# Patient Record
Sex: Female | Born: 1950 | Race: White | Hispanic: No | Marital: Single | Smoking: Current every day smoker
Health system: Southern US, Community
[De-identification: ages and names within clinical notes are randomized; demographics above are authoritative.]

## PROBLEM LIST (undated history)

## (undated) DIAGNOSIS — E785 Hyperlipidemia, unspecified: Secondary | ICD-10-CM

## (undated) DIAGNOSIS — C801 Malignant (primary) neoplasm, unspecified: Secondary | ICD-10-CM

## (undated) DIAGNOSIS — M1990 Unspecified osteoarthritis, unspecified site: Secondary | ICD-10-CM

## (undated) DIAGNOSIS — M8580 Other specified disorders of bone density and structure, unspecified site: Secondary | ICD-10-CM

## (undated) DIAGNOSIS — Z9889 Other specified postprocedural states: Secondary | ICD-10-CM

## (undated) DIAGNOSIS — T8859XA Other complications of anesthesia, initial encounter: Secondary | ICD-10-CM

## (undated) DIAGNOSIS — I10 Essential (primary) hypertension: Secondary | ICD-10-CM

## (undated) DIAGNOSIS — R112 Nausea with vomiting, unspecified: Secondary | ICD-10-CM

## (undated) DIAGNOSIS — T7840XA Allergy, unspecified, initial encounter: Secondary | ICD-10-CM

## (undated) HISTORY — DX: Hyperlipidemia, unspecified: E78.5

## (undated) HISTORY — PX: TONSILLECTOMY: SUR1361

## (undated) HISTORY — PX: APPENDECTOMY: SHX54

## (undated) HISTORY — DX: Unspecified osteoarthritis, unspecified site: M19.90

## (undated) HISTORY — DX: Other specified disorders of bone density and structure, unspecified site: M85.80

## (undated) HISTORY — DX: Allergy, unspecified, initial encounter: T78.40XA

## (undated) HISTORY — PX: HERNIA REPAIR: SHX51

---

## 1999-03-23 ENCOUNTER — Ambulatory Visit (HOSPITAL_BASED_OUTPATIENT_CLINIC_OR_DEPARTMENT_OTHER): Admission: RE | Admit: 1999-03-23 | Discharge: 1999-03-23 | Payer: Self-pay | Admitting: General Surgery

## 2010-03-25 IMAGING — CR DG ABDOMEN ACUTE W/ 1V CHEST
3 series · 3 of 3 positions shown · non-contrast
Comparison: None.

CLINICAL DATA: Abdominal pain.

ACUTE ABDOMEN SERIES (ABDOMEN 2 VIEW & CHEST 1 VIEW)

[view not recorded (1 of 3)]
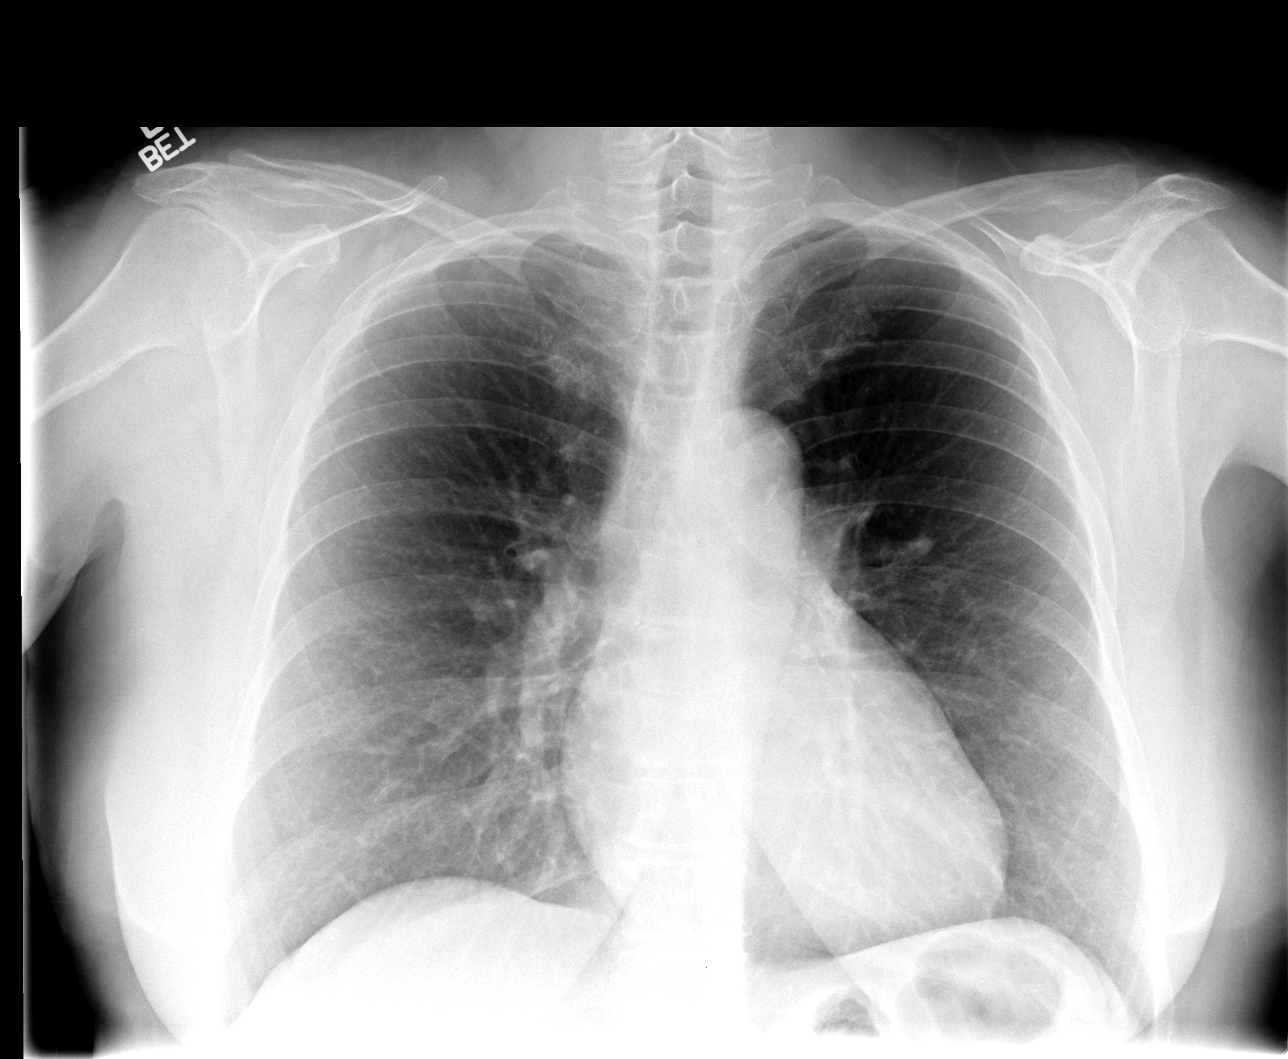

[view not recorded (2 of 3)]
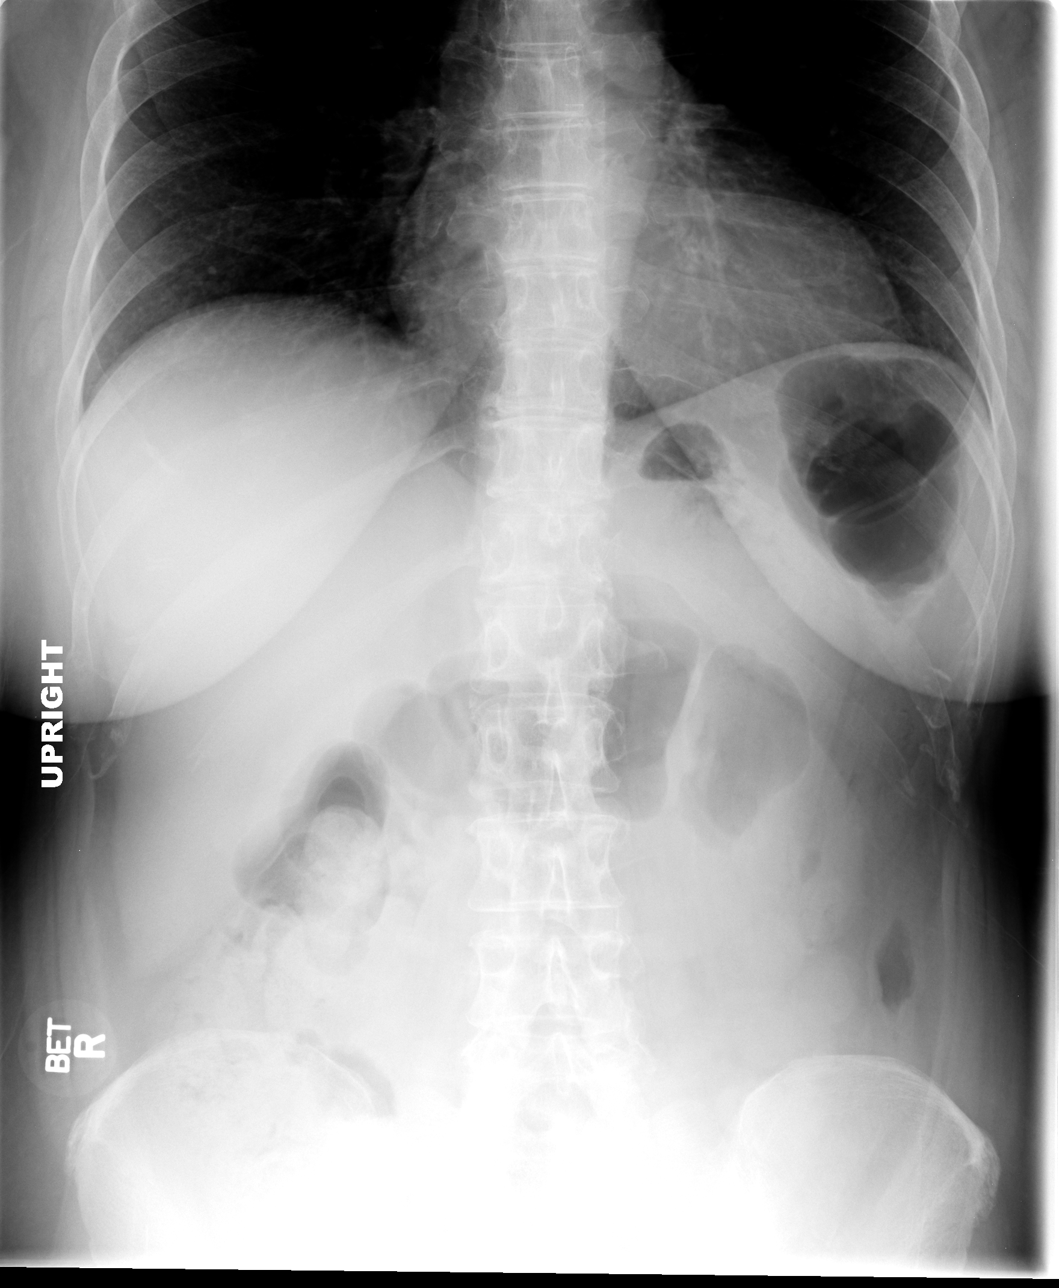

[view not recorded (3 of 3)]
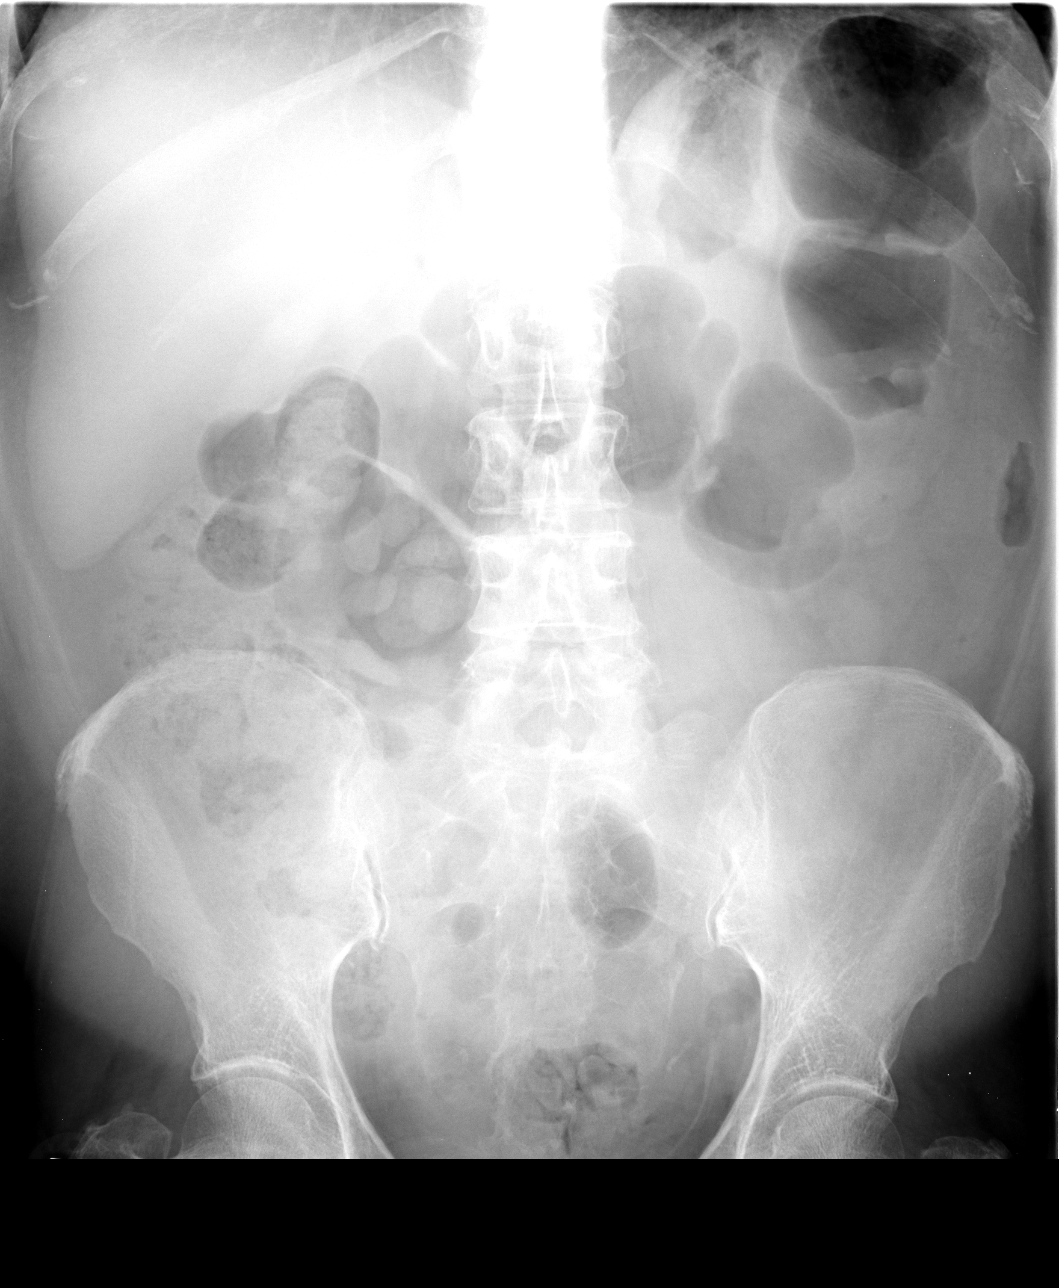

[3 of 3 positions shown; findings below may reference images not displayed]

FINDINGS: The cardiopericardial silhouette is normal in size.  The
lungs are clear.

There is mild gaseous distention of the transverse colon.  Stool
and gas can be seen throughout the colon to the level of the
rectum.  There is no evidence for obstruction or free air.  The
axial skeleton demonstrates mild degenerative changes in the lower
lumbar spine.
IMPRESSION: No acute abnormality.

## 2010-03-26 IMAGING — CR DG CHEST 1V
1 series · 1 of 1 positions shown · non-contrast
Comparison: PA view the chest from the acute abdominal series [3T]
hours on [DATE].

CLINICAL DATA: 58-year-old female preoperative study.

CHEST - 1 VIEW

[view not recorded]
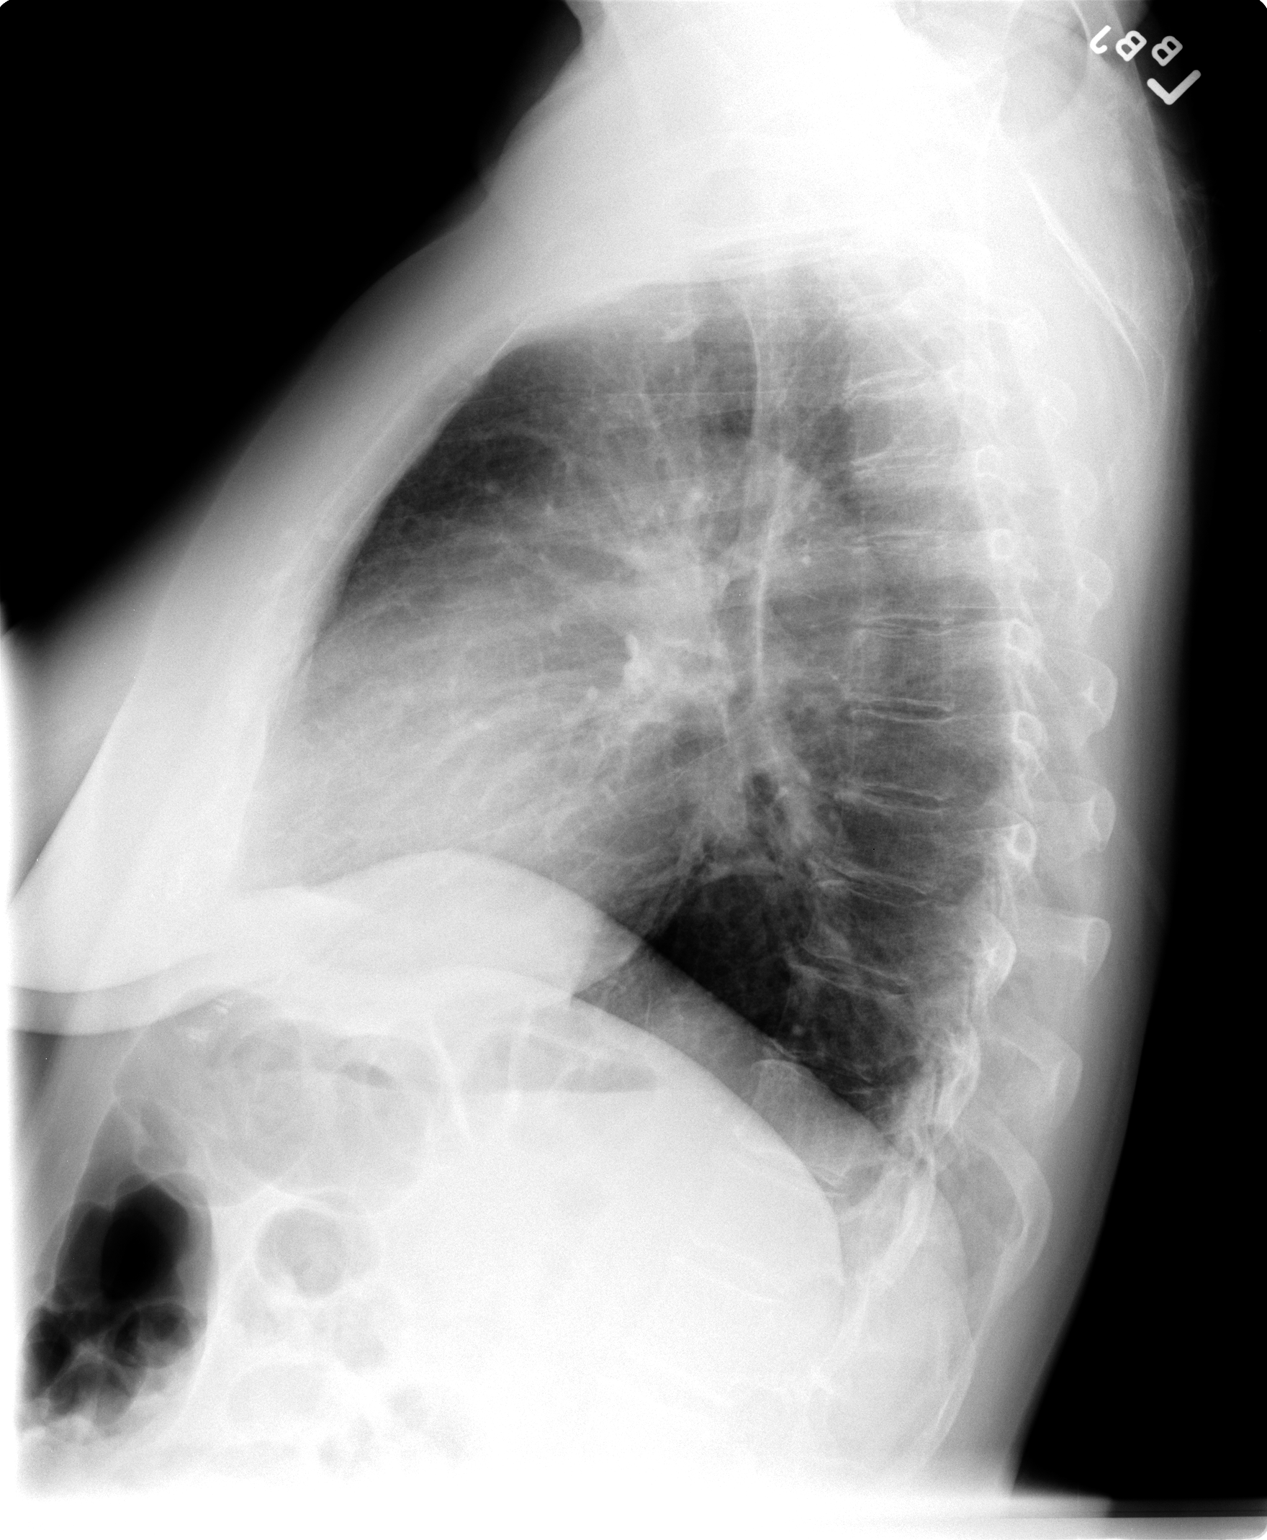

[1 of 1 positions shown; findings below may reference images not displayed]

FINDINGS: Cardiac size and mediastinal contours are within normal
limits.  Normal lung volumes. Visualized tracheal air column is
within normal limits.  The lungs are clear.  No pneumoperitoneum.
No acute osseous abnormality identified.
IMPRESSION: Negative, no acute cardiopulmonary abnormality.

## 2010-03-26 IMAGING — CT CT ABD-PELV W/ CM
2 of 5 series · 16 of 46 positions shown, 18 images · IV contrast (Omnipaque 300)
Comparison: Acute abdominal series [DATE].

CLINICAL DATA: Abdominal pain.

CT ABDOMEN AND PELVIS WITH CONTRAST
TECHNIQUE: Multidetector CT imaging of the abdomen and pelvis was
performed following the standard protocol during bolus
administration of intravenous contrast.
Contrast: 100 ml Omnipaque 300

[Series 2: abd_pel_with 5.0 b40f · axial · 0.74mm/px · z∈[-466,-70]mm · 13 of 91 slices shown, 15 images]
[im 6/91  soft-tissue]
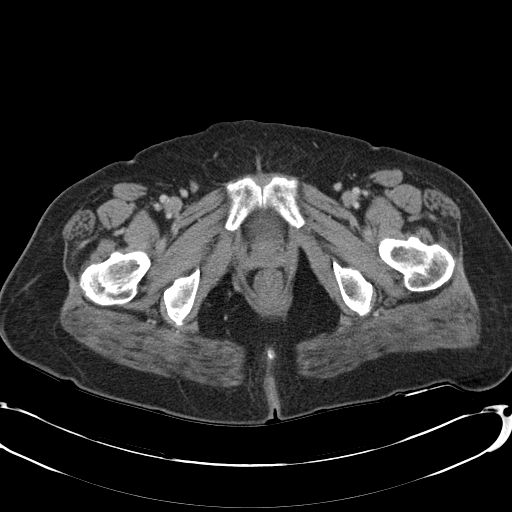
[im 6/91  bone]
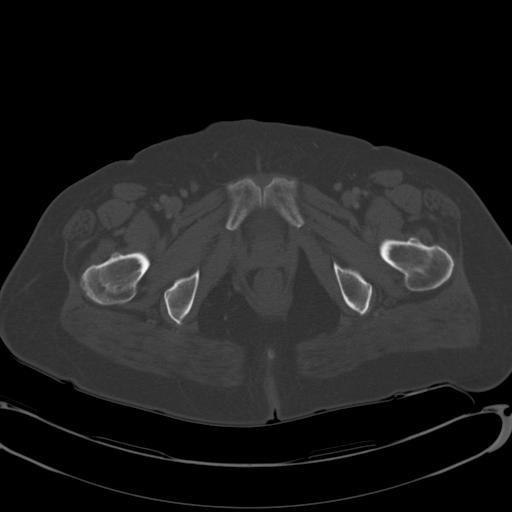
[im 11/91  soft-tissue]
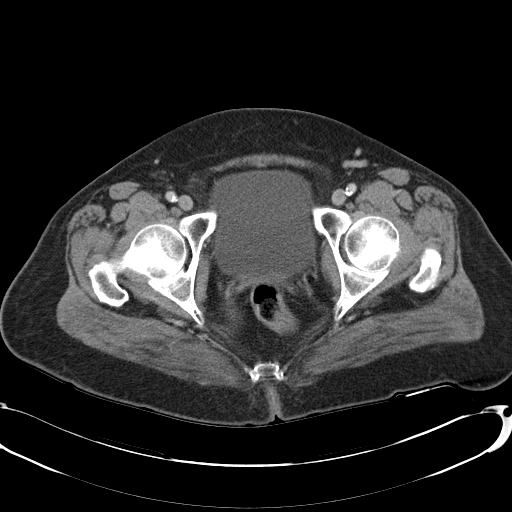
[im 22/91  soft-tissue]
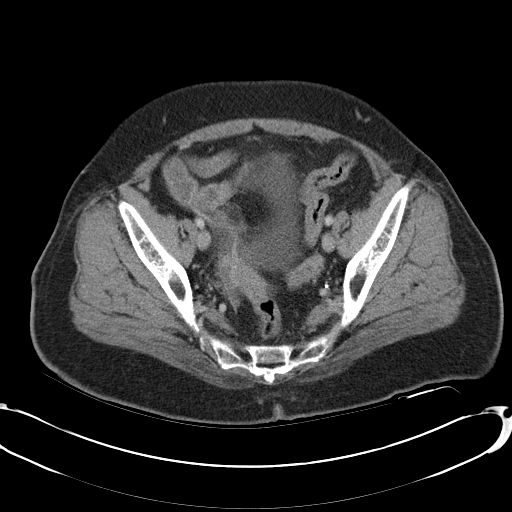
[im 27/91  soft-tissue]
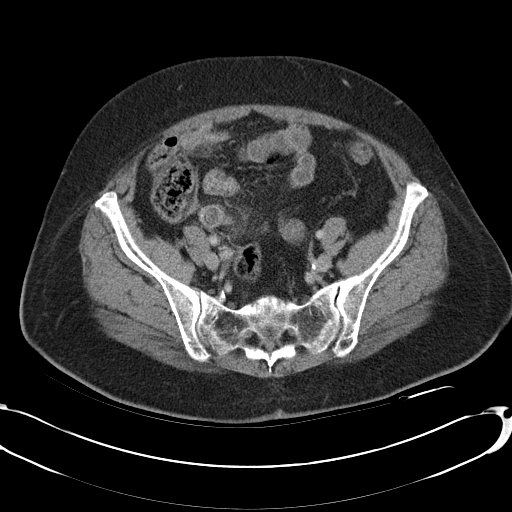
[im 32/91  soft-tissue]
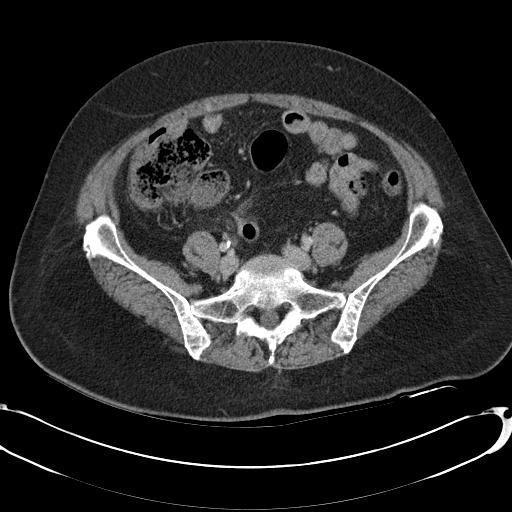
[im 38/91  soft-tissue]
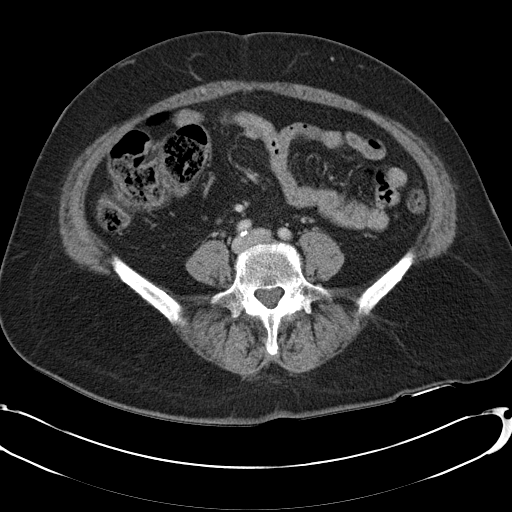
[im 48/91  soft-tissue]
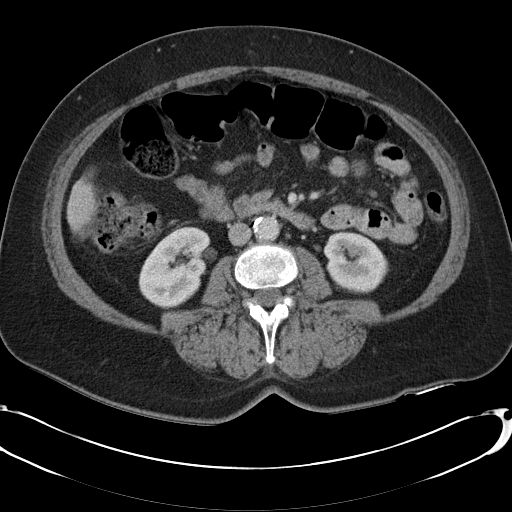
[im 53/91  soft-tissue]
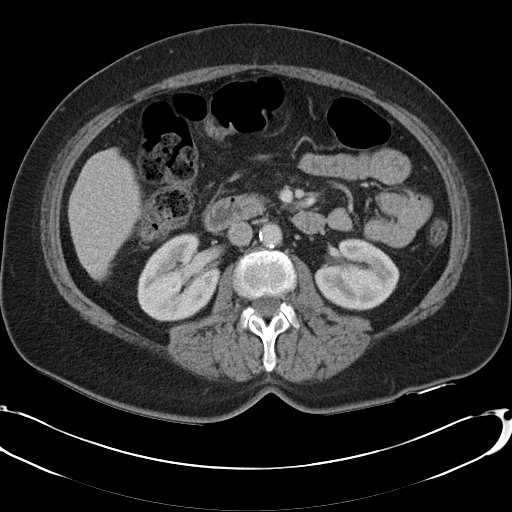
[im 59/91  soft-tissue]
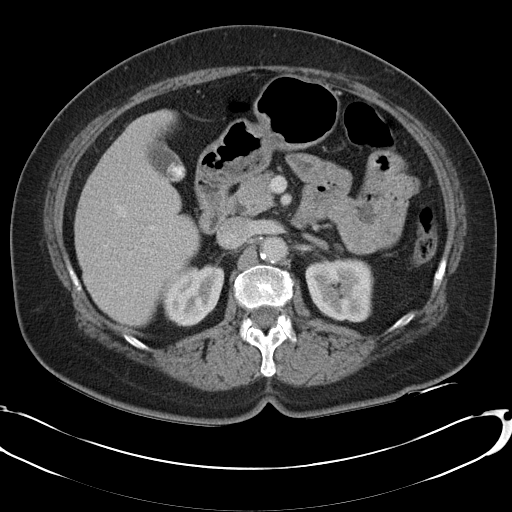
[im 59/91  bone]
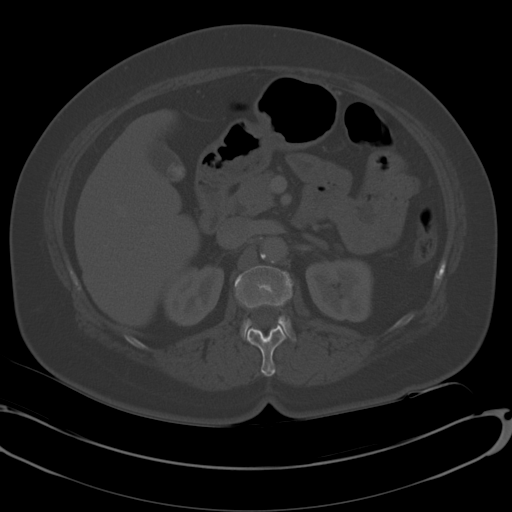
[im 64/91  soft-tissue]
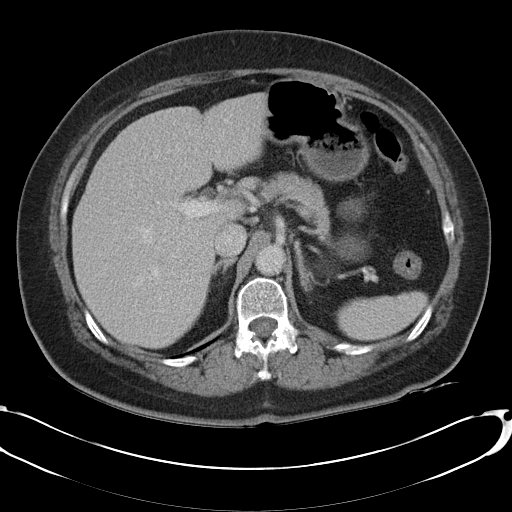
[im 69/91  soft-tissue]
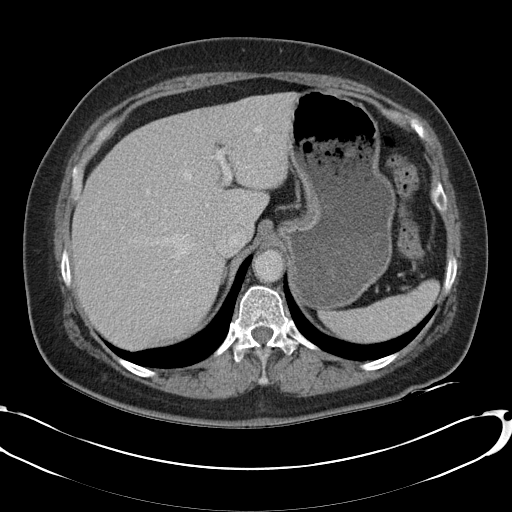
[im 80/91  soft-tissue]
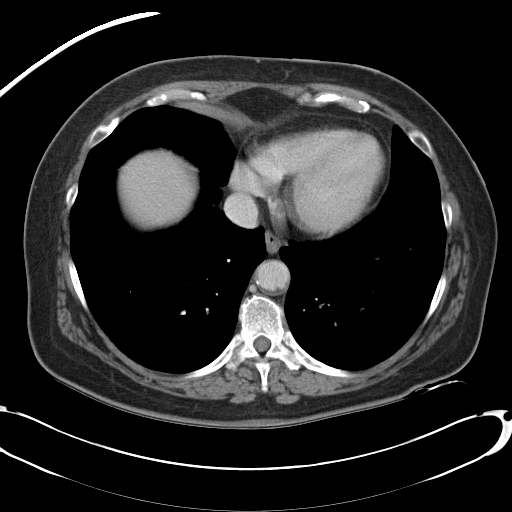
[im 85/91  soft-tissue]
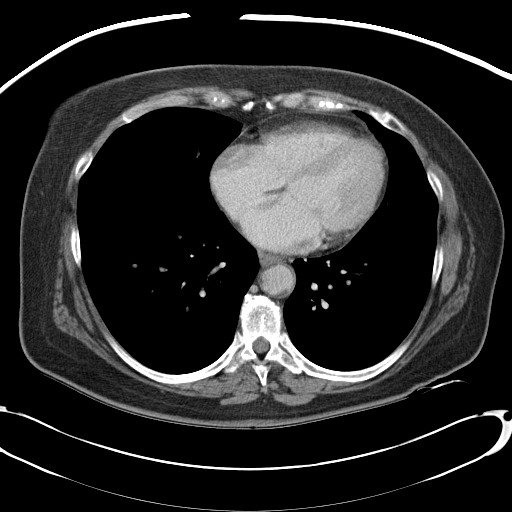

[Series 4: mpr cor post contrast (id) · coronal · 0.77mm/px · 3 of 88 slices shown]
[im 30/88  soft-tissue]
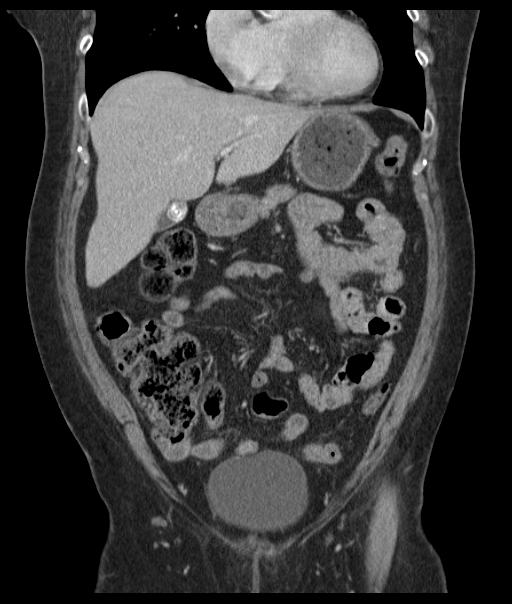
[im 39/88  soft-tissue]
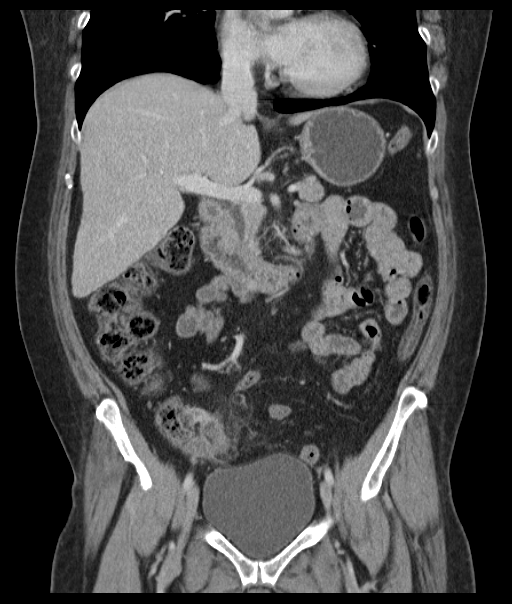
[im 49/88  soft-tissue]
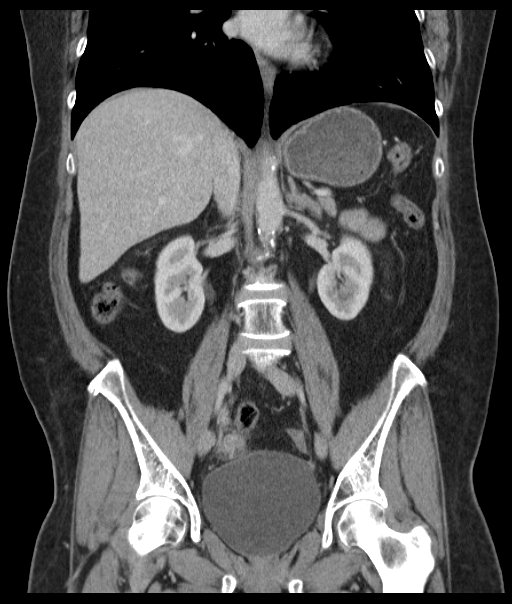

[16 of 46 positions shown; findings below may reference images not displayed]

FINDINGS: Mild dependent atelectasis is present bilaterally.  No
other focal nodule, mass, or airspace disease is present.  The
heart size is normal.  No significant pleural or pericardial
effusion is present.

The infused appearance of the liver and spleen is normal.  The
stomach, duodenum, and pancreas are within normal limits.  The
common bile duct is unremarkable.  A large gallstone at the neck of
the gallbladder measures 14 mm.  No inflammatory changes are
evident.  The adrenal glands and kidneys are normal bilaterally.
Atherosclerotic calcifications are noted within the aorta without
aneurysm.

The rectosigmoid colon is partially collapsed, but within normal
limits.  The transverse and ascending colon are normal.  The
appendix is markedly enlarged, measuring up to 16 mm.  A distal
appendicolith is present.  Periappendiceal inflammatory changes are
noted.  No free air or free fluid is present.  The patient is
status post hysterectomy.  The adnexa are normal for age.  The bone
windows are unremarkable.
IMPRESSION: 1.  Acute appendicitis with marked enlargement of the appendix and
periappendiceal inflammatory change.
2.  Atherosclerosis.
3.  Cholelithiasis without evidence for cholecystitis.

## 2010-10-28 ENCOUNTER — Observation Stay (HOSPITAL_COMMUNITY): Admission: EM | Admit: 2010-10-28 | Discharge: 2010-03-26 | Payer: Self-pay | Admitting: Emergency Medicine

## 2011-02-08 LAB — DIFFERENTIAL
Basophils Absolute: 0.1 10*3/uL (ref 0.0–0.1)
Basophils Relative: 1 % (ref 0–1)
Eosinophils Absolute: 0.1 10*3/uL (ref 0.0–0.7)
Eosinophils Relative: 1 % (ref 0–5)
Lymphocytes Relative: 27 % (ref 12–46)
Lymphs Abs: 3.4 10*3/uL (ref 0.7–4.0)
Monocytes Absolute: 1 10*3/uL (ref 0.1–1.0)
Monocytes Relative: 8 % (ref 3–12)
Neutro Abs: 8 10*3/uL — ABNORMAL HIGH (ref 1.7–7.7)
Neutrophils Relative %: 64 % (ref 43–77)

## 2011-02-08 LAB — COMPREHENSIVE METABOLIC PANEL
ALT: 12 U/L (ref 0–35)
AST: 12 U/L (ref 0–37)
Albumin: 3.8 g/dL (ref 3.5–5.2)
Alkaline Phosphatase: 94 U/L (ref 39–117)
BUN: 9 mg/dL (ref 6–23)
CO2: 28 mEq/L (ref 19–32)
Calcium: 9 mg/dL (ref 8.4–10.5)
Chloride: 105 mEq/L (ref 96–112)
Creatinine, Ser: 0.81 mg/dL (ref 0.4–1.2)
GFR calc Af Amer: >60 mL/min (ref >60)
GFR calc non Af Amer: >60 mL/min (ref >60)
Glucose, Bld: 108 mg/dL — ABNORMAL HIGH (ref 70–99)
Potassium: 3.5 mEq/L (ref 3.5–5.1)
Sodium: 139 mEq/L (ref 135–145)
Total Bilirubin: 0.8 mg/dL (ref 0.3–1.2)
Total Protein: 7.2 g/dL (ref 6.0–8.3)

## 2011-02-08 LAB — CBC
HCT: 39.2 % (ref 36.0–46.0)
Hemoglobin: 13.9 g/dL (ref 12.0–15.0)
MCHC: 35.5 g/dL (ref 30.0–36.0)
MCV: 92.6 fL (ref 78.0–100.0)
Platelets: 343 10*3/uL (ref 150–400)
RBC: 4.23 MIL/uL (ref 3.87–5.11)
RDW: 13.2 % (ref 11.5–15.5)
WBC: 12.6 10*3/uL — ABNORMAL HIGH (ref 4.0–10.5)

## 2011-02-08 LAB — URINALYSIS, ROUTINE W REFLEX MICROSCOPIC
Bilirubin Urine: NEGATIVE
Glucose, UA: NEGATIVE mg/dL
Ketones, ur: NEGATIVE mg/dL
Leukocytes, UA: NEGATIVE
Nitrite: NEGATIVE
Protein, ur: NEGATIVE mg/dL
Specific Gravity, Urine: 1.025 (ref 1.005–1.030)
Urobilinogen, UA: 0.2 mg/dL (ref 0.0–1.0)
pH: 6 (ref 5.0–8.0)

## 2011-02-08 LAB — APTT: aPTT: 28 seconds (ref 24–37)

## 2011-02-08 LAB — POCT CARDIAC MARKERS
CKMB, poc: <1 ng/mL — ABNORMAL LOW (ref 1.0–8.0)
Myoglobin, poc: 49.1 ng/mL (ref 12–200)
Troponin i, poc: <0.05 ng/mL (ref 0.00–0.09)

## 2011-02-08 LAB — ETHANOL: Alcohol, Ethyl (B): <5 mg/dL (ref 0–10)

## 2011-02-08 LAB — URINE CULTURE
Colony Count: NO GROWTH
Culture: NO GROWTH

## 2011-02-08 LAB — GLUCOSE, CAPILLARY: Glucose-Capillary: 128 mg/dL — ABNORMAL HIGH (ref 70–99)

## 2011-02-08 LAB — URINE MICROSCOPIC-ADD ON

## 2011-02-08 LAB — LIPASE, BLOOD: Lipase: 16 U/L (ref 11–59)

## 2011-02-08 LAB — LACTIC ACID, PLASMA: Lactic Acid, Venous: 1.1 mmol/L (ref 0.5–2.2)

## 2013-02-22 ENCOUNTER — Encounter: Payer: Self-pay | Admitting: Nurse Practitioner

## 2013-02-22 ENCOUNTER — Ambulatory Visit (INDEPENDENT_AMBULATORY_CARE_PROVIDER_SITE_OTHER): Payer: BC Managed Care – PPO | Admitting: Nurse Practitioner

## 2013-02-22 VITALS — BP 144/80 | HR 64 | Temp 96.7°F | Ht 63.0 in | Wt 170.0 lb

## 2013-02-22 DIAGNOSIS — E785 Hyperlipidemia, unspecified: Secondary | ICD-10-CM

## 2013-02-22 LAB — COMPLETE METABOLIC PANEL WITH GFR
ALT: 15 U/L (ref 0–35)
AST: 14 U/L (ref 0–37)
Albumin: 4.6 g/dL (ref 3.5–5.2)
Alkaline Phosphatase: 127 U/L — ABNORMAL HIGH (ref 39–117)
BUN: 8 mg/dL (ref 6–23)
CO2: 29 mEq/L (ref 19–32)
Calcium: 10.2 mg/dL (ref 8.4–10.5)
Chloride: 103 mEq/L (ref 96–112)
Creat: 0.74 mg/dL (ref 0.50–1.10)
GFR, Est African American: >89 mL/min
GFR, Est Non African American: 88 mL/min
Glucose, Bld: 94 mg/dL (ref 70–99)
Potassium: 4.7 mEq/L (ref 3.5–5.3)
Sodium: 140 mEq/L (ref 135–145)
Total Bilirubin: 1.1 mg/dL (ref 0.3–1.2)
Total Protein: 7.5 g/dL (ref 6.0–8.3)

## 2013-02-22 MED ORDER — ATORVASTATIN CALCIUM 40 MG PO TABS: 40.0000 mg | ORAL_TABLET | Freq: Every day | ORAL | Status: DC

## 2013-02-22 NOTE — Patient Instructions (Signed)

## 2013-02-22 NOTE — Progress Notes (Signed)
  Subjective:    Patient ID: Elizabeth Wu, female    DOB: 06-14-1951, 62 y.o.   MRN: [id number]  Hyperlipidemia This is a chronic problem. The current episode started more than 1 year ago. The problem is uncontrolled. Recent lipid tests were reviewed and are high. She has no history of diabetes, hypothyroidism or liver disease. Factors aggravating her hyperlipidemia include smoking and fatty foods. Pertinent negatives include no chest pain, leg pain or shortness of breath. She is currently on no antihyperlipidemic treatment (Pt ran out of samples of meds and have not refilled). The current treatment provides mild improvement of lipids. Compliance problems include adherence to diet, medication cost and adherence to exercise.  Risk factors for coronary artery disease include a sedentary lifestyle, post-menopausal, family history and dyslipidemia.  *Pt was on Liptor but was changed to [facility] which was too expensive. Then changed to liptrucet samples but has ran out.      Review of Systems  Constitutional: Negative.   Respiratory: Negative.  Negative for shortness of breath.   Cardiovascular: Negative for chest pain.  All other systems reviewed and are negative.       Objective:   Physical Exam  Constitutional: She is oriented to person, place, and time. She appears well-developed and well-nourished.  HENT:  Nose: Nose normal.  Mouth/Throat: Oropharynx is clear and moist.  Eyes: EOM are normal.  Neck: Trachea normal, normal range of motion and full passive range of motion without pain. Neck supple. No JVD present. Carotid bruit is not present. No thyromegaly present.  Cardiovascular: Normal rate, regular rhythm, normal heart sounds and intact distal pulses.  Exam reveals no gallop and no friction rub.   No murmur heard. Pulmonary/Chest: Effort normal and breath sounds normal.  Abdominal: Soft. Bowel sounds are normal. She exhibits no distension and no mass. There is no tenderness.   Musculoskeletal: Normal range of motion.  Lymphadenopathy:    She has no cervical adenopathy.  Neurological: She is alert and oriented to person, place, and time. She has normal reflexes.  Skin: Skin is warm and dry.  Psychiatric: She has a normal mood and affect. Her behavior is normal. Judgment and thought content normal.    BP 144/80  Pulse 64  Temp(Src) 96.7 F (35.9 C) (Oral)  Ht 5\' 3"  (1.6 m)  Wt 170 lb (77.111 kg)  BMI 30.12 kg/m2       Assessment & Plan:  1. Other and unspecified hyperlipidemia Encourage low fat diet and exercise - COMPLETE METABOLIC PANEL WITH GFR - NMR Lipoprofile with Lipids - atorvastatin (LIPITOR) 40 MG tablet; Take 1 tablet (40 mg total) by mouth daily.  Dispense: 30 tablet; Refill: 5  Pt given hemocult cards and health maintence reviewed  Mary-Margaret Daphine Deutscher, FNP

## 2013-02-25 ENCOUNTER — Other Ambulatory Visit: Payer: Self-pay | Admitting: Nurse Practitioner

## 2013-02-25 LAB — NMR LIPOPROFILE WITH LIPIDS
Cholesterol, Total: 260 mg/dL — ABNORMAL HIGH (ref <200)
HDL Particle Number: 25.7 umol/L — ABNORMAL LOW (ref >=30.5)
HDL Size: 8.8 nm — ABNORMAL LOW (ref >=9.2)
HDL-C: 47 mg/dL (ref >=40)
LDL (calc): 173 mg/dL — ABNORMAL HIGH (ref <100)
LDL Particle Number: 2209 nmol/L — ABNORMAL HIGH (ref <1000)
LDL Size: 20.6 nm (ref >20.5)
LP-IR Score: 64 — ABNORMAL HIGH (ref <=45)
Large HDL-P: 3.1 umol/L — ABNORMAL LOW (ref >=4.8)
Large VLDL-P: 4.4 nmol/L — ABNORMAL HIGH (ref <=2.7)
Small LDL Particle Number: 951 nmol/L — ABNORMAL HIGH (ref <=527)
Triglycerides: 198 mg/dL — ABNORMAL HIGH (ref <150)
VLDL Size: 48.6 nm — ABNORMAL HIGH (ref <=46.6)

## 2013-02-25 MED ORDER — EZETIMIBE 10 MG PO TABS: 10.0000 mg | ORAL_TABLET | Freq: Every day | ORAL | Status: DC

## 2013-02-25 NOTE — Progress Notes (Signed)
Already addressed and told patient to stay on Lipitor. If you have already spoke with patient then just close this!

## 2013-02-25 NOTE — Progress Notes (Signed)
But wants something else that cost to much

## 2013-02-26 ENCOUNTER — Telehealth: Payer: Self-pay | Admitting: Nurse Practitioner

## 2013-03-27 NOTE — Telephone Encounter (Signed)
Message left by nurse

## 2013-04-25 ENCOUNTER — Ambulatory Visit (INDEPENDENT_AMBULATORY_CARE_PROVIDER_SITE_OTHER): Payer: BC Managed Care – PPO | Admitting: Nurse Practitioner

## 2013-04-25 VITALS — BP 127/84 | HR 87 | Temp 98.9°F | Ht 63.0 in | Wt 174.0 lb

## 2013-04-25 DIAGNOSIS — H663X1 Other chronic suppurative otitis media, right ear: Secondary | ICD-10-CM

## 2013-04-25 DIAGNOSIS — H663X9 Other chronic suppurative otitis media, unspecified ear: Secondary | ICD-10-CM

## 2013-04-25 MED ORDER — FLUTICASONE PROPIONATE 50 MCG/ACT NA SUSP: 2.0000 | Freq: Every day | NASAL | Status: DC

## 2013-04-25 NOTE — Patient Instructions (Signed)
Otitis Media with Effusion Otitis media with effusion is the presence of fluid in the middle ear. This is a common problem that often follows ear infections. It may be present for weeks or longer after the infection. Unlike an acute ear infection, otits media with effusion refers only to fluid behind the ear drum and not infection. Children with repeated ear and sinus infections and allergy problems are the most likely to get otitis media with effusion. CAUSES  The most frequent cause of the fluid buildup is dysfunction of the eustacian tubes. These are the tubes that drain fluid in the ears to the throat. SYMPTOMS   The main symptom of this condition is hearing loss. As a result, you or your child may:  Listen to the TV at a loud volume.  Not respond to questions.  Ask "what" often when spoken to.  There may be a sensation of fullness or pressure but usually not pain. DIAGNOSIS   Your caregiver will diagnose this condition by examining you or your child's ears.  Your caregiver may test the pressure in you or your child's ear with a tympanometer.  A hearing test may be conducted if the problem persists.  A caregiver will want to re-evaluate the condition periodically to see if it improves. TREATMENT   Treatment depends on the duration and the effects of the effusion.  Antibiotics, decongestants, nose drops, and cortisone-type drugs may not be helpful.  Children with persistent ear effusions may have delayed language. Children at risk for developmental delays in hearing, learning, and speech may require referral to a specialist earlier than children not at risk.  You or your child's caregiver may suggest a referral to an Ear, Nose, and Throat (ENT) surgeon for treatment. The following may help restore normal hearing:  Drainage of fluid.  Placement of ear tubes (tympanostomy tubes).  Removal of adenoids (adenoidectomy). HOME CARE INSTRUCTIONS   Avoid second hand  smoke.  Infants who are breast fed are less likely to have this condition.  Avoid feeding infants while laying flat.  Avoid known environmental allergens.  Be sure to see a caregiver or an ENT specialist for follow up.  Avoid people who are sick. SEEK MEDICAL CARE IF:   Hearing is not better in 3 months.  Hearing is worse.  Ear pain.  Drainage from the ear.  Dizziness. Document Released: 12/15/2004 Document Revised: 01/30/2012 Document Reviewed: 03/30/2010 [employer] Patient Information 2014 [employer].  

## 2013-04-25 NOTE — Progress Notes (Signed)
  Subjective:    Patient ID: Elizabeth Wu, female    DOB: [birth year], 62 y.o.   MRN: [id number]  HPI  Patient in C/O right ear pain- started about 3 days ago- has gotten worse- No OTC meds have been tried.    Review of Systems  Constitutional: Negative for fever.  HENT: Positive for ear pain. Negative for hearing loss, congestion, sore throat, rhinorrhea, sneezing, postnasal drip, sinus pressure and ear discharge.   Eyes: Negative.   Respiratory: Negative.   Cardiovascular: Negative.   Gastrointestinal: Negative.   Psychiatric/Behavioral: Negative.        Objective:   Physical Exam  Constitutional: She appears well-developed and well-nourished.  HENT:  Right Ear: Hearing, external ear and ear canal normal. Tympanic membrane is not erythematous. A middle ear effusion is present.  Left Ear: Hearing, external ear and ear canal normal. Tympanic membrane is not erythematous.  No middle ear effusion.  Nose: Mucosal edema present. No rhinorrhea.  Mouth/Throat: Posterior oropharyngeal erythema (mild) present. No posterior oropharyngeal edema.  Cardiovascular: Normal rate and normal heart sounds.   Pulmonary/Chest: Effort normal and breath sounds normal.     BP 127/84  Pulse 87  Temp(Src) 98.9 F (37.2 C) (Oral)  Ht 5\' 3"  (1.6 m)  Wt 174 lb (78.926 kg)  BMI 30.83 kg/m2      Assessment & Plan:   1. Chronic suppurative OM, right    Meds ordered this encounter  Medications  . fluticasone (FLONASE) 50 MCG/ACT nasal spray    Sig: Place 2 sprays into the nose daily.    Dispense:  16 g    Refill:  6    Order Specific Question:  Supervising Provider    Answer:  Ernestina Penna [[id number]]   Force fluids Do not stick anything in ear RTOprn  Mary-Margaret Daphine Deutscher, FNP

## 2013-05-31 ENCOUNTER — Ambulatory Visit (INDEPENDENT_AMBULATORY_CARE_PROVIDER_SITE_OTHER): Payer: BC Managed Care – PPO | Admitting: Nurse Practitioner

## 2013-05-31 ENCOUNTER — Encounter: Payer: Self-pay | Admitting: Nurse Practitioner

## 2013-05-31 VITALS — BP 107/58 | HR 67 | Temp 97.7°F | Ht 63.0 in | Wt 177.0 lb

## 2013-05-31 DIAGNOSIS — M8589 Other specified disorders of bone density and structure, multiple sites: Secondary | ICD-10-CM | POA: Insufficient documentation

## 2013-05-31 DIAGNOSIS — E785 Hyperlipidemia, unspecified: Secondary | ICD-10-CM

## 2013-05-31 DIAGNOSIS — M1990 Unspecified osteoarthritis, unspecified site: Secondary | ICD-10-CM

## 2013-05-31 DIAGNOSIS — M899 Disorder of bone, unspecified: Secondary | ICD-10-CM

## 2013-05-31 DIAGNOSIS — M8580 Other specified disorders of bone density and structure, unspecified site: Secondary | ICD-10-CM

## 2013-05-31 DIAGNOSIS — M949 Disorder of cartilage, unspecified: Secondary | ICD-10-CM

## 2013-05-31 LAB — COMPLETE METABOLIC PANEL WITH GFR
ALT: 14 U/L (ref 0–35)
AST: 12 U/L (ref 0–37)
Albumin: 4.5 g/dL (ref 3.5–5.2)
Alkaline Phosphatase: 116 U/L (ref 39–117)
BUN: 9 mg/dL (ref 6–23)
CO2: 28 mEq/L (ref 19–32)
Calcium: 9.7 mg/dL (ref 8.4–10.5)
Chloride: 105 mEq/L (ref 96–112)
Creat: 0.72 mg/dL (ref 0.50–1.10)
GFR, Est African American: >89 mL/min
GFR, Est Non African American: >89 mL/min
Glucose, Bld: 98 mg/dL (ref 70–99)
Potassium: 5 mEq/L (ref 3.5–5.3)
Sodium: 140 mEq/L (ref 135–145)
Total Bilirubin: 0.9 mg/dL (ref 0.3–1.2)
Total Protein: 7.2 g/dL (ref 6.0–8.3)

## 2013-05-31 MED ORDER — ATORVASTATIN CALCIUM 40 MG PO TABS: 40.0000 mg | ORAL_TABLET | Freq: Every day | ORAL | Status: DC

## 2013-05-31 NOTE — Patient Instructions (Addendum)

## 2013-05-31 NOTE — Progress Notes (Signed)
  Subjective:    Patient ID: Elizabeth Wu, female    DOB: 04/04/1951, 62 y.o.   MRN: [id number]  Hyperlipidemia This is a chronic problem. The current episode started more than 1 year ago. The problem is controlled. Recent lipid tests were reviewed and are normal. She has no history of diabetes. Pertinent negatives include no leg pain, myalgias or shortness of breath. Current antihyperlipidemic treatment includes statins. The current treatment provides moderate improvement of lipids. Risk factors for coronary artery disease include dyslipidemia and post-menopausal.      Review of Systems  HENT: Positive for ear pain (right ear pain).   Respiratory: Negative for shortness of breath.   Musculoskeletal: Negative for myalgias.  All other systems reviewed and are negative.       Objective:   Physical Exam  Constitutional: She is oriented to person, place, and time. She appears well-developed and well-nourished.  HENT:  Head: Normocephalic.  Right Ear: External ear normal.  Left Ear: External ear normal.  Eyes: Pupils are equal, round, and reactive to light.  Neck: Normal range of motion. Neck supple. No thyromegaly present.  Cardiovascular: Normal rate, regular rhythm, normal heart sounds and intact distal pulses.   Pulmonary/Chest: Effort normal and breath sounds normal.  Abdominal: Soft. Bowel sounds are normal. There is no tenderness. There is no rebound.  Musculoskeletal: Normal range of motion. She exhibits no edema.  Neurological: She is alert and oriented to person, place, and time.  Skin: Skin is warm and dry.  Psychiatric: She has a normal mood and affect. Her behavior is normal. Judgment and thought content normal.      BP 107/58  Pulse 67  Temp(Src) 97.7 F (36.5 C) (Oral)  Ht 5\' 3"  (1.6 m)  Wt 177 lb (80.287 kg)  BMI 31.36 kg/m2     Assessment & Plan:   1. Hyperlipidemia   2. Osteopenia   3. DJD (degenerative joint disease)    Orders Placed This Encounter   Procedures  . DG Bone Density    Standing Status: Future     Number of Occurrences:      Standing Expiration Date: 07/31/2014    Order Specific Question:  Reason for Exam (SYMPTOM  OR DIAGNOSIS REQUIRED)    Answer:  osteopenia    Order Specific Question:  Preferred imaging location?    Answer:  Internal  . COMPLETE METABOLIC PANEL WITH GFR  . NMR Lipoprofile with Lipids   Meds ordered this encounter  Medications  . atorvastatin (LIPITOR) 40 MG tablet    Sig: Take 1 tablet (40 mg total) by mouth daily.    Dispense:  30 tablet    Refill:  5    Order Specific Question:  Supervising Provider    Answer:  Deborra Medina   Diet and exercise encouraged Labs pending Hemocult cards given to patient Will schedule for dexascan rto in 3-6 months follow-up  Mary-Margaret Daphine Deutscher, FNP

## 2013-06-04 ENCOUNTER — Other Ambulatory Visit: Payer: Self-pay | Admitting: Nurse Practitioner

## 2013-06-04 LAB — NMR LIPOPROFILE WITH LIPIDS
Cholesterol, Total: 266 mg/dL — ABNORMAL HIGH (ref <200)
HDL Particle Number: 27.5 umol/L — ABNORMAL LOW (ref >=30.5)
HDL Size: 8.8 nm — ABNORMAL LOW (ref >=9.2)
HDL-C: 42 mg/dL (ref >=40)
LDL (calc): 189 mg/dL — ABNORMAL HIGH (ref <100)
LDL Particle Number: 2957 nmol/L — ABNORMAL HIGH (ref <1000)
LDL Size: 20.5 nm — ABNORMAL LOW (ref >20.5)
LP-IR Score: 61 — ABNORMAL HIGH (ref <=45)
Large HDL-P: 4.2 umol/L — ABNORMAL LOW (ref >=4.8)
Large VLDL-P: 3.3 nmol/L — ABNORMAL HIGH (ref <=2.7)
Small LDL Particle Number: 1829 nmol/L — ABNORMAL HIGH (ref <=527)
Triglycerides: 177 mg/dL — ABNORMAL HIGH (ref <150)
VLDL Size: 46.3 nm (ref <=46.6)

## 2013-06-04 MED ORDER — EZETIMIBE 10 MG PO TABS: 10.0000 mg | ORAL_TABLET | Freq: Every day | ORAL | Status: DC

## 2013-06-07 ENCOUNTER — Telehealth: Payer: Self-pay | Admitting: Nurse Practitioner

## 2013-06-10 ENCOUNTER — Telehealth: Payer: Self-pay | Admitting: Nurse Practitioner

## 2013-06-11 NOTE — Telephone Encounter (Signed)
Called to tell pt,    Pharmacy called to say refills were ready.

## 2013-07-11 ENCOUNTER — Telehealth: Payer: Self-pay | Admitting: Nurse Practitioner

## 2013-07-11 ENCOUNTER — Ambulatory Visit (INDEPENDENT_AMBULATORY_CARE_PROVIDER_SITE_OTHER): Payer: BC Managed Care – PPO | Admitting: Family Medicine

## 2013-07-11 ENCOUNTER — Encounter: Payer: Self-pay | Admitting: Family Medicine

## 2013-07-11 VITALS — BP 136/81 | HR 101 | Temp 101.6°F | Ht 63.0 in | Wt 174.4 lb

## 2013-07-11 DIAGNOSIS — J329 Chronic sinusitis, unspecified: Secondary | ICD-10-CM

## 2013-07-11 MED ORDER — METHYLPREDNISOLONE (PAK) 4 MG PO TABS: ORAL_TABLET | ORAL | Status: DC

## 2013-07-11 MED ORDER — AMOXICILLIN 875 MG PO TABS: 875.0000 mg | ORAL_TABLET | Freq: Two times a day (BID) | ORAL | Status: DC

## 2013-07-11 NOTE — Progress Notes (Signed)
  Subjective:    Patient ID: Elizabeth Wu, female    DOB: Oct 11, 1951, 62 y.o.   MRN: [id number]  HPI This 62 y.o. female presents for evaluation of URI sx's and fever. She has not felt well in a few days and she is having sinus congestion And fever.   Review of Systems C/o sinus congestion and fever. No chest pain, SOB, HA, dizziness, vision change, N/V, diarrhea, constipation, dysuria, urinary urgency or frequency, myalgias, arthralgias or rash.     Objective:   Physical Exam  Vital signs noted  Well developed well nourished female.  HEENT - Head atraumatic Normocephalic                Eyes - PERRLA, Conjuctiva - clear Sclera- Clear EOMI                Ears - EAC's Wnl TM's Wnl Gross Hearing WNL                Nose - Nares patent                 Throat - oropharanx injected                Face - TTP maxillary sinuses Respiratory - Lungs CTA bilateral Cardiac - RRR S1 and S2 without murmur GI - Abdomen soft Nontender and bowel sounds active x 4 Extremities - No edema. Neuro - Grossly intact.      Assessment & Plan:  Sinusitis - Plan: amoxicillin (AMOXIL) 875 MG tablet, methylPREDNIsolone (MEDROL DOSPACK) 4 MG tablet Push po fluids, rest, tylenol and motrin otc prn as directed.

## 2013-07-11 NOTE — Telephone Encounter (Signed)
Head congestion.   Appt scheduled.  Pt aware.

## 2013-07-11 NOTE — Patient Instructions (Signed)

## 2013-09-16 ENCOUNTER — Encounter: Payer: Self-pay | Admitting: Family Medicine

## 2013-09-16 ENCOUNTER — Ambulatory Visit (INDEPENDENT_AMBULATORY_CARE_PROVIDER_SITE_OTHER): Payer: BC Managed Care – PPO | Admitting: Family Medicine

## 2013-09-16 VITALS — BP 134/69 | HR 71 | Temp 97.9°F | Ht 63.0 in | Wt 175.2 lb

## 2013-09-16 DIAGNOSIS — E785 Hyperlipidemia, unspecified: Secondary | ICD-10-CM

## 2013-09-16 DIAGNOSIS — Z0000 Encounter for general adult medical examination without abnormal findings: Secondary | ICD-10-CM

## 2013-09-16 DIAGNOSIS — J069 Acute upper respiratory infection, unspecified: Secondary | ICD-10-CM

## 2013-09-16 LAB — POCT CBC
Granulocyte percent: 71.8 %G (ref 37–80)
HCT, POC: 40.2 % (ref 37.7–47.9)
Hemoglobin: 13.4 g/dL (ref 12.2–16.2)
Lymph, poc: 3.2 (ref 0.6–3.4)
MCH, POC: 30.2 pg (ref 27–31.2)
MCHC: 33.2 g/dL (ref 31.8–35.4)
MCV: 90.9 fL (ref 80–97)
MPV: 7.6 fL (ref 0–99.8)
POC Granulocyte: 9.9 — AB (ref 2–6.9)
POC LYMPH PERCENT: 23.4 %L (ref 10–50)
Platelet Count, POC: 372 10*3/uL (ref 142–424)
RBC: 4.4 M/uL (ref 4.04–5.48)
RDW, POC: 13.6 %
WBC: 13.8 10*3/uL — AB (ref 4.6–10.2)

## 2013-09-16 LAB — POCT UA - MICROSCOPIC ONLY
Bacteria, U Microscopic: NEGATIVE
Casts, Ur, LPF, POC: NEGATIVE
Crystals, Ur, HPF, POC: NEGATIVE
Mucus, UA: NEGATIVE
Yeast, UA: NEGATIVE

## 2013-09-16 LAB — POCT URINALYSIS DIPSTICK
Bilirubin, UA: NEGATIVE
Glucose, UA: NEGATIVE
Ketones, UA: NEGATIVE
Nitrite, UA: NEGATIVE
Protein, UA: NEGATIVE
Spec Grav, UA: <=1.005
Urobilinogen, UA: NEGATIVE
pH, UA: 6

## 2013-09-16 MED ORDER — ATORVASTATIN CALCIUM 40 MG PO TABS: 40.0000 mg | ORAL_TABLET | Freq: Every day | ORAL | Status: DC

## 2013-09-16 MED ORDER — AZITHROMYCIN 250 MG PO TABS: ORAL_TABLET | ORAL | Status: DC

## 2013-09-16 NOTE — Progress Notes (Signed)
  Subjective:    Patient ID: Elizabeth Wu, female    DOB: [birth year], 62 y.o.   MRN: [id number]  HPI Patient is here today for CPE without pap.  Patient is UTD with colonoscopy. Patient has  Hx of hyperlipidemia and is here for annual labs.  She takes atorvastatin.  She has C/o URI sx's.   Review of Systems No chest pain, SOB, HA, dizziness, vision change, N/V, diarrhea, constipation, dysuria, urinary urgency or frequency, myalgias, arthralgias or rash.     Objective:   Physical Exam  Vital signs noted  Well developed well nourished female.  HEENT - Head atraumatic Normocephalic                Eyes - PERRLA, Conjuctiva - clear Sclera- Clear EOMI                Ears - EAC's Wnl TM's Wnl Gross Hearing WNL                Nose - Nares patent                 Throat - oropharanx wnl Respiratory - Lungs CTA bilateral Cardiac - RRR S1 and S2 without murmur GI - Abdomen soft Nontender and bowel sounds active x 4 Extremities - No edema. Neuro - Grossly intact.      Assessment & Plan:  Hyperlipidemia - Plan: atorvastatin (LIPITOR) 40 MG tablet, Lipid panel, CMP14+EGFR  Routine general medical examination at a health care facility - Plan: atorvastatin (LIPITOR) 40 MG tablet, POCT urinalysis dipstick, POCT UA - Microscopic Only, POCT CBC, Lipid panel, CMP14+EGFR, Thyroid Panel With TSH  URI (upper respiratory infection) - Plan: azithromycin (ZITHROMAX) 250 MG tablet  Deatra Canter FNP

## 2013-09-16 NOTE — Patient Instructions (Signed)
Place hyperlipidemia patient instructions here.  

## 2013-09-17 ENCOUNTER — Other Ambulatory Visit: Payer: Self-pay | Admitting: Family Medicine

## 2013-09-17 LAB — THYROID PANEL WITH TSH
Free Thyroxine Index: 2.3 (ref 1.2–4.9)
T3 Uptake Ratio: 27 % (ref 24–39)
T4, Total: 8.4 ug/dL (ref 4.5–12.0)
TSH: 1.62 u[IU]/mL (ref 0.450–4.500)

## 2013-09-17 LAB — CMP14+EGFR
ALT: 10 IU/L (ref 0–32)
AST: 13 IU/L (ref 0–40)
Albumin/Globulin Ratio: 1.8 (ref 1.1–2.5)
Albumin: 4.4 g/dL (ref 3.6–4.8)
Alkaline Phosphatase: 145 IU/L — ABNORMAL HIGH (ref 39–117)
BUN/Creatinine Ratio: 10 — ABNORMAL LOW (ref 11–26)
BUN: 8 mg/dL (ref 8–27)
CO2: 24 mmol/L (ref 18–29)
Calcium: 9.9 mg/dL (ref 8.6–10.2)
Chloride: 99 mmol/L (ref 97–108)
Creatinine, Ser: 0.83 mg/dL (ref 0.57–1.00)
GFR calc Af Amer: 87 mL/min/{1.73_m2} (ref >59)
GFR calc non Af Amer: 76 mL/min/{1.73_m2} (ref >59)
Globulin, Total: 2.5 g/dL (ref 1.5–4.5)
Glucose: 93 mg/dL (ref 65–99)
Potassium: 4.8 mmol/L (ref 3.5–5.2)
Sodium: 142 mmol/L (ref 134–144)
Total Bilirubin: 1.2 mg/dL (ref 0.0–1.2)
Total Protein: 6.9 g/dL (ref 6.0–8.5)

## 2013-09-17 LAB — LIPID PANEL
Chol/HDL Ratio: 5 ratio units — ABNORMAL HIGH (ref 0.0–4.4)
Cholesterol, Total: 216 mg/dL — ABNORMAL HIGH (ref 100–199)
HDL: 43 mg/dL (ref >39)
LDL Calculated: 132 mg/dL — ABNORMAL HIGH (ref 0–99)
Triglycerides: 204 mg/dL — ABNORMAL HIGH (ref 0–149)
VLDL Cholesterol Cal: 41 mg/dL — ABNORMAL HIGH (ref 5–40)

## 2013-09-17 MED ORDER — ATORVASTATIN CALCIUM 80 MG PO TABS: 80.0000 mg | ORAL_TABLET | Freq: Every day | ORAL | Status: DC

## 2013-09-18 ENCOUNTER — Telehealth: Payer: Self-pay | Admitting: Family Medicine

## 2013-09-18 NOTE — Telephone Encounter (Signed)
Pt notified of lab results Verbalizes understanding 

## 2013-09-18 NOTE — Telephone Encounter (Signed)
Message copied by Bearl Mulberry on Wed Sep 18, 2013  4:42 PM ------      Message from: Deatra Canter      Created: Tue Sep 17, 2013  5:41 PM       Lipids elevated and increase lipitor to 80mg  po qd and rx sent in. ------

## 2014-01-08 ENCOUNTER — Ambulatory Visit (INDEPENDENT_AMBULATORY_CARE_PROVIDER_SITE_OTHER): Payer: BC Managed Care – PPO | Admitting: Family Medicine

## 2014-01-08 ENCOUNTER — Encounter: Payer: Self-pay | Admitting: Family Medicine

## 2014-01-08 VITALS — BP 146/82 | HR 81 | Temp 97.2°F | Wt 174.0 lb

## 2014-01-08 DIAGNOSIS — M545 Low back pain, unspecified: Secondary | ICD-10-CM

## 2014-01-08 DIAGNOSIS — E785 Hyperlipidemia, unspecified: Secondary | ICD-10-CM

## 2014-01-08 DIAGNOSIS — Z0000 Encounter for general adult medical examination without abnormal findings: Secondary | ICD-10-CM

## 2014-01-08 LAB — POCT URINALYSIS DIPSTICK
Bilirubin, UA: NEGATIVE
Blood, UA: NEGATIVE
Glucose, UA: NEGATIVE
Ketones, UA: NEGATIVE
Leukocytes, UA: NEGATIVE
Nitrite, UA: NEGATIVE
Protein, UA: NEGATIVE
Spec Grav, UA: <=1.005
Urobilinogen, UA: NEGATIVE
pH, UA: 5

## 2014-01-08 LAB — POCT UA - MICROSCOPIC ONLY
Casts, Ur, LPF, POC: NEGATIVE
Crystals, Ur, HPF, POC: NEGATIVE
Mucus, UA: NEGATIVE
Yeast, UA: NEGATIVE

## 2014-01-08 MED ORDER — ATORVASTATIN CALCIUM 40 MG PO TABS: 40.0000 mg | ORAL_TABLET | Freq: Every day | ORAL | Status: DC

## 2014-01-08 MED ORDER — CYCLOBENZAPRINE HCL 10 MG PO TABS: 10.0000 mg | ORAL_TABLET | Freq: Three times a day (TID) | ORAL | Status: DC | PRN

## 2014-01-08 MED ORDER — NAPROXEN 500 MG PO TABS: 500.0000 mg | ORAL_TABLET | Freq: Two times a day (BID) | ORAL | Status: DC

## 2014-01-08 NOTE — Progress Notes (Signed)
   Subjective:    Patient ID: Elizabeth Wu, female    DOB: March 06, 1951, 63 y.o.   MRN: [id number]  HPI This 63 y.o. female presents for evaluation of back pain.  She cannot tolerate the lipitor 80mg  po qd.   Review of Systems No chest pain, SOB, HA, dizziness, vision change, N/V, diarrhea, constipation, dysuria, urinary urgency or frequency, myalgias, arthralgias or rash.     Objective:   Physical Exam  Vital signs noted  Well developed well nourished female.  HEENT - Head atraumatic Normocephalic                Eyes - PERRLA, Conjuctiva - clear Sclera- Clear EOMI                Ears - EAC's Wnl TM's Wnl Gross Hearing WNL                Nose - Nares patent                 Throat - oropharanx wnl Respiratory - Lungs CTA bilateral Cardiac - RRR S1 and S2 without murmur GI - Abdomen soft Nontender and bowel sounds active x 4 Extremities - No edema. Neuro - Grossly intact.      Assessment & Plan:  Low back pain - Plan: POCT UA - Microscopic Only, POCT urinalysis dipstick, atorvastatin (LIPITOR) 40 MG tablet, naproxen (NAPROSYN) 500 MG tablet, cyclobenzaprine (FLEXERIL) 10 MG tablet  Hyperlipidemia - Plan: atorvastatin (LIPITOR) 40 MG tablet  Lysbeth Penner FNP

## 2014-04-03 ENCOUNTER — Encounter: Payer: Self-pay | Admitting: Physician Assistant

## 2014-04-03 ENCOUNTER — Ambulatory Visit (INDEPENDENT_AMBULATORY_CARE_PROVIDER_SITE_OTHER): Payer: BC Managed Care – PPO | Admitting: Physician Assistant

## 2014-04-03 VITALS — BP 154/79 | HR 74 | Temp 99.2°F | Wt 173.2 lb

## 2014-04-03 DIAGNOSIS — R197 Diarrhea, unspecified: Secondary | ICD-10-CM

## 2014-04-03 DIAGNOSIS — E785 Hyperlipidemia, unspecified: Secondary | ICD-10-CM

## 2014-04-03 NOTE — Progress Notes (Signed)
Subjective:     Patient ID: Elizabeth Wu, female   DOB: 1951-01-02, 63 y.o.   MRN: [id number]  HPI Pt with loose stools over the last several days Pt also with sl HA and fatigue She is not sure if sx are due to working 18 days straight No OTC meds tried  Review of Systems Denies any abd pain Pt having 3 loose stools per day No bloody or dark tarry stools Denies fever/chills No N/V    Objective:   Physical Exam NAD Oral- membranes moist No cerv nodes Heart- RRR w/o M Lungs- CTA Abd- obese, soft, NT/ND, no masses/HSM    Assessment:     Diarrhea Hyperlipid    Plan:     Push fluids- caff/dairy free Bland diet OTC Immodium prn Since pt is fasting went ahead with full lab for her appt next week Work note written F/U prn

## 2014-04-03 NOTE — Patient Instructions (Signed)
Diarrhea Diarrhea is frequent loose and watery bowel movements. It can cause you to feel weak and dehydrated. Dehydration can cause you to become tired and thirsty, have a dry mouth, and have decreased urination that often is dark yellow. Diarrhea is a sign of another problem, most often an infection that will not last long. In most cases, diarrhea typically lasts 2 3 days. However, it can last longer if it is a sign of something more serious. It is important to treat your diarrhea as directed by your caregive to lessen or prevent future episodes of diarrhea. CAUSES  Some common causes include:  Gastrointestinal infections caused by viruses, bacteria, or parasites.  Food poisoning or food allergies.  Certain medicines, such as antibiotics, chemotherapy, and laxatives.  Artificial sweeteners and fructose.  Digestive disorders. HOME CARE INSTRUCTIONS  Ensure adequate fluid intake (hydration): have 1 cup (8 oz) of fluid for each diarrhea episode. Avoid fluids that contain simple sugars or sports drinks, fruit juices, whole milk products, and sodas. Your urine should be clear or pale yellow if you are drinking enough fluids. Hydrate with an oral rehydration solution that you can purchase at pharmacies, retail stores, and online. You can prepare an oral rehydration solution at home by mixing the following ingredients together:    tsp table salt.   tsp baking soda.   tsp salt substitute containing potassium chloride.  1  tablespoons sugar.  1 L (34 oz) of water.  Certain foods and beverages may increase the speed at which food moves through the gastrointestinal (GI) tract. These foods and beverages should be avoided and include:  Caffeinated and alcoholic beverages.  High-fiber foods, such as raw fruits and vegetables, nuts, seeds, and whole grain breads and cereals.  Foods and beverages sweetened with sugar alcohols, such as xylitol, sorbitol, and mannitol.  Some foods may be well  tolerated and may help thicken stool including:  Starchy foods, such as rice, toast, pasta, low-sugar cereal, oatmeal, grits, baked potatoes, crackers, and bagels.  Bananas.  Applesauce.  Add probiotic-rich foods to help increase healthy bacteria in the GI tract, such as yogurt and fermented milk products.  Wash your hands well after each diarrhea episode.  Only take over-the-counter or prescription medicines as directed by your caregiver.  Take a warm bath to relieve any burning or pain from frequent diarrhea episodes. SEEK IMMEDIATE MEDICAL CARE IF:   You are unable to keep fluids down.  You have persistent vomiting.  You have blood in your stool, or your stools are black and tarry.  You do not urinate in 6 8 hours, or there is only a small amount of very dark urine.  You have abdominal pain that increases or localizes.  You have weakness, dizziness, confusion, or lightheadedness.  You have a severe headache.  Your diarrhea gets worse or does not get better.  You have a fever or persistent symptoms for more than 2 3 days.  You have a fever and your symptoms suddenly get worse. MAKE SURE YOU:   Understand these instructions.  Will watch your condition.  Will get help right away if you are not doing well or get worse. Document Released: 10/28/2002 Document Revised: 10/24/2012 Document Reviewed: 07/15/2012 [employer] Patient Information 2014 [employer].  

## 2014-04-04 LAB — HEPATIC FUNCTION PANEL
ALT: 13 IU/L (ref 0–32)
AST: 16 IU/L (ref 0–40)
Albumin: 4.5 g/dL (ref 3.6–4.8)
Alkaline Phosphatase: 142 IU/L — ABNORMAL HIGH (ref 39–117)
Bilirubin, Direct: 0.19 mg/dL (ref 0.00–0.40)
Total Bilirubin: 0.9 mg/dL (ref 0.0–1.2)
Total Protein: 7.2 g/dL (ref 6.0–8.5)

## 2014-04-04 LAB — BMP8+EGFR
BUN/Creatinine Ratio: 10 — ABNORMAL LOW (ref 11–26)
BUN: 7 mg/dL — ABNORMAL LOW (ref 8–27)
CO2: 24 mmol/L (ref 18–29)
Calcium: 9.4 mg/dL (ref 8.7–10.3)
Chloride: 101 mmol/L (ref 97–108)
Creatinine, Ser: 0.71 mg/dL (ref 0.57–1.00)
GFR calc Af Amer: 106 mL/min/{1.73_m2} (ref >59)
GFR calc non Af Amer: 92 mL/min/{1.73_m2} (ref >59)
Glucose: 93 mg/dL (ref 65–99)
Potassium: 3.9 mmol/L (ref 3.5–5.2)
Sodium: 141 mmol/L (ref 134–144)

## 2014-04-04 LAB — LIPID PANEL
Chol/HDL Ratio: 6 ratio units — ABNORMAL HIGH (ref 0.0–4.4)
Cholesterol, Total: 241 mg/dL — ABNORMAL HIGH (ref 100–199)
HDL: 40 mg/dL (ref >39)
LDL Calculated: 141 mg/dL — ABNORMAL HIGH (ref 0–99)
Triglycerides: 302 mg/dL — ABNORMAL HIGH (ref 0–149)
VLDL Cholesterol Cal: 60 mg/dL — ABNORMAL HIGH (ref 5–40)

## 2014-04-07 ENCOUNTER — Telehealth: Payer: Self-pay | Admitting: Family Medicine

## 2014-04-07 ENCOUNTER — Ambulatory Visit: Payer: BC Managed Care – PPO | Admitting: Family Medicine

## 2014-04-07 NOTE — Telephone Encounter (Signed)
She will call back Tues. 5/26 to schedule appt.  She cancelled appt 04/07/14.  rs

## 2014-04-07 NOTE — Telephone Encounter (Signed)
Message copied by Waverly Ferrari on Mon Apr 07, 2014  3:46 PM ------      Message from: Lodema Pilot      Created: Mon Apr 07, 2014  2:54 PM       Lipid level have gone up since last visit      Rest of labs are fairly stable      Needs to discuss with provider at her visit this week ------

## 2014-04-16 ENCOUNTER — Telehealth: Payer: Self-pay | Admitting: Family Medicine

## 2014-04-16 NOTE — Telephone Encounter (Signed)
appt given for June 2 with bill

## 2014-04-22 ENCOUNTER — Encounter: Payer: Self-pay | Admitting: Family Medicine

## 2014-04-22 ENCOUNTER — Ambulatory Visit (INDEPENDENT_AMBULATORY_CARE_PROVIDER_SITE_OTHER): Payer: BC Managed Care – PPO | Admitting: Family Medicine

## 2014-04-22 VITALS — BP 128/79 | HR 66 | Temp 97.2°F | Ht 63.0 in | Wt 174.6 lb

## 2014-04-22 DIAGNOSIS — M1480 Arthropathies in other specified diseases classified elsewhere, unspecified site: Secondary | ICD-10-CM

## 2014-04-22 DIAGNOSIS — E7029 Other disorders of tyrosine metabolism: Secondary | ICD-10-CM

## 2014-04-22 DIAGNOSIS — E7089 Other disorders of aromatic amino-acid metabolism: Secondary | ICD-10-CM

## 2014-04-22 DIAGNOSIS — E708 Other disorders of aromatic amino-acid metabolism: Secondary | ICD-10-CM

## 2014-04-22 DIAGNOSIS — G5710 Meralgia paresthetica, unspecified lower limb: Secondary | ICD-10-CM

## 2014-04-22 NOTE — Progress Notes (Signed)
   Subjective:    Patient ID: Elizabeth Wu, female    DOB: [birth year], 63 y.o.   MRN: [id number]  HPI This 63 y.o. female presents for evaluation of follow up on labs.  She has elevated LDL and she  Has elevated Trigs.  She was on lipitor 80mg  po qd and couldn't tolerate this due to arthralgias and can tolerate the lipitor 40mg  ok.  She has right lateral thigh pain that burns.   Review of Systems No chest pain, SOB, HA, dizziness, vision change, N/V, diarrhea, constipation, dysuria, urinary urgency or frequency, myalgias, arthralgias or rash.     Objective:   Physical Exam  Vital signs noted  Well developed well nourished female.  HEENT - Head atraumatic Normocephalic                Eyes - PERRLA, Conjuctiva - clear Sclera- Clear EOMI                Ears - EAC's Wnl TM's Wnl Gross Hearing WNL                Nose - Nares patent                 Throat - oropharanx wnl Respiratory - Lungs CTA bilateral Cardiac - RRR S1 and S2 without murmur GI - Abdomen soft Nontender and bowel sounds active x 4 Extremities - No edema. Neuro - Grossly intact.      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

## 2014-07-30 ENCOUNTER — Encounter: Payer: Self-pay | Admitting: Family Medicine

## 2014-07-30 ENCOUNTER — Ambulatory Visit (INDEPENDENT_AMBULATORY_CARE_PROVIDER_SITE_OTHER): Payer: BC Managed Care – PPO | Admitting: Family Medicine

## 2014-07-30 VITALS — BP 139/60 | HR 65 | Temp 97.5°F | Ht 63.0 in | Wt 170.8 lb

## 2014-07-30 DIAGNOSIS — E785 Hyperlipidemia, unspecified: Secondary | ICD-10-CM

## 2014-07-30 DIAGNOSIS — N951 Menopausal and female climacteric states: Secondary | ICD-10-CM

## 2014-07-30 DIAGNOSIS — Z780 Asymptomatic menopausal state: Secondary | ICD-10-CM

## 2014-07-30 LAB — POCT CBC
Granulocyte percent: 53.8 %G (ref 37–80)
HCT, POC: 41.2 % (ref 37.7–47.9)
Hemoglobin: 13.2 g/dL (ref 12.2–16.2)
Lymph, poc: 3.4 (ref 0.6–3.4)
MCH, POC: 29.9 pg (ref 27–31.2)
MCHC: 32.1 g/dL (ref 31.8–35.4)
MCV: 93 fL (ref 80–97)
MPV: 7.5 fL (ref 0–99.8)
POC Granulocyte: 4.1 (ref 2–6.9)
POC LYMPH PERCENT: 44.2 %L (ref 10–50)
Platelet Count, POC: 451 10*3/uL — AB (ref 142–424)
RBC: 4.4 M/uL (ref 4.04–5.48)
RDW, POC: 14.6 %
WBC: 7.7 10*3/uL (ref 4.6–10.2)

## 2014-07-30 NOTE — Progress Notes (Signed)
   Subjective:    Patient ID: Elizabeth Wu, female    DOB: 09-08-1951, 63 y.o.   MRN: [id number]  HPI Patient is here for follow up.  Patient has hx of hyperlipidemia.  Review of Systems No chest pain, SOB, HA, dizziness, vision change, N/V, diarrhea, constipation, dysuria, urinary urgency or frequency, myalgias, arthralgias or rash.     Objective:   Physical Exam Vital signs noted  Well developed well nourished female.  HEENT - Head atraumatic Normocephalic                Eyes - PERRLA, Conjuctiva - clear Sclera- Clear EOMI                Ears - EAC's Wnl TM's Wnl Gross Hearing WNL                Nose - Nares patent                 Throat - oropharanx wnl Respiratory - Lungs CTA bilateral Cardiac - RRR S1 and S2 without murmur GI - Abdomen soft Nontender and bowel sounds active x 4 Extremities - No edema. Neuro - Grossly intact.       Assessment & Plan:   Other and unspecified hyperlipidemia - Plan: POCT CBC, CMP14+EGFR, Lipid panel Continue fish oil tablets Follow up in Dec for [place] FNP

## 2014-07-31 LAB — LIPID PANEL
Chol/HDL Ratio: 6.9 ratio units — ABNORMAL HIGH (ref 0.0–4.4)
Cholesterol, Total: 309 mg/dL — ABNORMAL HIGH (ref 100–199)
HDL: 45 mg/dL (ref >39)
LDL Calculated: 217 mg/dL — ABNORMAL HIGH (ref 0–99)
Triglycerides: 234 mg/dL — ABNORMAL HIGH (ref 0–149)
VLDL Cholesterol Cal: 47 mg/dL — ABNORMAL HIGH (ref 5–40)

## 2014-07-31 LAB — CMP14+EGFR
ALT: 15 IU/L (ref 0–32)
AST: 14 IU/L (ref 0–40)
Albumin/Globulin Ratio: 1.6 (ref 1.1–2.5)
Albumin: 4.5 g/dL (ref 3.6–4.8)
Alkaline Phosphatase: 125 IU/L — ABNORMAL HIGH (ref 39–117)
BUN/Creatinine Ratio: 15 (ref 11–26)
BUN: 13 mg/dL (ref 8–27)
CO2: 23 mmol/L (ref 18–29)
Calcium: 10 mg/dL (ref 8.7–10.3)
Chloride: 100 mmol/L (ref 97–108)
Creatinine, Ser: 0.89 mg/dL (ref 0.57–1.00)
GFR calc Af Amer: 80 mL/min/{1.73_m2} (ref >59)
GFR calc non Af Amer: 70 mL/min/{1.73_m2} (ref >59)
Globulin, Total: 2.8 g/dL (ref 1.5–4.5)
Glucose: 98 mg/dL (ref 65–99)
Potassium: 5.4 mmol/L — ABNORMAL HIGH (ref 3.5–5.2)
Sodium: 141 mmol/L (ref 134–144)
Total Bilirubin: 0.8 mg/dL (ref 0.0–1.2)
Total Protein: 7.3 g/dL (ref 6.0–8.5)

## 2014-08-04 ENCOUNTER — Other Ambulatory Visit: Payer: Self-pay | Admitting: Family Medicine

## 2014-08-04 MED ORDER — ATORVASTATIN CALCIUM 80 MG PO TABS: 80.0000 mg | ORAL_TABLET | Freq: Every day | ORAL | Status: DC

## 2014-10-31 ENCOUNTER — Ambulatory Visit (INDEPENDENT_AMBULATORY_CARE_PROVIDER_SITE_OTHER): Payer: BC Managed Care – PPO | Admitting: Family Medicine

## 2014-10-31 ENCOUNTER — Telehealth: Payer: Self-pay | Admitting: Family Medicine

## 2014-10-31 ENCOUNTER — Other Ambulatory Visit: Payer: Self-pay | Admitting: Family Medicine

## 2014-10-31 ENCOUNTER — Encounter: Payer: Self-pay | Admitting: Family Medicine

## 2014-10-31 VITALS — BP 135/84 | HR 80 | Temp 97.6°F | Ht 63.0 in | Wt 170.4 lb

## 2014-10-31 DIAGNOSIS — R5383 Other fatigue: Secondary | ICD-10-CM

## 2014-10-31 DIAGNOSIS — E785 Hyperlipidemia, unspecified: Secondary | ICD-10-CM

## 2014-10-31 DIAGNOSIS — Z0000 Encounter for general adult medical examination without abnormal findings: Secondary | ICD-10-CM

## 2014-10-31 MED ORDER — SIMVASTATIN 10 MG PO TABS: 10.0000 mg | ORAL_TABLET | Freq: Every day | ORAL | Status: DC

## 2014-10-31 MED ORDER — ROSUVASTATIN CALCIUM 5 MG PO TABS: 5.0000 mg | ORAL_TABLET | Freq: Every day | ORAL | Status: DC

## 2014-10-31 NOTE — Progress Notes (Signed)
   Subjective:    Patient ID: Elizabeth Wu, female    DOB: 09/21/1951, 63 y.o.   MRN: [id number]  HPI Patient is here for CPE w/o pap.   Review of Systems  Constitutional: Negative for fever.  HENT: Negative for ear pain.   Eyes: Negative for discharge.  Respiratory: Negative for cough.   Cardiovascular: Negative for chest pain.  Gastrointestinal: Negative for abdominal distention.  Endocrine: Negative for polyuria.  Genitourinary: Negative for difficulty urinating.  Musculoskeletal: Negative for gait problem and neck pain.  Skin: Negative for color change and rash.  Neurological: Negative for speech difficulty and headaches.  Psychiatric/Behavioral: Negative for agitation.       Objective:    BP 135/84 mmHg  Pulse 80  Temp(Src) 97.6 F (36.4 C) (Oral)  Ht $R'5\' 3"'oT$  (1.6 m)  Wt 170 lb 6.4 oz (77.293 kg)  BMI 30.19 kg/m2 Physical Exam  Constitutional: She is oriented to person, place, and time. She appears well-developed and well-nourished.  HENT:  Head: Normocephalic and atraumatic.  Mouth/Throat: Oropharynx is clear and moist.  Eyes: Pupils are equal, round, and reactive to light.  Neck: Normal range of motion. Neck supple.  Cardiovascular: Normal rate and regular rhythm.   No murmur heard. Pulmonary/Chest: Effort normal and breath sounds normal.  Abdominal: Soft. Bowel sounds are normal. There is no tenderness.  Neurological: She is alert and oriented to person, place, and time.  Skin: Skin is warm and dry.  Psychiatric: She has a normal mood and affect.          Assessment & Plan:     ICD-9-CM ICD-10-CM   1. Hyperlipemia 272.4 E78.5 CMP14+EGFR     Lipid panel  2. Other fatigue 780.79 R53.83 POCT CBC     CMP14+EGFR     Thyroid Panel With TSH     Return in about 1 year (around 11/01/2015).  Lysbeth Penner FNP

## 2014-10-31 NOTE — Addendum Note (Signed)
Addended by: Lysbeth Penner on: 10/31/2014 09:42 AM   Modules accepted: Orders, Medications

## 2014-11-03 NOTE — Telephone Encounter (Signed)
Aware, simvastatin sent in to pharmacy.

## 2014-12-01 ENCOUNTER — Other Ambulatory Visit: Payer: BC Managed Care – PPO

## 2014-12-02 ENCOUNTER — Other Ambulatory Visit (INDEPENDENT_AMBULATORY_CARE_PROVIDER_SITE_OTHER): Payer: 59

## 2014-12-02 DIAGNOSIS — E785 Hyperlipidemia, unspecified: Secondary | ICD-10-CM

## 2014-12-02 DIAGNOSIS — R5383 Other fatigue: Secondary | ICD-10-CM

## 2014-12-02 LAB — POCT CBC
Granulocyte percent: 57.2 %G (ref 37–80)
HCT, POC: 39.5 % (ref 37.7–47.9)
Hemoglobin: 12.9 g/dL (ref 12.2–16.2)
Lymph, poc: 3.5 — AB (ref 0.6–3.4)
MCH, POC: 30.9 pg (ref 27–31.2)
MCHC: 32.7 g/dL (ref 31.8–35.4)
MCV: 94.6 fL (ref 80–97)
MPV: 7.6 fL (ref 0–99.8)
POC Granulocyte: 5.1 (ref 2–6.9)
POC LYMPH PERCENT: 38.6 %L (ref 10–50)
Platelet Count, POC: 353 10*3/uL (ref 142–424)
RBC: 4.2 M/uL (ref 4.04–5.48)
RDW, POC: 13.7 %
WBC: 9 10*3/uL (ref 4.6–10.2)

## 2014-12-02 NOTE — Progress Notes (Signed)
Lab only from Oct 31 2014

## 2014-12-03 ENCOUNTER — Telehealth: Payer: Self-pay | Admitting: Family Medicine

## 2014-12-03 LAB — LIPID PANEL
Chol/HDL Ratio: 4.7 ratio units — ABNORMAL HIGH (ref 0.0–4.4)
Cholesterol, Total: 219 mg/dL — ABNORMAL HIGH (ref 100–199)
HDL: 47 mg/dL (ref >39)
LDL Calculated: 142 mg/dL — ABNORMAL HIGH (ref 0–99)
Triglycerides: 149 mg/dL (ref 0–149)
VLDL Cholesterol Cal: 30 mg/dL (ref 5–40)

## 2014-12-03 LAB — CMP14+EGFR
ALT: 11 IU/L (ref 0–32)
AST: 14 IU/L (ref 0–40)
Albumin/Globulin Ratio: 1.9 (ref 1.1–2.5)
Albumin: 4.3 g/dL (ref 3.6–4.8)
Alkaline Phosphatase: 112 IU/L (ref 39–117)
BUN/Creatinine Ratio: 15 (ref 11–26)
BUN: 12 mg/dL (ref 8–27)
CO2: 26 mmol/L (ref 18–29)
Calcium: 9.2 mg/dL (ref 8.7–10.3)
Chloride: 103 mmol/L (ref 97–108)
Creatinine, Ser: 0.8 mg/dL (ref 0.57–1.00)
GFR calc Af Amer: 91 mL/min/{1.73_m2} (ref >59)
GFR calc non Af Amer: 79 mL/min/{1.73_m2} (ref >59)
Globulin, Total: 2.3 g/dL (ref 1.5–4.5)
Glucose: 94 mg/dL (ref 65–99)
Potassium: 5 mmol/L (ref 3.5–5.2)
Sodium: 142 mmol/L (ref 134–144)
Total Bilirubin: 0.8 mg/dL (ref 0.0–1.2)
Total Protein: 6.6 g/dL (ref 6.0–8.5)

## 2014-12-03 LAB — THYROID PANEL WITH TSH
Free Thyroxine Index: 2.1 (ref 1.2–4.9)
T3 Uptake Ratio: 27 % (ref 24–39)
T4, Total: 7.7 ug/dL (ref 4.5–12.0)
TSH: 1.93 u[IU]/mL (ref 0.450–4.500)

## 2014-12-03 NOTE — Telephone Encounter (Signed)
-----   Message from Lysbeth Penner, FNP sent at 12/03/2014  8:18 AM EST ----- Lipids improved and take fish oil otc

## 2014-12-03 NOTE — Telephone Encounter (Signed)
Patient aware.

## 2014-12-09 ENCOUNTER — Encounter: Payer: Self-pay | Admitting: *Deleted

## 2015-04-08 ENCOUNTER — Ambulatory Visit: Payer: 59 | Admitting: Family Medicine

## 2015-04-10 ENCOUNTER — Encounter: Payer: Self-pay | Admitting: Family Medicine

## 2015-04-10 ENCOUNTER — Ambulatory Visit (INDEPENDENT_AMBULATORY_CARE_PROVIDER_SITE_OTHER): Payer: 59 | Admitting: Family Medicine

## 2015-04-10 VITALS — BP 133/74 | HR 67 | Temp 97.7°F | Ht 63.0 in | Wt 176.0 lb

## 2015-04-10 DIAGNOSIS — E785 Hyperlipidemia, unspecified: Secondary | ICD-10-CM | POA: Diagnosis not present

## 2015-04-10 MED ORDER — BENZONATATE 100 MG PO CAPS: 100.0000 mg | ORAL_CAPSULE | Freq: Two times a day (BID) | ORAL | Status: DC | PRN

## 2015-04-10 NOTE — Progress Notes (Signed)
   Subjective:    Patient ID: Elizabeth Wu, female    DOB: 1951-01-02, 64 y.o.   MRN: [id number]  HPI 64 year old female with her only problem hyperlipidemia. She has been on simvastatin 10 mg. She is compliant and denies any side effects especially myalgias or weakness. Her LDL has been as high as 200. It is surprising that the significant reduction has occurred at this low dose of simvastatin.  Patient Active Problem List   Diagnosis Date Noted  . Meralgia paraesthetica 04/22/2014  . 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 Hyperlipidemia 05/31/2013  . Osteopenia 05/31/2013  . DJD (degenerative joint disease) 05/31/2013   Outpatient Encounter Prescriptions as of 04/10/2015  Medication Sig  . cyclobenzaprine (FLEXERIL) 10 MG tablet Take 1 tablet (10 mg total) by mouth 3 (three) times daily as needed for muscle spasms.  . Omega-3 Fatty Acids (FISH OIL) 1000 MG CAPS Take 1 capsule by mouth daily.  . simvastatin (ZOCOR) 10 MG tablet Take 1 tablet (10 mg total) by mouth at bedtime.   No facility-administered encounter medications on file as of 04/10/2015.      Review of Systems  Constitutional: Negative.   HENT: Positive for postnasal drip.   Respiratory: Positive for cough.   Cardiovascular: Negative.   Gastrointestinal: Negative.   Neurological: Negative.   Psychiatric/Behavioral: Negative.        Objective:   Physical Exam  Constitutional: She is oriented to person, place, and time. She appears well-developed and well-nourished.  Cardiovascular: Normal rate and regular rhythm.   Pulmonary/Chest: Effort normal.  Abdominal: Soft. She exhibits no mass. There is no tenderness.  Neurological: She is alert and oriented to person, place, and time.          Assessment & Plan:  1. Hyperlipidemia This is a scheduled 6 month check of lipids and assessment of effectiveness of statin. We will also check LFTs but she will likely need to continue with statin to control her LDL -  CMP14+EGFR - Lipid panel  Wardell Honour MD

## 2015-04-11 LAB — LIPID PANEL
Chol/HDL Ratio: 4.7 ratio units — ABNORMAL HIGH (ref 0.0–4.4)
Cholesterol, Total: 220 mg/dL — ABNORMAL HIGH (ref 100–199)
HDL: 47 mg/dL (ref >39)
LDL Calculated: 134 mg/dL — ABNORMAL HIGH (ref 0–99)
Triglycerides: 193 mg/dL — ABNORMAL HIGH (ref 0–149)
VLDL Cholesterol Cal: 39 mg/dL (ref 5–40)

## 2015-04-11 LAB — CMP14+EGFR
ALT: 10 IU/L (ref 0–32)
AST: 13 IU/L (ref 0–40)
Albumin/Globulin Ratio: 1.7 (ref 1.1–2.5)
Albumin: 4.3 g/dL (ref 3.6–4.8)
Alkaline Phosphatase: 118 IU/L — ABNORMAL HIGH (ref 39–117)
BUN/Creatinine Ratio: 14 (ref 11–26)
BUN: 11 mg/dL (ref 8–27)
Bilirubin Total: 0.7 mg/dL (ref 0.0–1.2)
CO2: 26 mmol/L (ref 18–29)
Calcium: 9.5 mg/dL (ref 8.7–10.3)
Chloride: 101 mmol/L (ref 97–108)
Creatinine, Ser: 0.78 mg/dL (ref 0.57–1.00)
GFR calc Af Amer: 94 mL/min/{1.73_m2} (ref >59)
GFR calc non Af Amer: 81 mL/min/{1.73_m2} (ref >59)
Globulin, Total: 2.6 g/dL (ref 1.5–4.5)
Glucose: 96 mg/dL (ref 65–99)
Potassium: 4.9 mmol/L (ref 3.5–5.2)
Sodium: 142 mmol/L (ref 134–144)
Total Protein: 6.9 g/dL (ref 6.0–8.5)

## 2015-04-13 NOTE — Progress Notes (Signed)
lmtcb

## 2015-04-27 ENCOUNTER — Ambulatory Visit (INDEPENDENT_AMBULATORY_CARE_PROVIDER_SITE_OTHER): Payer: 59 | Admitting: Family

## 2015-04-27 ENCOUNTER — Encounter: Payer: Self-pay | Admitting: Family

## 2015-04-27 VITALS — BP 152/77 | HR 77 | Temp 97.3°F | Ht 63.0 in | Wt 171.4 lb

## 2015-04-27 DIAGNOSIS — A084 Viral intestinal infection, unspecified: Secondary | ICD-10-CM | POA: Diagnosis not present

## 2015-04-27 MED ORDER — ONDANSETRON HCL 4 MG PO TABS: 4.0000 mg | ORAL_TABLET | Freq: Three times a day (TID) | ORAL | Status: DC | PRN

## 2015-04-27 NOTE — Patient Instructions (Signed)

## 2015-04-27 NOTE — Progress Notes (Signed)
   Subjective:    Patient ID: Elizabeth Wu, female    DOB: [birth year], 64 y.o.   MRN: [id number]  Diarrhea  This is a new problem. The current episode started in the past 7 days. The problem occurs 2 to 4 times per day. The problem has been unchanged. Associated symptoms include chills, headaches and myalgias. Pertinent negatives include no increased  flatus or vomiting. Nothing aggravates the symptoms. Risk factors include recent hospitalization. She has tried increased fluids for the symptoms. The treatment provided no relief.  Headache  Pertinent negatives include no vomiting.      Review of Systems  Constitutional: Positive for chills.  Eyes: Negative.   Respiratory: Negative.  Negative for shortness of breath.   Cardiovascular: Negative.  Negative for palpitations.  Gastrointestinal: Positive for diarrhea. Negative for vomiting and flatus.  Endocrine: Negative.   Genitourinary: Negative.   Musculoskeletal: Positive for myalgias.  Neurological: Positive for headaches.  Hematological: Negative.   Psychiatric/Behavioral: Negative.   All other systems reviewed and are negative.      Objective:   Physical Exam  Constitutional: She is oriented to person, place, and time. She appears well-developed and well-nourished. No distress.  HENT:  Head: Normocephalic and atraumatic.  Nasal passage erythemas with mild swelling    Eyes: Pupils are equal, round, and reactive to light.  Neck: Normal range of motion. Neck supple. No thyromegaly present.  Cardiovascular: Normal rate, regular rhythm, normal heart sounds and intact distal pulses.   No murmur heard. Pulmonary/Chest: Effort normal and breath sounds normal. No respiratory distress. She has no wheezes.  Abdominal: Soft. Bowel sounds are normal. She exhibits no distension. There is no tenderness.  Musculoskeletal: Normal range of motion. She exhibits no edema or tenderness.  Neurological: She is alert and oriented to person,  place, and time. She has normal reflexes. No cranial nerve deficit.  Skin: Skin is warm and dry.  Psychiatric: She has a normal mood and affect. Her behavior is normal. Judgment and thought content normal.  Vitals reviewed.     BP 152/77 mmHg  Pulse 77  Temp(Src) 97.3 F (36.3 C) (Oral)  Ht '5\' 3"'$  (1.6 m)  Wt 171 lb 6.4 oz (77.747 kg)  BMI 30.37 kg/m2     Assessment & Plan:  1. Viral gastroenteritis -Force fluids -Tylenol prn for pain and fever -Imodium as needed -Bland diet -RTO prn - ondansetron (ZOFRAN) 4 MG tablet; Take 1 tablet (4 mg total) by mouth every 8 (eight) hours as needed for nausea or vomiting.  Dispense: 20 tablet; Refill: 0  Elizabeth Dun, FNP

## 2015-11-04 ENCOUNTER — Ambulatory Visit (INDEPENDENT_AMBULATORY_CARE_PROVIDER_SITE_OTHER): Payer: 59 | Admitting: Family Medicine

## 2015-11-04 ENCOUNTER — Encounter: Payer: Self-pay | Admitting: Family Medicine

## 2015-11-04 VITALS — BP 148/85 | HR 76 | Temp 97.0°F | Ht 63.0 in | Wt 179.5 lb

## 2015-11-04 DIAGNOSIS — Z0000 Encounter for general adult medical examination without abnormal findings: Secondary | ICD-10-CM | POA: Diagnosis not present

## 2015-11-04 DIAGNOSIS — E785 Hyperlipidemia, unspecified: Secondary | ICD-10-CM

## 2015-11-04 DIAGNOSIS — M8580 Other specified disorders of bone density and structure, unspecified site: Secondary | ICD-10-CM

## 2015-11-04 MED ORDER — SIMVASTATIN 10 MG PO TABS: 10.0000 mg | ORAL_TABLET | Freq: Every day | ORAL | Status: DC

## 2015-11-04 NOTE — Progress Notes (Signed)
   Subjective:    Patient ID: Elizabeth Wu, female    DOB: 11/30/1950, 64 y.o.   MRN: [id number]  HPI 64 year old female here for a physical. She is basically healthy. She takes statin for lipids. She is only on 10 mg of simvastatin. There is no history of diabetes or hypertension. She is up-to-date on mammograms. Her last colonoscopy was about 10 years ago by history. Pap smears are up-to-date. There is no family history of cancer either in breast: Or cervical.    Review of Systems  Constitutional: Negative.   HENT: Negative.   Eyes: Negative.   Respiratory: Negative.   Cardiovascular: Negative.   Gastrointestinal: Negative.   Endocrine: Negative.   Genitourinary: Negative.   Hematological: Negative.   Psychiatric/Behavioral: Negative.       BP 148/85 mmHg  Pulse 76  Temp(Src) 97 F (36.1 C) (Oral)  Ht _0  (1.6 m)  Wt 179 lb 8 oz (81.421 kg)  BMI 31.81 kg/m2  Objective:   Physical Exam  Constitutional: She is oriented to person, place, and time. She appears well-developed and well-nourished.  HENT:  Head: Normocephalic.  Neck: Normal range of motion.  Cardiovascular: Normal rate and regular rhythm.   Pulmonary/Chest: Effort normal and breath sounds normal.  Abdominal: Soft. Bowel sounds are normal. There is no tenderness.  Musculoskeletal: Normal range of motion.  Neurological: She is alert and oriented to person, place, and time.  Psychiatric: She has a normal mood and affect.          Assessment & Plan:  1. Hyperlipidemia We'll repeat lipids at patient request not optimally managed but dose of simvastatin as really minimal. Depending on level might suggest increasing to at least 20 mg a day - Lipid panel - CMP14+EGFR - simvastatin (ZOCOR) 10 MG tablet; Take 1 tablet (10 mg total) by mouth at bedtime.  Dispense: 90 tablet; Refill: 3  2. Osteopenia We'll follow up on bone density needs to be on calcium and vitamin D  3. Annual physical exam Exam is within  normal limits. She is overweight with BMI 30 basically she is pretty healthy for her age  Wardell Honour MD

## 2015-11-05 LAB — LIPID PANEL
Chol/HDL Ratio: 5.1 ratio units — ABNORMAL HIGH (ref 0.0–4.4)
Cholesterol, Total: 239 mg/dL — ABNORMAL HIGH (ref 100–199)
HDL: 47 mg/dL (ref >39)
LDL Calculated: 159 mg/dL — ABNORMAL HIGH (ref 0–99)
Triglycerides: 164 mg/dL — ABNORMAL HIGH (ref 0–149)
VLDL Cholesterol Cal: 33 mg/dL (ref 5–40)

## 2015-11-05 LAB — CMP14+EGFR
ALT: 12 IU/L (ref 0–32)
AST: 13 IU/L (ref 0–40)
Albumin/Globulin Ratio: 1.7 (ref 1.1–2.5)
Albumin: 4.3 g/dL (ref 3.6–4.8)
Alkaline Phosphatase: 104 IU/L (ref 39–117)
BUN/Creatinine Ratio: 12 (ref 11–26)
BUN: 9 mg/dL (ref 8–27)
Bilirubin Total: 1 mg/dL (ref 0.0–1.2)
CO2: 25 mmol/L (ref 18–29)
Calcium: 9.7 mg/dL (ref 8.7–10.3)
Chloride: 99 mmol/L (ref 96–106)
Creatinine, Ser: 0.76 mg/dL (ref 0.57–1.00)
GFR calc Af Amer: 96 mL/min/{1.73_m2} (ref >59)
GFR calc non Af Amer: 83 mL/min/{1.73_m2} (ref >59)
Globulin, Total: 2.6 g/dL (ref 1.5–4.5)
Glucose: 96 mg/dL (ref 65–99)
Potassium: 5 mmol/L (ref 3.5–5.2)
Sodium: 140 mmol/L (ref 134–144)
Total Protein: 6.9 g/dL (ref 6.0–8.5)

## 2015-11-11 ENCOUNTER — Telehealth: Payer: Self-pay | Admitting: Family Medicine

## 2015-11-11 MED ORDER — SIMVASTATIN 20 MG PO TABS: 20.0000 mg | ORAL_TABLET | Freq: Every day | ORAL | Status: DC

## 2015-11-11 NOTE — Telephone Encounter (Signed)
Patient aware of results and medication has been changed,

## 2015-12-03 ENCOUNTER — Ambulatory Visit (INDEPENDENT_AMBULATORY_CARE_PROVIDER_SITE_OTHER): Payer: 59 | Admitting: Pediatrics

## 2015-12-03 ENCOUNTER — Encounter: Payer: Self-pay | Admitting: Pediatrics

## 2015-12-03 VITALS — BP 135/78 | HR 81 | Temp 97.1°F | Ht 63.0 in | Wt 177.2 lb

## 2015-12-03 DIAGNOSIS — K5900 Constipation, unspecified: Secondary | ICD-10-CM | POA: Diagnosis not present

## 2015-12-03 NOTE — Progress Notes (Signed)
    Subjective:    Patient ID: Camri Molloy, female    DOB: 1951/01/07, 65 y.o.   MRN: [id number]  CC: Constipation and Abdominal Pain   HPI: Adalina Dopson is a 65 y.o. female presenting for Constipation and Abdominal Pain  Couple times a week has a nagging dull pain on the left side of abdomen. 5 days ago was constipated, kept feeling like she could go but then couldn't.  Took MoM and miralax. Had solid stool after that twice. No stools since then Feels like she has to go but would have to strain.  Hasnt taken anything for constipation past few days. Doesn't take miralax every day,  Ongoing problem with constipation for years Eating and drinking normally  Depression screen [facility] 2/9 12/03/2015 11/04/2015 10/31/2014 07/30/2014 05/31/2013  Decreased Interest 0 0 0 0 0  Down, Depressed, Hopeless 0 0 0 0 0  PHQ - 2 Score 0 0 0 0 0     Relevant past medical, surgical, family and social history reviewed and updated as indicated. Interim medical history since our last visit reviewed. Allergies and medications reviewed and updated.    ROS: Per HPI unless specifically indicated above  History  Smoking status  . Current Every Day Smoker -- 0.50 packs/day for 40 years  . Types: Cigarettes  Smokeless tobacco  . Never Used    Past Medical History Patient Active Problem List   Diagnosis Date Noted  . Annual physical exam 11/04/2015  . Meralgia paraesthetica 04/22/2014  . 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 Hyperlipidemia 05/31/2013  . Osteopenia 05/31/2013  . DJD (degenerative joint disease) 05/31/2013    Current Outpatient Prescriptions  Medication Sig Dispense Refill  . Omega-3 Fatty Acids (FISH OIL) 1000 MG CAPS Take 1 capsule by mouth daily.    . simvastatin (ZOCOR) 20 MG tablet Take 1 tablet (20 mg total) by mouth at bedtime. 90 tablet 3   No current facility-administered medications for this visit.       Objective:    BP 135/78 mmHg  Pulse 81   Temp(Src) 97.1 F (36.2 C) (Oral)  Ht '5\' 3"'$  (1.6 m)  Wt 177 lb 3.2 oz (80.377 kg)  BMI 31.40 kg/m2  Wt Readings from Last 3 Encounters:  12/03/15 177 lb 3.2 oz (80.377 kg)  11/04/15 179 lb 8 oz (81.421 kg)  04/27/15 171 lb 6.4 oz (77.747 kg)    Gen: NAD, alert, cooperative with exam, NCAT EYES: EOMI, no scleral injection or icterus ENT: OP without erythema LYMPH: no cervical LAD CV: NRRR, normal S1/S2, no murmur, distal pulses 2+ b/l Resp: CTABL, no wheezes, normal WOB Abd: +BS, soft, NTND. no guarding or organomegaly Ext: No edema, warm Neuro: Alert and oriented     Assessment & Plan:    Tawni was seen today for constipation and abdominal pain. Gave sample of linzess. Take tonight. Also discussed constipation cleanout with miralax. Maintenance with miralax and docusate, go back to linzess if not going every day.  Diagnoses and all orders for this visit:  Constipation, unspecified constipation type   Follow up plan: Return in about 4 weeks (around 12/31/2015). sooner if needed  Assunta Found, MD [facility] 12/03/2015, 3:25 PM

## 2015-12-03 NOTE — Patient Instructions (Signed)
Constipation Linzess: take one daily, start tonight If no stool tonight tomorrow morning start miralax every two hours   Daily plan: Docusate/colace stool softener: take one tab in morning and one tab at night  Miralax every morning and afternoon  If not going regularly on this, add in linzess.

## 2015-12-24 ENCOUNTER — Ambulatory Visit: Payer: 59 | Admitting: Pediatrics

## 2016-01-04 ENCOUNTER — Ambulatory Visit (INDEPENDENT_AMBULATORY_CARE_PROVIDER_SITE_OTHER): Payer: 59 | Admitting: Family Medicine

## 2016-01-04 ENCOUNTER — Encounter: Payer: Self-pay | Admitting: Family Medicine

## 2016-01-04 VITALS — BP 127/83 | HR 82 | Temp 97.9°F | Ht 63.0 in | Wt 173.0 lb

## 2016-01-04 DIAGNOSIS — M25562 Pain in left knee: Secondary | ICD-10-CM | POA: Diagnosis not present

## 2016-01-04 DIAGNOSIS — Z720 Tobacco use: Secondary | ICD-10-CM | POA: Diagnosis not present

## 2016-01-04 DIAGNOSIS — J019 Acute sinusitis, unspecified: Secondary | ICD-10-CM | POA: Diagnosis not present

## 2016-01-04 DIAGNOSIS — Z716 Tobacco abuse counseling: Secondary | ICD-10-CM

## 2016-01-04 MED ORDER — ONDANSETRON 4 MG PO TBDP: 4.0000 mg | ORAL_TABLET | Freq: Three times a day (TID) | ORAL | Status: DC | PRN

## 2016-01-04 MED ORDER — FLUTICASONE PROPIONATE 50 MCG/ACT NA SUSP: 1.0000 | Freq: Two times a day (BID) | NASAL | Status: DC | PRN

## 2016-01-04 NOTE — Progress Notes (Signed)
BP 127/83 mmHg  Pulse 82  Temp(Src) 97.9 F (36.6 C) (Oral)  Ht '5\' 3"'$  (1.6 m)  Wt 173 lb (78.472 kg)  BMI 30.65 kg/m2   Subjective:    Patient ID: Elizabeth Wu, female    DOB: 31-Dec-1950, 65 y.o.   MRN: [id number]  HPI: Elizabeth Wu is a 65 y.o. female presenting on 01/04/2016 for Sinusitis; Cough; and Left knee pain   HPI Sinus congestion and cough Patient has been having sinus congestion and cough for the past 5 days. She denies any fevers or chills. Denies any shortness of breath and wheezing. She's been having the shortness of breath and cough that is been starting to go down to her chest over the past day. She has tried some old [employer] and over-the-counter Coricidin spray without much success. She denies any sick contacts that she knows of. She does still smoke about a half a pack per day and is not ready to try anything to help her quit at this point.  Knee pain Patient has been having left knee pain intermittently in his been worsening over the past week to 2 weeks. She's had this previously. Usually comes and goes. It is described as sharp burning sensation more on the lateral side of her left knee. It is often worse with prolonged standing and going up and down stairs. She denies any overlying erythema or warmth. She does sit there is mild swelling or effusion in the knee. She has not taken any anti-inflammatories for just yet.  Relevant past medical, surgical, family and social history reviewed and updated as indicated. Interim medical history since our last visit reviewed. Allergies and medications reviewed and updated.  Review of Systems  Constitutional: Negative for fever and chills.  HENT: Positive for congestion, postnasal drip, rhinorrhea, sinus pressure, sneezing and sore throat. Negative for ear discharge and ear pain.   Eyes: Negative for pain, redness and visual disturbance.  Respiratory: Positive for cough. Negative for chest tightness and shortness of  breath.   Cardiovascular: Negative for chest pain and leg swelling.  Genitourinary: Negative for dysuria and difficulty urinating.  Musculoskeletal: Positive for joint swelling and arthralgias. Negative for back pain and gait problem.  Skin: Negative for rash.  Neurological: Negative for light-headedness and headaches.  Psychiatric/Behavioral: Negative for behavioral problems and agitation.  All other systems reviewed and are negative.   Per HPI unless specifically indicated above     Medication List       This list is accurate as of: 01/04/16  9:22 AM.  Always use your most recent med list.               Fish Oil 1000 MG Caps  Take 1 capsule by mouth daily.     fluticasone 50 MCG/ACT nasal spray  Commonly known as:  FLONASE  Place 1 spray into both nostrils 2 (two) times daily as needed for allergies or rhinitis.     ondansetron 4 MG disintegrating tablet  Commonly known as:  ZOFRAN ODT  Take 1 tablet (4 mg total) by mouth every 8 (eight) hours as needed for nausea or vomiting.     simvastatin 20 MG tablet  Commonly known as:  ZOCOR  Take 1 tablet (20 mg total) by mouth at bedtime.           Objective:    BP 127/83 mmHg  Pulse 82  Temp(Src) 97.9 F (36.6 C) (Oral)  Ht '5\' 3"'$  (1.6 m)  Wt 173 lb (  78.472 kg)  BMI 30.65 kg/m2  Wt Readings from Last 3 Encounters:  01/04/16 173 lb (78.472 kg)  12/03/15 177 lb 3.2 oz (80.377 kg)  11/04/15 179 lb 8 oz (81.421 kg)    Physical Exam  Constitutional: She is oriented to person, place, and time. She appears well-developed and well-nourished. No distress.  HENT:  Right Ear: Tympanic membrane, external ear and ear canal normal.  Left Ear: Tympanic membrane, external ear and ear canal normal.  Nose: Mucosal edema and rhinorrhea present. No epistaxis. Right sinus exhibits no maxillary sinus tenderness and no frontal sinus tenderness. Left sinus exhibits no maxillary sinus tenderness and no frontal sinus tenderness.    Mouth/Throat: Uvula is midline and mucous membranes are normal. Posterior oropharyngeal edema and posterior oropharyngeal erythema present. No oropharyngeal exudate or tonsillar abscesses.  Eyes: Conjunctivae and EOM are normal.  Cardiovascular: Normal rate, regular rhythm, normal heart sounds and intact distal pulses.   No murmur heard. Pulmonary/Chest: Effort normal and breath sounds normal. No respiratory distress. She has no wheezes.  Musculoskeletal: Normal range of motion. She exhibits no edema.       Left knee: She exhibits effusion (Mild). She exhibits normal range of motion, no swelling, no erythema, normal alignment, no LCL laxity, normal patellar mobility, no bony tenderness, normal meniscus and no MCL laxity. Tenderness found. Lateral joint line tenderness noted.  Neurological: She is alert and oriented to person, place, and time. Coordination normal.  Skin: Skin is warm and dry. No rash noted. She is not diaphoretic.  Psychiatric: She has a normal mood and affect. Her behavior is normal.  Nursing note and vitals reviewed.     Assessment & Plan:   Problem List Items Addressed This Visit    None    Visit Diagnoses    Acute rhinosinusitis    -  Primary    Relevant Medications    fluticasone (FLONASE) 50 MCG/ACT nasal spray    ondansetron (ZOFRAN ODT) 4 MG disintegrating tablet    Encounter for smoking cessation counseling        Patient refuses to quit at this point    Left knee pain        Based on exam likely arthritis, will try Aleve for a solid week or 2, if not improved back for x-rays and injection        Follow up plan: Return if symptoms worsen or fail to improve.  Counseling provided for all of the vaccine components No orders of the defined types were placed in this encounter.    Caryl Pina, MD [facility] 01/04/2016, 9:22 AM

## 2016-01-07 ENCOUNTER — Telehealth: Payer: Self-pay | Admitting: Family Medicine

## 2016-01-07 MED ORDER — AMOXICILLIN 500 MG PO CAPS: 500.0000 mg | ORAL_CAPSULE | Freq: Three times a day (TID) | ORAL | Status: DC

## 2016-01-07 NOTE — Telephone Encounter (Signed)
Patient is calling wanting an anitbiotic

## 2016-01-07 NOTE — Telephone Encounter (Signed)
Patient aware.

## 2016-01-07 NOTE — Telephone Encounter (Signed)
Did you mean to send this to me I don't know why I got this looks like it has already been handled

## 2016-01-07 NOTE — Telephone Encounter (Signed)
Rx for amoxicillin 500 mg 3 times a day called to pharmacy for presumed sinus infection

## 2016-01-13 ENCOUNTER — Telehealth: Payer: Self-pay | Admitting: Family Medicine

## 2016-01-19 ENCOUNTER — Encounter: Payer: 59 | Admitting: *Deleted

## 2016-01-19 LAB — HM MAMMOGRAPHY

## 2016-01-22 ENCOUNTER — Telehealth: Payer: Self-pay | Admitting: *Deleted

## 2016-01-22 ENCOUNTER — Encounter: Payer: Self-pay | Admitting: *Deleted

## 2016-01-22 NOTE — Telephone Encounter (Signed)
-----   Message from Wardell Honour, MD sent at 01/22/2016  4:40 PM EST ----- Screening mammogram is normal

## 2016-01-22 NOTE — Telephone Encounter (Signed)
Pt notified of results

## 2016-03-08 ENCOUNTER — Encounter: Payer: Self-pay | Admitting: Family Medicine

## 2016-03-08 ENCOUNTER — Telehealth: Payer: Self-pay | Admitting: Family

## 2016-03-08 ENCOUNTER — Ambulatory Visit (INDEPENDENT_AMBULATORY_CARE_PROVIDER_SITE_OTHER): Payer: 59 | Admitting: Family Medicine

## 2016-03-08 VITALS — BP 131/76 | HR 77 | Temp 96.6°F | Ht 63.0 in | Wt 172.4 lb

## 2016-03-08 DIAGNOSIS — A084 Viral intestinal infection, unspecified: Secondary | ICD-10-CM

## 2016-03-08 NOTE — Progress Notes (Signed)
   HPI  Patient presents today here with nausea and vomiting.  Patient woke up this morning about 12 AM and had crampy epigastric abdominal pain. She then began to have episodes of emesis that contained only food or fluids, no blood or bile. She had approximately 4-5 episodes and then resolved around 5 AM. Since that time she continues to feel poorly, however she has not had any additional episodes of vomiting.  She's tolerating fluids but has been hesitant to eat She denies any shortness of breath, cough, fever, chills, or body aches. She was unable to work today and does not feel that she'll go back tomorrow  PMH: Smoking status noted ROS: Per HPI  Objective: BP 131/76 mmHg  Pulse 77  Temp(Src) 96.6 F (35.9 C) (Oral)  Ht '5\' 3"'$  (1.6 m)  Wt 172 lb 6.4 oz (78.2 kg)  BMI 30.55 kg/m2 Gen: NAD, alert, cooperative with exam HEENT: NCAT, oropharynx clear with moist mucosa CV: RRR, good S1/S2, no murmur Resp: CTABL, no wheezes, non-labored Abd: SNTND, BS present, no guarding or organomegaly Ext: No edema, warm Neuro: Alert and oriented, No gross deficits  Assessment and plan:  # Viral gastroenteritis Appears well-hydrated, has Zofran at home to try, encouraged her to use it 2 days of work Discussed supportive care including small frequent amounts of fluid. Discussed reasons to seek emergency medical care as well as return to the clinic.    Laroy Apple, MD [facility] 03/08/2016, 5:19 PM

## 2016-03-08 NOTE — Patient Instructions (Signed)
Great to meet you!  Try to get plenty of fluids  Try the zofran if the nausea returns  Viral Gastroenteritis Viral gastroenteritis is also known as stomach flu. This condition affects the stomach and intestinal tract. It can cause sudden diarrhea and vomiting. The illness typically lasts 3 to 8 days. Most people develop an immune response that eventually gets rid of the virus. While this natural response develops, the virus can make you quite ill. CAUSES  Many different viruses can cause gastroenteritis, such as rotavirus or noroviruses. You can catch one of these viruses by consuming contaminated food or water. You may also catch a virus by sharing utensils or other personal items with an infected person or by touching a contaminated surface. SYMPTOMS  The most common symptoms are diarrhea and vomiting. These problems can cause a severe loss of body fluids (dehydration) and a body salt (electrolyte) imbalance. Other symptoms may include:  Fever.  Headache.  Fatigue.  Abdominal pain. DIAGNOSIS  Your caregiver can usually diagnose viral gastroenteritis based on your symptoms and a physical exam. A stool sample may also be taken to test for the presence of viruses or other infections. TREATMENT  This illness typically goes away on its own. Treatments are aimed at rehydration. The most serious cases of viral gastroenteritis involve vomiting so severely that you are not able to keep fluids down. In these cases, fluids must be given through an intravenous line (IV). HOME CARE INSTRUCTIONS   Drink enough fluids to keep your urine clear or pale yellow. Drink small amounts of fluids frequently and increase the amounts as tolerated.  Ask your caregiver for specific rehydration instructions.  Avoid:  Foods high in sugar.  Alcohol.  Carbonated drinks.  Tobacco.  Juice.  Caffeine drinks.  Extremely hot or cold fluids.  Fatty, greasy foods.  Too much intake of anything at one  time.  Dairy products until 24 to 48 hours after diarrhea stops.  You may consume probiotics. Probiotics are active cultures of beneficial bacteria. They may lessen the amount and number of diarrheal stools in adults. Probiotics can be found in yogurt with active cultures and in supplements.  Wash your hands well to avoid spreading the virus.  Only take over-the-counter or prescription medicines for pain, discomfort, or fever as directed by your caregiver. Do not give aspirin to children. Antidiarrheal medicines are not recommended.  Ask your caregiver if you should continue to take your regular prescribed and over-the-counter medicines.  Keep all follow-up appointments as directed by your caregiver. SEEK IMMEDIATE MEDICAL CARE IF:   You are unable to keep fluids down.  You do not urinate at least once every 6 to 8 hours.  You develop shortness of breath.  You notice blood in your stool or vomit. This may look like coffee grounds.  You have abdominal pain that increases or is concentrated in one small area (localized).  You have persistent vomiting or diarrhea.  You have a fever.  The patient is a child younger than 3 months, and he or she has a fever.  The patient is a child older than 3 months, and he or she has a fever and persistent symptoms.  The patient is a child older than 3 months, and he or she has a fever and symptoms suddenly get worse.  The patient is a baby, and he or she has no tears when crying. MAKE SURE YOU:   Understand these instructions.  Will watch your condition.  Will get help  right away if you are not doing well or get worse.   This information is not intended to replace advice given to you by your health care provider. Make sure you discuss any questions you have with your health care provider.   Document Released: 11/07/2005 Document Revised: 01/30/2012 Document Reviewed: 08/24/2011 Elsevier Interactive Patient Education [employer].

## 2016-03-08 NOTE — Telephone Encounter (Signed)
Pt given appt today at 5 in the after hours clinic. Pt aware she will be seeing the afterhours provider.

## 2016-04-05 ENCOUNTER — Ambulatory Visit (INDEPENDENT_AMBULATORY_CARE_PROVIDER_SITE_OTHER): Payer: 59 | Admitting: Family Medicine

## 2016-04-05 ENCOUNTER — Encounter: Payer: Self-pay | Admitting: Family Medicine

## 2016-04-05 VITALS — BP 140/82 | HR 72 | Temp 97.6°F | Ht 63.0 in | Wt 170.6 lb

## 2016-04-05 DIAGNOSIS — M7070 Other bursitis of hip, unspecified hip: Secondary | ICD-10-CM | POA: Insufficient documentation

## 2016-04-05 DIAGNOSIS — M7071 Other bursitis of hip, right hip: Secondary | ICD-10-CM | POA: Diagnosis not present

## 2016-04-05 MED ORDER — BETAMETHASONE SOD PHOS & ACET 6 (3-3) MG/ML IJ SUSP
6.0000 mg | Freq: Once | INTRAMUSCULAR | Status: AC
  Administered 2016-04-05: 6 mg via INTRAMUSCULAR

## 2016-04-05 NOTE — Progress Notes (Signed)
Subjective:  Patient ID: Elizabeth Wu, female    DOB: May 11, 1951  Age: 65 y.o. MRN: [id number]  CC: Leg Pain   HPI Elizabeth Wu presents for 2 weeks of increasing RLE pain. Points to lateral thigh at level of greater trochanter, slightly more distal and lateral. Pt. Reports pins andneedles, dull ache constantly. Moderate pain limits mobility. Laying on right side is painful, interrupts sleep. Okay to lay on left.   History Elizabeth Wu has a past medical history of Osteopenia; Allergy; Hyperlipidemia; and DJD (degenerative joint disease).   Elizabeth Wu has past surgical history that includes Appendectomy; Tonsillectomy; Hernia repair; and Tonsillectomy.   Her family history includes Alzheimer's disease in her mother; Cancer in her father; Diabetes in her sister; Heart disease in her brother.Elizabeth Wu reports that Elizabeth Wu has been smoking Cigarettes.  Elizabeth Wu has a 20 pack-year smoking history. Elizabeth Wu has never used smokeless tobacco. Elizabeth Wu reports that Elizabeth Wu does not drink alcohol or use illicit drugs.    ROS Review of Systems  Constitutional: Negative for fever, activity change and appetite change.  HENT: Negative for congestion, rhinorrhea and sore throat.   Eyes: Negative for visual disturbance.  Respiratory: Negative for cough and shortness of breath.   Cardiovascular: Negative for chest pain and palpitations.  Gastrointestinal: Negative for nausea, abdominal pain and diarrhea.  Genitourinary: Negative for dysuria.  Musculoskeletal: Positive for myalgias (RLE) and arthralgias. Negative for back pain and joint swelling.    Objective:  BP 140/82 mmHg  Pulse 72  Temp(Src) 97.6 F (36.4 C) (Oral)  Ht '5\' 3"'$  (1.6 m)  Wt 170 lb 9.6 oz (77.384 kg)  BMI 30.23 kg/m2  SpO2 100%  BP Readings from Last 3 Encounters:  04/05/16 140/82  03/08/16 131/76  01/04/16 127/83    Wt Readings from Last 3 Encounters:  04/05/16 170 lb 9.6 oz (77.384 kg)  03/08/16 172 lb 6.4 oz (78.2 kg)  01/04/16 173 lb (78.472 kg)      Physical Exam  Constitutional: Elizabeth Wu is oriented to person, place, and time. Elizabeth Wu appears well-developed and well-nourished.  HENT:  Head: Normocephalic.  Cardiovascular: Normal rate and regular rhythm.   No murmur heard. Pulmonary/Chest: Effort normal and breath sounds normal.  Musculoskeletal: Normal range of motion. Elizabeth Wu exhibits tenderness (right trochanteric bursa).  Neurological: Elizabeth Wu is alert and oriented to person, place, and time. Elizabeth Wu has normal reflexes. Coordination normal.     Lab Results  Component Value Date   WBC 9.0 12/02/2014   HGB 12.9 12/02/2014   HCT 39.5 12/02/2014   PLT 343 03/25/2010   GLUCOSE 96 11/04/2015   CHOL 239* 11/04/2015   TRIG 164* 11/04/2015   HDL 47 11/04/2015   LDLCALC 159* 11/04/2015   ALT 12 11/04/2015   AST 13 11/04/2015   NA 140 11/04/2015   K 5.0 11/04/2015   CL 99 11/04/2015   CREATININE 0.76 11/04/2015   BUN 9 11/04/2015   CO2 25 11/04/2015   TSH 1.930 12/02/2014    No results found.  Assessment & Plan:   Elizabeth Wu was seen today for leg pain.  Diagnoses and all orders for this visit:  Bursitis, hip, right -     betamethasone acetate-betamethasone sodium phosphate (CELESTONE) injection 6 mg; Inject 1 mL (6 mg total) into the muscle once.      I am having Elizabeth Wu maintain her Fish Oil, simvastatin, fluticasone, and ondansetron. We will continue to administer betamethasone acetate-betamethasone sodium phosphate.  Meds ordered this encounter  Medications  . betamethasone acetate-betamethasone sodium phosphate (  CELESTONE) injection 6 mg    Sig:      Follow-up: Return if symptoms worsen or fail to improve.  Claretta Fraise, M.D.

## 2016-04-15 ENCOUNTER — Encounter: Payer: Self-pay | Admitting: Family Medicine

## 2016-04-15 ENCOUNTER — Ambulatory Visit (INDEPENDENT_AMBULATORY_CARE_PROVIDER_SITE_OTHER): Payer: 59 | Admitting: Family Medicine

## 2016-04-15 VITALS — BP 141/78 | HR 72 | Temp 97.9°F | Ht 63.0 in | Wt 166.0 lb

## 2016-04-15 DIAGNOSIS — M25551 Pain in right hip: Secondary | ICD-10-CM

## 2016-04-15 MED ORDER — AMOXICILLIN 875 MG PO TABS: 875.0000 mg | ORAL_TABLET | Freq: Two times a day (BID) | ORAL | Status: DC

## 2016-04-15 NOTE — Progress Notes (Signed)
   Subjective:    Patient ID: Elizabeth Wu, female    DOB: May 29, 1951, 65 y.o.   MRN: [id number]  HPI Patient here today for continued right hip pain. She was seen about a week ago and she is not feeling better. The injection last week was intramuscular rather than into the bursa or area of maximal tenderness. The effect has been minimal. I still believe that the diagnosis is correct based on my exam today. There was some question that whether or not she needed to be x-rayed but based on her history and exam I feel she has trochanteric bursitis.      Patient Active Problem List   Diagnosis Date Noted  . Bursitis, hip 04/05/2016  . Annual physical exam 11/04/2015  . Meralgia paraesthetica 04/22/2014  . 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 Hyperlipidemia 05/31/2013  . Osteopenia 05/31/2013  . DJD (degenerative joint disease) 05/31/2013   Outpatient Encounter Prescriptions as of 04/15/2016  Medication Sig  . fluticasone (FLONASE) 50 MCG/ACT nasal spray Place 1 spray into both nostrils 2 (two) times daily as needed for allergies or rhinitis.  . Omega-3 Fatty Acids (FISH OIL) 1000 MG CAPS Take 1 capsule by mouth daily.  . ondansetron (ZOFRAN ODT) 4 MG disintegrating tablet Take 1 tablet (4 mg total) by mouth every 8 (eight) hours as needed for nausea or vomiting.  . simvastatin (ZOCOR) 20 MG tablet Take 1 tablet (20 mg total) by mouth at bedtime.   No facility-administered encounter medications on file as of 04/15/2016.      Review of Systems  Constitutional: Negative.   HENT: Negative.   Eyes: Negative.   Respiratory: Negative.   Cardiovascular: Negative.   Gastrointestinal: Negative.   Endocrine: Negative.   Genitourinary: Negative.   Musculoskeletal: Positive for arthralgias (right hip pain).  Skin: Negative.   Allergic/Immunologic: Negative.   Neurological: Negative.   Hematological: Negative.   Psychiatric/Behavioral: Negative.        Objective:   Physical Exam  HENT:  Head: Normocephalic.  Mouth/Throat: Oropharynx is clear and moist.  There is maxillary sinus tenderness with green drainage and sputum.  Musculoskeletal:  Right greater trochanter was palpated in the area of maximal tenderness was localized and injected with Depo-Medrol and Marcaine 1-1. She tolerated this well.   BP 141/78 mmHg  Pulse 72  Temp(Src) 97.9 F (36.6 C) (Oral)  Ht '5\' 3"'$  (1.6 m)  Wt 166 lb (75.297 kg)  BMI 29.41 kg/m2        Assessment & Plan:

## 2016-05-04 ENCOUNTER — Encounter: Payer: Self-pay | Admitting: Family Medicine

## 2016-05-04 ENCOUNTER — Ambulatory Visit (INDEPENDENT_AMBULATORY_CARE_PROVIDER_SITE_OTHER): Payer: 59 | Admitting: Family Medicine

## 2016-05-04 VITALS — BP 133/73 | HR 69 | Temp 97.7°F | Ht 63.0 in | Wt 168.4 lb

## 2016-05-04 DIAGNOSIS — M7071 Other bursitis of hip, right hip: Secondary | ICD-10-CM | POA: Diagnosis not present

## 2016-05-04 DIAGNOSIS — E785 Hyperlipidemia, unspecified: Secondary | ICD-10-CM | POA: Diagnosis not present

## 2016-05-04 NOTE — Progress Notes (Signed)
   Subjective:    Patient ID: Elizabeth Wu, female    DOB: [birth year], 65 y.o.   MRN: [id number]  HPI 65 year old female who returns for follow-up hip pain. Since she received a localized injection and the right greater trochanter she is reporting less pain and improved gait and walking. She still has some cramping in her legs and I wonder about the contribution of her statin to this symptom. We discussed this at some length and my recommendation is to hold the statin for 2 weeks and call us with a progress report.  Patient Active Problem List   Diagnosis Date Noted  . Bursitis, hip 04/05/2016  . Annual physical exam 11/04/2015  . Meralgia paraesthetica 04/22/2014  . 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 Hyperlipidemia 05/31/2013  . Osteopenia 05/31/2013  . DJD (degenerative joint disease) 05/31/2013   Outpatient Encounter Prescriptions as of 05/04/2016  Medication Sig  . Omega-3 Fatty Acids (FISH OIL) 1000 MG CAPS Take 1 capsule by mouth daily.  . simvastatin (ZOCOR) 20 MG tablet Take 1 tablet (20 mg total) by mouth at bedtime.  . fluticasone (FLONASE) 50 MCG/ACT nasal spray Place 1 spray into both nostrils 2 (two) times daily as needed for allergies or rhinitis. (Patient not taking: Reported on 05/04/2016)  . ondansetron (ZOFRAN ODT) 4 MG disintegrating tablet Take 1 tablet (4 mg total) by mouth every 8 (eight) hours as needed for nausea or vomiting. (Patient not taking: Reported on 05/04/2016)  . [DISCONTINUED] amoxicillin (AMOXIL) 875 MG tablet Take 1 tablet (875 mg total) by mouth 2 (two) times daily.   No facility-administered encounter medications on file as of 05/04/2016.      Review of Systems  Constitutional: Negative.   Respiratory: Negative.   Cardiovascular: Negative.   Gastrointestinal: Negative.   Genitourinary: Negative.   Musculoskeletal: Positive for myalgias, arthralgias and gait problem.  Neurological: Negative.        Objective:   Physical Exam    Constitutional: She is oriented to person, place, and time. She appears well-developed and well-nourished.  Pulmonary/Chest: Effort normal.  Musculoskeletal:  Patient does not report any tenderness with palpation around the right hip joint. With compression of muscles in both lower and upper extremities are is some tenderness.  Neurological: She is alert and oriented to person, place, and time.   BP 133/73 mmHg  Pulse 69  Temp(Src) 97.7 F (36.5 C) (Oral)  Ht '5\' 3"'$  (1.6 m)  Wt 168 lb 6.4 oz (76.386 kg)  BMI 29.84 kg/m2        Assessment & Plan:  1. Bursitis, hip, right Problem has improved since the local injection. Since she still has some symptoms will ask her to hold statin to see if that will help more  2. Hyperlipidemia We are holding statin at this point and will reassess lipids at a later date if needed  Wardell Honour MD

## 2016-05-18 ENCOUNTER — Telehealth: Payer: Self-pay | Admitting: Family Medicine

## 2016-05-18 NOTE — Telephone Encounter (Signed)
Rectally I will have to request another office visit. The holding of the statin was to see if this might help her hip pain and I do not believe we talked about her hands. She may have some arthritis. Will need to assess

## 2016-05-23 ENCOUNTER — Ambulatory Visit (INDEPENDENT_AMBULATORY_CARE_PROVIDER_SITE_OTHER): Payer: 59 | Admitting: Family Medicine

## 2016-05-23 ENCOUNTER — Encounter: Payer: Self-pay | Admitting: Family Medicine

## 2016-05-23 VITALS — BP 115/80 | HR 70 | Temp 97.3°F | Ht 63.0 in | Wt 167.8 lb

## 2016-05-23 DIAGNOSIS — M19041 Primary osteoarthritis, right hand: Secondary | ICD-10-CM | POA: Diagnosis not present

## 2016-05-23 DIAGNOSIS — M19042 Primary osteoarthritis, left hand: Secondary | ICD-10-CM | POA: Diagnosis not present

## 2016-05-23 DIAGNOSIS — E785 Hyperlipidemia, unspecified: Secondary | ICD-10-CM | POA: Diagnosis not present

## 2016-05-23 MED ORDER — MELOXICAM 7.5 MG PO TABS: 7.5000 mg | ORAL_TABLET | Freq: Every day | ORAL | Status: DC

## 2016-05-23 NOTE — Progress Notes (Signed)
   Subjective:    Patient ID: Elizabeth Wu, female    DOB: 1951/03/03, 65 y.o.   MRN: [id number]  HPI Patient here today with complaints of cramps in legs and hands. He had been in the process of trying her off the statin to see if the drugs were causing some of her muscular aches and pains but she has a difficult time telling if it has made a difference. The symptoms in her hands really sound more arthritic with morning stiffness and cramping in her fingers. She also has some intermittent constipation and reflux with nausea and vomiting symptoms. Her weight is stable. She watches what she eats and tries to avoid tomato-based products and spicy foods.           Patient Active Problem List   Diagnosis Date Noted  . Bursitis, hip 04/05/2016  . Annual physical exam 11/04/2015  . Meralgia paraesthetica 04/22/2014  . 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 Hyperlipidemia 05/31/2013  . Osteopenia 05/31/2013  . DJD (degenerative joint disease) 05/31/2013   Outpatient Encounter Prescriptions as of 05/23/2016  Medication Sig  . Omega-3 Fatty Acids (FISH OIL) 1000 MG CAPS Take 1 capsule by mouth daily.  . fluticasone (FLONASE) 50 MCG/ACT nasal spray Place 1 spray into both nostrils 2 (two) times daily as needed for allergies or rhinitis. (Patient not taking: Reported on 05/23/2016)  . ondansetron (ZOFRAN ODT) 4 MG disintegrating tablet Take 1 tablet (4 mg total) by mouth every 8 (eight) hours as needed for nausea or vomiting. (Patient not taking: Reported on 05/04/2016)  . simvastatin (ZOCOR) 20 MG tablet Take 1 tablet (20 mg total) by mouth at bedtime. (Patient not taking: Reported on 05/23/2016)   No facility-administered encounter medications on file as of 05/23/2016.     Review of Systems  Constitutional: Negative.   HENT: Negative.   Eyes: Negative.   Respiratory: Negative.   Cardiovascular: Negative.   Gastrointestinal: Negative.   Endocrine: Negative.   Genitourinary:  Negative.   Musculoskeletal:       Leg cramps and hand cramps  Skin: Negative.   Allergic/Immunologic: Negative.   Neurological: Negative.   Hematological: Negative.   Psychiatric/Behavioral: Negative.        Objective:   Physical Exam  Constitutional: She appears well-developed and well-nourished.  Cardiovascular: Normal rate.   Pulmonary/Chest: Effort normal.  Abdominal: Soft. There is no tenderness.  Musculoskeletal:  Good grip strength No swelling of small joints in hands or fingers that I detect   BP 115/80 mmHg  Pulse 70  Temp(Src) 97.3 F (36.3 C) (Oral)  Ht '5\' 3"'$  (1.6 m)  Wt 167 lb 12.8 oz (76.114 kg)  BMI 29.73 kg/m2        Assessment & Plan:  1. Primary osteoarthritis of both hands Will try meloxicam 7.5 mg for one month and reassess  2. Hyperlipidemia Patient is to restart simvastatin and again observe if symptoms worsen. Enlisting to her history I think she probably needs to be on some medicine to help control her lipids.  Wardell Honour MD

## 2016-09-13 ENCOUNTER — Encounter: Payer: Self-pay | Admitting: Family Medicine

## 2016-09-13 ENCOUNTER — Ambulatory Visit (INDEPENDENT_AMBULATORY_CARE_PROVIDER_SITE_OTHER): Payer: 59

## 2016-09-13 ENCOUNTER — Ambulatory Visit (INDEPENDENT_AMBULATORY_CARE_PROVIDER_SITE_OTHER): Payer: 59 | Admitting: Family Medicine

## 2016-09-13 VITALS — BP 159/89 | HR 74 | Temp 97.4°F | Ht 63.0 in | Wt 167.2 lb

## 2016-09-13 DIAGNOSIS — M546 Pain in thoracic spine: Secondary | ICD-10-CM

## 2016-09-13 DIAGNOSIS — K8020 Calculus of gallbladder without cholecystitis without obstruction: Secondary | ICD-10-CM | POA: Insufficient documentation

## 2016-09-13 DIAGNOSIS — Z23 Encounter for immunization: Secondary | ICD-10-CM | POA: Diagnosis not present

## 2016-09-13 IMAGING — DX DG THORACIC SPINE 2V
2 series · 2 of 2 positions shown · non-contrast
Comparison: None.

ADDENDUM:
CT from [DATE] of the abdomen which includes lower thoracic
spine have been made available after merging accounts. No
significant change in the appearance of the T9 vertebral body.
CLINICAL DATA: Thoracic pain without history of trauma

EXAM:
THORACIC SPINE 2 VIEWS

[t-spine ap]
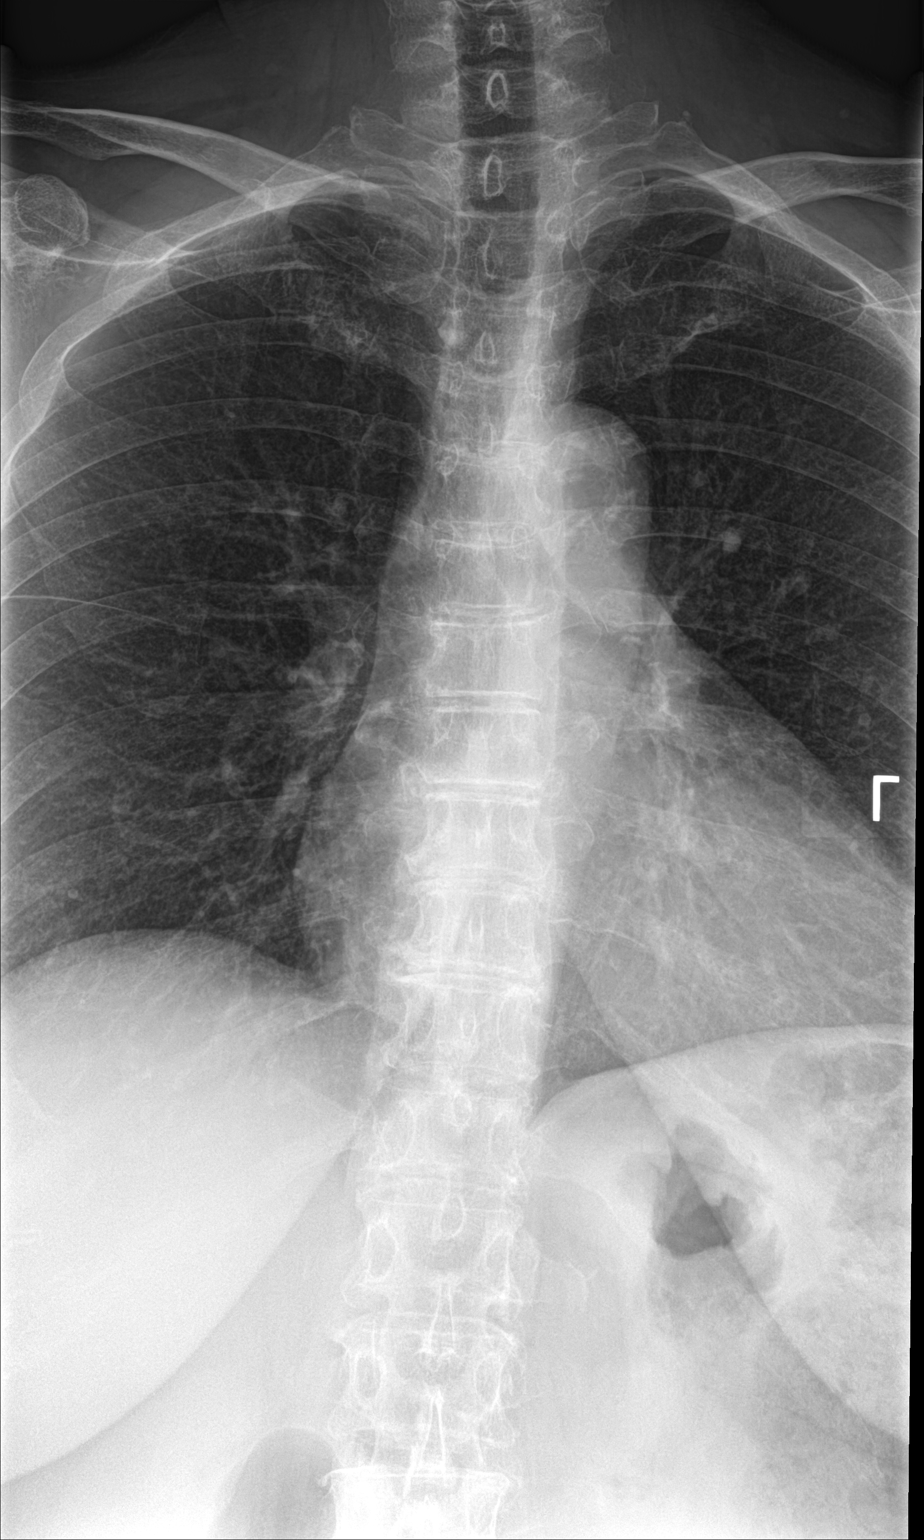

[t-spine lat]
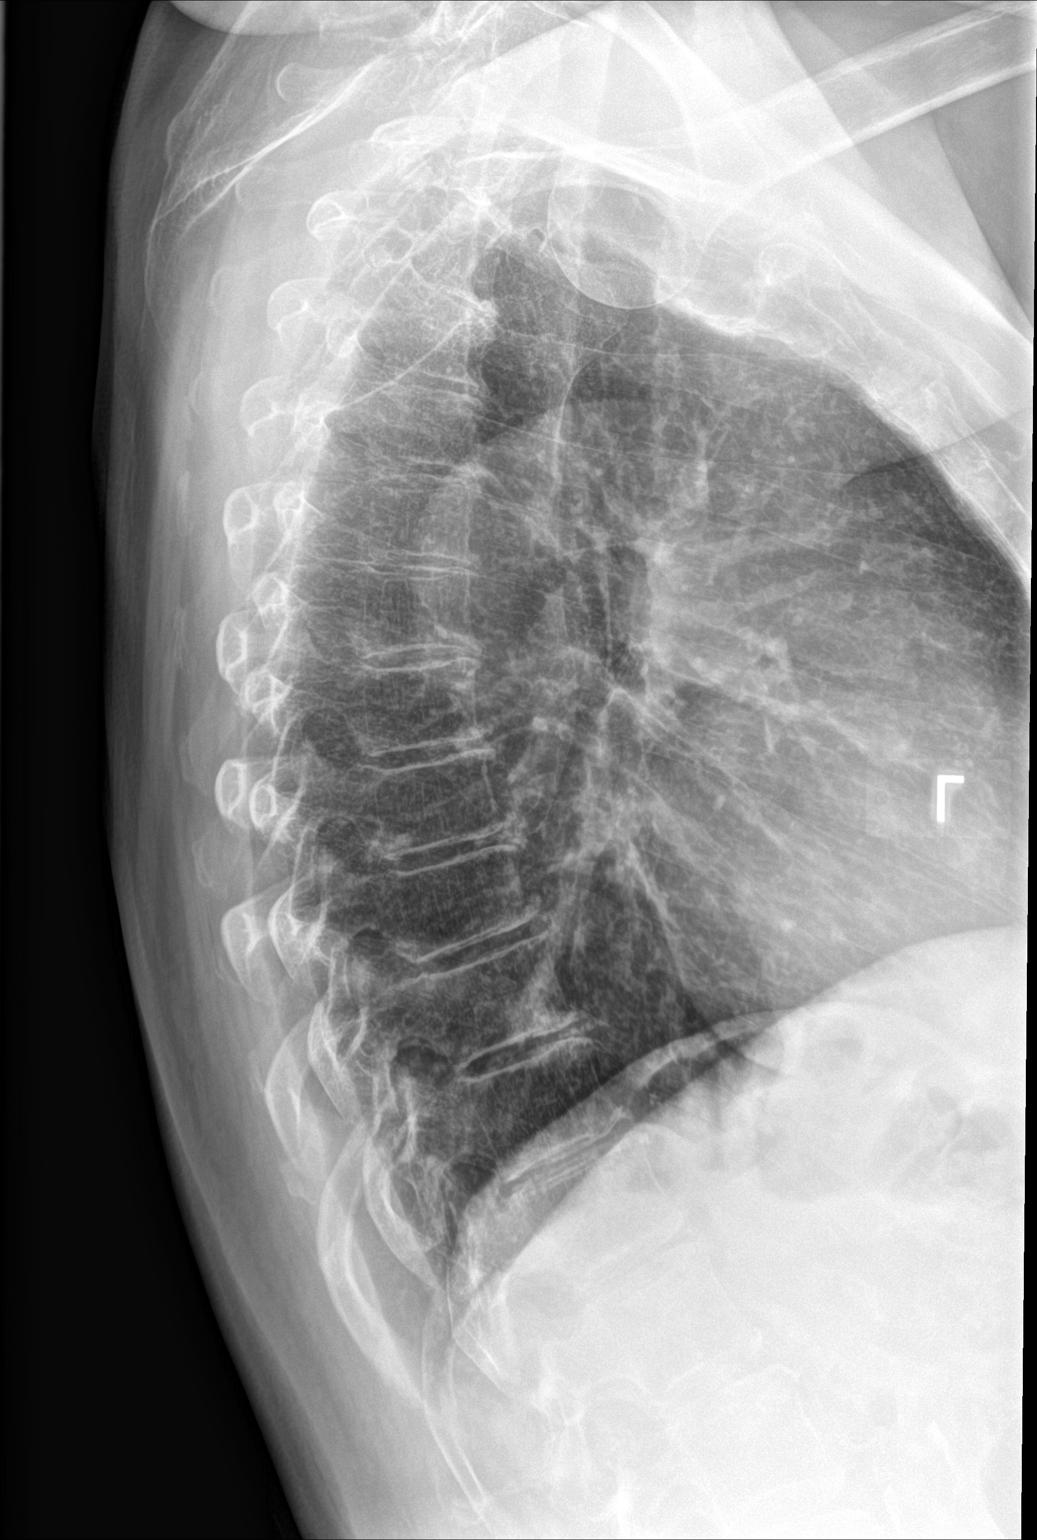

[2 of 2 positions shown; findings below may reference images not displayed]

FINDINGS: There is no evidence of acute thoracic spine fracture. Minimal
likely physiologic anterior wedging of T9 relative to adjacent T8
and T10 vertebral bodies. Alignment is normal. Small anterior
marginal osteophytes are noted at multiple levels along the thoracic
spine. Bones are slightly osteopenic with accentuated vertical
trabecular markings.
IMPRESSION: No acute osseous abnormality of the thoracic spine.

## 2016-09-13 IMAGING — DX DG LUMBAR SPINE 2-3V
2 series · 2 of 2 positions shown · non-contrast
Comparison: CT from [DATE]

CLINICAL DATA: Low back pain

EXAM:
LUMBAR SPINE - 2-3 VIEW

[l-spine ap]
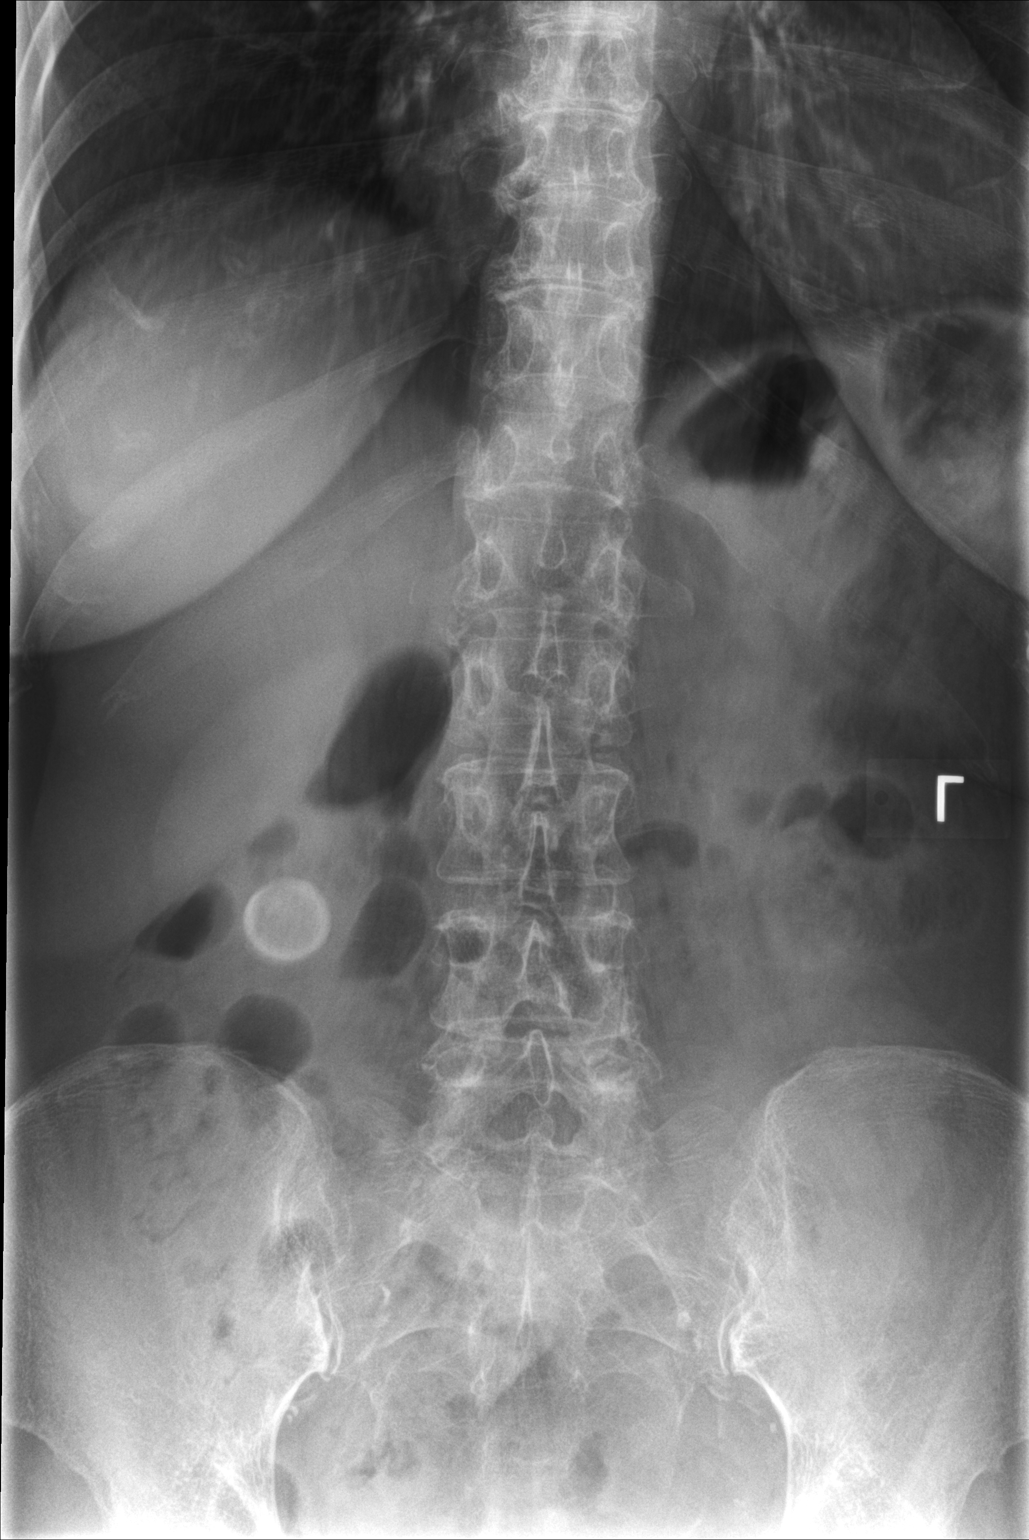

[l-spine lat]
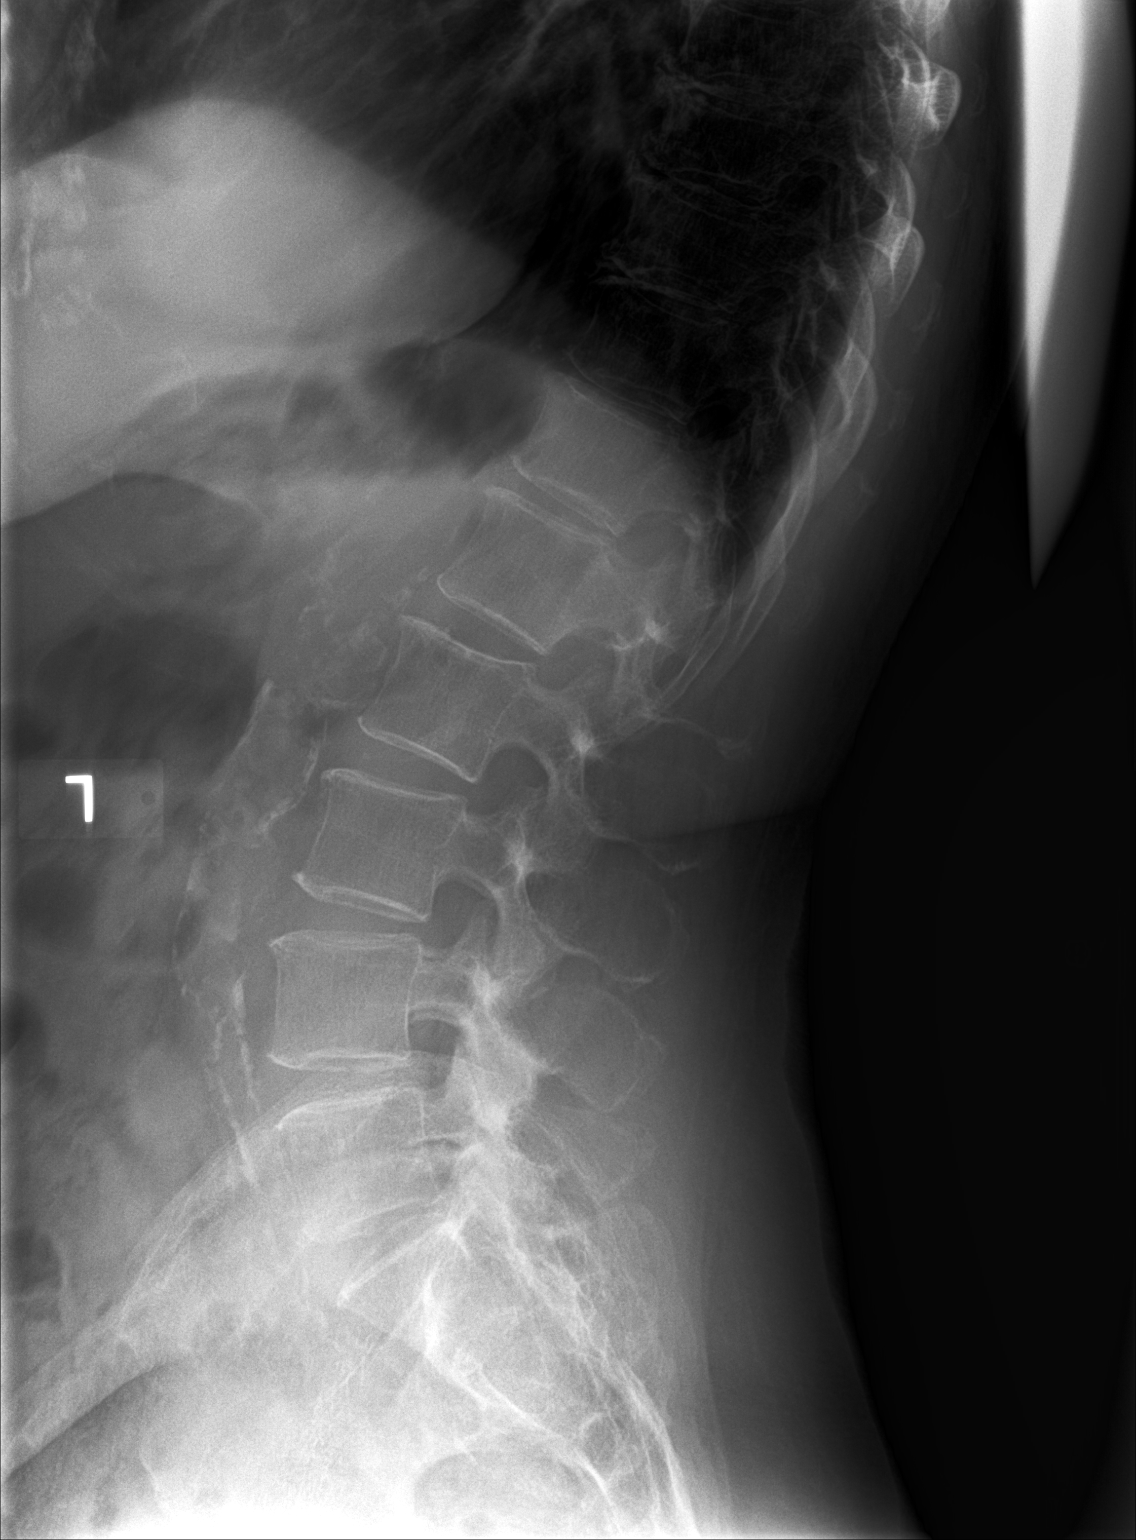

[2 of 2 positions shown; findings below may reference images not displayed]

FINDINGS: Rounded calcified gallstone is seen in the right hemi abdomen
measuring up to 2.5 cm radiographically, previously 1.4 cm on prior
CT. There is sclerosis of the L4-5 and L5-S1 facets. No significant
disc space narrowing. No acute fracture, spondylolysis nor
spondylolisthesis. Atherosclerosis of the abdominal aorta is noted
without definite aneurysm.
IMPRESSION: Lower lumbar degenerative facet arthropathy from L4 through S1.

Cholelithiasis.

## 2016-09-13 MED ORDER — DICLOFENAC SODIUM 75 MG PO TBEC: 75.0000 mg | DELAYED_RELEASE_TABLET | Freq: Two times a day (BID) | ORAL | 0 refills | Status: DC

## 2016-09-13 NOTE — Progress Notes (Signed)
   HPI  Patient presents today here with back pain.  She describes 2 days of lower thoracic/upper lumbar midline pain, described as sharp in nature and persistent, worse with sitting for long period of time, bending, or lifting.  She has missed one day of work due to pain, her work involves lots of walking, bending, and lifting.  She denies any leg weakness, bowel or bladder dysfunction, arm weakness, or radiation of pain.  She does not have any fevers, chills, sweats, or history of malignancy  It started Sunday when she was standing up from a chair suddenly.  PMH: Smoking status noted ROS: Per HPI  Objective: BP (!) 159/89   Pulse 74   Temp 97.4 F (36.3 C) (Oral)   Ht '5\' 3"'$  (1.6 m)   Wt 167 lb 3.2 oz (75.8 kg)   BMI 29.62 kg/m  Gen: NAD, alert, cooperative with exam HEENT: NCAT CV: RRR, good S1/S2, no murmur Resp: CTABL, no wheezes, non-labored Ext: No edema, warm Neuro: Alert and oriented, strength 5/5 and sensation intact in bilateral lower extremities, 2+ patellar reflexes bilaterally msk: No tenderness to palpation of the midline spine or paraspinal muscles in the thoracic or lumbar spine.  Assessment and plan:  # Midline thoracic/lumbar back pain Acute pain for 2 days, exam is very reassuring Failed Aleve over-the-counter dosing, 1 pill 3 or 4 times. Heat has helped slightly Recommended diclofenac scheduled 3-5 days, ice Plain films, has history of osteopenia, rule out compression fracture Return to clinic if not improving or worsening.      Orders Placed This Encounter  Procedures  . DG Thoracic Spine 2 View    Standing Status:   Future    Standing Expiration Date:   11/13/2017    Order Specific Question:   Reason for Exam (SYMPTOM  OR DIAGNOSIS REQUIRED)    Answer:   lower thoracic/upper lumbar pain    Order Specific Question:   Preferred imaging location?    Answer:   Internal  . DG Lumbar Spine 2-3 Views    Standing Status:   Future    Standing  Expiration Date:   11/13/2017    Order Specific Question:   Reason for Exam (SYMPTOM  OR DIAGNOSIS REQUIRED)    Answer:   lower thoracic/upper lumbar pain    Order Specific Question:   Preferred imaging location?    Answer:   Internal  . Flu Vaccine QUAD 36+ mos IM    Meds ordered this encounter  Medications  . diclofenac (VOLTAREN) 75 MG EC tablet    Sig: Take 1 tablet (75 mg total) by mouth 2 (two) times daily.    Dispense:  20 tablet    Refill:  0    Laroy Apple, MD [facility] 09/13/2016, 11:48 AM

## 2016-09-13 NOTE — Patient Instructions (Signed)
Great to meet you!  Try diclofenac 1 pill twice daily for about 5 days. Rest, apply ice to the affected area, stretch routinely   Please call or come back if your pain suddenly worsens or does not get better

## 2016-11-10 ENCOUNTER — Ambulatory Visit (INDEPENDENT_AMBULATORY_CARE_PROVIDER_SITE_OTHER): Payer: 59 | Admitting: Family Medicine

## 2016-11-10 ENCOUNTER — Encounter: Payer: Self-pay | Admitting: Family Medicine

## 2016-11-10 VITALS — BP 121/63 | HR 80 | Temp 97.6°F | Ht 63.0 in | Wt 168.0 lb

## 2016-11-10 DIAGNOSIS — Z0000 Encounter for general adult medical examination without abnormal findings: Secondary | ICD-10-CM | POA: Diagnosis not present

## 2016-11-10 DIAGNOSIS — E7800 Pure hypercholesterolemia, unspecified: Secondary | ICD-10-CM

## 2016-11-10 DIAGNOSIS — Z1211 Encounter for screening for malignant neoplasm of colon: Secondary | ICD-10-CM

## 2016-11-10 DIAGNOSIS — R300 Dysuria: Secondary | ICD-10-CM

## 2016-11-10 LAB — URINALYSIS
Bilirubin, UA: NEGATIVE
Glucose, UA: NEGATIVE
Ketones, UA: NEGATIVE
Nitrite, UA: NEGATIVE
Protein, UA: NEGATIVE
Specific Gravity, UA: 1.015 (ref 1.005–1.030)
Urobilinogen, Ur: 0.2 mg/dL (ref 0.2–1.0)
pH, UA: 7 (ref 5.0–7.5)

## 2016-11-10 MED ORDER — SIMVASTATIN 20 MG PO TABS: 20.0000 mg | ORAL_TABLET | Freq: Every day | ORAL | 3 refills | Status: DC

## 2016-11-10 NOTE — Patient Instructions (Signed)
Continue current medications. Continue good therapeutic lifestyle changes which include good diet and exercise. Fall precautions discussed with patient. If an FOBT was given today- please return it to our front desk. If you are over 65 years old - you may need Prevnar 13 or the adult Pneumonia vaccine.  **Flu shots are available--- please call and schedule a FLU-CLINIC appointment**  After your visit with us today you will receive a survey in the mail or online from [employer] regarding your care with us. Please take a moment to fill this out. Your feedback is very important to us as you can help us better understand your patient needs as well as improve your experience and satisfaction. WE CARE ABOUT YOU!!!    

## 2016-11-10 NOTE — Addendum Note (Signed)
Addended by: Thana Ates on: 11/10/2016 12:15 PM   Modules accepted: Orders

## 2016-11-10 NOTE — Progress Notes (Signed)
   Subjective:    Patient ID: Elizabeth Wu, female    DOB: [birth year], 65 y.o.   MRN: [id number]  HPI Patient is here today for annual wellness exam and follow up of chronic medical problems which includes hyperlipidemia.  Only complaints are some sinus issues. Does not involve pain colored drainage or cough. She also complains of some lower back pain. X-rays done several months ago showed some degenerative arthritis.   Patient Active Problem List   Diagnosis Date Noted  . Cholelithiases 09/13/2016  . Bursitis, hip 04/05/2016  . Annual physical exam 11/04/2015  . Meralgia paraesthetica 04/22/2014  . 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 Hyperlipidemia 05/31/2013  . Osteopenia 05/31/2013  . DJD (degenerative joint disease) 05/31/2013   Outpatient Encounter Prescriptions as of 11/10/2016  Medication Sig  . diclofenac (VOLTAREN) 75 MG EC tablet Take 1 tablet (75 mg total) by mouth 2 (two) times daily.  . fluticasone (FLONASE) 50 MCG/ACT nasal spray Place 1 spray into both nostrils 2 (two) times daily as needed for allergies or rhinitis.  . Omega-3 Fatty Acids (FISH OIL) 1000 MG CAPS Take 1 capsule by mouth daily.  . simvastatin (ZOCOR) 20 MG tablet Take 1 tablet (20 mg total) by mouth at bedtime.   No facility-administered encounter medications on file as of 11/10/2016.       Review of Systems  Constitutional: Negative.   HENT: Positive for postnasal drip and sinus pressure.   Eyes: Negative.   Respiratory: Negative.   Cardiovascular: Negative.   Gastrointestinal: Negative.   Endocrine: Negative.   Genitourinary: Negative.   Musculoskeletal: Negative.   Skin: Negative.   Allergic/Immunologic: Negative.   Neurological: Negative.   Hematological: Negative.   Psychiatric/Behavioral: Negative.        Objective:   Physical Exam  Constitutional: She is oriented to person, place, and time. She appears well-developed and well-nourished.  HENT:  Head:  Normocephalic.  Right Ear: External ear normal.  Left Ear: External ear normal.  Mouth/Throat: Oropharynx is clear and moist.  Eyes: Pupils are equal, round, and reactive to light.  Cardiovascular: Normal rate, regular rhythm and normal heart sounds.   Pulmonary/Chest: Effort normal and breath sounds normal.  Abdominal: Soft.  Musculoskeletal: Normal range of motion.  Neurological: She is alert and oriented to person, place, and time. She has normal reflexes.  Psychiatric: She has a normal mood and affect. Her behavior is normal.   BP 121/63 (BP Location: Left Arm)   Pulse 80   Temp 97.6 F (36.4 C) (Oral)   Ht _0  (1.6 m)   Wt 168 lb (76.2 kg)   BMI 29.76 kg/m         Assessment & Plan:  1. Annual physical exam No findings on exam. Patient is overweight with BMI 29. She was told she had gallstones but she is not symptomatic at this time and she has no tenderness over the right upper quadrant - Lipid panel - CMP14+EGFR  2. Pure hypercholesterolemia Lipids were not at goal when last checked 1 year ago. Statin dose was increased at that time. - Lipid  Wardell Honour MD

## 2016-11-11 LAB — CMP14+EGFR
ALT: 9 IU/L (ref 0–32)
AST: 11 IU/L (ref 0–40)
Albumin/Globulin Ratio: 1.7 (ref 1.2–2.2)
Albumin: 4.3 g/dL (ref 3.6–4.8)
Alkaline Phosphatase: 112 IU/L (ref 39–117)
BUN/Creatinine Ratio: 13 (ref 12–28)
BUN: 10 mg/dL (ref 8–27)
Bilirubin Total: 0.7 mg/dL (ref 0.0–1.2)
CO2: 25 mmol/L (ref 18–29)
Calcium: 9.4 mg/dL (ref 8.7–10.3)
Chloride: 101 mmol/L (ref 96–106)
Creatinine, Ser: 0.78 mg/dL (ref 0.57–1.00)
GFR calc Af Amer: 92 mL/min/{1.73_m2} (ref >59)
GFR calc non Af Amer: 80 mL/min/{1.73_m2} (ref >59)
Globulin, Total: 2.6 g/dL (ref 1.5–4.5)
Glucose: 93 mg/dL (ref 65–99)
Potassium: 4.5 mmol/L (ref 3.5–5.2)
Sodium: 142 mmol/L (ref 134–144)
Total Protein: 6.9 g/dL (ref 6.0–8.5)

## 2016-11-11 LAB — LIPID PANEL
Chol/HDL Ratio: 4.6 ratio units — ABNORMAL HIGH (ref 0.0–4.4)
Cholesterol, Total: 198 mg/dL (ref 100–199)
HDL: 43 mg/dL (ref >39)
LDL Calculated: 126 mg/dL — ABNORMAL HIGH (ref 0–99)
Triglycerides: 147 mg/dL (ref 0–149)
VLDL Cholesterol Cal: 29 mg/dL (ref 5–40)

## 2016-11-11 LAB — URINE CULTURE

## 2017-03-20 ENCOUNTER — Encounter: Payer: 59 | Admitting: *Deleted

## 2017-07-31 ENCOUNTER — Encounter: Payer: Self-pay | Admitting: Family Medicine

## 2017-07-31 ENCOUNTER — Ambulatory Visit (INDEPENDENT_AMBULATORY_CARE_PROVIDER_SITE_OTHER): Payer: 59 | Admitting: Family Medicine

## 2017-07-31 VITALS — BP 144/77 | HR 82 | Temp 98.4°F | Ht 63.0 in | Wt 165.0 lb

## 2017-07-31 DIAGNOSIS — J0100 Acute maxillary sinusitis, unspecified: Secondary | ICD-10-CM | POA: Diagnosis not present

## 2017-07-31 MED ORDER — AMOXICILLIN-POT CLAVULANATE 875-125 MG PO TABS: 1.0000 | ORAL_TABLET | Freq: Two times a day (BID) | ORAL | 0 refills | Status: DC

## 2017-07-31 MED ORDER — BENZONATATE 100 MG PO CAPS: 100.0000 mg | ORAL_CAPSULE | Freq: Three times a day (TID) | ORAL | 0 refills | Status: DC | PRN

## 2017-07-31 NOTE — Progress Notes (Signed)
Chief Complaint  Patient presents with  . Sinus Problem    pt here today c/o sinus pressure, sneezing, coughing and congestion    HPI  Patient presents today for Symptoms include  facial pain, frontal HA, nasal congestion, non productive cough, post nasal drip and sinus pressure. There is no fever, chills, or sweats. Onset of symptoms was 5 days ago, gradually worsening since that time.    PMH: Smoking status noted ROS: Per HPI  Objective: BP (!) 144/77   Pulse 82   Temp 98.4 F (36.9 C) (Oral)   Ht 5\' 3"  (1.6 m)   Wt 165 lb (74.8 kg)   BMI 29.23 kg/m  Gen: NAD, alert, cooperative with exam HEENT: NCAT, EOMI, PERRLNasal passages swollen, red. Pharynx has drainage.  CV: RRR, good S1/S2, no murmur Resp: CTABL, no wheezes, non-labored Ext: No edema, warm Neuro: Alert and oriented, No gross deficits  Assessment and plan:  1. Acute maxillary sinusitis, recurrence not specified     Meds ordered this encounter  Medications  . amoxicillin-clavulanate (AUGMENTIN) 875-125 MG tablet    Sig: Take 1 tablet by mouth 2 (two) times daily.    Dispense:  20 tablet    Refill:  0  . benzonatate (TESSALON) 100 MG capsule    Sig: Take 1 capsule (100 mg total) by mouth 3 (three) times daily as needed for cough.    Dispense:  30 capsule    Refill:  0    No orders of the defined types were placed in this encounter.   fronm Follow up as needed.  Claretta Fraise, MD

## 2017-07-31 NOTE — Patient Instructions (Signed)
Take over the counter Mucinex DM as needed for cough, congestion and headache.

## 2017-08-02 ENCOUNTER — Telehealth: Payer: Self-pay | Admitting: Family Medicine

## 2017-08-02 ENCOUNTER — Encounter: Payer: Self-pay | Admitting: *Deleted

## 2017-08-02 NOTE — Telephone Encounter (Signed)
Please review and advise.

## 2017-08-02 NOTE — Telephone Encounter (Signed)
Please  write and I will sign. Thanks, WS 

## 2017-08-02 NOTE — Telephone Encounter (Signed)
Aware. Note ready.

## 2017-08-11 ENCOUNTER — Encounter: Payer: Self-pay | Admitting: *Deleted

## 2017-09-15 ENCOUNTER — Telehealth: Payer: Self-pay | Admitting: Family Medicine

## 2017-09-15 ENCOUNTER — Other Ambulatory Visit: Payer: Self-pay | Admitting: Family Medicine

## 2017-09-15 DIAGNOSIS — Z0000 Encounter for general adult medical examination without abnormal findings: Secondary | ICD-10-CM

## 2017-09-15 NOTE — Telephone Encounter (Signed)
Labs placed  Laroy Apple, MD [facility] 09/15/2017, 4:40 PM

## 2017-09-15 NOTE — Telephone Encounter (Signed)
aware

## 2017-11-10 ENCOUNTER — Ambulatory Visit (INDEPENDENT_AMBULATORY_CARE_PROVIDER_SITE_OTHER): Payer: 59 | Admitting: Family Medicine

## 2017-11-10 ENCOUNTER — Encounter: Payer: Self-pay | Admitting: Family Medicine

## 2017-11-10 VITALS — BP 164/84 | HR 87 | Ht 63.0 in | Wt 168.0 lb

## 2017-11-10 DIAGNOSIS — E7800 Pure hypercholesterolemia, unspecified: Secondary | ICD-10-CM | POA: Diagnosis not present

## 2017-11-10 DIAGNOSIS — Z0000 Encounter for general adult medical examination without abnormal findings: Secondary | ICD-10-CM

## 2017-11-10 DIAGNOSIS — Z23 Encounter for immunization: Secondary | ICD-10-CM | POA: Diagnosis not present

## 2017-11-10 DIAGNOSIS — J321 Chronic frontal sinusitis: Secondary | ICD-10-CM | POA: Diagnosis not present

## 2017-11-10 MED ORDER — SIMVASTATIN 20 MG PO TABS: 20.0000 mg | ORAL_TABLET | Freq: Every day | ORAL | 3 refills | Status: DC

## 2017-11-10 MED ORDER — BENZONATATE 100 MG PO CAPS: 100.0000 mg | ORAL_CAPSULE | Freq: Three times a day (TID) | ORAL | 0 refills | Status: DC | PRN

## 2017-11-10 MED ORDER — CEFDINIR 300 MG PO CAPS: 300.0000 mg | ORAL_CAPSULE | Freq: Two times a day (BID) | ORAL | 0 refills | Status: DC

## 2017-11-10 NOTE — Patient Instructions (Addendum)
Come back for a blood pressure check with nursing in 2 weeks, if it is still elevated lets follow up in 4-6 weeks to consider medication.    Sinusitis, Adult Sinusitis is soreness and inflammation of your sinuses. Sinuses are hollow spaces in the bones around your face. They are located:  Around your eyes.  In the middle of your forehead.  Behind your nose.  In your cheekbones.  Your sinuses and nasal passages are lined with a stringy fluid (mucus). Mucus normally drains out of your sinuses. When your nasal tissues get inflamed or swollen, the mucus can get trapped or blocked so air cannot flow through your sinuses. This lets bacteria, viruses, and funguses grow, and that leads to infection. Follow these instructions at home: Medicines  Take, use, or apply over-the-counter and prescription medicines only as told by your doctor. These may include nasal sprays.  If you were prescribed an antibiotic medicine, take it as told by your doctor. Do not stop taking the antibiotic even if you start to feel better. Hydrate and Humidify  Drink enough water to keep your pee (urine) clear or pale yellow.  Use a cool mist humidifier to keep the humidity level in your home above 50%.  Breathe in steam for 10-15 minutes, 3-4 times a day or as told by your doctor. You can do this in the bathroom while a hot shower is running.  Try not to spend time in cool or dry air. Rest  Rest as much as possible.  Sleep with your head raised (elevated).  Make sure to get enough sleep each night. General instructions  Put a warm, moist washcloth on your face 3-4 times a day or as told by your doctor. This will help with discomfort.  Wash your hands often with soap and water. If there is no soap and water, use hand sanitizer.  Do not smoke. Avoid being around people who are smoking (secondhand smoke).  Keep all follow-up visits as told by your doctor. This is important. Contact a doctor if:  You have a  fever.  Your symptoms get worse.  Your symptoms do not get better within 10 days. Get help right away if:  You have a very bad headache.  You cannot stop throwing up (vomiting).  You have pain or swelling around your face or eyes.  You have trouble seeing.  You feel confused.  Your neck is stiff.  You have trouble breathing. This information is not intended to replace advice given to you by your health care provider. Make sure you discuss any questions you have with your health care provider. Document Released: 04/25/2008 Document Revised: 07/03/2016 Document Reviewed: 09/02/2015 Elsevier Interactive Patient Education  [employer].

## 2017-11-10 NOTE — Progress Notes (Signed)
   HPI  Patient presents today for an annual physical exam and sinus troubles.  Patient complains of sinus pain and pressure in the frontal sinuses, sneezing, congestion, drainage.  She states that Augmentin was not very helpful, she states that she is to improve with plain amoxicillin and Tussionex with codeine.  She reports good medication compliance with simvastatin. She denies any other concerns today. She denies fever, chills, sweats, shortness of breath, chest pain, or difficulty tolerating foods or fluids.  She is marginally interested in a colonoscopy  PMH: Smoking status noted ROS: Per HPI  Objective: BP (!) 164/84 (BP Location: Left Arm, Patient Position: Sitting, Cuff Size: Normal)   Pulse 87   Ht '5\' 3"'$  (1.6 m)   Wt 168 lb (76.2 kg)   BMI 29.76 kg/m  Gen: NAD, alert, cooperative with exam HEENT: NCAT, pharynx moist and clear, nares with swollen turbinates, TMs normal bilaterally but partially obscured by cerumen, tenderness to palpation of left-sided frontal sinus and less tenderness throughout the other sinuses. Abdomen: Soft, nontender, nondistended, no guarding CV: RRR, good S1/S2, no murmur Resp: CTABL, no wheezes, non-labored Ext: No edema, warm Neuro: Alert and oriented, No gross deficits, strength 4/5 and symmetric bilateral lower extremities, 2+ patellar tendon reflexes bilaterally  Assessment and plan:  #Annual physical exam Normal exam Labs today, fasting  #Hyperlipidemia Continue simvastatin, repeat labs today  #Chronic sinusitis, acute on chronic sinusitis Omnicef plus Tessalon  Asymptomatic cholelithiasis-discussed    Orders Placed This Encounter  Procedures  . Pneumococcal conjugate vaccine 13-valent  . Lipid panel  . CMP14+EGFR  . CBC with Differential/Platelet  . TSH  . Ambulatory referral to Gastroenterology    Referral Priority:   Routine    Referral Type:   Consultation    Referral Reason:   Specialty Services Required    Number  of Visits Requested:   1    Meds ordered this encounter  Medications  . benzonatate (TESSALON PERLES) 100 MG capsule    Sig: Take 1 capsule (100 mg total) by mouth 3 (three) times daily as needed for cough.    Dispense:  20 capsule    Refill:  0  . cefdinir (OMNICEF) 300 MG capsule    Sig: Take 1 capsule (300 mg total) by mouth 2 (two) times daily. 1 po BID    Dispense:  20 capsule    Refill:  0  . simvastatin (ZOCOR) 20 MG tablet    Sig: Take 1 tablet (20 mg total) by mouth at bedtime.    Dispense:  90 tablet    Refill:  [place], MD [facility] 11/10/2017, 2:40 PM

## 2017-11-11 LAB — LIPID PANEL
Chol/HDL Ratio: 3.9 ratio (ref 0.0–4.4)
Cholesterol, Total: 189 mg/dL (ref 100–199)
HDL: 49 mg/dL (ref >39)
LDL Calculated: 110 mg/dL — ABNORMAL HIGH (ref 0–99)
Triglycerides: 149 mg/dL (ref 0–149)
VLDL Cholesterol Cal: 30 mg/dL (ref 5–40)

## 2017-11-11 LAB — CBC WITH DIFFERENTIAL/PLATELET
Basophils Absolute: 0.1 10*3/uL (ref 0.0–0.2)
Basos: 1 %
EOS (ABSOLUTE): 0.1 10*3/uL (ref 0.0–0.4)
Eos: 1 %
Hematocrit: 40.6 % (ref 34.0–46.6)
Hemoglobin: 13.4 g/dL (ref 11.1–15.9)
Immature Grans (Abs): 0 10*3/uL (ref 0.0–0.1)
Immature Granulocytes: 0 %
Lymphocytes Absolute: 3.4 10*3/uL — ABNORMAL HIGH (ref 0.7–3.1)
Lymphs: 48 %
MCH: 32.1 pg (ref 26.6–33.0)
MCHC: 33 g/dL (ref 31.5–35.7)
MCV: 97 fL (ref 79–97)
Monocytes Absolute: 0.4 10*3/uL (ref 0.1–0.9)
Monocytes: 6 %
Neutrophils Absolute: 3.2 10*3/uL (ref 1.4–7.0)
Neutrophils: 44 %
Platelets: 364 10*3/uL (ref 150–379)
RBC: 4.17 x10E6/uL (ref 3.77–5.28)
RDW: 14.8 % (ref 12.3–15.4)
WBC: 7.2 10*3/uL (ref 3.4–10.8)

## 2017-11-11 LAB — CMP14+EGFR
ALT: 11 IU/L (ref 0–32)
AST: 12 IU/L (ref 0–40)
Albumin/Globulin Ratio: 1.6 (ref 1.2–2.2)
Albumin: 4.4 g/dL (ref 3.6–4.8)
Alkaline Phosphatase: 109 IU/L (ref 39–117)
BUN/Creatinine Ratio: 12 (ref 12–28)
BUN: 11 mg/dL (ref 8–27)
Bilirubin Total: 0.8 mg/dL (ref 0.0–1.2)
CO2: 23 mmol/L (ref 20–29)
Calcium: 9.3 mg/dL (ref 8.7–10.3)
Chloride: 104 mmol/L (ref 96–106)
Creatinine, Ser: 0.89 mg/dL (ref 0.57–1.00)
GFR calc Af Amer: 78 mL/min/{1.73_m2} (ref >59)
GFR calc non Af Amer: 68 mL/min/{1.73_m2} (ref >59)
Globulin, Total: 2.8 g/dL (ref 1.5–4.5)
Glucose: 89 mg/dL (ref 65–99)
Potassium: 4.6 mmol/L (ref 3.5–5.2)
Sodium: 142 mmol/L (ref 134–144)
Total Protein: 7.2 g/dL (ref 6.0–8.5)

## 2017-11-11 LAB — TSH: TSH: 1.72 u[IU]/mL (ref 0.450–4.500)

## 2017-11-15 ENCOUNTER — Encounter: Payer: 59 | Admitting: Family Medicine

## 2017-11-15 ENCOUNTER — Ambulatory Visit (INDEPENDENT_AMBULATORY_CARE_PROVIDER_SITE_OTHER): Payer: 59 | Admitting: *Deleted

## 2017-11-15 VITALS — BP 158/78 | HR 82

## 2017-11-15 DIAGNOSIS — Z0130 Encounter for examination of blood pressure without abnormal findings: Secondary | ICD-10-CM

## 2017-11-15 NOTE — Progress Notes (Signed)
Pt here for BP check

## 2017-11-22 ENCOUNTER — Ambulatory Visit: Payer: BLUE CROSS/BLUE SHIELD | Admitting: Pediatrics

## 2017-11-22 ENCOUNTER — Encounter: Payer: Self-pay | Admitting: Internal Medicine

## 2017-11-22 ENCOUNTER — Encounter: Payer: Self-pay | Admitting: Pediatrics

## 2017-11-22 VITALS — BP 135/87 | HR 84 | Temp 97.5°F | Ht 63.0 in | Wt 166.0 lb

## 2017-11-22 DIAGNOSIS — I10 Essential (primary) hypertension: Secondary | ICD-10-CM | POA: Diagnosis not present

## 2017-11-22 DIAGNOSIS — G44219 Episodic tension-type headache, not intractable: Secondary | ICD-10-CM

## 2017-11-22 MED ORDER — LOSARTAN POTASSIUM 50 MG PO TABS: 50.0000 mg | ORAL_TABLET | Freq: Every day | ORAL | 0 refills | Status: DC

## 2017-11-22 NOTE — Patient Instructions (Addendum)
Come back to be seen in 2 weeks, you will need blood work then for the new start of medicine  Write down your blood pressures from work, bring to next visit

## 2017-11-22 NOTE — Progress Notes (Signed)
  Subjective:   Patient ID: Elizabeth Wu, female    DOB: 07/31/1951, 67 y.o.   MRN: [id number] CC: Headache  HPI: Elizabeth Wu is a 67 y.o. female presenting for Headache  Has a headache the back of her head now Headache every day now, sometimes L forehead, sometimes sinuses, sometimes behind an eye Taking ibuprofen about once a day HA lasts 30-60 min Feels like an ache No associated nausea, light sensitivity  Thinks her eyes are more blurry, has to take off her glasses No h/o sleep apnea, doesn't know if she snores  No fevers Normal appetite  BP: Per chart review blood pressure has regularly been quite elevated over the last year Not currently taking any blood pressure medicines  Relevant past medical, surgical, family and social history reviewed. Allergies and medications reviewed and updated. Social History   Tobacco Use  Smoking Status Current Every Day Smoker  . Packs/day: 0.50  . Years: 40.00  . Pack years: 20.00  . Types: Cigarettes  Smokeless Tobacco Never Used   ROS: Per HPI   Objective:    BP 135/87   Pulse 84   Temp (!) 97.5 F (36.4 C) (Oral)   Ht 5\' 3"  (1.6 m)   Wt 166 lb (75.3 kg)   BMI 29.41 kg/m   Wt Readings from Last 3 Encounters:  11/22/17 166 lb (75.3 kg)  11/10/17 168 lb (76.2 kg)  07/31/17 165 lb (74.8 kg)    Gen: NAD, alert, cooperative with exam, NCAT EYES: EOMI, no conjunctival injection, or no icterus ENT:  TMs pearly gray b/l, OP without erythema LYMPH: no cervical LAD CV: NRRR, normal S1/S2, no murmur, distal pulses 2+ b/l Resp: CTABL, no wheezes, normal WOB Abd: +BS, soft, NTND. no guarding or organomegaly Ext: No edema, warm Neuro: Alert and oriented, strength equal b/l UE and LE, cranial nerves III-XII intact, sensation normal, coordination grossly normal MSK: normal muscle bulk  Assessment & Plan:  Kierstin was seen today for headache.  Diagnoses and all orders for this visit:  Essential hypertension Persistently  elevated, will start ACE inhibitor Patient to return to clinic in 2 weeks for recheck symptoms and blood work -     losartan (COZAAR) 50 MG tablet; Take 1 tablet (50 mg total) by mouth daily.  Headache Suspect due to elevated blood pressures at home, with at least blood pressure may be contributing Strongly recommended patient to get checked out by eye doctor to make sure no papilledema or other problems with her eyes that may be contributing Once blood pressure is controlled would consider starting medication such as amitriptyline as preventative for tension type headaches -     amitriptyline (ELAVIL) 25 MG tablet; Take 1 tablet (25 mg total) by mouth at bedtime.   Follow up plan: Return in about 2 weeks (around 12/06/2017). Assunta Found, MD [place]

## 2017-11-24 ENCOUNTER — Telehealth: Payer: Self-pay | Admitting: Family Medicine

## 2017-11-24 MED ORDER — AMITRIPTYLINE HCL 25 MG PO TABS: 25.0000 mg | ORAL_TABLET | Freq: Every day | ORAL | 0 refills | Status: DC

## 2017-11-24 NOTE — Telephone Encounter (Signed)
Can you find out what eye doctor and get records from them?

## 2017-11-24 NOTE — Telephone Encounter (Signed)
Patient  was seen by [facility] at [facility].  Records of visit have been requested.

## 2017-11-28 ENCOUNTER — Encounter (INDEPENDENT_AMBULATORY_CARE_PROVIDER_SITE_OTHER): Payer: Self-pay | Admitting: Ophthalmology

## 2017-11-28 ENCOUNTER — Ambulatory Visit (INDEPENDENT_AMBULATORY_CARE_PROVIDER_SITE_OTHER): Payer: BLUE CROSS/BLUE SHIELD | Admitting: Ophthalmology

## 2017-11-28 DIAGNOSIS — H35033 Hypertensive retinopathy, bilateral: Secondary | ICD-10-CM

## 2017-11-28 DIAGNOSIS — H3581 Retinal edema: Secondary | ICD-10-CM

## 2017-11-28 DIAGNOSIS — H25813 Combined forms of age-related cataract, bilateral: Secondary | ICD-10-CM | POA: Diagnosis not present

## 2017-11-28 DIAGNOSIS — H43393 Other vitreous opacities, bilateral: Secondary | ICD-10-CM

## 2017-11-28 NOTE — Telephone Encounter (Signed)
Called pt back discussed on 1/4.

## 2017-11-28 NOTE — Progress Notes (Signed)
[facility] Note  11/28/2017     CHIEF COMPLAINT Patient presents for Retina Evaluation   HISTORY OF PRESENT ILLNESS: Elizabeth Wu is a 67 y.o. female who presents to the clinic today for:   HPI    Retina Evaluation    In both eyes.  This started 3 months ago.  Associated Symptoms Pain, Floaters and Flashes.  Negative for Blind Spot, Glare, Shoulder/Hip pain, Distortion, Photophobia, Jaw Claudication, Fatigue, Trauma, Fever, Scalp Tenderness, Redness and Weight Loss.  Context:  distance vision, mid-range vision, near vision and reading.  Treatments tried include no treatments.  I, the attending physician,  performed the HPI with the patient and updated documentation appropriately.          Comments    Referral of DR. Y LE for evaluation of  Mac edema vs mac hole. Patient states for the last two or three months  she has"off and on dull headaches".  Her headaches are in different locations and they sometimes  become severe. Pt reports she has occasional floaters and flashes Ou .Marland Kitchen Denies eye gtts / vits        Last edited by Bernarda Caffey, MD on 11/28/2017  8:47 AM. (History)    Pt states that she is experiencing a "blurry spot" in OD VA; Pt states that she is able to "look around it"; Pt reports spot began 3 weeks ago, states since she the spot appeared she began to have headaches; Pt reports headaches became more frequent and have been gradually more severe; Pt reports when she began to notice the blurred spot she did experience flashes of light; Pt endorses that she began HBP medication last Thursday (01.03.19), reports BP was 164/80 "something";   Referring physician: Harlen Labs, MD [phone] [facility] [address] [place], [place] [address]  HISTORICAL INFORMATION:   Selected notes from the MEDICAL RECORD NUMBER Referred by Dr. Anthony Sar for concern of mac edema vs mac hole OD>OS;  LEE- 01.04.19 (Y. Le) [[facility] OD: 20/20 OS: 20/20] Ocular Hx-  PMH-      CURRENT  MEDICATIONS: No current outpatient medications on file. (Ophthalmic Drugs)   No current facility-administered medications for this visit.  (Ophthalmic Drugs)   Current Outpatient Medications (Other)  Medication Sig  . amitriptyline (ELAVIL) 25 MG tablet Take 1 tablet (25 mg total) by mouth at bedtime.  Marland Kitchen ibuprofen (ADVIL,MOTRIN) 200 MG tablet Take 200 mg by mouth every 6 (six) hours as needed.  Marland Kitchen losartan (COZAAR) 50 MG tablet Take 1 tablet (50 mg total) by mouth daily.  . Omega-3 Fatty Acids (FISH OIL) 1000 MG CAPS Take 1,000 mg by mouth.  . simvastatin (ZOCOR) 20 MG tablet Take 1 tablet (20 mg total) by mouth at bedtime.   No current facility-administered medications for this visit.  (Other)      REVIEW OF SYSTEMS: ROS    Positive for: Eyes   Negative for: Constitutional, Gastrointestinal, Neurological, Skin, Genitourinary, Musculoskeletal, HENT, Endocrine, Cardiovascular, Respiratory, Psychiatric, Allergic/Imm, Heme/Lymph   Last edited by Zenovia Jordan, LPN on 06/24/6658  9:35 AM. (History)       ALLERGIES No Known Allergies  PAST MEDICAL HISTORY Past Medical History:  Diagnosis Date  . Allergy   . DJD (degenerative joint disease)   . Hyperlipidemia   . Osteopenia    Past Surgical History:  Procedure Laterality Date  . APPENDECTOMY    . HERNIA REPAIR    . TONSILLECTOMY    . TONSILLECTOMY  FAMILY HISTORY Family History  Problem Relation Age of Onset  . Alzheimer's disease Mother   . Cancer Father        stomach cancer  . Diabetes Sister   . Heart disease Brother        MI    SOCIAL HISTORY Social History   Tobacco Use  . Smoking status: Current Every Day Smoker    Packs/day: 0.50    Years: 40.00    Pack years: 20.00    Types: Cigarettes  . Smokeless tobacco: Never Used  Substance Use Topics  . Alcohol use: No  . Drug use: No         OPHTHALMIC EXAM:  Base Eye Exam    Visual Acuity (Snellen - Linear)      Right Left   Dist cc 20/40  20/25 -1   Dist ph cc 20/20 20/25 +1   Correction:  Glasses       Tonometry (Tonopen, 9:00 AM)      Right Left   Pressure 14 14       Pupils      Dark Light Shape React APD   Right 3 2 Round Slow None   Left 3 2 Round Slow None       Visual Fields (Counting fingers)      Left Right    Full Full       Extraocular Movement      Right Left    Full, Ortho Full, Ortho       Neuro/Psych    Oriented x3:  Yes   Mood/Affect:  Normal       Dilation    Both eyes:  1.0% Mydriacyl, 2.5% Phenylephrine @ 9:00 AM        Slit Lamp and Fundus Exam    Slit Lamp Exam      Right Left   Lids/Lashes Dermatochalasis - upper lid Dermatochalasis - upper lid   Conjunctiva/Sclera White and quiet White and quiet   Cornea Arcus Arcus   Anterior Chamber Deep and quiet Deep and quiet   Iris Round and moderatly dilated Round and moderatly dilated   Lens 2+ Cortical cataract, 2+ Nuclear sclerosis 2+ Cortical cataract, 2+ Nuclear sclerosis   Vitreous Vitreous syneresis Vitreous syneresis       Fundus Exam      Right Left   Disc Normal Normal   C/D Ratio 0.2 0.25   Macula Flat, mild Retinal pigment epithelial mottling, No heme or edema Good foveal reflex, Flat, No heme or edema   Vessels Copper wiring, AV crossing changes Copper wiring, AV crossing changes   Periphery Attached Attached        Refraction    Wearing Rx      Sphere Cylinder Axis Add   Right +1.25 +1.25 177 +2.50   Left +0.75 +1.00 178 +2.50   Age:  53yr   Type:  PAL       Manifest Refraction (Over)      Sphere Cylinder Axis Dist VA   Right +1.25 +1.25 175 20/20   Left +0.75 +1.00 180 20/20          IMAGING AND PROCEDURES  Imaging and Procedures for 11/28/17  OCT, Retina - OU - Both Eyes     Right Eye Quality was good. Central Foveal Thickness: 234. Progression has no prior data. Findings include normal foveal contour, no IRF, no SRF.   Left Eye Quality was good. Central Foveal Thickness: 230. Progression  has no  prior data. Findings include normal foveal contour, no IRF, no SRF, vitreomacular adhesion .   Notes *Images captured and stored on drive  Diagnosis / Impression:  OU: NFP, No IRF/SRF -- No macular edema  Clinical management:  See below  Abbreviations: NFP - Normal foveal profile. CME - cystoid macular edema. PED - pigment epithelial detachment. IRF - intraretinal fluid. SRF - subretinal fluid. EZ - ellipsoid zone. ERM - epiretinal membrane. ORA - outer retinal atrophy. ORT - outer retinal tubulation. SRHM - subretinal hyper-reflective material                 ASSESSMENT/PLAN:    ICD-10-CM   1. 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 Hypertensive retinopathy of both eyes H35.033 OCT, Retina - OU - Both Eyes  3. Retinal edema H35.81 OCT, Retina - OU - Both Eyes  4. 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 Mild Vitreous syneresis OU  Discussed findings and prognosis  No RT or RD on 360 scleral depressed exam  Reviewed s/s of RT/RD  Strict return precautions for any such RT/RD signs/symptoms  2. Mild Hypertensive retinopathy OU - discussed importance of tight BP control - monitor  3. No retinal edema on exam or OCT  4. Combined form of age-related cataract OU-  - The symptoms of cataract, surgical options, and treatments and risks were discussed with patient. - discussed diagnosis and progression - not yet visually significant - monitor for now   Ophthalmic Meds Ordered this visit:  No orders of the defined types were placed in this encounter.      Return if symptoms worsen or fail to improve.  There are no Patient Instructions on file for this visit.   Explained the diagnoses, plan, and follow up with the patient and they expressed understanding.  Patient expressed understanding of the importance of proper follow up care.  This document serves as a record of services personally performed by Gardiner Sleeper, MD, PhD. It  was created on their behalf by Catha Brow, [place], a certified ophthalmic assistant. The creation of this record is the provider's dictation and/or activities during the visit.  Electronically signed by: Catha Brow, [place]  11/28/17 12:00 PM     Gardiner Sleeper, M.D., Ph.D. Diseases & Surgery of the Retina and [place] 11/28/17   I have reviewed the above documentation for accuracy and completeness, and I agree with the above. Gardiner Sleeper, M.D., Ph.D. 11/28/17 12:00 PM     Abbreviations: M myopia (nearsighted); A astigmatism; H hyperopia (farsighted); P presbyopia; Mrx spectacle prescription;  CTL contact lenses; OD right eye; OS left eye; OU both eyes  XT exotropia; ET esotropia; PEK punctate epithelial keratitis; PEE punctate epithelial erosions; DES dry eye syndrome; MGD meibomian gland dysfunction; ATs artificial tears; PFAT's preservative free artificial tears; [place] nuclear sclerotic cataract; PSC posterior subcapsular cataract; ERM epi-retinal membrane; PVD posterior vitreous detachment; RD retinal detachment; DM diabetes mellitus; DR diabetic retinopathy; NPDR non-proliferative diabetic retinopathy; PDR proliferative diabetic retinopathy; CSME clinically significant macular edema; DME diabetic macular edema; dbh dot blot hemorrhages; CWS cotton wool spot; POAG primary open angle glaucoma; C/D cup-to-disc ratio; HVF humphrey visual field; GVF goldmann visual field; OCT optical coherence tomography; IOP intraocular pressure; BRVO Branch retinal vein occlusion; CRVO central retinal vein occlusion; CRAO central retinal artery occlusion; BRAO branch retinal artery occlusion; RT retinal tear; SB scleral buckle; PPV pars plana vitrectomy; VH Vitreous  hemorrhage; PRP panretinal laser photocoagulation; IVK intravitreal kenalog; VMT vitreomacular traction; MH Macular hole;  NVD neovascularization of the disc; NVE neovascularization elsewhere; AREDS age related  eye disease study; ARMD age related macular degeneration; POAG primary open angle glaucoma; EBMD epithelial/anterior basement membrane dystrophy; ACIOL anterior chamber intraocular lens; IOL intraocular lens; PCIOL posterior chamber intraocular lens; Phaco/IOL phacoemulsification with intraocular lens placement; [place] photorefractive keratectomy; LASIK laser assisted in situ keratomileusis; HTN hypertension; DM diabetes mellitus; COPD chronic obstructive pulmonary disease

## 2017-12-05 ENCOUNTER — Encounter: Payer: Self-pay | Admitting: Pediatrics

## 2017-12-05 ENCOUNTER — Ambulatory Visit: Payer: BLUE CROSS/BLUE SHIELD | Admitting: Pediatrics

## 2017-12-05 VITALS — BP 129/74 | HR 77 | Temp 97.4°F | Ht 63.0 in | Wt 161.2 lb

## 2017-12-05 DIAGNOSIS — R519 Headache, unspecified: Secondary | ICD-10-CM

## 2017-12-05 DIAGNOSIS — I10 Essential (primary) hypertension: Secondary | ICD-10-CM

## 2017-12-05 DIAGNOSIS — Z1159 Encounter for screening for other viral diseases: Secondary | ICD-10-CM

## 2017-12-05 DIAGNOSIS — R51 Headache: Secondary | ICD-10-CM

## 2017-12-05 DIAGNOSIS — H538 Other visual disturbances: Secondary | ICD-10-CM | POA: Diagnosis not present

## 2017-12-05 MED ORDER — LOSARTAN POTASSIUM 50 MG PO TABS: 50.0000 mg | ORAL_TABLET | Freq: Every day | ORAL | 1 refills | Status: DC

## 2017-12-05 NOTE — Progress Notes (Signed)
  Subjective:   Patient ID: Elizabeth Wu, female    DOB: [birth year], 67 y.o.   MRN: [id number] CC: Follow-up (BP, Headaches)  HPI: Elizabeth Wu is a 67 y.o. female presenting for Follow-up (BP, Headaches)  Macular edema vs hole in macula? recently on eye exam at optometry. Referred to ophthalmology in [place], thought likely prominent vessels from elevated BP per pt  HA: better since last visit, has a small HA still most days, goes away with ibuprofen Stopped amitriptyline about 5 days ago started for tension HA ppx, hasnt noticed a difference without it  BP: 120s-130s/70s-80s at home Taking losartan daily  Vision: off and on blurry, mostly R eye Didn't get refractory exam at optometrist due to concern  Relevant past medical, surgical, family and social history reviewed. Allergies and medications reviewed and updated. Social History   Tobacco Use  Smoking Status Current Every Day Smoker  . Packs/day: 0.50  . Years: 40.00  . Pack years: 20.00  . Types: Cigarettes  Smokeless Tobacco Never Used   ROS: Per HPI   Objective:    BP 129/74   Pulse 77   Temp (!) 97.4 F (36.3 C) (Oral)   Ht '5\' 3"'$  (1.6 m)   Wt 161 lb 3.2 oz (73.1 kg)   BMI 28.56 kg/m   Wt Readings from Last 3 Encounters:  12/05/17 161 lb 3.2 oz (73.1 kg)  11/22/17 166 lb (75.3 kg)  11/10/17 168 lb (76.2 kg)    Gen: NAD, alert, cooperative with exam, NCAT EYES: EOMI, no conjunctival injection, or no icterus ENT:   OP without erythema CV: NRRR, normal S1/S2, no murmur, distal pulses 2+ b/l Resp: CTABL, no wheezes, normal WOB Ext: No edema, warm Neuro: Alert and oriented, strength equal b/l UE and LE, coordination grossly normal MSK: normal muscle bulk  Assessment & Plan:  Elizabeth Wu was seen today for follow-up.  Diagnoses and all orders for this visit:  Essential hypertension Improved Recheck K, Cr Cont current med, cont to check at home -     BMP8+EGFR -     losartan (COZAAR) 50 MG tablet; Take 1 tablet  (50 mg total) by mouth daily.  Need for hepatitis C screening test -     Hepatitis C antibody  Nonintractable episodic headache, unspecified headache type Improved with BP contorl OK to stop amitriptyline, do not think tension HA, rather from elevated BP   Blurry vision Get back for refractory exam in 2-3 weeks  Follow up plan: Return in about 3 months (around 03/05/2018). Assunta Found, MD [place]

## 2017-12-06 LAB — BMP8+EGFR
BUN/Creatinine Ratio: 14 (ref 12–28)
BUN: 11 mg/dL (ref 8–27)
CO2: 24 mmol/L (ref 20–29)
Calcium: 9.9 mg/dL (ref 8.7–10.3)
Chloride: 99 mmol/L (ref 96–106)
Creatinine, Ser: 0.81 mg/dL (ref 0.57–1.00)
GFR calc Af Amer: 88 mL/min/{1.73_m2} (ref >59)
GFR calc non Af Amer: 76 mL/min/{1.73_m2} (ref >59)
Glucose: 84 mg/dL (ref 65–99)
Potassium: 4.4 mmol/L (ref 3.5–5.2)
Sodium: 141 mmol/L (ref 134–144)

## 2017-12-06 LAB — HEPATITIS C ANTIBODY: Hep C Virus Ab: <0.1 s/co ratio (ref 0.0–0.9)

## 2017-12-07 ENCOUNTER — Ambulatory Visit: Payer: BLUE CROSS/BLUE SHIELD | Admitting: Family Medicine

## 2017-12-21 ENCOUNTER — Ambulatory Visit: Payer: Self-pay

## 2018-03-07 ENCOUNTER — Encounter: Payer: Self-pay | Admitting: Pediatrics

## 2018-03-07 ENCOUNTER — Ambulatory Visit: Payer: BLUE CROSS/BLUE SHIELD | Admitting: Pediatrics

## 2018-03-07 ENCOUNTER — Ambulatory Visit (INDEPENDENT_AMBULATORY_CARE_PROVIDER_SITE_OTHER): Payer: BLUE CROSS/BLUE SHIELD

## 2018-03-07 VITALS — BP 134/80 | HR 110 | Temp 98.2°F | Ht 62.0 in | Wt 164.6 lb

## 2018-03-07 DIAGNOSIS — E785 Hyperlipidemia, unspecified: Secondary | ICD-10-CM | POA: Diagnosis not present

## 2018-03-07 DIAGNOSIS — I10 Essential (primary) hypertension: Secondary | ICD-10-CM

## 2018-03-07 DIAGNOSIS — Z780 Asymptomatic menopausal state: Secondary | ICD-10-CM

## 2018-03-07 DIAGNOSIS — K5900 Constipation, unspecified: Secondary | ICD-10-CM

## 2018-03-07 DIAGNOSIS — J3089 Other allergic rhinitis: Secondary | ICD-10-CM | POA: Diagnosis not present

## 2018-03-07 MED ORDER — CETIRIZINE HCL 10 MG PO TABS: 10.0000 mg | ORAL_TABLET | Freq: Every day | ORAL | 11 refills | Status: DC

## 2018-03-07 MED ORDER — LINACLOTIDE 145 MCG PO CAPS: 145.0000 ug | ORAL_CAPSULE | Freq: Every day | ORAL | 1 refills | Status: DC

## 2018-03-07 MED ORDER — LOSARTAN POTASSIUM 50 MG PO TABS: 50.0000 mg | ORAL_TABLET | Freq: Every day | ORAL | 1 refills | Status: DC

## 2018-03-07 MED ORDER — SIMVASTATIN 20 MG PO TABS: 20.0000 mg | ORAL_TABLET | Freq: Every day | ORAL | 3 refills | Status: DC

## 2018-03-07 NOTE — Progress Notes (Signed)
  Subjective:   Patient ID: Elizabeth Wu, female    DOB: 01/30/1951, 67 y.o.   MRN: [id number] CC: Follow-up (3 month) Multiple medical problems. HPI: Elizabeth Wu is a 67 y.o. female presenting for Follow-up (3 month)  Headaches are less frequent. Thinks the ones she is still having are related to her sinuses and allergies.   Taking her blood pressure medicine regularly.  No chest pain, shortness of breath.  Constipation: Better when she takes the Linzess regularly.  Would like a refill.  Hyperlipidemia: Taking med regularly.  Relevant past medical, surgical, family and social history reviewed. Allergies and medications reviewed and updated. Social History   Tobacco Use  Smoking Status Current Every Day Smoker  . Packs/day: 0.50  . Years: 40.00  . Pack years: 20.00  . Types: Cigarettes  Smokeless Tobacco Never Used   ROS: Per HPI   Objective:    BP 134/80   Pulse (!) 110   Temp 98.2 F (36.8 C) (Oral)   Ht 5\' 2"  (1.575 m)   Wt 164 lb 9.6 oz (74.7 kg)   BMI 30.11 kg/m   Wt Readings from Last 3 Encounters:  03/07/18 164 lb 9.6 oz (74.7 kg)  12/05/17 161 lb 3.2 oz (73.1 kg)  11/22/17 166 lb (75.3 kg)    Gen: NAD, alert, cooperative with exam, NCAT EYES: EOMI, no conjunctival injection, or no icterus ENT:  TMs dull gray b/l, OP without erythema, no tenderness to palpation over frontal or maxillary sinuses. LYMPH: no cervical LAD CV: NRRR, normal S1/S2, no murmur, distal pulses 2+ b/l Resp: CTABL, no wheezes, normal WOB Abd: +BS, soft, NTND. no guarding or organomegaly Ext: No edema, warm Neuro: Alert and oriented, strength equal b/l UE and LE, coordination grossly normal MSK: normal muscle bulk  Assessment & Plan:  Tanaia was seen today for follow-up med problems.  Diagnoses and all orders for this visit:  Hyperlipidemia, unspecified hyperlipidemia type -     simvastatin (ZOCOR) 20 MG tablet; Take 1 tablet (20 mg total) by mouth at bedtime.  Essential  hypertension -     losartan (COZAAR) 50 MG tablet; Take 1 tablet (50 mg total) by mouth daily.  Constipation, unspecified constipation type -     linaclotide (LINZESS) 145 MCG CAPS capsule; Take 1 capsule (145 mcg total) by mouth daily before breakfast.  Allergic rhinitis due to other allergic trigger, unspecified seasonality Discussed sinus rinses, symptom care -     cetirizine (ZYRTEC) 10 MG tablet; Take 1 tablet (10 mg total) by mouth daily.  Post-menopausal -     DG [facility] DEXA   Follow up plan: Return in about 6 months (around 09/06/2018). Elizabeth Found, MD [place]

## 2018-03-07 NOTE — Patient Instructions (Signed)
Fever reducer and headache: tylenol and ibuprofen, can take together or alternating   Sinus pressure:  Nasal steroid such as flonase/fluticaone or nasocort daily Can also take daily antihistamine such as loratadine/claritin or cetirizine/zyrtec Decongestant such as phenylephrine, but take in the morning or may keep you awake at night  Sinus rinses/irritation: Netipot or similar with distilled water 2-3 times a day to clear out sinuses or Normal saline nasal spray  Sore throat:  Throat lozenges chloroseptic spray  Stick with bland foods Drink lots of fluids    BUFFERED ISOTONIC SALINE NASAL IRRIGATION  The Benefits:  1. When you irrigate, the isotonic saline (salt water) acts as a solvent and washes the mucus crusts and other debris from your nose.  2. This decongests and improves the airflow into your nose. The sinus passages begin to open.  3. Studies have also shown that a salt water and an alkaline (baking soda) irrigation solution improves nasal membrane cell function (mucociliary flow of mucus debris).  The Recipe:  1. Choose a 1-quart glass jar that is thoroughly cleansed.  2. Fill with sterile or distilled water, or you can boil water from the tap.  3. Add 1 to 2 heaping teaspoons of "pickling/canning/sea" salt (NOT table salt as it contains a large number of additives). This salt is available at the grocery store in the food canning section.  4. Add 1 teaspoon of [employer] Hammer Baking Soda (pure bicarbonate).  5. Mix ingredients together and store at room temperature. Discard after one week. If you find this solution too strong, you may decrease the amount of salt added to 1 to 1  teaspoons. With children it is often best to start with a milder solution and advance slowly. Irrigate with 240 ml (8 oz) twice daily.  The Instructions:  You should plan to irrigate your nose with buffered isotonic saline 2 times per day. Many people prefer to warm the solution slightly in  the microwave - but be sure that the solution is NOT HOT. Stand over the sink (some do this in the shower) and squirt the solution into each side of your nose, keeping your mouth open. This allows you to spit the saltwater out of your mouth. It will not harm you if you swallow a little.  If you have been told to use a nasal steroid such as Flonase, Nasonex, or Nasacort, you should always use isortonic saline solution first, then use your nasal steroid product. The nasal steroid is much more effective when sprayed onto clean nasal membranes and the steroid medicine will reach deeper into the nose.  Most people experience a little burning sensation the first few times they use a isotonic saline solution, but this usually goes away within a few days.     NASAL STEROID or FLONASE USE INSTRUCTIONS  Step 1. Prepare the nose. Blow the nose before administering the drug.  Step 2. Prime and activate the delivery device as recommended by the manufacturer.  Step 3. Position the head by tilting the head forward.  Step 4. Insert the tip of the applicator gently, avoiding contact with the septum.  Step 5. Aim the applicator tip about 45 from the floor of the nose and direct it at the outer corner of the eye on the same side to avoid traumatizing or spraying the septum.  Step 6. Close the other nostril gently with a finger.   Step 7. Sniff or inhale gently while delivering the drug.

## 2018-04-13 ENCOUNTER — Telehealth: Payer: Self-pay | Admitting: Pediatrics

## 2018-04-13 MED ORDER — AMOXICILLIN-POT CLAVULANATE 875-125 MG PO TABS: 1.0000 | ORAL_TABLET | Freq: Two times a day (BID) | ORAL | 0 refills | Status: DC

## 2018-04-13 NOTE — Telephone Encounter (Signed)
Pt aware and has no allergies.

## 2018-05-14 LAB — HM MAMMOGRAPHY

## 2018-06-25 NOTE — Progress Notes (Signed)
In Care Everywhere Cat 1 

## 2018-08-20 ENCOUNTER — Telehealth: Payer: Self-pay | Admitting: Pediatrics

## 2018-08-21 NOTE — Telephone Encounter (Signed)
Spoke with pt regarding eye Better today Pt will call for appt if sxs persist or worsen

## 2018-09-06 ENCOUNTER — Encounter: Payer: Self-pay | Admitting: Pediatrics

## 2018-09-06 ENCOUNTER — Ambulatory Visit (INDEPENDENT_AMBULATORY_CARE_PROVIDER_SITE_OTHER): Payer: BLUE CROSS/BLUE SHIELD | Admitting: Pediatrics

## 2018-09-06 VITALS — BP 123/79 | HR 69 | Temp 97.9°F | Ht 62.0 in | Wt 166.0 lb

## 2018-09-06 DIAGNOSIS — Z720 Tobacco use: Secondary | ICD-10-CM

## 2018-09-06 DIAGNOSIS — Z1211 Encounter for screening for malignant neoplasm of colon: Secondary | ICD-10-CM

## 2018-09-06 DIAGNOSIS — E785 Hyperlipidemia, unspecified: Secondary | ICD-10-CM

## 2018-09-06 DIAGNOSIS — J3089 Other allergic rhinitis: Secondary | ICD-10-CM | POA: Diagnosis not present

## 2018-09-06 DIAGNOSIS — I10 Essential (primary) hypertension: Secondary | ICD-10-CM

## 2018-09-06 LAB — BASIC METABOLIC PANEL
BUN/Creatinine Ratio: 13 (ref 12–28)
BUN: 11 mg/dL (ref 8–27)
CO2: 27 mmol/L (ref 20–29)
Calcium: 9.8 mg/dL (ref 8.7–10.3)
Chloride: 101 mmol/L (ref 96–106)
Creatinine, Ser: 0.84 mg/dL (ref 0.57–1.00)
GFR calc Af Amer: 83 mL/min/{1.73_m2} (ref >59)
GFR calc non Af Amer: 72 mL/min/{1.73_m2} (ref >59)
Glucose: 95 mg/dL (ref 65–99)
Potassium: 4.5 mmol/L (ref 3.5–5.2)
Sodium: 144 mmol/L (ref 134–144)

## 2018-09-06 MED ORDER — LOSARTAN POTASSIUM 50 MG PO TABS: 50.0000 mg | ORAL_TABLET | Freq: Every day | ORAL | 1 refills | Status: DC

## 2018-09-06 MED ORDER — BENZONATATE 200 MG PO CAPS: 200.0000 mg | ORAL_CAPSULE | Freq: Two times a day (BID) | ORAL | 1 refills | Status: DC | PRN

## 2018-09-06 NOTE — Patient Instructions (Signed)
Take zyrtec/cetirizine every day to help with allergy symptoms  Use flonase/fluticasone nose spray daily

## 2018-09-06 NOTE — Progress Notes (Signed)
  Subjective:   Patient ID: Elizabeth Wu, female    DOB: 01/04/1951, 67 y.o.   MRN: [id number] CC: Medical Management of Chronic Issues  HPI: Elizabeth Wu is a 67 y.o. female   HTN: taking med regularly, no SOB, CP or lightheadedness  HLD: tolerating statin, no s/e  Tobacco use: open to the idea of cutting back, has been hard to quit  Has had some URI symptoms, improving past few days. No fevers. Appetite normal.   Relevant past medical, surgical, family and social history reviewed. Allergies and medications reviewed and updated. Social History   Tobacco Use  Smoking Status Current Every Day Smoker  . Packs/day: 0.50  . Years: 40.00  . Pack years: 20.00  . Types: Cigarettes  Smokeless Tobacco Never Used   ROS: Per HPI   Objective:    BP 123/79   Pulse 69   Temp 97.9 F (36.6 C) (Oral)   Ht 5\' 2"  (1.575 m)   Wt 166 lb (75.3 kg)   BMI 30.36 kg/m   Wt Readings from Last 3 Encounters:  09/06/18 166 lb (75.3 kg)  03/07/18 164 lb 9.6 oz (74.7 kg)  12/05/17 161 lb 3.2 oz (73.1 kg)    Gen: NAD, alert, cooperative with exam, NCAT EYES: EOMI, no conjunctival injection, or no icterus ENT:  TMs pearly gray b/l, OP without erythema LYMPH: no cervical LAD CV: NRRR, normal S1/S2, no murmur, distal pulses 2+ b/l Resp: CTABL, no wheezes, normal WOB Abd: +BS, soft, NTND. no guarding or organomegaly Ext: No edema, warm Neuro: Alert and oriented, strength equal b/l UE and LE, coordination grossly normal MSK: normal muscle bulk  Assessment & Plan:  Elizabeth Wu was seen today for medical management of chronic issues.  Diagnoses and all orders for this visit:  Essential hypertension Well controlle,d cont below -     losartan (COZAAR) 50 MG tablet; Take 1 tablet (50 mg total) by mouth daily. -     Basic Metabolic Panel  Colon cancer screening -     Fecal occult blood, imunochemical; Future  Allergic rhinitis due to other allergic trigger, unspecified seasonality Antihistamine,  nasal steroid as tolerated. Sent in [place] which has helped cough in the past.  Hyperlipidemia, unspecified hyperlipidemia type Stable, cont statin  Tobacco use Discussed cessation strategies, pt pre-cintemplative  Follow up plan: Return in about 6 months (around 03/08/2019). Assunta Found, MD [place]

## 2018-09-08 ENCOUNTER — Encounter: Payer: Self-pay | Admitting: Pediatrics

## 2018-09-18 ENCOUNTER — Ambulatory Visit (INDEPENDENT_AMBULATORY_CARE_PROVIDER_SITE_OTHER): Payer: BLUE CROSS/BLUE SHIELD

## 2018-09-18 DIAGNOSIS — Z23 Encounter for immunization: Secondary | ICD-10-CM | POA: Diagnosis not present

## 2018-09-19 ENCOUNTER — Ambulatory Visit: Payer: BLUE CROSS/BLUE SHIELD

## 2018-10-15 ENCOUNTER — Other Ambulatory Visit: Payer: Self-pay | Admitting: Pediatrics

## 2018-10-15 DIAGNOSIS — Z0000 Encounter for general adult medical examination without abnormal findings: Secondary | ICD-10-CM

## 2018-11-28 ENCOUNTER — Encounter: Payer: Self-pay | Admitting: Pediatrics

## 2018-11-28 ENCOUNTER — Ambulatory Visit (INDEPENDENT_AMBULATORY_CARE_PROVIDER_SITE_OTHER): Payer: BLUE CROSS/BLUE SHIELD | Admitting: Pediatrics

## 2018-11-28 VITALS — BP 143/68 | HR 70 | Temp 97.0°F | Ht 62.0 in | Wt 170.0 lb

## 2018-11-28 DIAGNOSIS — Z0000 Encounter for general adult medical examination without abnormal findings: Secondary | ICD-10-CM

## 2018-11-28 DIAGNOSIS — Z23 Encounter for immunization: Secondary | ICD-10-CM | POA: Diagnosis not present

## 2018-11-28 LAB — URINALYSIS, COMPLETE
Bilirubin, UA: NEGATIVE
Glucose, UA: NEGATIVE
Ketones, UA: NEGATIVE
Leukocytes, UA: NEGATIVE
Nitrite, UA: NEGATIVE
Protein, UA: NEGATIVE
RBC, UA: NEGATIVE
Specific Gravity, UA: 1.01 (ref 1.005–1.030)
Urobilinogen, Ur: 0.2 mg/dL (ref 0.2–1.0)
pH, UA: 7 (ref 5.0–7.5)

## 2018-11-28 LAB — MICROSCOPIC EXAMINATION
RBC, UA: NONE SEEN /hpf (ref 0–2)
Renal Epithel, UA: NONE SEEN /hpf

## 2018-11-28 NOTE — Patient Instructions (Addendum)
Use over the counter hydrocortisone once daily on elbow followed by thick white moisturizer such as cerave, cetaphil or eucerin. Aquaphor or vasoline can also be used.  Debrox drops for ear wax Right ear if needed

## 2018-11-28 NOTE — Progress Notes (Signed)
  Subjective:   Patient ID: Elizabeth Wu, female    DOB: October 25, 1951, 68 y.o.   MRN: [id number] CC: Annual Exam  HPI: Elizabeth Wu is a 68 y.o. female   Overall has been feeling well.  Trying to stay active.  Trying to cut back on smoking.  Taking blood pressure medicine regularly.  Blood pressure usually not this high  Taking cholesterol medicine regularly.  Relevant past medical, surgical, family and social history reviewed. Allergies and medications reviewed and updated. Social History   Tobacco Use  Smoking Status Current Every Day Smoker  . Packs/day: 0.50  . Years: 40.00  . Pack years: 20.00  . Types: Cigarettes  Smokeless Tobacco Never Used   ROS: All systems negative other than what is in the HPI  Objective:    BP (!) 143/68   Pulse 70   Temp (!) 97 F (36.1 C) (Oral)   Ht _0  (1.575 m)   Wt 170 lb (77.1 kg)   BMI 31.09 kg/m   Wt Readings from Last 3 Encounters:  11/28/18 170 lb (77.1 kg)  09/06/18 166 lb (75.3 kg)  03/07/18 164 lb 9.6 oz (74.7 kg)    Gen: NAD, alert, cooperative with exam, NCAT EYES: EOMI, no conjunctival injection, or no icterus ENT: Right TM obscured by cerumen, normal left TM, OP without erythema LYMPH: no cervical LAD CV: NRRR, normal S1/S2, no murmur, distal pulses 2+ b/l Resp: CTABL, no wheezes, normal WOB Abd: +BS, soft, NTND. no guarding or organomegaly Ext: No edema, warm Neuro: Alert and oriented, strength equal b/l UE and LE, coordination grossly normal MSK: normal muscle bulk Skin: Approximately 3 cm patch of slightly flaky white skin, no erythema right elbow.  Assessment & Plan:  Elizabeth Wu was seen today for annual exam.  Diagnoses and all orders for this visit:  Encounter for preventive care -     Cologuard -     Urinalysis, Complete -     BMP8+EGFR -     Lipid panel  Other orders -     Microscopic Examination -     Pneumococcal polysaccharide vaccine 23-valent greater than or equal to 2yo  subcutaneous/IM   Follow up plan: 6 months, sooner if needed Assunta Found, MD [place]

## 2018-11-29 LAB — BMP8+EGFR
BUN/Creatinine Ratio: 11 — ABNORMAL LOW (ref 12–28)
BUN: 9 mg/dL (ref 8–27)
CO2: 25 mmol/L (ref 20–29)
Calcium: 9 mg/dL (ref 8.7–10.3)
Chloride: 102 mmol/L (ref 96–106)
Creatinine, Ser: 0.84 mg/dL (ref 0.57–1.00)
GFR calc Af Amer: 83 mL/min/{1.73_m2} (ref >59)
GFR calc non Af Amer: 72 mL/min/{1.73_m2} (ref >59)
Glucose: 87 mg/dL (ref 65–99)
Potassium: 4.3 mmol/L (ref 3.5–5.2)
Sodium: 141 mmol/L (ref 134–144)

## 2018-11-29 LAB — LIPID PANEL
Chol/HDL Ratio: 4.6 ratio — ABNORMAL HIGH (ref 0.0–4.4)
Cholesterol, Total: 196 mg/dL (ref 100–199)
HDL: 43 mg/dL (ref >39)
LDL Calculated: 126 mg/dL — ABNORMAL HIGH (ref 0–99)
Triglycerides: 136 mg/dL (ref 0–149)
VLDL Cholesterol Cal: 27 mg/dL (ref 5–40)

## 2018-12-19 ENCOUNTER — Encounter: Payer: Self-pay | Admitting: Family Medicine

## 2018-12-19 ENCOUNTER — Ambulatory Visit: Payer: BLUE CROSS/BLUE SHIELD | Admitting: Family Medicine

## 2018-12-19 VITALS — BP 138/71 | HR 81 | Temp 96.9°F | Ht 66.0 in | Wt 173.0 lb

## 2018-12-19 DIAGNOSIS — H1013 Acute atopic conjunctivitis, bilateral: Secondary | ICD-10-CM

## 2018-12-19 MED ORDER — OLOPATADINE HCL 0.2 % OP SOLN: OPHTHALMIC | 0 refills | Status: DC

## 2018-12-19 NOTE — Patient Instructions (Signed)
Your eyes do not look infected.  Sometimes viruses can look like this but I have a higher suspicion that this is allergic which is probably why the antibiotic eyedrop is not working.  I am changing your eyedrop to generic Pataday.  You use 1 drop in each eye 1 time per day.  If you notice no improvement by Friday, I want you to go see Dr. Marin Comment at [facility] in [place] for an eye exam.   Allergic Conjunctivitis A clear membrane (conjunctiva) covers the white part of your eye and the inner surface of your eyelid. Allergic conjunctivitis happens when this membrane has inflammation. This is caused by allergies. Common causes of allergic reactions (allergens)include:  Outdoor allergens, such as: ? Pollen. ? Grass and weeds. ? Mold spores.  Indoor allergens, such as: ? Dust. ? Smoke. ? Mold. ? Pet dander. ? Animal hair. This condition can make your eye red or pink. It can also make your eye feel itchy. This condition cannot be spread from one person to another person (is not contagious). Follow these instructions at home:  Try not to be around things that you are allergic to.  Take or apply over-the-counter and prescription medicines only as told by your doctor. These include any eye drops.  Place a cool, clean washcloth on your eye for 10-20 minutes. Do this 3-4 times a day.  Do not touch or rub your eyes.  Do not wear contact lenses until the inflammation is gone. Wear glasses instead.  Do not wear eye makeup until the inflammation is gone.  Keep all follow-up visits as told by your doctor. This is important. Contact a doctor if:  Your symptoms get worse.  Your symptoms do not get better with treatment.  You have mild eye pain.  You are sensitive to light,  You have spots or blisters on your eyes.  You have pus coming from your eye.  You have a fever. Get help right away if:  You have redness, swelling, or other symptoms in only one eye.  Your vision is blurry.  You have  vision changes.  You have very bad eye pain. Summary  Allergic conjunctivitis is caused by allergies. It can make your eye red or pink, and it can make your eye feel itchy.  This condition cannot be spread from one person to another person (is not contagious).  Try not to be around things that you are allergic to.  Take or apply over-the-counter and prescription medicines only as told by your doctor. These include any eye drops.  Contact your doctor if your symptoms get worse or they do not get better with treatment. This information is not intended to replace advice given to you by your health care provider. Make sure you discuss any questions you have with your health care provider. Document Released: 04/27/2010 Document Revised: 07/01/2016 Document Reviewed: 07/01/2016 Elsevier Interactive Patient Education  [employer].

## 2018-12-19 NOTE — Progress Notes (Signed)
Subjective: CC: itchy, watery eyes PCP: Eustaquio Maize, MD Elizabeth Wu is a 68 y.o. female presenting to clinic today for:  1. Itchy/watery eyes Patient reports onset of itchy, watery eyes last Thursday.  She reports that it started on the right side but now has progressed to the left side.  On Sunday, she was evaluated for the right sided itchy watery eye and thought to have bacterial conjunctivitis.  She was started on Polytrim eyedrops but she notes that this has not improved her symptoms at all and in fact it has spread to the left side now.  She denies any purulent discharge from the eyes.  No matting.  She reports the discharge typically seems clear.  She sometimes gets yellow crusty's at the corners of her eyes.  Denies any visual disturbance, pain with eye movement.  She does report some associated itching of the eyelids and skin surrounding the eyes.  She is not on any oral antihistamines.  She denies any chemical exposures to the eyes but does state that she works in a [employer], prison and probation services.  She is remained out of work all week because she was afraid that this was contagious.  No fevers, postnasal drip, rhinorrhea or cough.   ROS: Per HPI  No Known Allergies Past Medical History:  Diagnosis Date  . Allergy   . DJD (degenerative joint disease)   . Hyperlipidemia   . Osteopenia     Current Outpatient Medications:  .  ibuprofen (ADVIL,MOTRIN) 200 MG tablet, Take 200 mg by mouth every 6 (six) hours as needed., Disp: , Rfl:  .  losartan (COZAAR) 50 MG tablet, Take 1 tablet (50 mg total) by mouth daily., Disp: 90 tablet, Rfl: 1 .  Omega-3 Fatty Acids (FISH OIL) 1000 MG CAPS, Take 1,000 mg by mouth., Disp: , Rfl:  .  simvastatin (ZOCOR) 20 MG tablet, Take 1 tablet (20 mg total) by mouth at bedtime., Disp: 90 tablet, Rfl: 3 Social History   Socioeconomic History  . Marital status: Single    Spouse name: Not on file  . Number of children: Not on file  . Years of education: Not  on file  . Highest education level: Not on file  Occupational History  . Occupation: Sales executive  Social Needs  . Financial resource strain: Not on file  . Food insecurity:    Worry: Not on file    Inability: Not on file  . Transportation needs:    Medical: Not on file    Non-medical: Not on file  Tobacco Use  . Smoking status: Current Every Day Smoker    Packs/day: 0.50    Years: 40.00    Pack years: 20.00    Types: Cigarettes  . Smokeless tobacco: Never Used  Substance and Sexual Activity  . Alcohol use: No  . Drug use: No  . Sexual activity: Not on file  Lifestyle  . Physical activity:    Days per week: Not on file    Minutes per session: Not on file  . Stress: Not on file  Relationships  . Social connections:    Talks on phone: Not on file    Gets together: Not on file    Attends religious service: Not on file    Active member of club or organization: Not on file    Attends meetings of clubs or organizations: Not on file    Relationship status: Not on file  . Intimate partner violence:    Fear of  current or ex partner: Not on file    Emotionally abused: Not on file    Physically abused: Not on file    Forced sexual activity: Not on file  Other Topics Concern  . Not on file  Social History Narrative  . Not on file   Family History  Problem Relation Age of Onset  . Alzheimer's disease Mother   . Cancer Father        stomach cancer  . Diabetes Sister   . Heart disease Brother        MI    Objective: Office vital signs reviewed. BP 138/71   Pulse 81   Temp (!) 96.9 F (36.1 C) (Oral)   Ht 5\' 6"  (1.676 m)   Wt 173 lb (78.5 kg)   BMI 27.92 kg/m   Physical Examination:  General: Awake, alert, well nourished, No acute distress HEENT: Blanching erythema and inflammation noted along the cheeks and eyelids.  She has bilateral conjunctivitis noted.  No purulent discharge.  Clear tear formation noted.  No pain with extraocular movement testing.  No  tenderness to palpation to the supra or infra orbital spaces.  PERRLA.  EOMI. Assessment/ Plan: 68 y.o. female   1. Acute atopic conjunctivitis of both eyes Her exam today did not suggest bacterial infection.  I reviewed the urgent care note.  Today's Differential diagnosis includes allergic conjunctivitis versus viral conjunctivitis.  I have recommended that she totally discontinue use of Polytrim eyedrops.  Start Pataday.  1 drop in each eye once per day.  If no significant improvement by Friday, I have recommended that she see optometry.  If she has any worsening symptoms or worrisome symptoms, I have directed her to the emergency department.  Home care instructions were reviewed.  Follow-up PRN.   No orders of the defined types were placed in this encounter.  Meds ordered this encounter  Medications  . Olopatadine HCl 0.2 % SOLN    Sig: Place 1 drop in each eye ONE time per day    Dispense:  2.5 mL    Refill:  0     Elimelech Houseman Windell Moulding, DO [place] [phone]

## 2019-01-31 ENCOUNTER — Ambulatory Visit: Payer: BLUE CROSS/BLUE SHIELD | Admitting: Nurse Practitioner

## 2019-01-31 ENCOUNTER — Other Ambulatory Visit: Payer: Self-pay

## 2019-01-31 ENCOUNTER — Encounter: Payer: Self-pay | Admitting: Nurse Practitioner

## 2019-01-31 VITALS — BP 150/77 | HR 83 | Temp 96.8°F | Ht 66.0 in | Wt 170.0 lb

## 2019-01-31 DIAGNOSIS — K529 Noninfective gastroenteritis and colitis, unspecified: Secondary | ICD-10-CM | POA: Diagnosis not present

## 2019-01-31 MED ORDER — ONDANSETRON HCL 4 MG PO TABS: 4.0000 mg | ORAL_TABLET | Freq: Three times a day (TID) | ORAL | 0 refills | Status: DC | PRN

## 2019-01-31 NOTE — Progress Notes (Signed)
   Subjective:    Patient ID: Elizabeth Wu, female    DOB: 09/30/1951, 68 y.o.   MRN: [id number]   Chief Complaint: Abdominal Pain (diarrhea   Reorder cologard)   HPI Patient come sin today C/O diarrhea that started last night.has some abdominal pain that is worse when she has to go to restroom. She was nauseated this morning. Was  Nor able to go to work today. Last diarrhea episode was at 3pm yesterday.   Review of Systems  Constitutional: Positive for appetite change (decreased). Negative for chills and fever.  Respiratory: Negative.   Cardiovascular: Negative.   Gastrointestinal: Positive for abdominal pain, diarrhea and nausea.  Genitourinary: Negative.   Neurological: Negative.   Psychiatric/Behavioral: Negative.   All other systems reviewed and are negative.      Objective:   Physical Exam Vitals signs and nursing note reviewed.  Constitutional:      General: She is not in acute distress.    Appearance: She is well-developed. She is obese.  Cardiovascular:     Rate and Rhythm: Normal rate and regular rhythm.     Heart sounds: Normal heart sounds.  Pulmonary:     Breath sounds: Normal breath sounds.  Abdominal:     General: Abdomen is flat. Bowel sounds are normal. There is no distension or abdominal bruit.     Palpations: Abdomen is soft. There is no mass.     Tenderness: There is no rebound. Negative signs include Murphy's sign and McBurney's sign.  Skin:    General: Skin is warm and dry.  Neurological:     General: No focal deficit present.     Mental Status: She is alert.  Psychiatric:        Mood and Affect: Mood normal.        Behavior: Behavior normal.   BP (!) 150/77   Pulse 83   Temp (!) 96.8 F (36 C) (Oral)   Ht 5\' 6"  (1.676 m)   Wt 170 lb (77.1 kg)   BMI 27.44 kg/m          Assessment & Plan:  Elizabeth Wu in today with chief complaint of Abdominal Pain (diarrhea   Reorder cologard)   1. Gastroenteritis First 24 Hours-Clear liquids  popsicles  Jello  gatorade  Sprite Second 24 hours-Add Full liquids ( Liquids you cant see through) Third 24 hours- Bland diet ( foods that are baked or broiled)  *avoiding fried foods and highly spiced foods* During these 3 days  Avoid milk, cheese, ice cream or any other dairy products  Avoid caffeine- REMEMBER Mt. Dew and Mello Yellow contain lots of caffeine You should eat and drink in  Frequent small volumes If no improvement in symptoms or worsen in 2-3 days should RETRUN TO OFFICE or go to ER!     - ondansetron (ZOFRAN) 4 MG tablet; Take 1 tablet (4 mg total) by mouth every 8 (eight) hours as needed for nausea or vomiting.  Dispense: 20 tablet; Refill: 0  Mary-Margaret Hassell Done, FNP

## 2019-01-31 NOTE — Patient Instructions (Signed)

## 2019-02-18 ENCOUNTER — Encounter: Payer: Self-pay | Admitting: Family Medicine

## 2019-03-07 ENCOUNTER — Ambulatory Visit: Payer: BLUE CROSS/BLUE SHIELD | Admitting: Family Medicine

## 2019-05-29 ENCOUNTER — Ambulatory Visit: Payer: BLUE CROSS/BLUE SHIELD | Admitting: Family Medicine

## 2019-05-29 ENCOUNTER — Ambulatory Visit (INDEPENDENT_AMBULATORY_CARE_PROVIDER_SITE_OTHER): Payer: Medicare Other | Admitting: Family Medicine

## 2019-05-29 ENCOUNTER — Encounter: Payer: Self-pay | Admitting: Family Medicine

## 2019-05-29 VITALS — BP 122/81

## 2019-05-29 DIAGNOSIS — I10 Essential (primary) hypertension: Secondary | ICD-10-CM

## 2019-05-29 DIAGNOSIS — K219 Gastro-esophageal reflux disease without esophagitis: Secondary | ICD-10-CM | POA: Diagnosis not present

## 2019-05-29 DIAGNOSIS — Z720 Tobacco use: Secondary | ICD-10-CM | POA: Diagnosis not present

## 2019-05-29 DIAGNOSIS — E782 Mixed hyperlipidemia: Secondary | ICD-10-CM

## 2019-05-29 MED ORDER — SIMVASTATIN 20 MG PO TABS: 20.0000 mg | ORAL_TABLET | Freq: Every day | ORAL | 3 refills | Status: DC

## 2019-05-29 MED ORDER — PANTOPRAZOLE SODIUM 20 MG PO TBEC: 20.0000 mg | DELAYED_RELEASE_TABLET | Freq: Every day | ORAL | 1 refills | Status: DC

## 2019-05-29 MED ORDER — LOSARTAN POTASSIUM 50 MG PO TABS: 50.0000 mg | ORAL_TABLET | Freq: Every day | ORAL | 3 refills | Status: DC

## 2019-05-29 NOTE — Progress Notes (Signed)
Telephone visit  Subjective: CC: HTN, HLD PCP: [name], DO Elizabeth Wu is a 68 y.o. female calls for telephone consult today. Patient provides verbal consent for consult held via phone.  Location of patient: home Location of provider: Working remotely from home Others present for call: none  1. Hypertension/ hyperlipidemia Patient reports compliance with Zocor 20 mg daily and losartan 50 mg daily.  No chest pain, shortness of breath, lower extremity edema, dizziness or loss of consciousness.  She continues to smoke half a pack per day and has done so for greater than 40 years.  2.  Nausea Patient reports intermittent nausea.  She does report reflux symptoms that are relieved by Tums.  She uses Tums several days per week.  Denies any hematochezia, melena.  She is tolerating p.o. intake without difficulty.  She has history of gallstones noted on imaging study a couple of years ago.  Denies any right upper quadrant pain.  Additionally, she still has not gotten the Cologuard in the mail for colon cancer screening.  No known family history of colon cancers.    ROS: Per HPI  No Known Allergies Past Medical History:  Diagnosis Date  . Allergy   . DJD (degenerative joint disease)   . Hyperlipidemia   . Osteopenia     Current Outpatient Medications:  .  ibuprofen (ADVIL,MOTRIN) 200 MG tablet, Take 200 mg by mouth every 6 (six) hours as needed., Disp: , Rfl:  .  losartan (COZAAR) 50 MG tablet, Take 1 tablet (50 mg total) by mouth daily., Disp: 90 tablet, Rfl: 1 .  Omega-3 Fatty Acids (FISH OIL) 1000 MG CAPS, Take 1,000 mg by mouth., Disp: , Rfl:  .  ondansetron (ZOFRAN) 4 MG tablet, Take 1 tablet (4 mg total) by mouth every 8 (eight) hours as needed for nausea or vomiting., Disp: 20 tablet, Rfl: 0 .  simvastatin (ZOCOR) 20 MG tablet, Take 1 tablet (20 mg total) by mouth at bedtime., Disp: 90 tablet, Rfl: 3  Blood pressure 122/81. GEN: does not sound in distress Pulm: no  dyspnea with speech  Assessment/ Plan: 68 y.o. female   1. Essential hypertension Appears to be under excellent control with losartan 50 mg daily.  Refills have been sent.  May consider interval BMP at next visit which has been scheduled for September 9.  Patient aware of date and time - losartan (COZAAR) 50 MG tablet; Take 1 tablet (50 mg total) by mouth daily.  Dispense: 90 tablet; Refill: 3  2. Mixed hyperlipidemia Fasting lipid panel not due till January.  Continue 20 mg of Zocor.  Of note has had persistent elevation in cholesterol with Lipitor and Crestor which is why she was switched to Zocor. - simvastatin (ZOCOR) 20 MG tablet; Take 1 tablet (20 mg total) by mouth at bedtime.  Dispense: 90 tablet; Refill: 3  3. Gastroesophageal reflux disease without esophagitis Trial of PPI.  She is had intermittent nausea.  This is likely reflective of uncontrolled GERD.  Differential diagnosis considered includes gallbladder etiology given history of gallstones on previous imaging studies.  No right upper quadrant pain, which makes this less likely - pantoprazole (PROTONIX) 20 MG tablet; Take 1 tablet (20 mg total) by mouth daily.  Dispense: 90 tablet; Refill: 1  4. Tobacco use Contemplative.  We will continue to counsel with each visit.   Start time: 8:25am End time: 8:43am  Total time spent on patient care (including telephone call/ virtual visit): 25 minutes  [place],  DO [place] [phone]

## 2019-06-01 ENCOUNTER — Other Ambulatory Visit: Payer: Self-pay | Admitting: Family Medicine

## 2019-06-01 DIAGNOSIS — Z1211 Encounter for screening for malignant neoplasm of colon: Secondary | ICD-10-CM

## 2019-07-18 LAB — COLOGUARD: Cologuard: NEGATIVE

## 2019-07-31 ENCOUNTER — Ambulatory Visit: Payer: Self-pay | Admitting: Family Medicine

## 2019-08-07 ENCOUNTER — Ambulatory Visit: Payer: Medicare Other | Admitting: Family Medicine

## 2019-08-19 ENCOUNTER — Other Ambulatory Visit: Payer: Self-pay

## 2019-08-21 ENCOUNTER — Ambulatory Visit (INDEPENDENT_AMBULATORY_CARE_PROVIDER_SITE_OTHER): Payer: Medicare Other

## 2019-08-21 ENCOUNTER — Other Ambulatory Visit: Payer: Self-pay

## 2019-08-21 DIAGNOSIS — Z23 Encounter for immunization: Secondary | ICD-10-CM

## 2019-10-04 ENCOUNTER — Other Ambulatory Visit: Payer: Self-pay

## 2019-10-07 ENCOUNTER — Other Ambulatory Visit: Payer: Self-pay

## 2019-10-07 ENCOUNTER — Ambulatory Visit (INDEPENDENT_AMBULATORY_CARE_PROVIDER_SITE_OTHER): Payer: Medicare Other | Admitting: Family Medicine

## 2019-10-07 ENCOUNTER — Encounter: Payer: Self-pay | Admitting: Family Medicine

## 2019-10-07 VITALS — BP 138/75 | HR 66 | Temp 97.3°F | Ht 66.0 in | Wt 171.0 lb

## 2019-10-07 DIAGNOSIS — E7800 Pure hypercholesterolemia, unspecified: Secondary | ICD-10-CM

## 2019-10-07 DIAGNOSIS — I10 Essential (primary) hypertension: Secondary | ICD-10-CM

## 2019-10-07 DIAGNOSIS — Z720 Tobacco use: Secondary | ICD-10-CM

## 2019-10-07 DIAGNOSIS — Z0000 Encounter for general adult medical examination without abnormal findings: Secondary | ICD-10-CM | POA: Diagnosis not present

## 2019-10-07 DIAGNOSIS — Z130 Encounter for screening for diseases of the blood and blood-forming organs and certain disorders involving the immune mechanism: Secondary | ICD-10-CM

## 2019-10-07 NOTE — Patient Instructions (Signed)
Cologuard was NEGATIVE.  Thank you for coming in today for your Annual Medicare Wellness Visit.  Things that we discussed today are included in this packet.   Create and/or bring a copy of your Living Will/ Advanced Directive into the office so that we may respect your wishes should an emergency occur.   Get the recommended life-saving vaccines we discussed today.   Get your mammogram/ colonoscopy/ DEXA scans as directed by your provider.   Make sure that your medications are organized and safely stored.  Remember to always ask for help if you forget when/ how to take your medications.   Make healthy food choices (Rich in fruits/ veggies/ lean meats and low in salt, sugar and fat)   Do something that you enjoy for at least 30 minutes every day to stay active (walking, gardening, swimming, etc). This will help you lower your risk of falls/ broken bones.   Be social, do puzzles/ crosswords.  These things help the mind stay young and lower your risk of developing dementia.   Make sure that your home is safe by checking your smoke detectors regularly and doing the things outlined below to lower your risk of falls.  Fall Prevention in the Home Falls can cause injuries and can affect people from all age groups. There are many simple things that you can do to make your home safe and to help prevent falls. What can I do on the outside of my home?  Regularly repair the edges of walkways and driveways and fix any cracks.  Remove high doorway thresholds.  Trim any shrubbery on the main path into your home.  Use bright outdoor lighting.  Clear walkways of debris and clutter, including tools and rocks.  Regularly check that handrails are securely fastened and in good repair. Both sides of any steps should have handrails.  Install guardrails along the edges of any raised decks or porches.  Have leaves, snow, and ice cleared regularly.  Use sand or salt on walkways during winter  months.  In the garage, clean up any spills right away, including grease or oil spills. What can I do in the bathroom?  Use night lights.  Install grab bars by the toilet and in the tub and shower. Do not use towel bars as grab bars.  Use non-skid mats or decals on the floor of the tub or shower.  If you need to sit down while you are in the shower, use a plastic, non-slip stool.  Keep the floor dry. Immediately clean up any water that spills on the floor.  Remove soap buildup in the tub or shower on a regular basis.  Attach bath mats securely with double-sided non-slip rug tape.  Remove throw rugs and other tripping hazards from the floor. What can I do in the bedroom?  Use night lights.  Make sure that a bedside light is easy to reach.  Do not use oversized bedding that drapes onto the floor.  Have a firm chair that has side arms to use for getting dressed.  Remove throw rugs and other tripping hazards from the floor. What can I do in the kitchen?  Clean up any spills right away.  Avoid walking on wet floors.  Place frequently used items in easy-to-reach places.  If you need to reach for something above you, use a sturdy step stool that has a grab bar.  Keep electrical cables out of the way.  Do not use floor polish or wax that makes  floors slippery. If you have to use wax, make sure that it is non-skid floor wax.  Remove throw rugs and other tripping hazards from the floor. What can I do in the stairways?  Do not leave any items on the stairs.  Make sure that there are handrails on both sides of the stairs. Fix handrails that are broken or loose. Make sure that handrails are as long as the stairways.  Check any carpeting to make sure that it is firmly attached to the stairs. Fix any carpet that is loose or worn.  Avoid having throw rugs at the top or bottom of stairways, or secure the rugs with carpet tape to prevent them from moving.  Make sure that you  have a light switch at the top of the stairs and the bottom of the stairs. If you do not have them, have them installed. What are some other fall prevention tips?  Wear closed-toe shoes that fit well and support your feet. Wear shoes that have rubber soles or low heels.  When you use a stepladder, make sure that it is completely opened and that the sides are firmly locked. Have someone hold the ladder while you are using it. Do not climb a closed stepladder.  Add color or contrast paint or tape to grab bars and handrails in your home. Place contrasting color strips on the first and last steps.  Use mobility aids as needed, such as canes, walkers, scooters, and crutches.  Turn on lights if it is dark. Replace any light bulbs that burn out.  Set up furniture so that there are clear paths. Keep the furniture in the same spot.  Fix any uneven floor surfaces.  Choose a carpet design that does not hide the edge of steps of a stairway.  Be aware of any and all pets.  Review your medicines with your healthcare provider. Some medicines can cause dizziness or changes in blood pressure, which increase your risk of falling. Talk with your health care provider about other ways that you can decrease your risk of falls. This may include working with a physical therapist or trainer to improve your strength, balance, and endurance. This information is not intended to replace advice given to you by your health care provider. Make sure you discuss any questions you have with your health care provider. Document Released: 10/28/2002 Document Revised: 04/05/2016 Document Reviewed: 12/12/2014 Elsevier Interactive Patient Education  2017 [employer].

## 2019-10-07 NOTE — Progress Notes (Signed)
Subjective:    Mirjana Tarleton is a 68 y.o. female who presents for a Welcome to Medicare exam.   Review of Systems No chest pain, shortness of breath, hemoptysis.  No falls.  She reports intermittent left sided abdominal pain that was relieved by bowel movement.  No blood in stool. Cardiac Risk Factors include: advanced age (>72men, >11 women)      Objective:    Today's Vitals   10/07/19 0915  BP: 138/75  Pulse: 66  Temp: (!) 97.3 F (36.3 C)  TempSrc: Temporal  Weight: 171 lb (77.6 kg)  Height: 5\' 6"  (1.676 m)  Body mass index is 27.6 kg/m.  Medications Outpatient Encounter Medications as of 10/07/2019  Medication Sig  . ibuprofen (ADVIL,MOTRIN) 200 MG tablet Take 200 mg by mouth every 6 (six) hours as needed.  Marland Kitchen losartan (COZAAR) 50 MG tablet Take 1 tablet (50 mg total) by mouth daily.  . Omega-3 Fatty Acids (FISH OIL) 1000 MG CAPS Take 1,000 mg by mouth.  . simvastatin (ZOCOR) 20 MG tablet Take 1 tablet (20 mg total) by mouth at bedtime.  . [DISCONTINUED] ondansetron (ZOFRAN) 4 MG tablet Take 1 tablet (4 mg total) by mouth every 8 (eight) hours as needed for nausea or vomiting.  . [DISCONTINUED] pantoprazole (PROTONIX) 20 MG tablet Take 1 tablet (20 mg total) by mouth daily.   No facility-administered encounter medications on file as of 10/07/2019.      History: Past Medical History:  Diagnosis Date  . Allergy   . DJD (degenerative joint disease)   . Hyperlipidemia   . Osteopenia    Past Surgical History:  Procedure Laterality Date  . APPENDECTOMY    . HERNIA REPAIR    . TONSILLECTOMY    . TONSILLECTOMY      Family History  Problem Relation Age of Onset  . Alzheimer's disease Mother   . Cancer Father        stomach cancer  . Diabetes Sister   . Heart disease Brother        MI   Social History   Occupational History  . Occupation: Quality Control  Tobacco Use  . Smoking status: Current Every Day Smoker    Packs/day: 0.50    Years: 40.00    Pack  years: 20.00    Types: Cigarettes  . Smokeless tobacco: Never Used  Substance and Sexual Activity  . Alcohol use: No  . Drug use: No  . Sexual activity: Not on file    Tobacco Counseling Ready to quit: No Counseling given: Yes  Immunizations and Health Maintenance Immunization History  Administered Date(s) Administered  . Fluad Quad(high Dose 65+) 08/21/2019  . Influenza Split 09/05/2013  . Influenza, High Dose Seasonal PF 09/18/2018  . Influenza,inj,Quad PF,6+ Mos 09/13/2016  . Influenza-Unspecified 08/20/2014, 08/25/2015, 09/18/2017  . Pneumococcal Conjugate-13 11/10/2017  . Pneumococcal Polysaccharide-23 11/28/2018  . Tdap 06/11/2010  . Zoster 06/30/2011   There are no preventive care reminders to display for this patient.  Activities of Daily Living In your present state of health, do you have any difficulty performing the following activities: 10/07/2019  Hearing? N  Vision? N  Difficulty concentrating or making decisions? N  Walking or climbing stairs? N  Dressing or bathing? N  Doing errands, shopping? N  Preparing Food and eating ? N  Using the Toilet? N  In the past six months, have you accidently leaked urine? N  Do you have problems with loss of bowel control? N  Managing  your Medications? N  Managing your Finances? N  Housekeeping or managing your Housekeeping? N  Some recent data might be hidden    Physical Exam   General: Well groomed.  Well appearing. HEENT: Sclera white.  Moist mucous membranes Cardio: Regular rate and rhythm.  S1-S2 heard.  No murmurs Pulmonary: Prolonged expiratory phase.  No wheezes, rhonchi or rales.  Normal work of breathing on room air. Psych: Mood stable.  Eye contact fair.  Advanced Directives: Does Patient Have a Medical Advance Directive?: No Would patient like information on creating a medical advance directive?: No - Patient declined    Assessment:    This is a routine wellness examination for this patient .  We  discussed advanced directive today.  A packet was given to her.  She will review and return to me.  Unfortunately she has no relatives but will complete this form in case of an emergency so that we know her wishes.  Vision/Hearing screen  Hearing Screening   125Hz  250Hz  500Hz  1000Hz  2000Hz  3000Hz  4000Hz  6000Hz  8000Hz   Right ear:           Left ear:           Vision Screening Comments: Eye exam this year 2020 done at [facility]  Annette Stable issues and exercise activities discussed:  Current Exercise Habits: The patient does not participate in regular exercise at present  Goals   None    Depression Screen PHQ 2/9 Scores 01/31/2019 12/19/2018 11/28/2018 09/06/2018  PHQ - 2 Score 0 0 0 0  PHQ- 9 Score - 0 - -     Fall Risk Fall Risk  10/07/2019  Falls in the past year? 0  Number falls in past yr: -  Injury with Fall? -    Cognitive Function:     6CIT Screen 10/07/2019  What Year? 0 points  What month? 0 points  What time? 0 points  Count back from 20 0 points  Months in reverse 2 points  Repeat phrase 0 points  Total Score 2    Patient Care Team: [name], DO as PCP - General (Family Medicine) Gala Romney, Cristopher Estimable, MD as Consulting Physician (Gastroenterology)     Plan:   She will return advanced directive to the office.  I counseled her on smoking cessation.  Will obtain fasting labs today.  Declined Shingles vaccine due to cost today. She will follow-up for scheduled office visits  I have personally reviewed and noted the following in the patient's chart:   . Medical and social history . Use of alcohol, tobacco or illicit drugs  . Current medications and supplements . Functional ability and status . Nutritional status . Physical activity . Advanced directives . List of other physicians . Hospitalizations, surgeries, and ER visits in previous 12 months . Vitals . Screenings to include cognitive, depression, and falls . Referrals and appointments  In  addition, I have reviewed and discussed with patient certain preventive protocols, quality metrics, and best practice recommendations. A written personalized care plan for preventive services as well as general preventive health recommendations were provided to patient.     Ronnie Doss, DO 10/07/2019

## 2019-11-25 ENCOUNTER — Telehealth: Payer: Self-pay | Admitting: Family Medicine

## 2019-11-25 DIAGNOSIS — E782 Mixed hyperlipidemia: Secondary | ICD-10-CM

## 2019-11-25 DIAGNOSIS — I10 Essential (primary) hypertension: Secondary | ICD-10-CM

## 2019-11-25 MED ORDER — LOSARTAN POTASSIUM 50 MG PO TABS: 50.0000 mg | ORAL_TABLET | Freq: Every day | ORAL | 1 refills | Status: DC

## 2019-11-25 MED ORDER — SIMVASTATIN 20 MG PO TABS: 20.0000 mg | ORAL_TABLET | Freq: Every day | ORAL | 1 refills | Status: DC

## 2019-11-25 NOTE — Telephone Encounter (Signed)
Pt aware refill sent to [facility]

## 2019-11-25 NOTE — Telephone Encounter (Signed)
What is the name of the medication? loSartan (COZAAR) 50 MG tablet simvastatin (ZOCOR) 20 MG tablet    Have you contacted your pharmacy to request a refill? YES  Which pharmacy would you like this sent to? [facility] order [facility]    Patient notified that their request is being sent to the clinical staff for review and that they should receive a call once it is complete. If they do not receive a call within 24 hours they can check with their pharmacy or our office.

## 2019-12-13 ENCOUNTER — Ambulatory Visit (INDEPENDENT_AMBULATORY_CARE_PROVIDER_SITE_OTHER): Payer: Medicare HMO | Admitting: Family Medicine

## 2019-12-13 DIAGNOSIS — J0110 Acute frontal sinusitis, unspecified: Secondary | ICD-10-CM

## 2019-12-13 MED ORDER — FLUTICASONE PROPIONATE 50 MCG/ACT NA SUSP: 2.0000 | Freq: Every day | NASAL | 6 refills | Status: DC

## 2019-12-13 MED ORDER — AMOXICILLIN-POT CLAVULANATE 875-125 MG PO TABS: 1.0000 | ORAL_TABLET | Freq: Two times a day (BID) | ORAL | 0 refills | Status: DC

## 2019-12-13 MED ORDER — BENZONATATE 200 MG PO CAPS: 200.0000 mg | ORAL_CAPSULE | Freq: Two times a day (BID) | ORAL | 0 refills | Status: DC | PRN

## 2019-12-13 NOTE — Progress Notes (Signed)
Telephone visit  Subjective: CC: Sinusitis/ allergy PCP: [name], DO VEL:FYBOFB Mclear is a 69 y.o. female calls for telephone consult today. Patient provides verbal consent for consult held via phone.  Due to COVID-19 pandemic this visit was conducted virtually. This visit type was conducted due to national recommendations for restrictions regarding the COVID-19 Pandemic (e.g. social distancing, sheltering in place) in an effort to limit this patient's exposure and mitigate transmission in our community. All issues noted in this document were discussed and addressed.  A physical exam was not performed with this format.   Location of patient: home Location of provider: [facility] Others present for call: none  1. Sinusitis/ allergies Patient reports onset of cough and sinus pressure that started 1.5 weeks ago.  She reports postnasal drip that propagates nonproductive cough.  She reports headache.  Denies fevers, nausea, vomiting, diarrhea, shortness of breath, wheezing.  No known sick contacts.  She gets this about twice per year.  She has been using motrin and a vicks inhaler that helps some.  She is not using any antihistamine.    ROS: Per HPI  No Known Allergies Past Medical History:  Diagnosis Date  . Allergy   . DJD (degenerative joint disease)   . Hyperlipidemia   . Osteopenia     Current Outpatient Medications:  .  ibuprofen (ADVIL,MOTRIN) 200 MG tablet, Take 200 mg by mouth every 6 (six) hours as needed., Disp: , Rfl:  .  losartan (COZAAR) 50 MG tablet, Take 1 tablet (50 mg total) by mouth daily., Disp: 90 tablet, Rfl: 1 .  Omega-3 Fatty Acids (FISH OIL) 1000 MG CAPS, Take 1,000 mg by mouth., Disp: , Rfl:  .  simvastatin (ZOCOR) 20 MG tablet, Take 1 tablet (20 mg total) by mouth at bedtime., Disp: 90 tablet, Rfl: 1  Assessment/ Plan: 69 y.o. female   1. Acute non-recurrent frontal sinusitis Will empirically treat with anitbiotics given duration of symptoms.  We  discussed isolation precautions per CDC guidelines.  She notes that she actually had her first Covid vaccine.  She is due for repeat in February.  Push oral fluids.  Reasons for return and emergent valuation first part discussed.  She will follow as needed - fluticasone (FLONASE) 50 MCG/ACT nasal spray; Place 2 sprays into both nostrils daily.  Dispense: 16 g; Refill: 6 - benzonatate (TESSALON) 200 MG capsule; Take 1 capsule (200 mg total) by mouth 2 (two) times daily as needed for cough.  Dispense: 20 capsule; Refill: 0 - amoxicillin-clavulanate (AUGMENTIN) 875-125 MG tablet; Take 1 tablet by mouth 2 (two) times daily.  Dispense: 20 tablet; Refill: 0  Start time: 8:08am End time: 8:15am  Total time spent on patient care (including telephone call/ virtual visit): 15 minutes  [place], [place] [phone]

## 2020-03-09 ENCOUNTER — Encounter: Payer: Self-pay | Admitting: Family Medicine

## 2020-03-09 ENCOUNTER — Ambulatory Visit (INDEPENDENT_AMBULATORY_CARE_PROVIDER_SITE_OTHER): Payer: Medicare HMO | Admitting: Family Medicine

## 2020-03-09 DIAGNOSIS — J0100 Acute maxillary sinusitis, unspecified: Secondary | ICD-10-CM | POA: Diagnosis not present

## 2020-03-09 DIAGNOSIS — J301 Allergic rhinitis due to pollen: Secondary | ICD-10-CM | POA: Diagnosis not present

## 2020-03-09 MED ORDER — FEXOFENADINE-PSEUDOEPHED ER 180-240 MG PO TB24: 1.0000 | ORAL_TABLET | Freq: Every evening | ORAL | 11 refills | Status: DC

## 2020-03-09 MED ORDER — AMOXICILLIN-POT CLAVULANATE 875-125 MG PO TABS: 1.0000 | ORAL_TABLET | Freq: Two times a day (BID) | ORAL | 0 refills | Status: DC

## 2020-03-09 NOTE — Progress Notes (Signed)
Subjective:    Patient ID: Elizabeth Wu, female    DOB: 11-15-1951, 69 y.o.   MRN: [id number]   HPI: Mariela Rex is a 69 y.o. female presenting for Symptoms include congestion, facial pain, nasal congestion, occasional productive cough, post nasal drip and sinus pressure. There is no fever, chills, or sweats. Onset of symptoms was a few days ago, gradually worsening since that time. Triggere by allergy.     Depression screen [facility] 2/9 01/31/2019 12/19/2018 11/28/2018 09/06/2018 03/07/2018  Decreased Interest 0 0 0 0 0  Down, Depressed, Hopeless 0 0 0 0 0  PHQ - 2 Score 0 0 0 0 0  Altered sleeping - 0 - - -  Tired, decreased energy - 0 - - -  Change in appetite - 0 - - -  Feeling bad or failure about yourself  - 0 - - -  Trouble concentrating - 0 - - -  Moving slowly or fidgety/restless - 0 - - -  Suicidal thoughts - 0 - - -  PHQ-9 Score - 0 - - -  Difficult doing work/chores - Not difficult at all - - -     Relevant past medical, surgical, family and social history reviewed and updated as indicated.  Interim medical history since our last visit reviewed. Allergies and medications reviewed and updated.  ROS:  Review of Systems   Social History   Tobacco Use  Smoking Status Current Every Day Smoker  . Packs/day: 0.50  . Years: 40.00  . Pack years: 20.00  . Types: Cigarettes  Smokeless Tobacco Never Used       Objective:     Wt Readings from Last 3 Encounters:  10/07/19 171 lb (77.6 kg)  01/31/19 170 lb (77.1 kg)  12/19/18 173 lb (78.5 kg)     Exam deferred. Pt. Harboring due to COVID 19. Phone visit performed.   Assessment & Plan:   1. Acute maxillary sinusitis, recurrence not specified   2. Seasonal allergic rhinitis due to pollen     Meds ordered this encounter  Medications  . fexofenadine-pseudoephedrine (ALLEGRA-D 24) 180-240 MG 24 hr tablet    Sig: Take 1 tablet by mouth every evening. For allergy and congestion    Dispense:  30 tablet    Refill:   11  . amoxicillin-clavulanate (AUGMENTIN) 875-125 MG tablet    Sig: Take 1 tablet by mouth 2 (two) times daily. Take all of this medication    Dispense:  20 tablet    Refill:  0    No orders of the defined types were placed in this encounter.     Diagnoses and all orders for this visit:  Acute maxillary sinusitis, recurrence not specified  Seasonal allergic rhinitis due to pollen  Other orders -     fexofenadine-pseudoephedrine (ALLEGRA-D 24) 180-240 MG 24 hr tablet; Take 1 tablet by mouth every evening. For allergy and congestion -     amoxicillin-clavulanate (AUGMENTIN) 875-125 MG tablet; Take 1 tablet by mouth 2 (two) times daily. Take all of this medication    Virtual Visit via telephone Note  I discussed the limitations, risks, security and privacy concerns of performing an evaluation and management service by telephone and the availability of in person appointments. The patient was identified with two identifiers. Pt.expressed understanding and agreed to proceed. Pt. Is at home. Dr. Livia Snellen is in his office.  Follow Up Instructions:   I discussed the assessment and treatment plan with the patient. The patient was provided  an opportunity to ask questions and all were answered. The patient agreed with the plan and demonstrated an understanding of the instructions.   The patient was advised to call back or seek an in-person evaluation if the symptoms worsen or if the condition fails to improve as anticipated.   Total minutes including chart review and phone contact time: 14   Follow up plan: Return in about 1 month (around 04/08/2020), or if symptoms worsen or fail to improve.  Claretta Fraise, MD [place]

## 2020-03-16 ENCOUNTER — Telehealth: Payer: Self-pay | Admitting: Family Medicine

## 2020-03-16 NOTE — Telephone Encounter (Signed)
Scheduled pt with Dr. Warrick Parisian tomorrow afternoon during after hours clinic.

## 2020-03-17 ENCOUNTER — Ambulatory Visit (INDEPENDENT_AMBULATORY_CARE_PROVIDER_SITE_OTHER): Payer: Medicare HMO | Admitting: Family Medicine

## 2020-03-17 ENCOUNTER — Encounter: Payer: Self-pay | Admitting: Family Medicine

## 2020-03-17 DIAGNOSIS — J301 Allergic rhinitis due to pollen: Secondary | ICD-10-CM

## 2020-03-17 DIAGNOSIS — R110 Nausea: Secondary | ICD-10-CM

## 2020-03-17 MED ORDER — ONDANSETRON HCL 4 MG PO TABS: 4.0000 mg | ORAL_TABLET | Freq: Three times a day (TID) | ORAL | 1 refills | Status: DC | PRN

## 2020-03-17 MED ORDER — PREDNISONE 20 MG PO TABS: ORAL_TABLET | ORAL | 0 refills | Status: DC

## 2020-03-17 NOTE — Progress Notes (Signed)
Virtual Visit via telephone Note  I connected with Elizabeth Wu on 03/17/20 at 1800 by telephone and verified that I am speaking with the correct person using two identifiers. Elizabeth Wu is currently located at home and no other people are currently with her during visit. The provider, Fransisca Kaufmann Najae Rathert, MD is located in their office at time of visit.  Call ended at 1807  I discussed the limitations, risks, security and privacy concerns of performing an evaluation and management service by telephone and the availability of in person appointments. I also discussed with the patient that there may be a patient responsible charge related to this service. The patient expressed understanding and agreed to proceed.   History and Present Illness: Patient is calling in for cough and mucous secretion and has been taking allegra and took augmentin and tessalon perles.  She is feeling nauseated over the past 2 weeks. She had some nausea pills but they were expired.  She had been nauseated because of the drainage.  She did not see too much improvement from augmentin.   No diagnosis found.  Outpatient Encounter Medications as of 03/17/2020  Medication Sig  . amoxicillin-clavulanate (AUGMENTIN) 875-125 MG tablet Take 1 tablet by mouth 2 (two) times daily. Take all of this medication  . benzonatate (TESSALON) 200 MG capsule Take 1 capsule (200 mg total) by mouth 2 (two) times daily as needed for cough.  . fexofenadine-pseudoephedrine (ALLEGRA-D 24) 180-240 MG 24 hr tablet Take 1 tablet by mouth every evening. For allergy and congestion  . fluticasone (FLONASE) 50 MCG/ACT nasal spray Place 2 sprays into both nostrils daily.  Marland Kitchen ibuprofen (ADVIL,MOTRIN) 200 MG tablet Take 200 mg by mouth every 6 (six) hours as needed.  Marland Kitchen losartan (COZAAR) 50 MG tablet Take 1 tablet (50 mg total) by mouth daily.  . Omega-3 Fatty Acids (FISH OIL) 1000 MG CAPS Take 1,000 mg by mouth.  . simvastatin (ZOCOR) 20 MG tablet Take 1  tablet (20 mg total) by mouth at bedtime.   No facility-administered encounter medications on file as of 03/17/2020.    Review of Systems  Constitutional: Negative for chills and fever.  HENT: Positive for congestion, postnasal drip, rhinorrhea, sinus pressure, sneezing and sore throat. Negative for ear discharge and ear pain.   Eyes: Negative for pain, redness and visual disturbance.  Respiratory: Positive for cough. Negative for chest tightness and shortness of breath.   Cardiovascular: Negative for chest pain and leg swelling.  Gastrointestinal: Positive for nausea. Negative for constipation and vomiting.  Genitourinary: Negative for difficulty urinating and dysuria.  Musculoskeletal: Negative for back pain and gait problem.  Skin: Negative for rash.  Neurological: Negative for light-headedness and headaches.  Psychiatric/Behavioral: Negative for agitation and behavioral problems.  All other systems reviewed and are negative.   Observations/Objective: Patient sounds comfortable and in no acute distress.  Assessment and Plan: Problem List Items Addressed This Visit    None    Visit Diagnoses    Nausea    -  Primary   Relevant Medications   ondansetron (ZOFRAN) 4 MG tablet   Seasonal allergic rhinitis due to pollen       Relevant Medications   predniSONE (DELTASONE) 20 MG tablet      Finish antibiotic and gave prednisone and zofran for nausea. Follow up plan: Return if symptoms worsen or fail to improve.     I discussed the assessment and treatment plan with the patient. The patient was provided an opportunity to ask  questions and all were answered. The patient agreed with the plan and demonstrated an understanding of the instructions.   The patient was advised to call back or seek an in-person evaluation if the symptoms worsen or if the condition fails to improve as anticipated.  The above assessment and management plan was discussed with the patient. The patient  verbalized understanding of and has agreed to the management plan. Patient is aware to call the clinic if symptoms persist or worsen. Patient is aware when to return to the clinic for a follow-up visit. Patient educated on when it is appropriate to go to the emergency department.    I provided 7 minutes of non-face-to-face time during this encounter.    Worthy Rancher, MD

## 2020-04-23 ENCOUNTER — Other Ambulatory Visit: Payer: Self-pay | Admitting: Family Medicine

## 2020-04-23 DIAGNOSIS — E782 Mixed hyperlipidemia: Secondary | ICD-10-CM

## 2020-04-23 DIAGNOSIS — I10 Essential (primary) hypertension: Secondary | ICD-10-CM

## 2020-05-20 ENCOUNTER — Other Ambulatory Visit: Payer: Self-pay

## 2020-05-20 ENCOUNTER — Encounter: Payer: Self-pay | Admitting: Family Medicine

## 2020-05-20 ENCOUNTER — Ambulatory Visit (INDEPENDENT_AMBULATORY_CARE_PROVIDER_SITE_OTHER): Payer: Medicare HMO | Admitting: Family Medicine

## 2020-05-20 VITALS — BP 137/77 | HR 70 | Temp 97.0°F | Ht 66.0 in | Wt 165.0 lb

## 2020-05-20 DIAGNOSIS — Z720 Tobacco use: Secondary | ICD-10-CM

## 2020-05-20 DIAGNOSIS — I10 Essential (primary) hypertension: Secondary | ICD-10-CM

## 2020-05-20 DIAGNOSIS — E782 Mixed hyperlipidemia: Secondary | ICD-10-CM

## 2020-05-20 DIAGNOSIS — K5909 Other constipation: Secondary | ICD-10-CM | POA: Diagnosis not present

## 2020-05-20 MED ORDER — LOSARTAN POTASSIUM 50 MG PO TABS: 50.0000 mg | ORAL_TABLET | Freq: Every day | ORAL | 3 refills | Status: DC

## 2020-05-20 MED ORDER — LINACLOTIDE 290 MCG PO CAPS: 290.0000 ug | ORAL_CAPSULE | Freq: Every day | ORAL | 3 refills | Status: DC

## 2020-05-20 MED ORDER — SIMVASTATIN 20 MG PO TABS: 20.0000 mg | ORAL_TABLET | Freq: Every day | ORAL | 3 refills | Status: DC

## 2020-05-20 MED ORDER — LINACLOTIDE 290 MCG PO CAPS: 290.0000 ug | ORAL_CAPSULE | Freq: Every day | ORAL | 0 refills | Status: DC

## 2020-05-20 NOTE — Progress Notes (Signed)
Subjective: CC: Follow-up hypertension, hyperlipidemia PCP: [name], DO [id number] Elizabeth Wu is a 69 y.o. female presenting to clinic today for:  1.  Hypertension with hyperlipidemia Patient reports compliance with Zocor, Cozaar.  No chest pain, shortness of breath, lower extremity edema.  2.  Tobacco use disorder/ constipation Patient with recent upper respiratory infection.  She notes she has ongoing thick mucus in the back of her throat.  Denies any shortness of breath, hemoptysis, night sweats.  She has had a little bit of weight loss since last year but she attributes this to decreased appetite in the setting of nausea and constipation.  She admits to not drinking much water.  She did have some Linzess on hand which did work well to help with constipation previously but she is out.  She has a bowel movement maybe every 2 to 3 days.  Sometimes she will have migratory abdominal pain that is sharp in nature.  No hematochezia or melena.  ROS: Per HPI  No Known Allergies Past Medical History:  Diagnosis Date  . Allergy   . DJD (degenerative joint disease)   . Hyperlipidemia   . Osteopenia     Current Outpatient Medications:  .  ibuprofen (ADVIL,MOTRIN) 200 MG tablet, Take 200 mg by mouth every 6 (six) hours as needed., Disp: , Rfl:  .  losartan (COZAAR) 50 MG tablet, TAKE 1 TABLET (50 MG TOTAL) BY MOUTH DAILY., Disp: 90 tablet, Rfl: 0 .  ondansetron (ZOFRAN) 4 MG tablet, Take 1 tablet (4 mg total) by mouth every 8 (eight) hours as needed for nausea or vomiting., Disp: 30 tablet, Rfl: 1 .  simvastatin (ZOCOR) 20 MG tablet, TAKE 1 TABLET (20 MG TOTAL) BY MOUTH AT BEDTIME., Disp: 90 tablet, Rfl: 0 .  linaclotide (LINZESS) 290 MCG CAPS capsule, Take 1 capsule (290 mcg total) by mouth daily before breakfast., Disp: 8 capsule, Rfl: 0 .  linaclotide (LINZESS) 290 MCG CAPS capsule, Take 1 capsule (290 mcg total) by mouth daily before breakfast. For constipation, Disp: 90 capsule, Rfl:  3 Social History   Socioeconomic History  . Marital status: Single    Spouse name: Not on file  . Number of children: Not on file  . Years of education: Not on file  . Highest education level: 12th grade  Occupational History  . Occupation: Quality Control  Tobacco Use  . Smoking status: Current Every Day Smoker    Packs/day: 0.50    Years: 40.00    Pack years: 20.00    Types: Cigarettes  . Smokeless tobacco: Never Used  Vaping Use  . Vaping Use: Never used  Substance and Sexual Activity  . Alcohol use: No  . Drug use: No  . Sexual activity: Not on file  Other Topics Concern  . Not on file  Social History Narrative  . Not on file   Social Determinants of Health   Financial Resource Strain: Low Risk   . Difficulty of Paying Living Expenses: Not hard at all  Food Insecurity: No Food Insecurity  . Worried About Charity fundraiser in the Last Year: Never true  . Ran Out of Food in the Last Year: Never true  Transportation Needs: No Transportation Needs  . Lack of Transportation (Medical): No  . Lack of Transportation (Non-Medical): No  Physical Activity: Inactive  . Days of Exercise per Week: 0 days  . Minutes of Exercise per Session: 0 min  Stress: No Stress Concern Present  . Feeling of Stress :  Not at all  Social Connections: Socially Isolated  . Frequency of Communication with Friends and Family: Once a week  . Frequency of Social Gatherings with Friends and Family: Once a week  . Attends Religious Services: Never  . Active Member of Clubs or Organizations: No  . Attends Archivist Meetings: Never  . Marital Status: Never married  Intimate Partner Violence: Not At Risk  . Fear of Current or Ex-Partner: No  . Emotionally Abused: No  . Physically Abused: No  . Sexually Abused: No   Family History  Problem Relation Age of Onset  . Alzheimer's disease Mother   . Cancer Father        stomach cancer  . Diabetes Sister   . Heart disease Brother         MI    Objective: Office vital signs reviewed. BP 137/77   Pulse 70   Temp (!) 97 F (36.1 C) (Temporal)   Ht _0  (1.676 m)   Wt 165 lb (74.8 kg)   SpO2 97%   BMI 26.63 kg/m   Physical Examination:  General: Awake, alert, well nourished, No acute distress HEENT: Normal; dentition poor.  Missing several teeth Cardio: regular rate and rhythm, S1S2 heard, no murmurs appreciated Pulm: Globally decreased breath sounds.  No wheezes, rhonchi or rales.  Normal work of breathing on room air GI: soft, non-tender, non-distended Extremities: warm, well perfused, No edema, cyanosis or clubbing; +2 pulses bilaterally  Assessment/ Plan: 69 y.o. female   1. Essential hypertension Blood pressure control.  Continue current.  Check labs - CMP14+EGFR - Lipid Panel - losartan (COZAAR) 50 MG tablet; Take 1 tablet (50 mg total) by mouth daily.  Dispense: 90 tablet; Refill: 3  2. Mixed hyperlipidemia Check fasting labs.  Continue statin - CMP14+EGFR - Lipid Panel - TSH - simvastatin (ZOCOR) 20 MG tablet; Take 1 tablet (20 mg total) by mouth at bedtime.  Dispense: 90 tablet; Refill: 3  3. Tobacco use Reinforced need for smoking cessation.  Likely contributing to the phlegm production. - CBC  4. Chronic constipation Increase water intake.  Handout provided.  I have renewed Linzess and given her 8 pills of samples.  If this is not affordable, we will consider alternatives like Trulance or Amitiza - linaclotide (LINZESS) 290 MCG CAPS capsule; Take 1 capsule (290 mcg total) by mouth daily before breakfast.  Dispense: 8 capsule; Refill: 0 - linaclotide (LINZESS) 290 MCG CAPS capsule; Take 1 capsule (290 mcg total) by mouth daily before breakfast. For constipation  Dispense: 90 capsule; Refill: 3   Orders Placed This Encounter  Procedures  . CMP14+EGFR  . Lipid Panel  . TSH  . CBC   Meds ordered this encounter  Medications  . linaclotide (LINZESS) 290 MCG CAPS capsule    Sig: Take 1  capsule (290 mcg total) by mouth daily before breakfast.    Dispense:  8 capsule    Refill:  0  . linaclotide (LINZESS) 290 MCG CAPS capsule    Sig: Take 1 capsule (290 mcg total) by mouth daily before breakfast. For constipation    Dispense:  90 capsule    Refill:  [place], [place] [phone]

## 2020-05-20 NOTE — Patient Instructions (Addendum)
You had labs performed today.  You will be contacted with the results of the labs once they are available, usually in the next 3 business days for routine lab work.  If you have an active my chart account, they will be released to your MyChart.  If you prefer to have these labs released to you via telephone, please let us know.  If you had a pap smear or biopsy performed, expect to be contacted in about 7-10 days.    Constipation, Adult Constipation is when a person:  Poops (has a bowel movement) fewer times in a week than normal.  Has a hard time pooping.  Has poop that is dry, hard, or bigger than normal. Follow these instructions at home: Eating and drinking   Eat foods that have a lot of fiber, such as: ? Fresh fruits and vegetables. ? Whole grains. ? Beans.  Eat less of foods that are high in fat, low in fiber, or overly processed, such as: ? [place] fries. ? Hamburgers. ? Cookies. ? Candy. ? Soda.  Drink enough fluid to keep your pee (urine) clear or pale yellow. General instructions  Exercise regularly or as told by your doctor.  Go to the restroom when you feel like you need to poop. Do not hold it in.  Take over-the-counter and prescription medicines only as told by your doctor. These include any fiber supplements.  Do pelvic floor retraining exercises, such as: ? Doing deep breathing while relaxing your lower belly (abdomen). ? Relaxing your pelvic floor while pooping.  Watch your condition for any changes.  Keep all follow-up visits as told by your doctor. This is important. Contact a doctor if:  You have pain that gets worse.  You have a fever.  You have not pooped for 4 days.  You throw up (vomit).  You are not hungry.  You lose weight.  You are bleeding from the anus.  You have thin, pencil-like poop (stool). Get help right away if:  You have a fever, and your symptoms suddenly get worse.  You leak poop or have blood in your  poop.  Your belly feels hard or bigger than normal (is bloated).  You have very bad belly pain.  You feel dizzy or you faint. This information is not intended to replace advice given to you by your health care provider. Make sure you discuss any questions you have with your health care provider. Document Revised: 10/20/2017 Document Reviewed: 04/27/2016 Elsevier Patient Education  2020 [employer].

## 2020-05-21 LAB — CBC
Hematocrit: 37.3 % (ref 34.0–46.6)
Hemoglobin: 12.7 g/dL (ref 11.1–15.9)
MCH: 32.8 pg (ref 26.6–33.0)
MCHC: 34 g/dL (ref 31.5–35.7)
MCV: 96 fL (ref 79–97)
Platelets: 376 10*3/uL (ref 150–450)
RBC: 3.87 x10E6/uL (ref 3.77–5.28)
RDW: 13.4 % (ref 11.7–15.4)
WBC: 7.4 10*3/uL (ref 3.4–10.8)

## 2020-05-21 LAB — CMP14+EGFR
ALT: 13 IU/L (ref 0–32)
AST: 12 IU/L (ref 0–40)
Albumin/Globulin Ratio: 1.7 (ref 1.2–2.2)
Albumin: 4.3 g/dL (ref 3.8–4.8)
Alkaline Phosphatase: 121 IU/L (ref 48–121)
BUN/Creatinine Ratio: 18 (ref 12–28)
BUN: 15 mg/dL (ref 8–27)
Bilirubin Total: 0.9 mg/dL (ref 0.0–1.2)
CO2: 24 mmol/L (ref 20–29)
Calcium: 9.5 mg/dL (ref 8.7–10.3)
Chloride: 102 mmol/L (ref 96–106)
Creatinine, Ser: 0.82 mg/dL (ref 0.57–1.00)
GFR calc Af Amer: 85 mL/min/{1.73_m2} (ref >59)
GFR calc non Af Amer: 74 mL/min/{1.73_m2} (ref >59)
Globulin, Total: 2.6 g/dL (ref 1.5–4.5)
Glucose: 90 mg/dL (ref 65–99)
Potassium: 3.9 mmol/L (ref 3.5–5.2)
Sodium: 143 mmol/L (ref 134–144)
Total Protein: 6.9 g/dL (ref 6.0–8.5)

## 2020-05-21 LAB — LIPID PANEL
Chol/HDL Ratio: 4.3 ratio (ref 0.0–4.4)
Cholesterol, Total: 183 mg/dL (ref 100–199)
HDL: 43 mg/dL (ref >39)
LDL Chol Calc (NIH): 117 mg/dL — ABNORMAL HIGH (ref 0–99)
Triglycerides: 130 mg/dL (ref 0–149)
VLDL Cholesterol Cal: 23 mg/dL (ref 5–40)

## 2020-05-21 LAB — TSH: TSH: 2.53 u[IU]/mL (ref 0.450–4.500)

## 2020-06-15 ENCOUNTER — Other Ambulatory Visit: Payer: Self-pay | Admitting: Family Medicine

## 2020-06-15 DIAGNOSIS — Z1231 Encounter for screening mammogram for malignant neoplasm of breast: Secondary | ICD-10-CM

## 2020-06-29 ENCOUNTER — Telehealth: Payer: Self-pay | Admitting: Family Medicine

## 2020-06-29 NOTE — Telephone Encounter (Signed)
Pt states that the linaclotide (LINZESS) 290 MCG CAPS capsule Is to expensive for her and requesting something else to be ordered once her 90 day rx that she has is out.

## 2020-06-30 MED ORDER — TRULANCE 3 MG PO TABS: 1.0000 | ORAL_TABLET | Freq: Every day | ORAL | 5 refills | Status: DC

## 2020-06-30 NOTE — Telephone Encounter (Signed)
Trulance to replace Linzess.  This has been sent to [facility]

## 2020-07-01 ENCOUNTER — Telehealth: Payer: Self-pay | Admitting: *Deleted

## 2020-07-01 NOTE — Telephone Encounter (Signed)
Prior Auth for Trulance 3mg -In Process  Key: [id number]   PA Case ID: [id number]  Your information has been submitted to [facility]. [employer] will review the request and will issue a decision, typically within 3-7 days from your submission. You can check the updated outcome later by reopening this request.  If [employer] has not responded in 3-7 days or if you have any questions about your ePA request, please contact [employer] at [phone]. If you think there may be a problem with your PA request, use our live chat feature at the bottom right.

## 2020-07-02 NOTE — Telephone Encounter (Signed)
PA for Trulance 3 mg denied. Non formulary

## 2020-07-03 NOTE — Telephone Encounter (Signed)
Pt informed of all. She had already picked up the three month supply of Linzess. She will try that first.

## 2020-07-03 NOTE — Telephone Encounter (Signed)
Please inform that insurance does not cover alternatives to Linzess.  She can use OTC stool softeners and Miralax if needed.  Encourage fiber, hydration.

## 2020-07-14 ENCOUNTER — Telehealth: Payer: Self-pay | Admitting: *Deleted

## 2020-07-14 NOTE — Telephone Encounter (Signed)
PA for Trulance 3 mg came in today

## 2020-08-14 ENCOUNTER — Ambulatory Visit (INDEPENDENT_AMBULATORY_CARE_PROVIDER_SITE_OTHER): Payer: Medicare HMO

## 2020-08-14 ENCOUNTER — Other Ambulatory Visit: Payer: Self-pay

## 2020-08-14 DIAGNOSIS — Z23 Encounter for immunization: Secondary | ICD-10-CM

## 2020-09-16 ENCOUNTER — Other Ambulatory Visit: Payer: Self-pay

## 2020-09-16 ENCOUNTER — Ambulatory Visit (INDEPENDENT_AMBULATORY_CARE_PROVIDER_SITE_OTHER): Payer: Medicare HMO

## 2020-09-16 DIAGNOSIS — Z23 Encounter for immunization: Secondary | ICD-10-CM | POA: Diagnosis not present

## 2020-09-16 NOTE — Progress Notes (Signed)
   Covid-19 Vaccination Clinic  Name:  Elizabeth Wu    MRN: [id number] DOB: [birth year]  09/16/2020  [name] was observed post Covid-19 immunization for 15 minutes without incident. She was provided with Vaccine Information Sheet and instruction to access the V-Safe system.   [name] was instructed to call 911 with any severe reactions post vaccine: Marland Kitchen Difficulty breathing  . Swelling of face and throat  . A fast heartbeat  . A bad rash all over body  . Dizziness and weakness

## 2020-10-28 ENCOUNTER — Ambulatory Visit
Admission: RE | Admit: 2020-10-28 | Discharge: 2020-10-28 | Disposition: A | Discharge status: Home / Self Care | Payer: Medicare HMO | Source: Ambulatory Visit | Attending: Family Medicine | Admitting: Family Medicine

## 2020-10-28 ENCOUNTER — Other Ambulatory Visit: Payer: Self-pay

## 2020-10-28 DIAGNOSIS — Z1231 Encounter for screening mammogram for malignant neoplasm of breast: Secondary | ICD-10-CM | POA: Diagnosis not present

## 2020-10-28 IMAGING — MG DIGITAL SCREENING BILAT W/ TOMO W/ CAD
8 series · 8 of 24 positions shown · non-contrast
Comparison: Previous exam(s).

CLINICAL DATA: Screening.

EXAM:
DIGITAL SCREENING BILATERAL MAMMOGRAM WITH TOMO AND CAD

[R CC synth-2D]
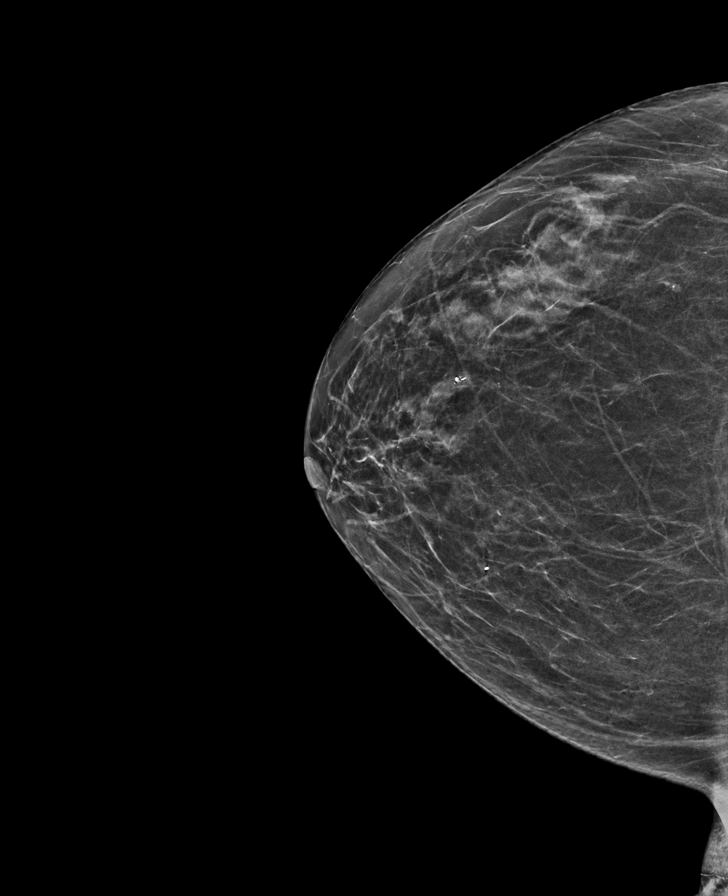

[R MLO synth-2D]
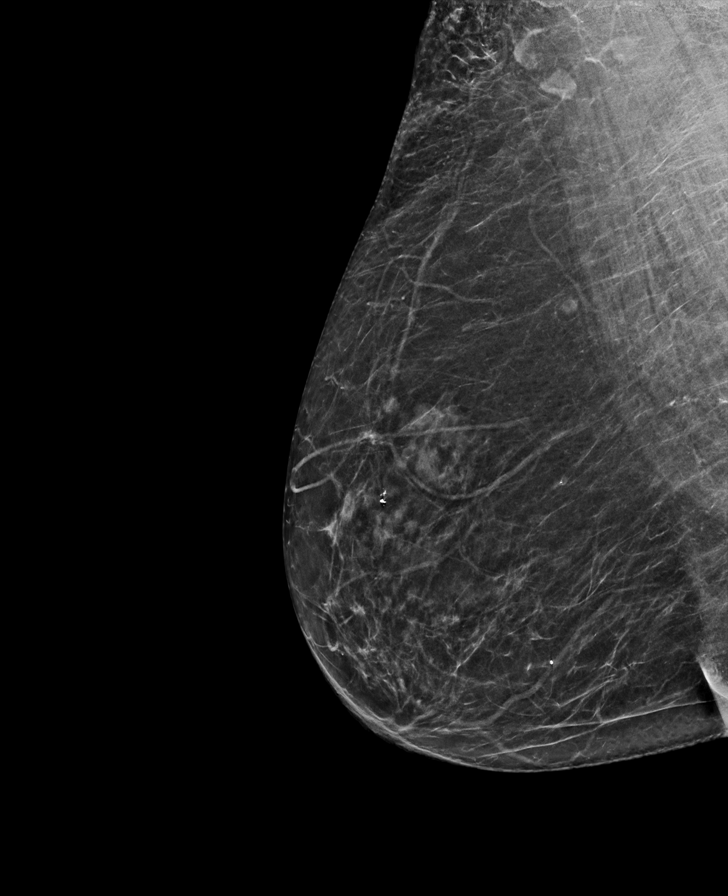

[L MLO synth-2D]
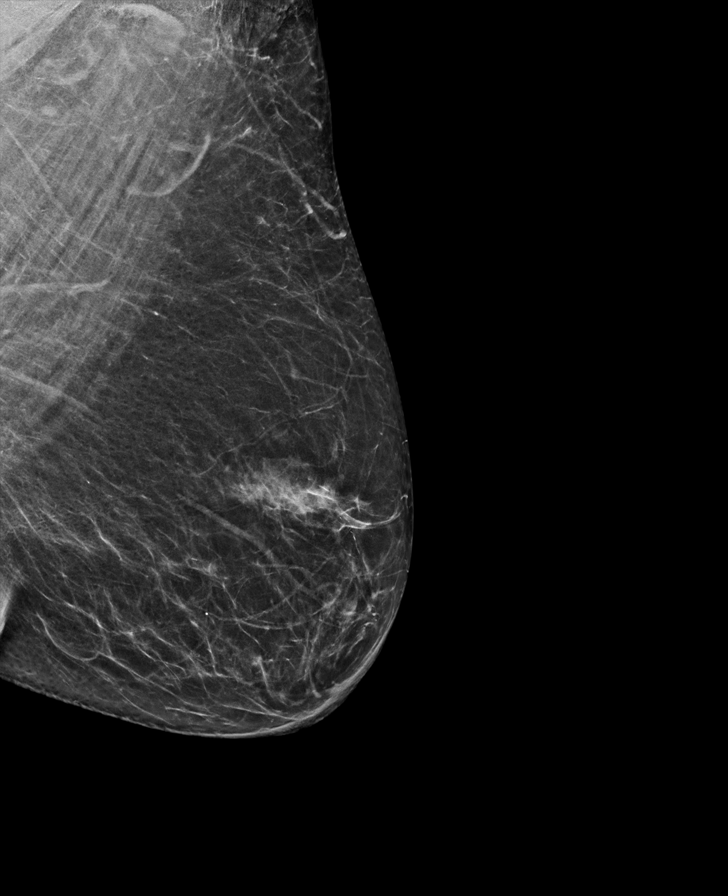

[L CC synth-2D]
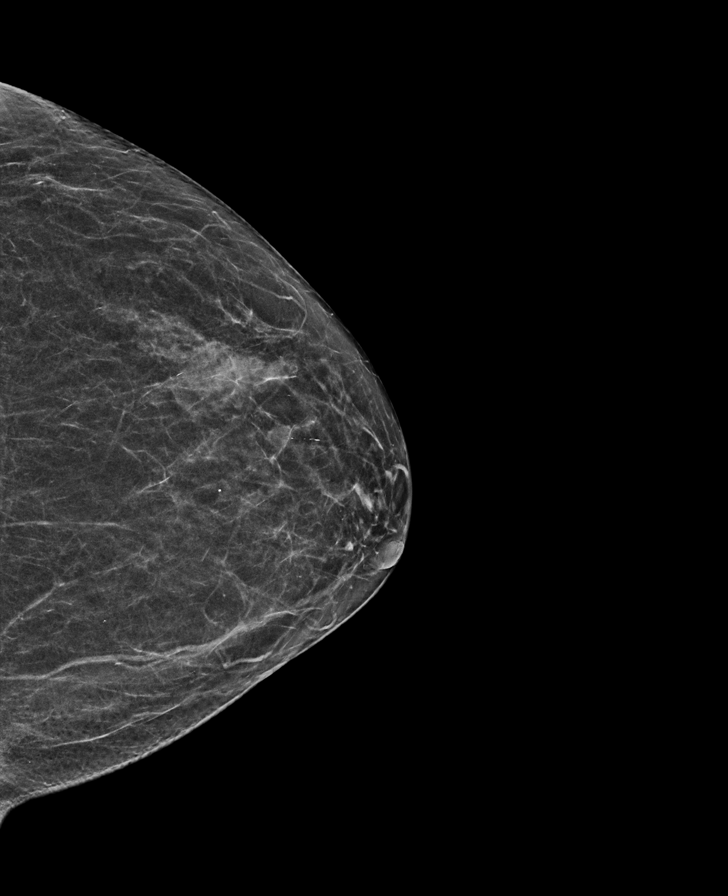

[L CC tomo · tomo slice 27/54.0]
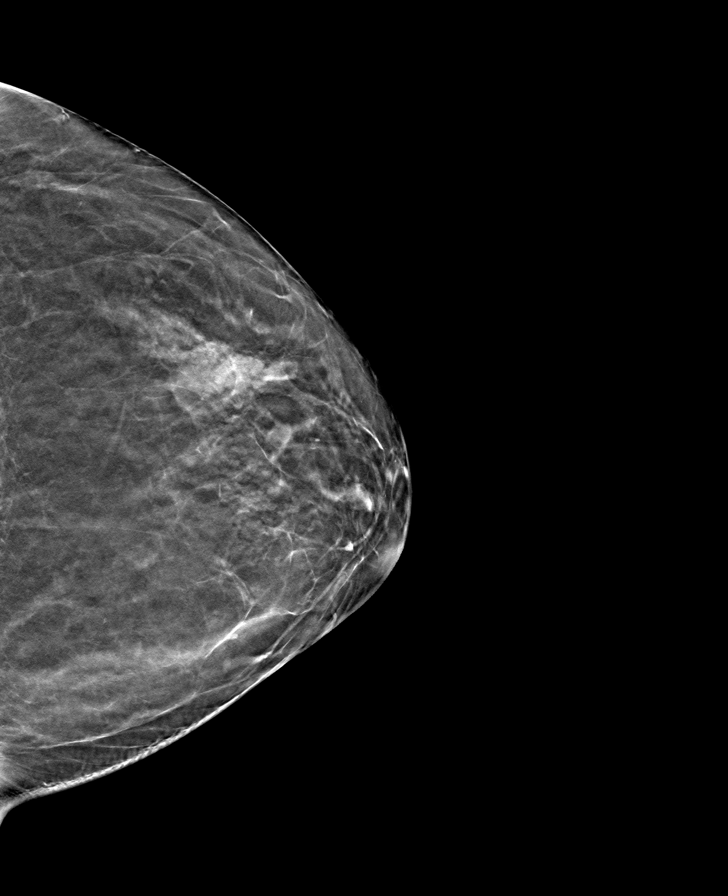

[L MLO tomo · tomo slice 33/64.0]
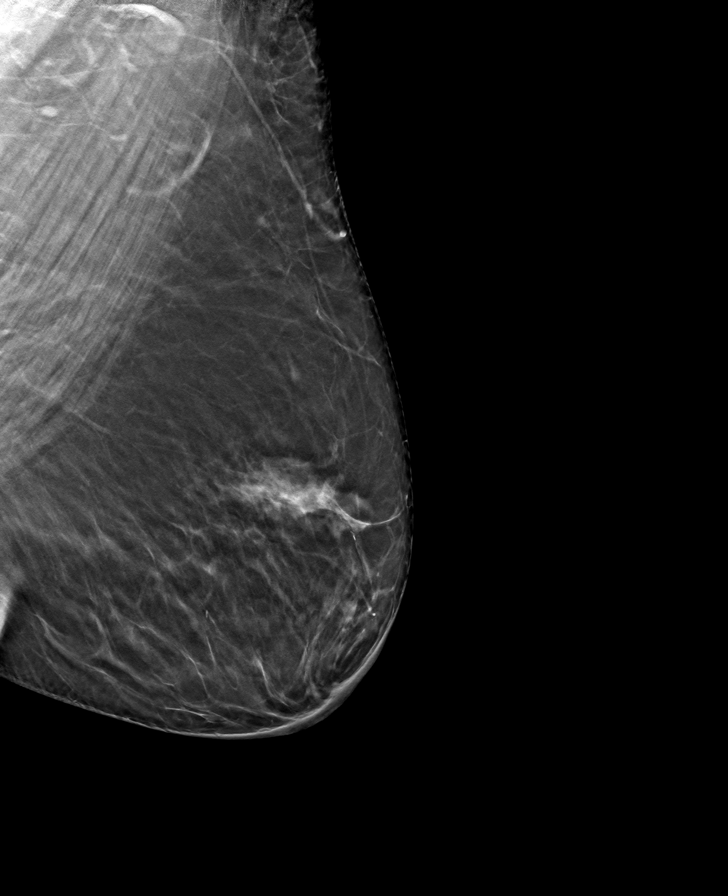

[R MLO tomo · tomo slice 33/65.0]
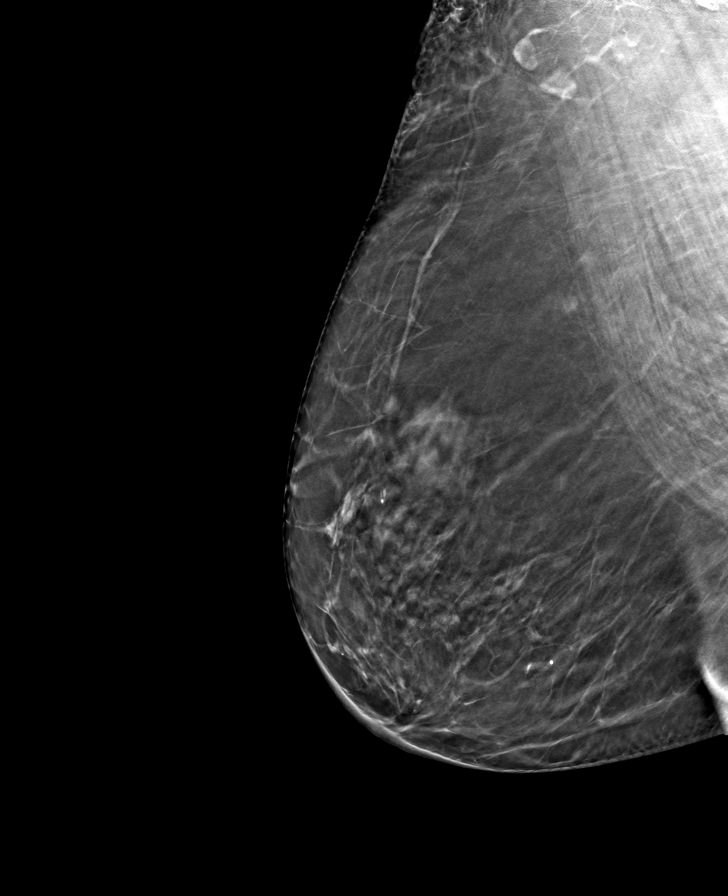

[R CC tomo · tomo slice 29/57.0]
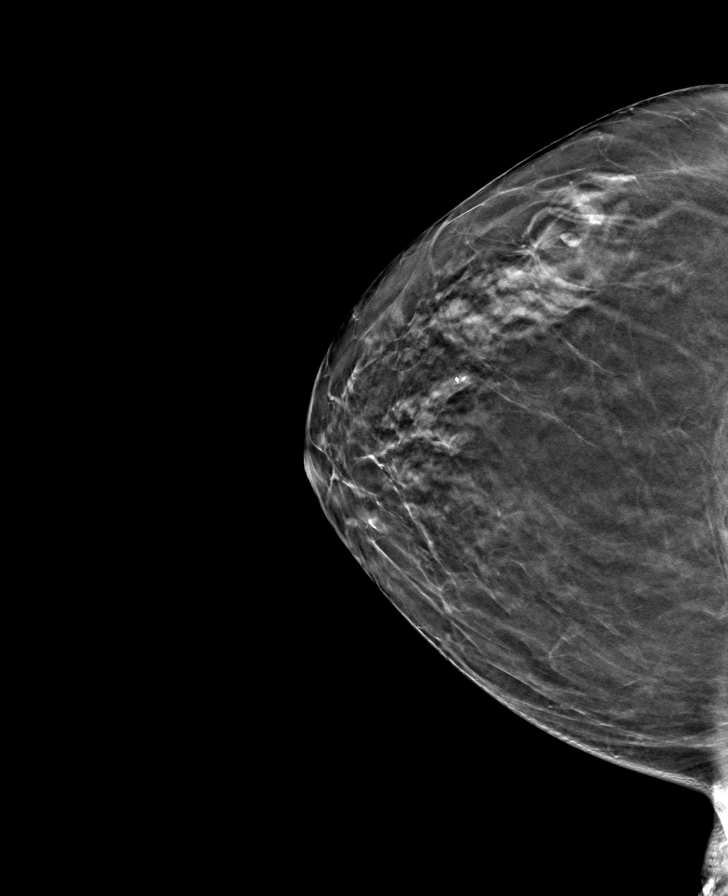

[8 of 24 positions shown; findings below may reference images not displayed]

ACR Breast Density Category b: There are scattered areas of
fibroglandular density.
FINDINGS: In the right breast, possible distortion warrants further
evaluation. In the left breast, no findings suspicious for
malignancy. Images were processed with CAD.
IMPRESSION: Further evaluation is suggested for possible distortion in the right
breast.

RECOMMENDATION:
Diagnostic mammogram and possibly ultrasound of the right breast.
(Code:[N9])

The patient will be contacted regarding the findings, and additional
imaging will be scheduled.

BI-RADS CATEGORY  0: Incomplete. Need additional imaging evaluation
and/or prior mammograms for comparison.

## 2020-11-09 ENCOUNTER — Other Ambulatory Visit: Payer: Self-pay | Admitting: Family Medicine

## 2020-11-09 DIAGNOSIS — R928 Other abnormal and inconclusive findings on diagnostic imaging of breast: Secondary | ICD-10-CM

## 2020-11-16 ENCOUNTER — Other Ambulatory Visit: Payer: Self-pay

## 2020-11-16 ENCOUNTER — Ambulatory Visit
Admission: RE | Admit: 2020-11-16 | Discharge: 2020-11-16 | Disposition: A | Discharge status: Home / Self Care | Payer: Medicare HMO | Source: Ambulatory Visit | Attending: Family Medicine | Admitting: Family Medicine

## 2020-11-16 ENCOUNTER — Ambulatory Visit: Payer: Medicare HMO

## 2020-11-16 DIAGNOSIS — R921 Mammographic calcification found on diagnostic imaging of breast: Secondary | ICD-10-CM | POA: Diagnosis not present

## 2020-11-16 DIAGNOSIS — R928 Other abnormal and inconclusive findings on diagnostic imaging of breast: Secondary | ICD-10-CM

## 2020-11-16 IMAGING — MG MM DIGITAL DIAGNOSTIC UNILAT*R* W/ TOMO W/ CAD
8 series · 8 of 24 positions shown · non-contrast
Comparison: Previous exam(s).

CLINICAL DATA: Recall from screening mammography with
tomosynthesis, possible subtle architectural distortion involving
the OUTER RIGHT breast at MIDDLE to POSTERIOR depth visible only on
the CC images.

EXAM:
DIGITAL DIAGNOSTIC UNILATERAL RIGHT MAMMOGRAM WITH TOMO AND CAD

[R CC synth-2D (1 of 3)]
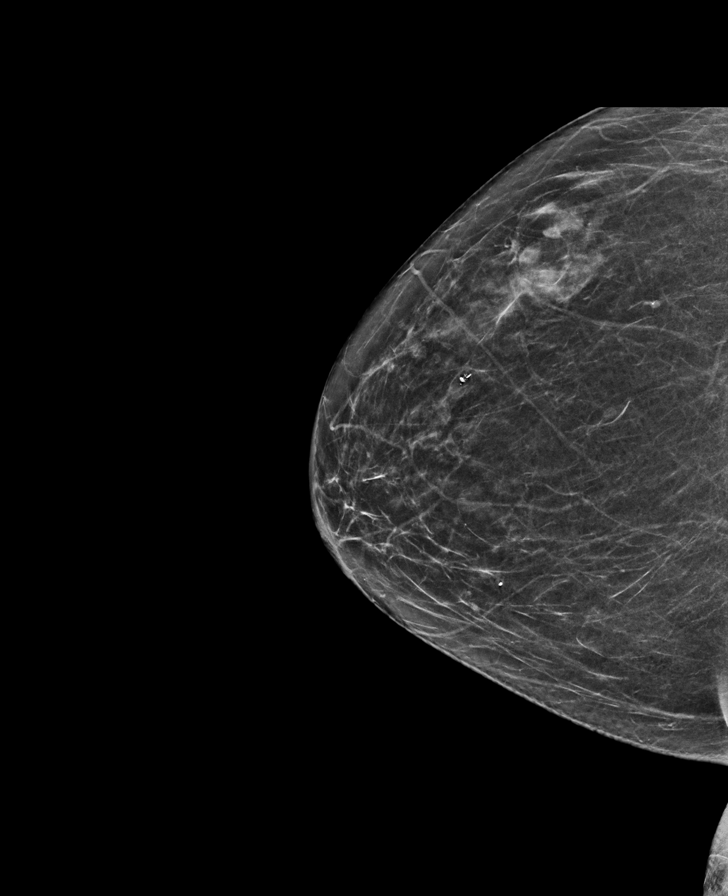

[R CC synth-2D (2 of 3)]
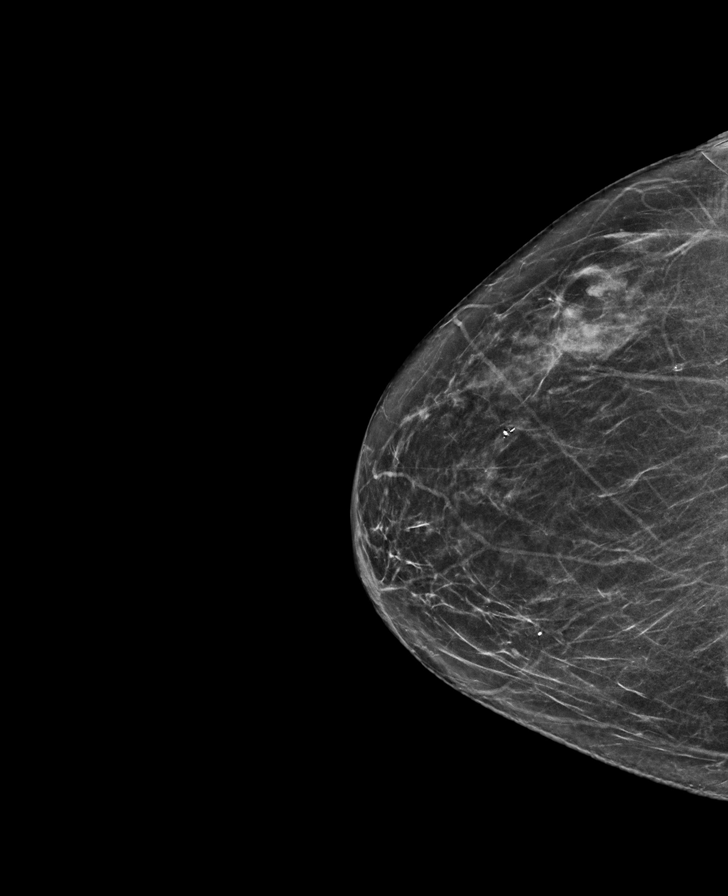

[R CC synth-2D (3 of 3)]
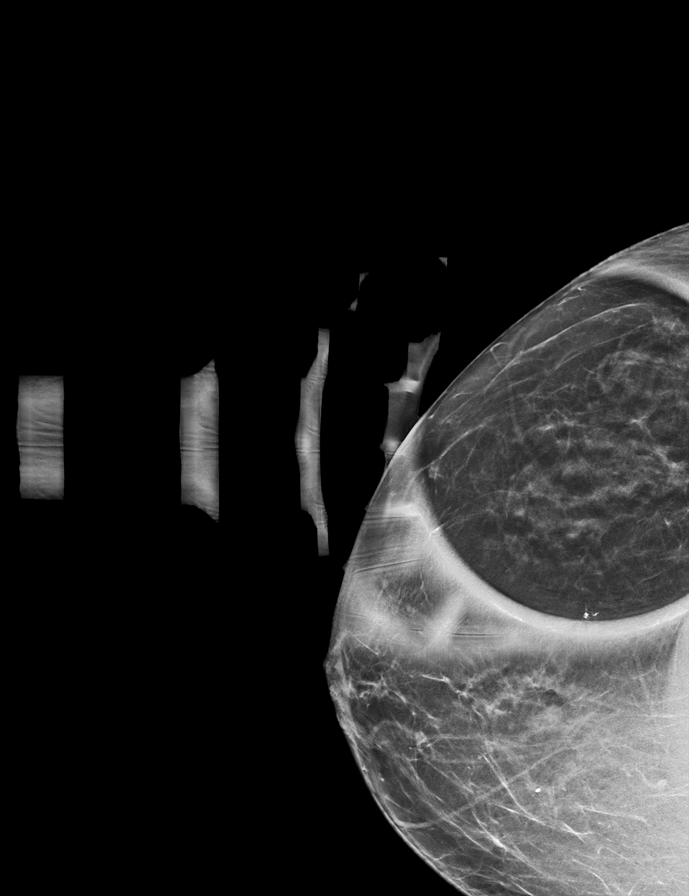

[R ML synth-2D]
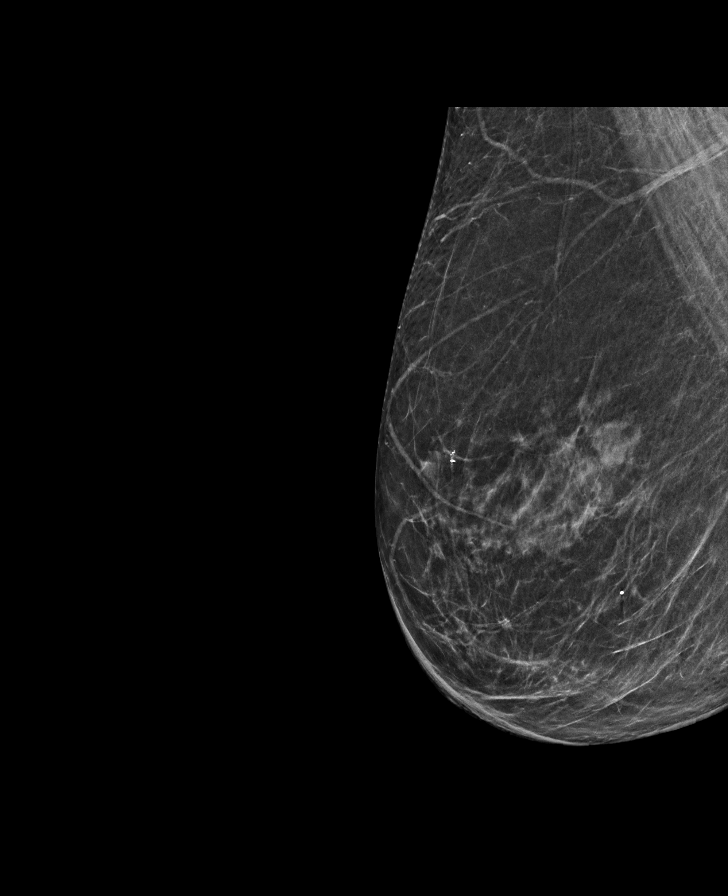

[R CC tomo (1 of 3) · tomo slice 25/48.0]
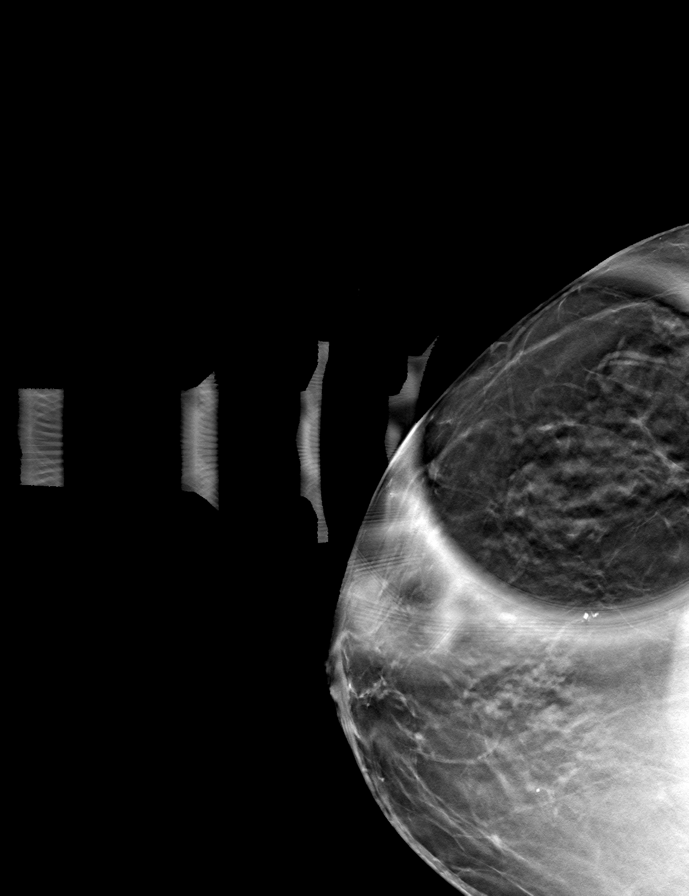

[R CC tomo (2 of 3) · tomo slice 33/64.0]
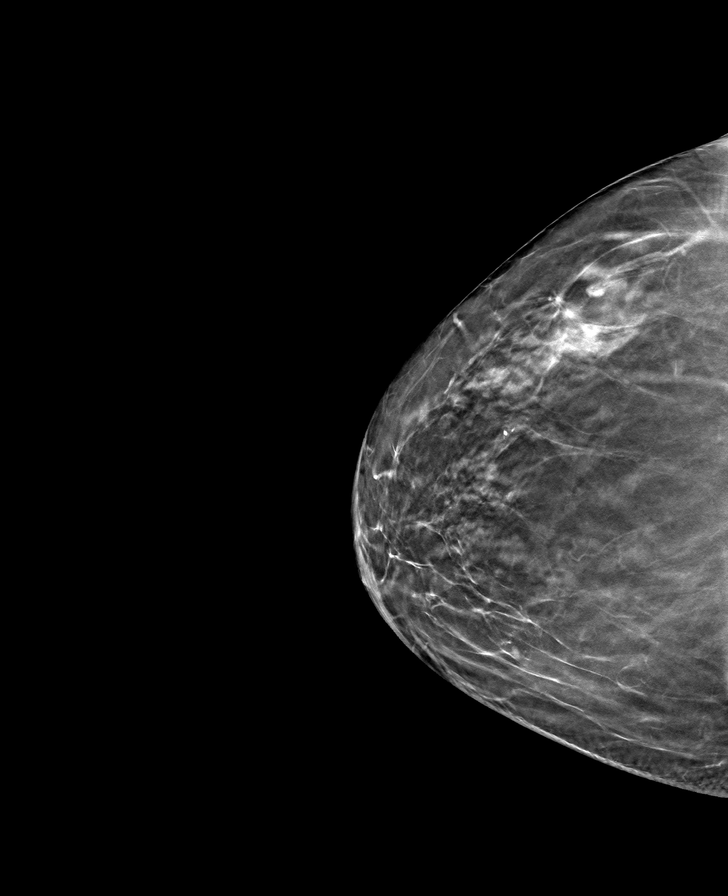

[R CC tomo (3 of 3) · tomo slice 31/61.0]
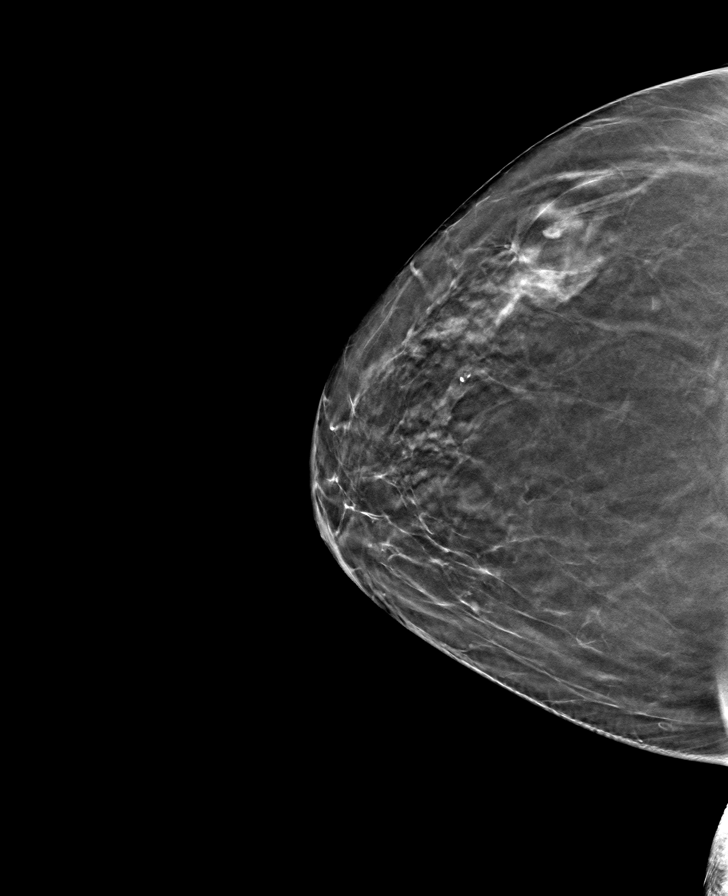

[R ML tomo · tomo slice 31/62.0]
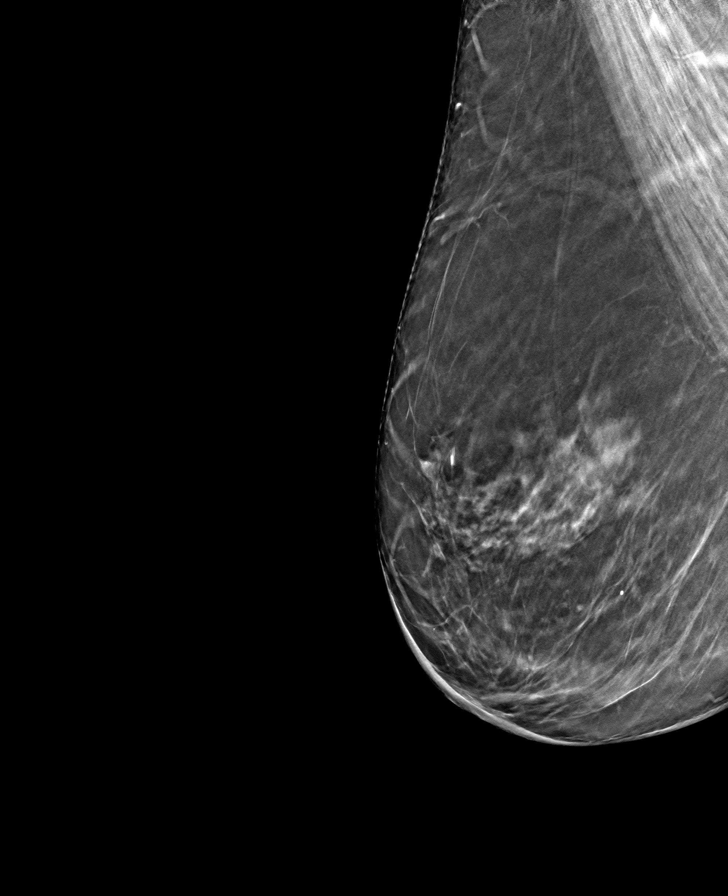

[8 of 24 positions shown; findings below may reference images not displayed]

ACR Breast Density Category b: There are scattered areas of
fibroglandular density.
FINDINGS: Tomosynthesis and synthesized spot compression CC view of the area
of concern in the RIGHT breast and tomosynthesis and synthesized
full field mediolateral and laterally rolled CC views the RIGHT
breast were obtained. Mammographic images were processed with CAD.

No persistent architectural distortion in the OUTER breast at MIDDLE
to POSTERIOR depth on the spot compression images. The screening
mammographic finding is due to overlapping Cooper's ligaments and
overlapping vessels in this location. There is no underlying mass or
suspicious calcifications.

No findings suspicious for malignancy in the RIGHT breast.
IMPRESSION: No mammographic evidence of malignancy involving the RIGHT breast.

RECOMMENDATION:
Screening mammogram in one year.(Code:[SF])

I have discussed the findings and recommendations with the patient.
If applicable, a reminder letter will be sent to the patient
regarding the next appointment.

BI-RADS CATEGORY  1: Negative.

## 2020-11-30 ENCOUNTER — Other Ambulatory Visit: Payer: Self-pay

## 2020-11-30 ENCOUNTER — Ambulatory Visit (INDEPENDENT_AMBULATORY_CARE_PROVIDER_SITE_OTHER): Payer: Medicare HMO

## 2020-11-30 ENCOUNTER — Ambulatory Visit (INDEPENDENT_AMBULATORY_CARE_PROVIDER_SITE_OTHER): Payer: Medicare HMO | Admitting: Family Medicine

## 2020-11-30 ENCOUNTER — Other Ambulatory Visit: Payer: Self-pay | Admitting: Family Medicine

## 2020-11-30 ENCOUNTER — Encounter: Payer: Self-pay | Admitting: Family Medicine

## 2020-11-30 VITALS — BP 123/61 | HR 69 | Temp 97.3°F | Resp 20 | Ht 66.0 in | Wt 165.0 lb

## 2020-11-30 DIAGNOSIS — Z720 Tobacco use: Secondary | ICD-10-CM | POA: Diagnosis not present

## 2020-11-30 DIAGNOSIS — M25551 Pain in right hip: Secondary | ICD-10-CM | POA: Diagnosis not present

## 2020-11-30 DIAGNOSIS — E782 Mixed hyperlipidemia: Secondary | ICD-10-CM | POA: Diagnosis not present

## 2020-11-30 DIAGNOSIS — M8588 Other specified disorders of bone density and structure, other site: Secondary | ICD-10-CM

## 2020-11-30 DIAGNOSIS — M85852 Other specified disorders of bone density and structure, left thigh: Secondary | ICD-10-CM | POA: Diagnosis not present

## 2020-11-30 DIAGNOSIS — M8589 Other specified disorders of bone density and structure, multiple sites: Secondary | ICD-10-CM

## 2020-11-30 DIAGNOSIS — M25559 Pain in unspecified hip: Secondary | ICD-10-CM

## 2020-11-30 DIAGNOSIS — K5909 Other constipation: Secondary | ICD-10-CM | POA: Diagnosis not present

## 2020-11-30 DIAGNOSIS — Z780 Asymptomatic menopausal state: Secondary | ICD-10-CM | POA: Diagnosis not present

## 2020-11-30 DIAGNOSIS — I10 Essential (primary) hypertension: Secondary | ICD-10-CM | POA: Diagnosis not present

## 2020-11-30 DIAGNOSIS — Z0000 Encounter for general adult medical examination without abnormal findings: Secondary | ICD-10-CM

## 2020-11-30 DIAGNOSIS — Z0001 Encounter for general adult medical examination with abnormal findings: Secondary | ICD-10-CM

## 2020-11-30 DIAGNOSIS — M545 Low back pain, unspecified: Secondary | ICD-10-CM | POA: Diagnosis not present

## 2020-11-30 IMAGING — DX DG LUMBAR SPINE 2-3V
2 series · 2 of 2 positions shown · non-contrast
Comparison: Plain films lumbar spine [DATE].

CLINICAL DATA: Low back pain radiating to the right groin.

EXAM:
LUMBAR SPINE - 2-3 VIEW

[l-spine ap]
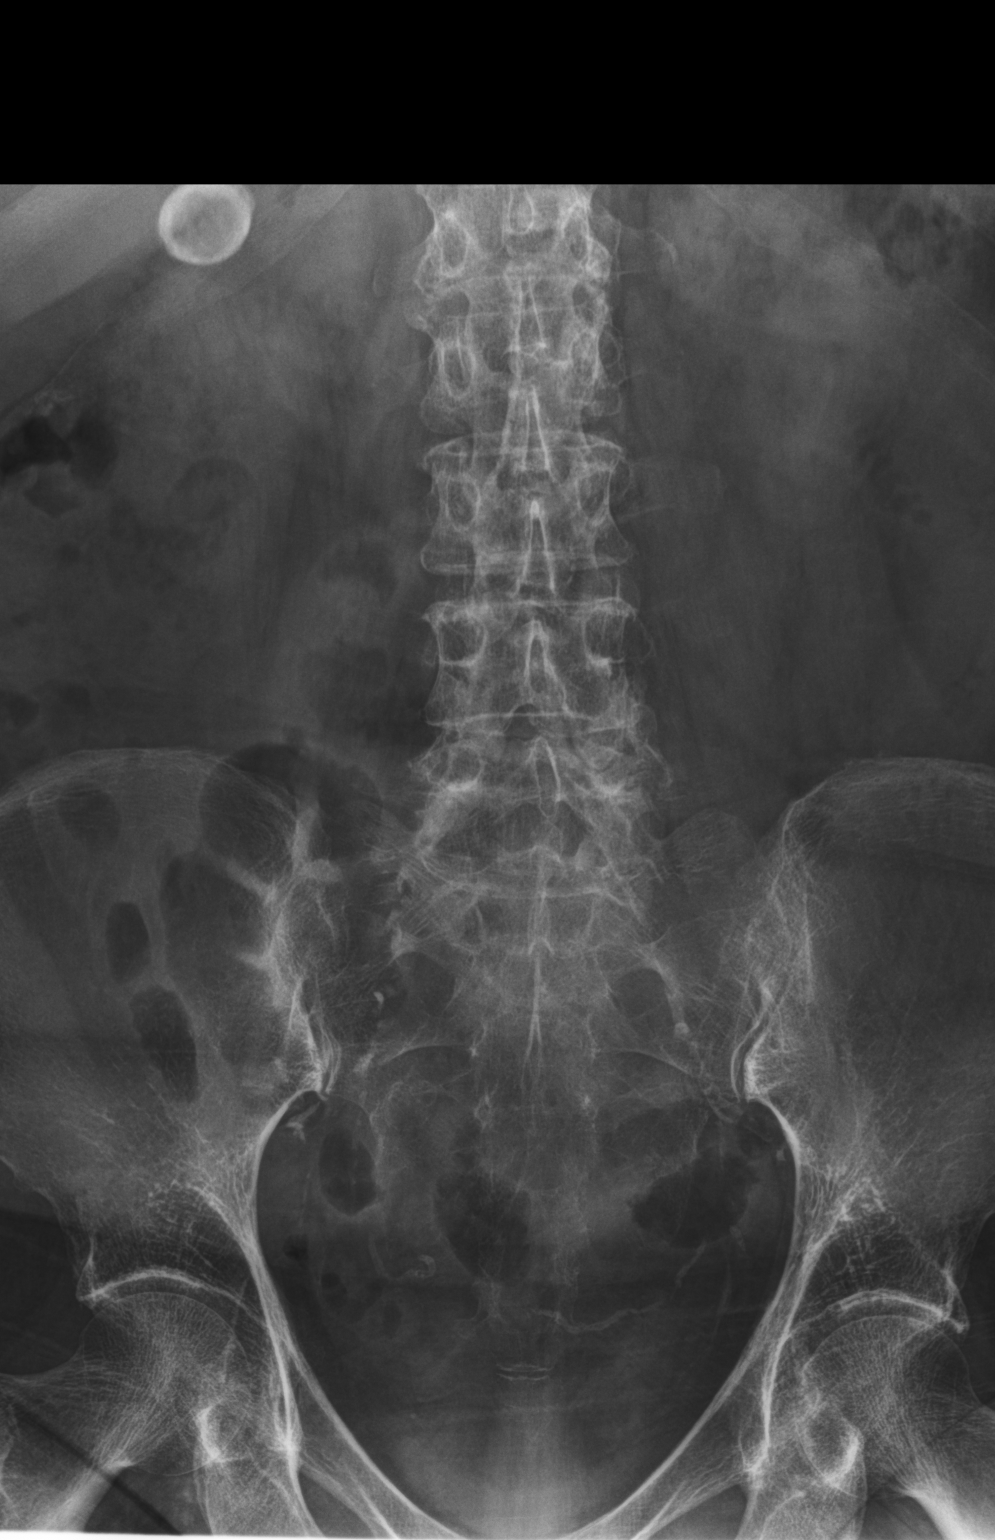

[l-spine lat]
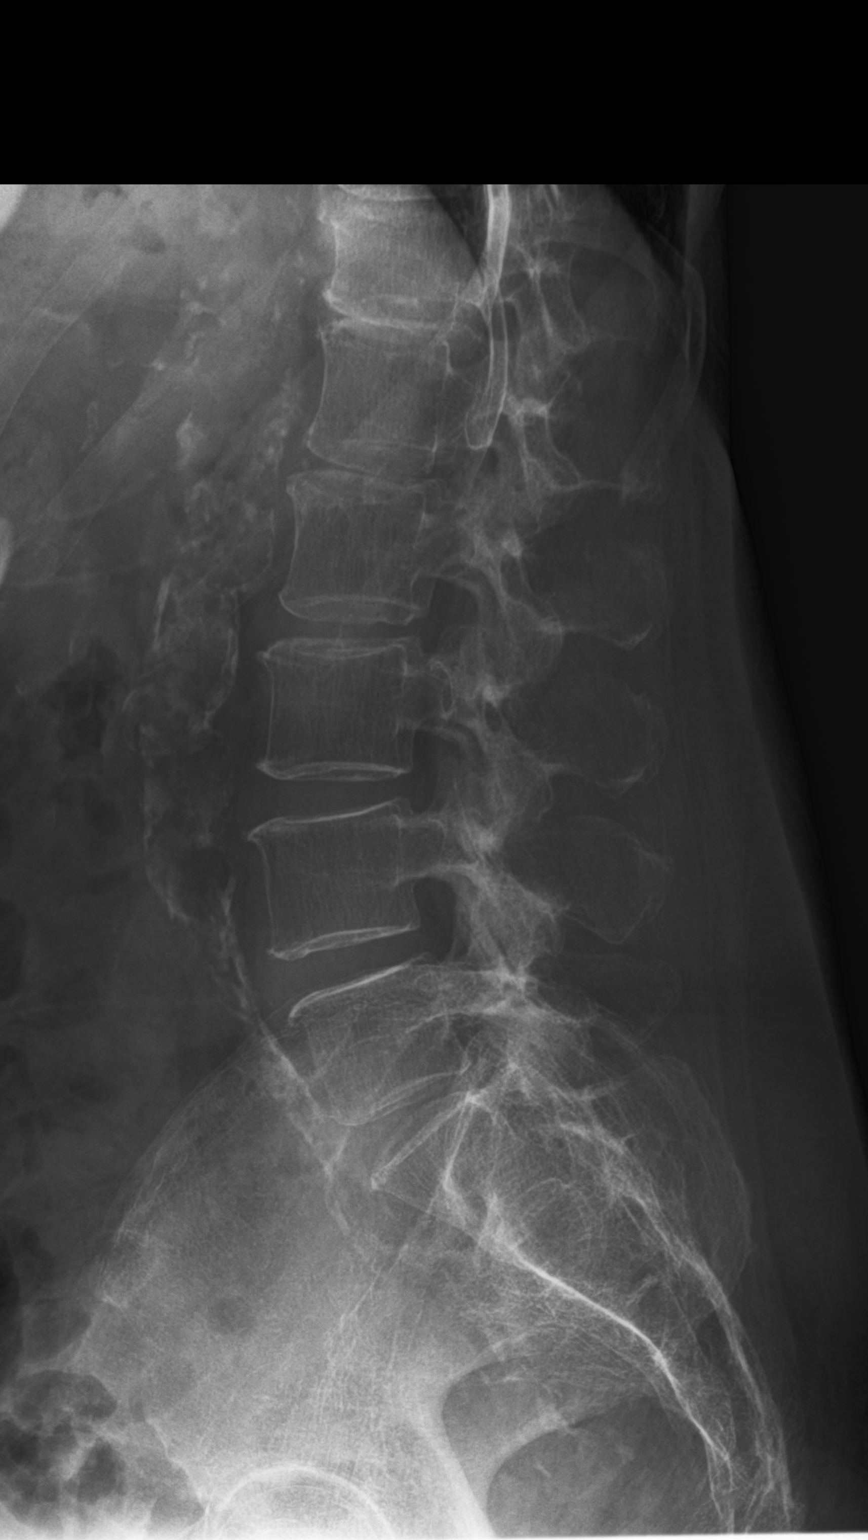

[2 of 2 positions shown; findings below may reference images not displayed]

FINDINGS: Vertebral body height and alignment are maintained. Intervertebral
disc space height is maintained in the lumbar spine. No focal bony
lesion. Paraspinous structures demonstrate a 2.5 cm gallstone in
atherosclerosis.
IMPRESSION: Normal appearing lumbar spine.

Gallstone, unchanged.

Aortic Atherosclerosis ([TU]-[TU]).

## 2020-11-30 IMAGING — DX DG HIP (WITH OR WITHOUT PELVIS) 2-3V*R*
3 series · 3 of 3 positions shown · non-contrast
Comparison: None.

CLINICAL DATA: Right groin pain.

EXAM:
DG HIP (WITH OR WITHOUT PELVIS) 2-3V RIGHT

[hip ap]
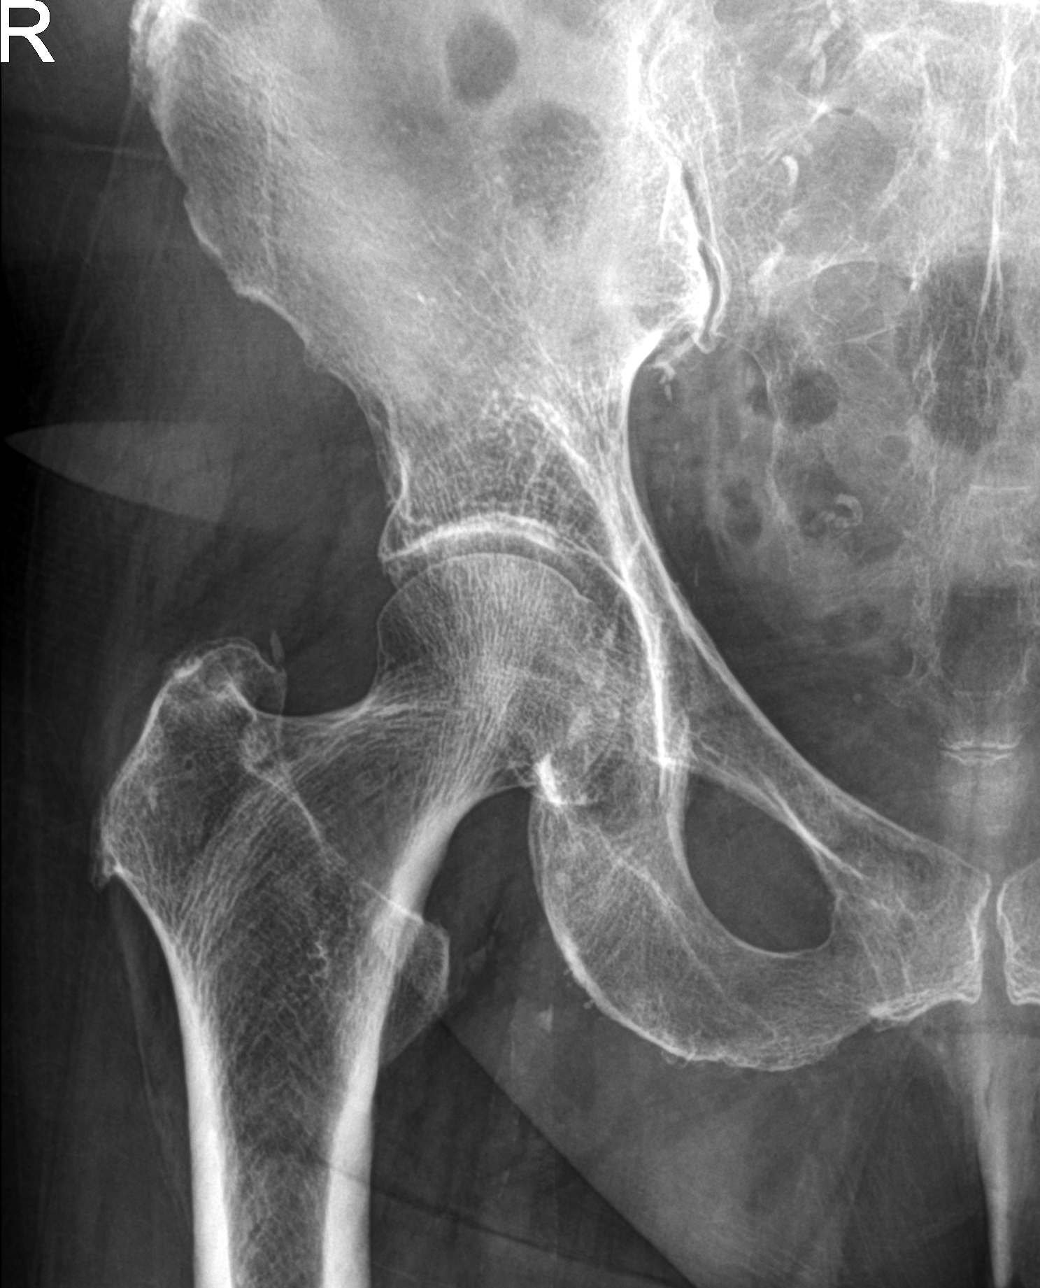

[hip lat]
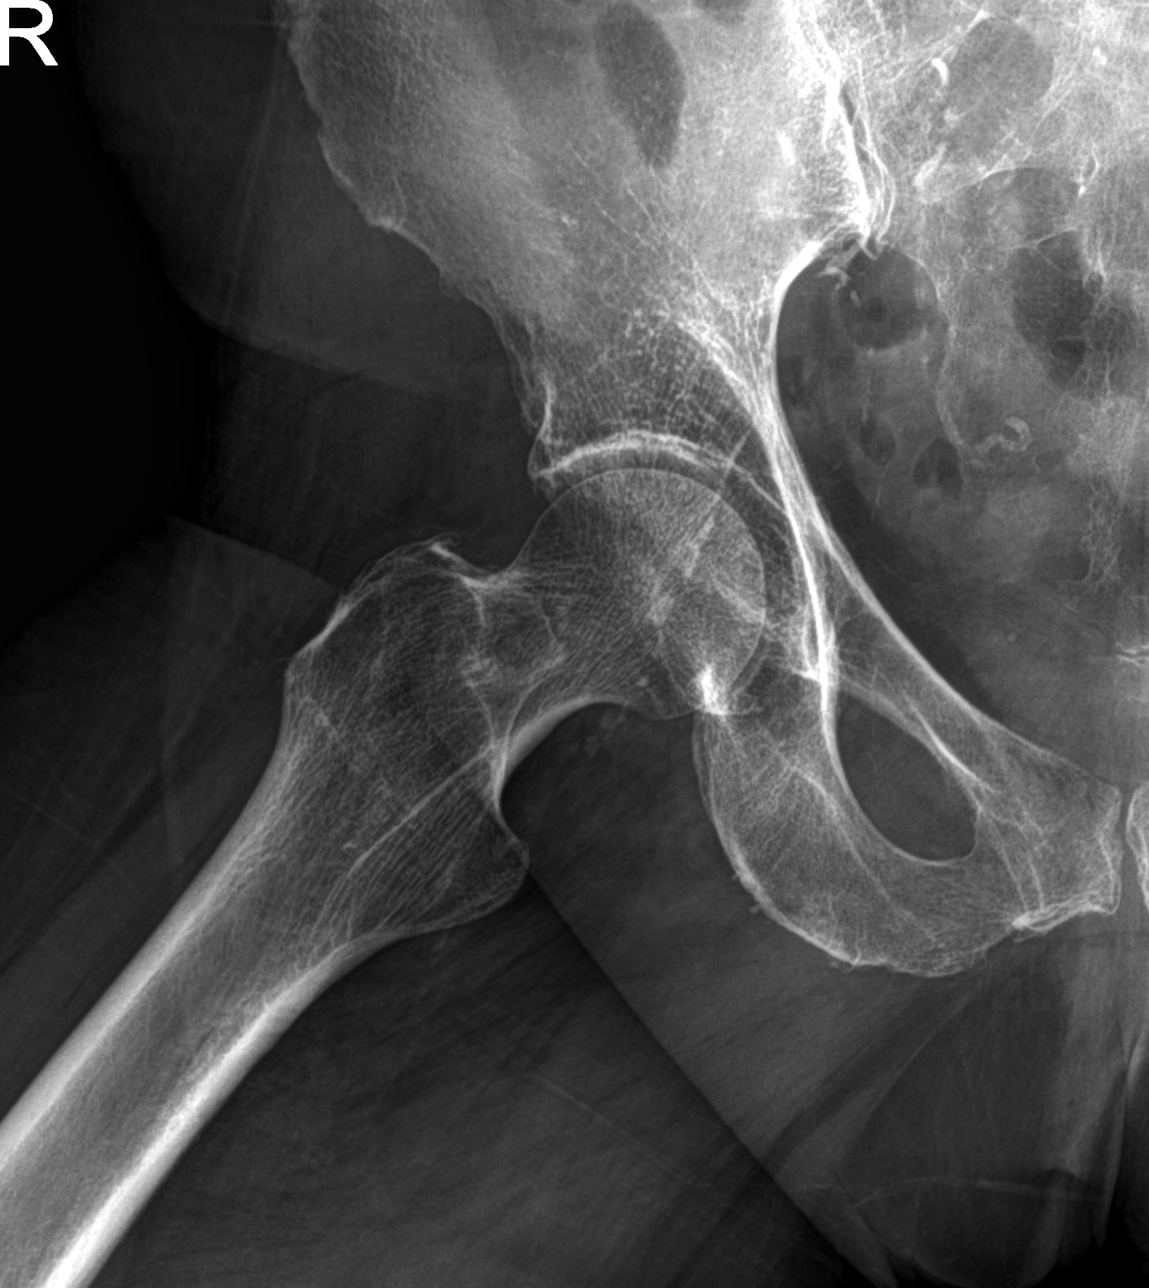

[pelvis ap]
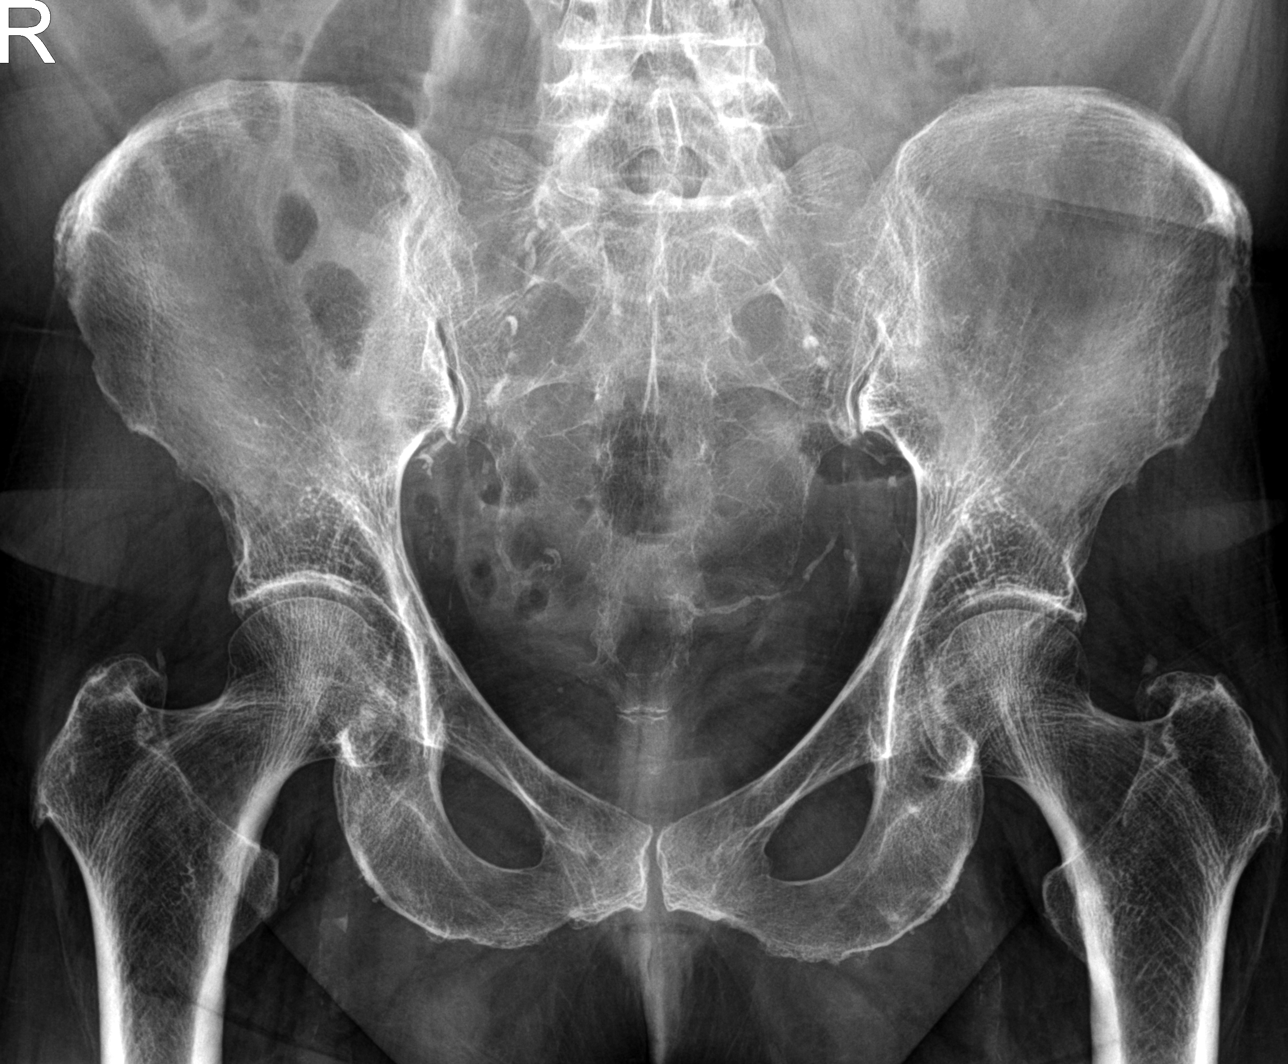

[3 of 3 positions shown; findings below may reference images not displayed]

FINDINGS: There is no acute or focal bony or joint abnormality. Right hip
joint space is preserved. Soft tissues demonstrate atherosclerosis.
IMPRESSION: Normal-appearing right hip.

Atherosclerosis.

## 2020-11-30 MED ORDER — SIMVASTATIN 20 MG PO TABS: 20.0000 mg | ORAL_TABLET | Freq: Every day | ORAL | 3 refills | Status: DC

## 2020-11-30 MED ORDER — LOSARTAN POTASSIUM 50 MG PO TABS: 50.0000 mg | ORAL_TABLET | Freq: Every day | ORAL | 3 refills | Status: DC

## 2020-11-30 NOTE — Progress Notes (Signed)
Elizabeth Wu is a 70 y.o. female presents to office today for annual physical exam examination.    Concerns today include: 1.  Low back pain/right hip pain Patient reports about a 2 to 3-week history of insidious onset of right hip pain.  Sometimes will start from her mid to low back and radiate to her groin down her right lower extremity.  She describes it as sharp and lasting anywhere between 10 to 15 seconds.  It can occur to 3-4 times daily.  Denies any preceding injury, change in activity.  No associated numbness, tingling, weakness.  No known history of renal stones.  Denies any urinary symptoms including dysuria, increased urinary frequency or hematuria.  Substance use: Tobacco use.  Smokes 1/2 pack/day. Diet: Fair, Exercise: No structured Last eye exam: Up-to-date.  Wears glasses Last colonoscopy: Up-to-date Last mammogram: Up-to-date Last pap smear: N/A Refills needed today: None Immunizations needed: Tetanus and shingles.  Declines today due to cost  Past Medical History:  Diagnosis Date  . Allergy   . DJD (degenerative joint disease)   . Hyperlipidemia   . Osteopenia    Social History   Socioeconomic History  . Marital status: Single    Spouse name: Not on file  . Number of children: Not on file  . Years of education: Not on file  . Highest education level: 12th grade  Occupational History  . Occupation: Quality Control  Tobacco Use  . Smoking status: Current Every Day Smoker    Packs/day: 0.50    Years: 40.00    Pack years: 20.00    Types: Cigarettes  . Smokeless tobacco: Never Used  Vaping Use  . Vaping Use: Never used  Substance and Sexual Activity  . Alcohol use: No  . Drug use: No  . Sexual activity: Not on file  Other Topics Concern  . Not on file  Social History Narrative  . Not on file   Social Determinants of Health   Financial Resource Strain: Not on file  Food Insecurity: Not on file  Transportation Needs: Not on file  Physical  Activity: Not on file  Stress: Not on file  Social Connections: Not on file  Intimate Partner Violence: Not on file   Past Surgical History:  Procedure Laterality Date  . APPENDECTOMY    . HERNIA REPAIR    . TONSILLECTOMY    . TONSILLECTOMY     Family History  Problem Relation Age of Onset  . Alzheimer's disease Mother   . Cancer Father        stomach cancer  . Diabetes Sister   . Heart disease Brother        MI    Current Outpatient Medications:  .  ibuprofen (ADVIL,MOTRIN) 200 MG tablet, Take 200 mg by mouth every 6 (six) hours as needed., Disp: , Rfl:  .  losartan (COZAAR) 50 MG tablet, Take 1 tablet (50 mg total) by mouth daily., Disp: 90 tablet, Rfl: 3 .  ondansetron (ZOFRAN) 4 MG tablet, Take 1 tablet (4 mg total) by mouth every 8 (eight) hours as needed for nausea or vomiting., Disp: 30 tablet, Rfl: 1 .  Plecanatide (TRULANCE) 3 MG TABS, Take 1 tablet by mouth daily., Disp: 30 tablet, Rfl: 5 .  simvastatin (ZOCOR) 20 MG tablet, Take 1 tablet (20 mg total) by mouth at bedtime., Disp: 90 tablet, Rfl: 3  No Known Allergies   ROS: Review of Systems Pertinent items noted in HPI and remainder of comprehensive ROS otherwise  negative.    Physical exam BP 123/61   Pulse 69   Temp (!) 97.3 F (36.3 C)   Resp 20   Ht 5\' 6"  (1.676 m)   Wt 165 lb (74.8 kg)   SpO2 97%   BMI 26.63 kg/m  General appearance: alert, cooperative, appears stated age and no distress Head: Normocephalic, without obvious abnormality, atraumatic Eyes: negative findings: lids and lashes normal, conjunctivae and sclerae normal, corneas clear and pupils equal, round, reactive to light and accomodation Ears: normal TM's and external ear canals both ears Nose: Nares normal. Septum midline. Mucosa normal. No drainage or sinus tenderness. Throat: Missing teeth.  Moist mucous membranes.  Oropharynx clear.  No sublingual masses Neck: no adenopathy, no carotid bruit, supple, symmetrical, trachea midline and  thyroid not enlarged, symmetric, no tenderness/mass/nodules Back: symmetric, no curvature. ROM normal. No CVA tenderness. Lungs: clear to auscultation bilaterally Heart: regular rate and rhythm, S1, S2 normal, no murmur, click, rub or gallop Abdomen: soft, non-tender; bowel sounds normal; no masses,  no organomegaly Extremities: extremities normal, atraumatic, no cyanosis or edema Pulses: 2+ and symmetric Skin: Sebaceous cyst noted along the right upper back.  No evidence of secondary infection.  No tenderness to palpation.  Multiple pigmented nevi also noted Lymph nodes: Cervical, supraclavicular, and axillary nodes normal. Neurologic: Alert and oriented X 3, normal strength and tone. Normal symmetric reflexes. Normal coordination and gait Psych: Mood stable, speech normal, affect appropriate MSK: Negative straight leg raise.  She has a negative FABER but positive pain with FADIR on right.  No tenderness to palpation to the greater trochanteric bursa   Assessment/ Plan: Elizabeth Wu here for annual physical exam.   Annual physical exam  Mixed hyperlipidemia - Plan: Lipid Panel  Essential hypertension - Plan: Basic Metabolic Panel  Osteopenia of multiple sites - Plan: VITAMIN D 25 Hydroxy (Vit-D Deficiency, Fractures), DG [facility] DEXA  Chronic constipation  Hip pain - Plan: DG Lumbar Spine 2-3 Views, DG Hip Unilat W OR W/O Pelvis 2-3 Views Right  Tobacco use  Check fasting labs.  Blood pressures well controlled.  Continue current regimen.  Plan for updated DEXA scan.  Check vitamin D, calcium  Chronic constipation is stable with as needed use of Linzess and OTC medications.  This also has been costly and therefore she has not gotten to use the Linzess daily  As far as her hip pain goes, difficult to tell if this is from her back or from her hip.  Her exam was notable for positive pain with internal rotation and adduction of the right hip.  Negative for straight leg raise.  We will  contact her once results of x-ray have been formally read by radiology.  Additionally, she is deferring vaccines today due to cost.  She is contemplative with smoking cessation.  Counseled on healthy lifestyle choices, including diet (rich in fruits, vegetables and lean meats and low in salt and simple carbohydrates) and exercise (at least 30 minutes of moderate physical activity daily).  Patient to follow up in 1 year for annual exam or sooner if needed.  Lilyanna Lunt M. Lajuana Ripple, DO

## 2020-11-30 NOTE — Patient Instructions (Signed)
Preventive Care 70 Years and Older, Female Preventive care refers to lifestyle choices and visits with your health care provider that can promote health and wellness. This includes:  A yearly physical exam. This is also called an annual wellness visit.  Regular dental and eye exams.  Immunizations.  Screening for certain conditions.  Healthy lifestyle choices, such as: ? Eating a healthy diet. ? Getting regular exercise. ? Not using drugs or products that contain nicotine and tobacco. ? Limiting alcohol use. What can I expect for my preventive care visit? Physical exam Your health care provider will check your:  Height and weight. These may be used to calculate your BMI (body mass index). BMI is a measurement that tells if you are at a healthy weight.  Heart rate and blood pressure.  Body temperature.  Skin for abnormal spots. Counseling Your health care provider may ask you questions about your:  Past medical problems.  Family's medical history.  Alcohol, tobacco, and drug use.  Emotional well-being.  Home life and relationship well-being.  Sexual activity.  Diet, exercise, and sleep habits.  History of falls.  Memory and ability to understand (cognition).  Work and work Statistician.  Pregnancy and menstrual history.  Access to firearms. What immunizations do I need? Vaccines are usually given at various ages, according to a schedule. Your health care provider will recommend vaccines for you based on your age, medical history, and lifestyle or other factors, such as travel or where you work.   What tests do I need? Blood tests  Lipid and cholesterol levels. These may be checked every 5 years, or more often depending on your overall health.  Hepatitis C test.  Hepatitis B test. Screening  Lung cancer screening. You may have this screening every year starting at age 70 if you have a 30-pack-year history of smoking and currently smoke or have quit within  the past 15 years.  Colorectal cancer screening. ? All adults should have this screening starting at age 70 and continuing until age 70. ? Your health care provider may recommend screening at age 2 if you are at increased risk. ? You will have tests every 1-10 years, depending on your results and the type of screening test.  Diabetes screening. ? This is done by checking your blood sugar (glucose) after you have not eaten for a while (fasting). ? You may have this done every 1-3 years.  Mammogram. ? This may be done every 1-2 years. ? Talk with your health care provider about how often you should have regular mammograms.  Abdominal aortic aneurysm (AAA) screening. You may need this if you are a current or former smoker.  BRCA-related cancer screening. This may be done if you have a family history of breast, ovarian, tubal, or peritoneal cancers. Other tests  STD (sexually transmitted disease) testing, if you are at risk.  Bone density scan. This is done to screen for osteoporosis. You may have this done starting at age 70. Talk with your health care provider about your test results, treatment options, and if necessary, the need for more tests. Follow these instructions at home: Eating and drinking  Eat a diet that includes fresh fruits and vegetables, whole grains, lean protein, and low-fat dairy products. Limit your intake of foods with high amounts of sugar, saturated fats, and salt.  Take vitamin and mineral supplements as recommended by your health care provider.  Do not drink alcohol if your health care provider tells you not to drink.  If you drink alcohol: ? Limit how much you have to 0-1 drink a day. ? Be aware of how much alcohol is in your drink. In the [place] one drink equals one 12 oz bottle of beer (355 mL), one 5 oz glass of wine (148 mL), or one 1 oz glass of hard liquor (44 mL).   Lifestyle  Take daily care of your teeth and gums. Brush your teeth every morning  and night with fluoride toothpaste. Floss one time each day.  Stay active. Exercise for at least 30 minutes 5 or more days each week.  Do not use any products that contain nicotine or tobacco, such as cigarettes, e-cigarettes, and chewing tobacco. If you need help quitting, ask your health care provider.  Do not use drugs.  If you are sexually active, practice safe sex. Use a condom or other form of protection in order to prevent STIs (sexually transmitted infections).  Talk with your health care provider about taking a low-dose aspirin or statin.  Find healthy ways to cope with stress, such as: ? Meditation, yoga, or listening to music. ? Journaling. ? Talking to a trusted person. ? Spending time with friends and family. Safety  Always wear your seat belt while driving or riding in a vehicle.  Do not drive: ? If you have been drinking alcohol. Do not ride with someone who has been drinking. ? When you are tired or distracted. ? While texting.  Wear a helmet and other protective equipment during sports activities.  If you have firearms in your house, make sure you follow all gun safety procedures. What's next?  Visit your health care provider once a year for an annual wellness visit.  Ask your health care provider how often you should have your eyes and teeth checked.  Stay up to date on all vaccines. This information is not intended to replace advice given to you by your health care provider. Make sure you discuss any questions you have with your health care provider. Document Revised: 10/28/2020 Document Reviewed: 11/01/2018 Elsevier Patient Education  2021 [employer]  

## 2020-12-01 ENCOUNTER — Other Ambulatory Visit: Payer: Self-pay

## 2020-12-01 ENCOUNTER — Other Ambulatory Visit: Payer: Self-pay | Admitting: Family Medicine

## 2020-12-01 LAB — LIPID PANEL
Chol/HDL Ratio: 4.4 ratio (ref 0.0–4.4)
Cholesterol, Total: 193 mg/dL (ref 100–199)
HDL: 44 mg/dL (ref >39)
LDL Chol Calc (NIH): 116 mg/dL — ABNORMAL HIGH (ref 0–99)
Triglycerides: 186 mg/dL — ABNORMAL HIGH (ref 0–149)
VLDL Cholesterol Cal: 33 mg/dL (ref 5–40)

## 2020-12-01 LAB — VITAMIN D 25 HYDROXY (VIT D DEFICIENCY, FRACTURES): Vit D, 25-Hydroxy: 15.6 ng/mL — ABNORMAL LOW (ref 30.0–100.0)

## 2020-12-01 LAB — BASIC METABOLIC PANEL
BUN/Creatinine Ratio: 18 (ref 12–28)
BUN: 15 mg/dL (ref 8–27)
CO2: 25 mmol/L (ref 20–29)
Calcium: 9.4 mg/dL (ref 8.7–10.3)
Chloride: 102 mmol/L (ref 96–106)
Creatinine, Ser: 0.85 mg/dL (ref 0.57–1.00)
GFR calc Af Amer: 81 mL/min/{1.73_m2} (ref >59)
GFR calc non Af Amer: 70 mL/min/{1.73_m2} (ref >59)
Glucose: 93 mg/dL (ref 65–99)
Potassium: 4.2 mmol/L (ref 3.5–5.2)
Sodium: 139 mmol/L (ref 134–144)

## 2020-12-01 MED ORDER — CHOLECALCIFEROL 1.25 MG (50000 UT) PO CAPS: 50000.0000 [IU] | ORAL_CAPSULE | ORAL | 0 refills | Status: AC

## 2020-12-01 MED ORDER — VITAMIN D (ERGOCALCIFEROL) 1.25 MG (50000 UNIT) PO CAPS: 50000.0000 [IU] | ORAL_CAPSULE | ORAL | 0 refills | Status: DC

## 2020-12-01 MED ORDER — ROSUVASTATIN CALCIUM 10 MG PO TABS: 10.0000 mg | ORAL_TABLET | Freq: Every day | ORAL | 2 refills | Status: DC

## 2020-12-02 ENCOUNTER — Telehealth: Payer: Self-pay

## 2020-12-02 NOTE — Telephone Encounter (Signed)
Spoke to patient about vit d and calcium supplements

## 2020-12-09 ENCOUNTER — Ambulatory Visit: Payer: Medicare HMO | Admitting: Physical Therapy

## 2021-01-07 ENCOUNTER — Ambulatory Visit: Payer: Medicare HMO

## 2021-01-11 ENCOUNTER — Telehealth: Payer: Self-pay

## 2021-01-11 NOTE — Telephone Encounter (Signed)
Pt called stating that she believes the Rosuvastatin Rx is causing her to have cramps because she has been having bad cramps since taking it.   Please advise and call patient.

## 2021-01-11 NOTE — Telephone Encounter (Signed)
Just want to make sure this is bilateral (she was having back pain radiating down her leg last visit).  If so, ok to resume zocor, we can add Vascepa or Zetia.

## 2021-01-11 NOTE — Telephone Encounter (Signed)
As long as she understands that she is assuming increased cardiovascular risk, ok to switch but I do not think this is coming from medication given unilateral nature.

## 2021-01-11 NOTE — Telephone Encounter (Signed)
Patient states that she is only having the pain in one leg and I advised that may not be the medication since she was complaining of this before and the pain is not bilateral. She still wants to change back to Zocor and see how it does. Does not want to add any additional meds right now. She states that she will call back in one week with an update.

## 2021-01-11 NOTE — Telephone Encounter (Signed)
Please review and advise.

## 2021-01-25 ENCOUNTER — Ambulatory Visit (INDEPENDENT_AMBULATORY_CARE_PROVIDER_SITE_OTHER): Payer: Medicare HMO

## 2021-01-25 DIAGNOSIS — Z0000 Encounter for general adult medical examination without abnormal findings: Secondary | ICD-10-CM | POA: Diagnosis not present

## 2021-01-25 NOTE — Progress Notes (Signed)
MEDICARE ANNUAL WELLNESS VISIT  01/25/2021  Telephone Visit Disclaimer This Medicare AWV was conducted by telephone due to national recommendations for restrictions regarding the COVID-19 Pandemic (e.g. social distancing).  I verified, using two identifiers, that I am speaking with Elizabeth Wu or their authorized healthcare agent. I discussed the limitations, risks, security, and privacy concerns of performing an evaluation and management service by telephone and the potential availability of an in-person appointment in the future. The patient expressed understanding and agreed to proceed.  Location of Patient: Home Location of Provider (nurse):  [facility]  Subjective:    Elizabeth Wu is a 70 y.o. female patient of [name], DO who had a Medicare Annual Wellness Visit today via telephone. Elizabeth Wu is Retired and lives with their partner. She has no children. She reports that she is socially active and does interact with friends/family regularly. She is minimally physically active and enjoys working crossword puzzles and playing games on her computer.  Patient Care Team: [name], DO as PCP - General (Family Medicine) Gala Romney Cristopher Estimable, MD as Consulting Physician (Gastroenterology)  Advanced Directives 01/25/2021 10/07/2019  Does Patient Have a Medical Advance Directive? No No  Would patient like information on creating a medical advance directive? No - Patient declined No - Patient declined    Hospital Utilization Over the Past 12 Months: # of hospitalizations or ER visits: 0 # of surgeries: 0  Review of Systems    Patient reports that her overall health is worse compared to last year.  She feels this way because of her hip pain.med  History obtained from chart review and the patient  Patient Reported Readings (BP, Pulse, CBG, Weight, etc) none  Pain Assessment Pain : 0-10 Pain Type: Acute pain Pain Location: Hip Pain Orientation: Right Pain Radiating  Towards: knee Pain Descriptors / Indicators: Discomfort,Stabbing Pain Onset: More than a month ago Pain Frequency: Several days a week Pain Relieving Factors: Ibuprofen  Pain Relieving Factors: Ibuprofen  Current Medications & Allergies (verified) Allergies as of 01/25/2021   No Known Allergies     Medication List       Accurate as of January 25, 2021  2:13 PM. If you have any questions, ask your nurse or doctor.        STOP taking these medications   ondansetron 4 MG tablet Commonly known as: Zofran   rosuvastatin 10 MG tablet Commonly known as: Crestor   Vitamin D (Ergocalciferol) 1.25 MG (50000 UNIT) Caps capsule Commonly known as: DRISDOL     TAKE these medications   ibuprofen 200 MG tablet Commonly known as: ADVIL Take 200 mg by mouth every 6 (six) hours as needed.   losartan 50 MG tablet Commonly known as: COZAAR Take 1 tablet (50 mg total) by mouth daily.   simvastatin 20 MG tablet Commonly known as: ZOCOR Take 1 tablet (20 mg total) by mouth at bedtime.       History (reviewed): Past Medical History:  Diagnosis Date  . Allergy   . DJD (degenerative joint disease)   . Hyperlipidemia   . Osteopenia    Past Surgical History:  Procedure Laterality Date  . APPENDECTOMY    . HERNIA REPAIR    . TONSILLECTOMY    . TONSILLECTOMY     Family History  Problem Relation Age of Onset  . Alzheimer's disease Mother   . Cancer Father        stomach cancer  . Diabetes Sister   . Heart  disease Brother        MI   Social History   Socioeconomic History  . Marital status: Single    Spouse name: Not on file  . Number of children: Not on file  . Years of education: Not on file  . Highest education level: 12th grade  Occupational History  . Occupation: Quality Control  Tobacco Use  . Smoking status: Current Every Day Smoker    Packs/day: 0.50    Years: 40.00    Pack years: 20.00    Types: Cigarettes  . Smokeless tobacco: Never Used  Vaping Use  .  Vaping Use: Never used  Substance and Sexual Activity  . Alcohol use: No  . Drug use: No  . Sexual activity: Not on file  Other Topics Concern  . Not on file  Social History Narrative  . Not on file   Social Determinants of Health   Financial Resource Strain: Not on file  Food Insecurity: Not on file  Transportation Needs: Not on file  Physical Activity: Not on file  Stress: Not on file  Social Connections: Not on file    Activities of Daily Living In your present state of health, do you have any difficulty performing the following activities: 01/25/2021  Hearing? N  Vision? N  Difficulty concentrating or making decisions? N  Walking or climbing stairs? N  Dressing or bathing? N  Doing errands, shopping? N  Preparing Food and eating ? N  Using the Toilet? N  In the past six months, have you accidently leaked urine? N  Do you have problems with loss of bowel control? N  Managing your Medications? N  Managing your Finances? N  Housekeeping or managing your Housekeeping? N  Some recent data might be hidden    Patient Education/ Literacy How often do you need to have someone help you when you read instructions, pamphlets, or other written materials from your doctor or pharmacy?: 1 - Never What is the last grade level you completed in school?: 12th grade  Exercise Current Exercise Habits: The patient does not participate in regular exercise at present  Diet Patient reports consuming 2 meals a day and 2 snack(s) a day Patient reports that her primary diet is: Regular Patient reports that she does have regular access to food.   Depression Screen PHQ 2/9 Scores 01/25/2021 11/30/2020 05/20/2020 01/31/2019 12/19/2018 11/28/2018 09/06/2018  PHQ - 2 Score 0 0 0 0 0 0 0  PHQ- 9 Score - - 0 - 0 - -     Fall Risk Fall Risk  01/25/2021 11/30/2020 05/20/2020 10/07/2019 01/31/2019  Falls in the past year? 0 0 0 0 0  Number falls in past yr: - - - - -  Injury with Fall? - - - - -  Follow  up Falls evaluation completed - - - -     Objective:  Elizabeth Wu seemed alert and oriented and she participated appropriately during our telephone visit.  Blood Pressure Weight BMI  BP Readings from Last 3 Encounters:  11/30/20 123/61  05/20/20 137/77  10/07/19 138/75   Wt Readings from Last 3 Encounters:  11/30/20 165 lb (74.8 kg)  05/20/20 165 lb (74.8 kg)  10/07/19 171 lb (77.6 kg)   BMI Readings from Last 1 Encounters:  11/30/20 26.63 kg/m    *Unable to obtain current vital signs, weight, and BMI due to telephone visit type  Hearing/Vision  . Jenise did not seem to have difficulty with hearing/understanding during the  telephone conversation . Reports that she has had a formal eye exam by an eye care professional within the past year . Reports that she has not had a formal hearing evaluation within the past year *Unable to fully assess hearing and vision during telephone visit type  Cognitive Function: 6CIT Screen 01/25/2021 10/07/2019  What Year? 0 points 0 points  What month? 0 points 0 points  What time? 0 points 0 points  Count back from 20 0 points 0 points  Months in reverse 0 points 2 points  Repeat phrase 0 points 0 points  Total Score 0 2   (Normal:0-7, Significant for Dysfunction: >8)  Normal Cognitive Function Screening: Yes   Immunization & Health Maintenance Record Immunization History  Administered Date(s) Administered  . Fluad Quad(high Dose 65+) 08/21/2019, 08/14/2020  . Influenza Split 09/05/2013  . Influenza, High Dose Seasonal PF 09/18/2018  . Influenza,inj,Quad PF,6+ Mos 09/13/2016  . Influenza-Unspecified 08/20/2014, 08/25/2015, 09/18/2017  . PFIZER(Purple Top)SARS-COV-2 Vaccination 12/11/2019, 01/01/2020, 09/16/2020  . Pneumococcal Conjugate-13 11/10/2017  . Pneumococcal Polysaccharide-23 11/28/2018  . Tdap 06/11/2010  . Zoster 06/30/2011    Health Maintenance  Topic Date Due  . TETANUS/TDAP  11/30/2021 (Originally 06/11/2020)  .  Fecal DNA (Cologuard)  07/07/2022  . MAMMOGRAM  10/28/2022  . INFLUENZA VACCINE  Completed  . DEXA SCAN  Completed  . COVID-19 Vaccine  Completed  . Hepatitis C Screening  Completed  . PNA vac Low Risk Adult  Completed  . HPV VACCINES  Aged Out       Assessment  This is a routine wellness examination for Elizabeth Wu.  Health Maintenance: Due or Overdue There are no preventive care reminders to display for this patient.  Elizabeth Wu does not need a referral for Community Assistance: Care Management:   no Social Work:    no Prescription Assistance:  no Nutrition/Diabetes Education:  no   Plan:  Personalized Goals Goals Addressed            This Visit's Progress   . Patient Stated       01/25/2021 AWV Goal: Exercise for General Health   Patient will verbalize understanding of the benefits of increased physical activity:  Exercising regularly is important. It will improve your overall fitness, flexibility, and endurance.  Regular exercise also will improve your overall health. It can help you control your weight, reduce stress, and improve your bone density.  Over the next year, patient will increase physical activity as tolerated with a goal of at least 150 minutes of moderate physical activity per week.   You can tell that you are exercising at a moderate intensity if your heart starts beating faster and you start breathing faster but can still hold a conversation.  Moderate-intensity exercise ideas include:  Walking 1 mile (1.6 km) in about 15 minutes  Biking  Hiking  Golfing  Dancing  Water aerobics  Patient will verbalize understanding of everyday activities that increase physical activity by providing examples like the following: ? Yard work, such as: ? Pushing a Conservation officer, nature ? Raking and bagging leaves ? Washing your car ? Pushing a stroller ? Shoveling snow ? Gardening ? Washing windows or floors  Patient will be able to explain general safety  guidelines for exercising:   Before you start a new exercise program, talk with your health care provider.  Do not exercise so much that you hurt yourself, feel dizzy, or get very short of breath.  Wear comfortable clothes and wear shoes with  good support.  Drink plenty of water while you exercise to prevent dehydration or heat stroke.  Work out until your breathing and your heartbeat get faster.       Personalized Health Maintenance & Screening Recommendations  up to date  Lung Cancer Screening Recommended: yes (Low Dose CT Chest recommended if Age 11-80 years, 30 pack-year currently smoking OR have quit w/in past 15 years) Hepatitis C Screening recommended: no HIV Screening recommended: no  Advanced Directives: Written information was not prepared per patient's request.  Referrals & Orders No orders of the defined types were placed in this encounter.   Follow-up Plan . Follow-up with [name], DO as planned    I have personally reviewed and noted the following in the patient's chart:   . Medical and social history . Use of alcohol, tobacco or illicit drugs  . Current medications and supplements . Functional ability and status . Nutritional status . Physical activity . Advanced directives . List of other physicians . Hospitalizations, surgeries, and ER visits in previous 12 months . Vitals . Screenings to include cognitive, depression, and falls . Referrals and appointments  In addition, I have reviewed and discussed with Elizabeth Wu certain preventive protocols, quality metrics, and best practice recommendations. A written personalized care plan for preventive services as well as general preventive health recommendations is available and can be mailed to the patient at her request.      Felicity Coyer, LPN    06/21/8589  Patient declined after visit summary

## 2021-01-26 DIAGNOSIS — H5200 Hypermetropia, unspecified eye: Secondary | ICD-10-CM | POA: Diagnosis not present

## 2021-01-26 DIAGNOSIS — E7800 Pure hypercholesterolemia, unspecified: Secondary | ICD-10-CM | POA: Diagnosis not present

## 2021-01-26 DIAGNOSIS — H25813 Combined forms of age-related cataract, bilateral: Secondary | ICD-10-CM | POA: Diagnosis not present

## 2021-01-29 DIAGNOSIS — Z0100 Encounter for examination of eyes and vision without abnormal findings: Secondary | ICD-10-CM | POA: Diagnosis not present

## 2021-02-02 ENCOUNTER — Other Ambulatory Visit: Payer: Self-pay

## 2021-02-02 ENCOUNTER — Ambulatory Visit (INDEPENDENT_AMBULATORY_CARE_PROVIDER_SITE_OTHER): Payer: Medicare HMO | Admitting: Family Medicine

## 2021-02-02 VITALS — BP 138/74 | HR 76 | Temp 97.4°F | Ht 66.0 in | Wt 162.0 lb

## 2021-02-02 DIAGNOSIS — R0781 Pleurodynia: Secondary | ICD-10-CM | POA: Diagnosis not present

## 2021-02-02 DIAGNOSIS — M25559 Pain in unspecified hip: Secondary | ICD-10-CM | POA: Diagnosis not present

## 2021-02-02 MED ORDER — NAPROXEN 500 MG PO TBEC: 500.0000 mg | DELAYED_RELEASE_TABLET | Freq: Two times a day (BID) | ORAL | 0 refills | Status: DC

## 2021-02-02 MED ORDER — GABAPENTIN 100 MG PO CAPS
100.0000 mg | ORAL_CAPSULE | Freq: Every day | ORAL | 2 refills | Status: DC
Start: 2021-02-02 — End: 2021-03-01

## 2021-02-02 NOTE — Progress Notes (Signed)
Subjective: CC: Hip pain PCP: Elizabeth Norlander, DO PYK:DXIPJA Elizabeth Wu is a 70 y.o. female presenting to clinic today for:  1.  Hip pain/ breast pain Patient with ongoing right low back pain/hip pain that radiates down the right lower extremity.  The symptoms are still intermittent but occurring 2-3 times per day and lasting slightly longer than they were previously.  She never did go to physical therapy due to financial constraints.   She reports gait is antalgic and refractory to Motrin OTC.  She denies any sensory changes.  She has had a lesion on the left breast that has been tender to touch.  She had normal mammogram recently.  When she presses deep down it sensation and pain in her breast.  No reports of nipple discharge, lumps or bumps.  She has been using heat in this area.   ROS: Per HPI  No Known Allergies Past Medical History:  Diagnosis Date  . Allergy   . DJD (degenerative joint disease)   . Hyperlipidemia   . Osteopenia     Current Outpatient Medications:  .  ibuprofen (ADVIL,MOTRIN) 200 MG tablet, Take 200 mg by mouth every 6 (six) hours as needed., Disp: , Rfl:  .  losartan (COZAAR) 50 MG tablet, Take 1 tablet (50 mg total) by mouth daily., Disp: 90 tablet, Rfl: 3 .  simvastatin (ZOCOR) 20 MG tablet, Take 1 tablet (20 mg total) by mouth at bedtime., Disp: 90 tablet, Rfl: 3 Social History   Socioeconomic History  . Marital status: Single    Spouse name: Not on file  . Number of children: Not on file  . Years of education: Not on file  . Highest education level: 12th grade  Occupational History  . Occupation: Quality Control  Tobacco Use  . Smoking status: Current Every Day Smoker    Packs/day: 0.50    Years: 40.00    Pack years: 20.00    Types: Cigarettes  . Smokeless tobacco: Never Used  Vaping Use  . Vaping Use: Never used  Substance and Sexual Activity  . Alcohol use: No  . Drug use: No  . Sexual activity: Not on file  Other Topics Concern  .  Not on file  Social History Narrative  . Not on file   Social Determinants of Health   Financial Resource Strain: Not on file  Food Insecurity: Not on file  Transportation Needs: Not on file  Physical Activity: Not on file  Stress: Not on file  Social Connections: Not on file  Intimate Partner Violence: Not on file   Family History  Problem Relation Age of Onset  . Alzheimer's disease Mother   . Cancer Father        stomach cancer  . Diabetes Sister   . Heart disease Brother        MI    Objective: Office vital signs reviewed. BP 138/74   Pulse 76   Temp (!) 97.4 F (36.3 C) (Temporal)   Ht 5\' 6"  (1.676 m)   Wt 162 lb (73.5 kg)   SpO2 97%   BMI 26.15 kg/m   Physical Examination:  General: Awake, alert, well nourished, No acute distress MSK: Gait is antalgic.  Patient is ambulating independently.  She has point tenderness to palpation to the costochondral space on the left at approximately rib level 5.  There is no palpable deformity here. Breast: Pendulous but symmetric.  No nipple inversion, deformity  Assessment/ Plan: 70 y.o. female   Hip  pain - Plan: naproxen (EC-NAPROSYN) 500 MG EC tablet, gabapentin (NEURONTIN) 100 MG capsule  Rib pain on left side - Plan: naproxen (EC-NAPROSYN) 500 MG EC tablet, gabapentin (NEURONTIN) 100 MG capsule  We discussed that her x-ray did not show any significant degenerative changes.  I do think that she would benefit from physical therapy and have asked her to consider proceeding with this.  The alternative would be to see a specialist for further evaluation.  In the meantime I have switched her Motrin to Naprosyn and given her gabapentin.  We discussed the sedating nature of gabapentin.  She will follow-up with me in about 6 weeks for recheck  With regards to the breast pain this actually rib pain which was reproducible on exam today.  Likely a costochondritis, potentially due to change in sleeping position in the setting of the  above.  Naprosyn and gabapentin.  Advised to use ice on the affected area.  Handout provided  No orders of the defined types were placed in this encounter.  No orders of the defined types were placed in this encounter.    Elizabeth Norlander, DO [place] [phone]

## 2021-02-02 NOTE — Patient Instructions (Signed)
Physical therapy is needed.  You have prescribed a nonsteroidal anti-inflammatory drug (NSAID) today. This will help with your pain and inflammation. Please do not take any other NSAIDs (ibuprofen/Motrin/Advil, naproxen/Aleve, meloxicam/Mobic, Voltaren/diclofenac). Please make sure to eat a meal when taking this medication.   Caution:  If you have a history of acid reflux/indigestion, I recommend that you take an antacid (such as Prilosec, Prevacid) daily while on the NSAID.  If you have a history of bleeding disorder, gastric ulcer, are on a blood thinner (like warfarin/Coumadin, Xarelto, Eliquis, etc) please do not take NSAID.  If you have ever had a heart attack, you should not take NSAIDs.    Costochondritis  Costochondritis is irritation and swelling (inflammation) of the tissue that connects the ribs to the breastbone (sternum). This tissue is called cartilage. Costochondritis causes pain in the front of the chest. Usually, the pain:  Starts slowly.  Is in more than one rib. What are the causes? The exact cause of this condition is not always known. It results from stress on the tissue in the affected area. The cause of this stress could be:  Chest injury.  Exercise or activity, such as lifting.  Very bad coughing. What increases the risk? You are more likely to develop this condition if you:  Are female.  Are 77-23 years old.  Recently started a new exercise or work activity.  Have low levels of vitamin D.  Have a condition that makes you cough often. What are the signs or symptoms? The main symptom of this condition is chest pain. The pain:  Usually starts slowly and can be sharp or dull.  Gets worse with deep breathing, coughing, or exercise.  Gets better with rest.  May be worse when you press on the affected area of your ribs and breastbone. How is this treated? This condition usually goes away on its own over time. Your doctor may prescribe an NSAID,  such as ibuprofen. This can help reduce pain and inflammation. Treatment may also include:  Resting and avoiding activities that make pain worse.  Putting heat or ice on the painful area.  Doing exercises to stretch your chest muscles. If these treatments do not help, your doctor may inject a numbing medicine to help relieve the pain. Follow these instructions at home: Managing pain, stiffness, and swelling  If told, put ice on the painful area. To do this: ? Put ice in a plastic bag. ? Place a towel between your skin and the bag. ? Leave the ice on for 20 minutes, 2-3 times a day.  If told, put heat on the affected area. Do this as often as told by your doctor. Use the heat source that your doctor recommends, such as a moist heat pack or a heating pad. ? Place a towel between your skin and the heat source. ? Leave the heat on for 20-30 minutes. ? Take off the heat if your skin turns bright red. This is very important if you cannot feel pain, heat, or cold. You may have a greater risk of getting burned.      Activity  Rest as told by your doctor.  Do not do anything that makes your pain worse. This includes any activities that use chest, belly (abdomen), and side muscles.  Do not lift anything that is heavier than 10 lb (4.5 kg), or the limit that you are told, until your doctor says that it is safe.  Return to your normal activities as told by  your doctor. Ask your doctor what activities are safe for you. General instructions  Take over-the-counter and prescription medicines only as told by your doctor.  Keep all follow-up visits as told by your doctor. This is important. Contact a doctor if:  You have chills or a fever.  Your pain does not go away or it gets worse.  You have a cough that does not go away. Get help right away if:  You are short of breath.  You have very bad chest pain that is not helped by medicines, heat, or ice. These symptoms may be an emergency.  Do not wait to see if the symptoms will go away. Get medical help right away. Call your local emergency services (911 in the [place] Do not drive yourself to the hospital. Summary  Costochondritis is irritation and swelling (inflammation) of the tissue that connects the ribs to the breastbone (sternum).  This condition causes pain in the front of the chest.  Treatment may include medicines, rest, heat or ice, and exercises. This information is not intended to replace advice given to you by your health care provider. Make sure you discuss any questions you have with your health care provider. Document Revised: 09/20/2019 Document Reviewed: 09/20/2019 Elsevier Patient Education  2021 [employer].

## 2021-02-08 ENCOUNTER — Ambulatory Visit: Payer: Medicare HMO | Attending: Family Medicine | Admitting: Physical Therapy

## 2021-02-08 ENCOUNTER — Other Ambulatory Visit: Payer: Self-pay

## 2021-02-08 ENCOUNTER — Encounter: Payer: Self-pay | Admitting: Physical Therapy

## 2021-02-08 DIAGNOSIS — M6281 Muscle weakness (generalized): Secondary | ICD-10-CM | POA: Diagnosis not present

## 2021-02-08 DIAGNOSIS — M25551 Pain in right hip: Secondary | ICD-10-CM | POA: Diagnosis not present

## 2021-02-08 DIAGNOSIS — R262 Difficulty in walking, not elsewhere classified: Secondary | ICD-10-CM | POA: Insufficient documentation

## 2021-02-08 NOTE — Therapy (Signed)
[facility] [place], [place], [address] Phone: [phone]   Fax:  [phone]  Physical Therapy Evaluation  Patient Details  Name: Elizabeth Wu MRN: [id number] Date of Birth: 24-Aug-1951 Referring Provider (PT): Ronnie Doss, DO   Encounter Date: 02/08/2021   PT End of Session - 02/08/21 4196    Visit Number 1    Number of Visits 6    Date for PT Re-Evaluation 03/29/21    Authorization Type [employer] Medicare (CX and KX modifiers)    PT Start Time 0815    PT Stop Time 0900    PT Time Calculation (min) 45 min    Activity Tolerance Patient tolerated treatment well    Behavior During Therapy [facility] for tasks assessed/performed           Past Medical History:  Diagnosis Date  . Allergy   . DJD (degenerative joint disease)   . Hyperlipidemia   . Osteopenia     Past Surgical History:  Procedure Laterality Date  . APPENDECTOMY    . HERNIA REPAIR    . TONSILLECTOMY    . TONSILLECTOMY      There were no vitals filed for this visit.    Subjective Assessment - 02/08/21 0908    Subjective COVID-19 screening performed upon arrival. Patient arrives to physical therapy with right hip pain that began insidiously around November 2022. Patient reports pain comes and goes and can run down to the right knee. Patient reports she also experiences intermittent spasms to R hip and glute. Patient reports pain increases if she sits too long and pain will increase when she comes to standing. Patient reports ability to perform all ADLs but with pain. Patient reports pain at worst as "severe" and pain at best as 2-3/10. Patient's goals are to decrease pain and improve standing and walking tolerance.    Pertinent History HTN    Limitations Sitting;Standing;Walking;House hold activities    How long can you sit comfortably? "depends"    How long can you stand comfortably? "depends"    How long can you walk comfortably? "depends"     Diagnostic tests x-ray: normal hip and lumbar spine x-ray    Patient Stated Goals stop this pain    Currently in Pain? Yes    Pain Location Hip    Pain Orientation Right    Pain Descriptors / Indicators Sharp    Pain Type Acute pain    Pain Onset More than a month ago    Pain Frequency Constant    Aggravating Factors  "it just hurts"    Pain Relieving Factors medicine    Effect of Pain on Daily Activities "at times- walking, sitting"              [facility] PT Assessment - 02/08/21 0001      Assessment   Medical Diagnosis Hip pain    Referring Provider (PT) Ronnie Doss, DO    Onset Date/Surgical Date --   November, 2021   Next MD Visit 03/01/2021    Prior Therapy no      Precautions   Precautions None      Restrictions   Weight Bearing Restrictions No      Balance Screen   Has the patient fallen in the past 6 months No    Has the patient had a decrease in activity level because of a fear of falling?  No    Is the patient reluctant to leave their home because of a  fear of falling?  No      Home [employer] residence      Prior Function   Level of Independence Independent      ROM / Strength   AROM / PROM / Strength Strength;AROM      AROM   Overall AROM  Deficits;Due to pain    Overall AROM Comments pain in anterior hip with all motions    AROM Assessment Site Hip    Right/Left Hip Right    Right Hip Flexion 103    Right Hip External Rotation  40    Right Hip Internal Rotation  8    Right Hip ABduction 15      Strength   Overall Strength Comments (+) pain with all MMT    Strength Assessment Site Hip;Knee    Right/Left Hip Right    Right Hip Flexion 3+/5    Right Hip Extension 3-/5    Right Hip ABduction 3-/5    Right/Left Knee Right    Right Knee Flexion 4-/5    Right Knee Extension 4-/5      Palpation   Palpation comment very tender to palpation to R hip musculature particularly R upper glute, piriformis, R QL and R  greater trochanter      Special Tests    Special Tests Hip Special Tests    Hip Special Tests  Saralyn Pilar (FABER) Test;Hip Scouring      Saralyn Pilar (FABER) Test   Findings Negative    Side Right      Hip Scouring   Findings Positive    Side Right    Comments increase of R hip pain      Transfers   Five time sit to stand comments  19.1 seconds with hands on knees    Comments slow and cautious transitions for sit<>stand and for bed mobility      Ambulation/Gait   Gait Pattern Step-through pattern;Decreased weight shift to right;Decreased hip/knee flexion - right;Antalgic;Lateral trunk lean to left;Decreased stance time - right                      Objective measurements completed on examination: See above findings.       [facility] Adult PT Treatment/Exercise - 02/08/21 0001      Modalities   Modalities Electrical Stimulation;Moist Heat      Moist Heat Therapy   Number Minutes Moist Heat 10 Minutes    Moist Heat Location Hip      Electrical Stimulation   Electrical Stimulation Location R glute and lateral hip    Electrical Stimulation Action pre-mod    Electrical Stimulation Parameters 80-150 hz x10 mins    Electrical Stimulation Goals Pain                  PT Education - 02/08/21 0920    Education Details SKTC, pillow squeeze, seated hip abduction, supine marching    Person(s) Educated Patient    Methods Explanation;Demonstration;Handout    Comprehension Verbalized understanding               PT Long Term Goals - 02/08/21 5093      PT LONG TERM GOAL #1   Title Patient will be independent with HEP    Time 6    Period Weeks    Status New      PT LONG TERM GOAL #2   Title Patient will demonstrate 4/5 or greater right LE MMT to improve stability  during functional tasks.    Time 6    Period Weeks    Status New      PT LONG TERM GOAL #3   Title Patient will improve functional LE strength as noted by the ability to perform 5x sit to stand  test with hands on knees in 13 seconds or less.    Time 6    Period Weeks    Status New      PT LONG TERM GOAL #4   Title Patient will report ability to ambulate community distances with right hip pain less than or equal to 3/10.    Time 6    Period Weeks    Status New                  Plan - 02/08/21 0924    Clinical Impression Statement Patient is a 70 year old female who presents to physical therapy with right hip pain, decreased R hip ROM, decreased R LE strength, and difficulty walking that began insidiously around November 2022. Patient (-) for R FABER test but positive for reproduction of pain on R hip scouring test. Patient very tender to palpation to R glute musculature, R greater trochanter and R QL region. Patient and PT discussed POC as well as HEP to which patient reported understanding. Patient would benefit from skilled physical therapy to address deficits and address patient's goals.    Personal Factors and Comorbidities Age;Comorbidity 1;Time since onset of injury/illness/exacerbation;Finances    Comorbidities HTN    Examination-Activity Limitations Locomotion Level;Stand    Stability/Clinical Decision Making Stable/Uncomplicated    Clinical Decision Making Low    Rehab Potential Good    PT Frequency 1x / week    PT Duration 6 weeks    PT Treatment/Interventions ADLs/Self Care Home Management;Cryotherapy;Electrical Stimulation;Iontophoresis 4mg /ml Dexamethasone;Moist Heat;Ultrasound;Neuromuscular re-education;Balance training;Therapeutic exercise;Therapeutic activities;Functional mobility training;Stair training;Gait training;Patient/family education;Passive range of motion;Manual techniques    PT Next Visit Plan nustep, LE strengthening and stretching, combo US/e-stim, STW/M as needed; modalities PRN for pain relief    PT Home Exercise Plan see patient education section    Consulted and Agree with Plan of Care Patient           Patient will benefit from  skilled therapeutic intervention in order to improve the following deficits and impairments:  Decreased range of motion,Difficulty walking,Decreased activity tolerance,Decreased balance,Decreased strength,Pain  Visit Diagnosis: Pain in right hip - Plan: PT plan of care cert/re-cert  Muscle weakness (generalized) - Plan: PT plan of care cert/re-cert  Difficulty in walking, not elsewhere classified - Plan: PT plan of care cert/re-cert     Problem List Patient Active Problem List   Diagnosis Date Noted  . Chronic constipation 05/20/2020  . Hypertension 05/29/2019  . Cholelithiases 09/13/2016  . Bursitis, hip 04/05/2016  . Meralgia paraesthetica 04/22/2014  . 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 Hyperlipidemia 05/31/2013  . Osteopenia of multiple sites 05/31/2013  . DJD (degenerative joint disease) 05/31/2013    Gabriela Eves, PT, DPT 02/08/2021, 9:38 AM  [facility] [address] [place], [place], [address] Phone: [phone]   Fax:  [phone]  Name: Elizabeth Wu MRN: [id number] Date of Birth: 09/19/1951

## 2021-02-15 ENCOUNTER — Encounter: Payer: Self-pay | Admitting: Physical Therapy

## 2021-02-15 ENCOUNTER — Other Ambulatory Visit: Payer: Self-pay

## 2021-02-15 ENCOUNTER — Ambulatory Visit: Payer: Medicare HMO | Admitting: Physical Therapy

## 2021-02-15 DIAGNOSIS — M6281 Muscle weakness (generalized): Secondary | ICD-10-CM

## 2021-02-15 DIAGNOSIS — R262 Difficulty in walking, not elsewhere classified: Secondary | ICD-10-CM | POA: Diagnosis not present

## 2021-02-15 DIAGNOSIS — M25551 Pain in right hip: Secondary | ICD-10-CM | POA: Diagnosis not present

## 2021-02-15 NOTE — Therapy (Signed)
[facility] [place], [place], [address] Phone: [phone]   Fax:  [phone]  Physical Therapy Treatment  Patient Details  Name: Elizabeth Wu MRN: [id number] Date of Birth: 1951-09-09 Referring Provider (PT): Ronnie Doss, DO   Encounter Date: 02/15/2021   PT End of Session - 02/15/21 0806    Visit Number 2    Number of Visits 6    Date for PT Re-Evaluation 03/29/21    Authorization Type [employer] Medicare (CX and KX modifiers)    PT Start Time 0730    PT Stop Time 0814    PT Time Calculation (min) 44 min    Activity Tolerance Patient limited by pain    Behavior During Therapy [facility] for tasks assessed/performed           Past Medical History:  Diagnosis Date  . Allergy   . DJD (degenerative joint disease)   . Hyperlipidemia   . Osteopenia     Past Surgical History:  Procedure Laterality Date  . APPENDECTOMY    . HERNIA REPAIR    . TONSILLECTOMY    . TONSILLECTOMY      There were no vitals filed for this visit.   Subjective Assessment - 02/15/21 0801    Subjective COVID-19 screening performed upon arrival. Patient arrives stating pain increased over the weekend while she was walking around [employer].    Pertinent History HTN    Limitations Sitting;Standing;Walking;House hold activities    How long can you sit comfortably? "depends"    How long can you stand comfortably? "depends"    How long can you walk comfortably? "depends"    Diagnostic tests x-ray: normal hip and lumbar spine x-ray    Patient Stated Goals stop this pain    Currently in Pain? Yes   did not provide number on pain scale             [facility] PT Assessment - 02/15/21 0001      Assessment   Medical Diagnosis Hip pain    Referring Provider (PT) Ronnie Doss, DO    Next MD Visit 03/01/2021    Prior Therapy no      Precautions   Precautions None                         [facility] Adult PT Treatment/Exercise -  02/15/21 0001      Exercises   Exercises Knee/Hip      Knee/Hip Exercises: Aerobic   Nustep Level 2 x10 mins      Knee/Hip Exercises: Supine   Bridges AROM;Both;2 sets;10 reps    Other Supine Knee/Hip Exercises pillow squeeze 3" hold 2x10    Other Supine Knee/Hip Exercises supine clam shell 3" hold 2x10      Modalities   Modalities Electrical Stimulation;Moist Heat      Moist Heat Therapy   Number Minutes Moist Heat 15 Minutes    Moist Heat Location Hip      Electrical Stimulation   Electrical Stimulation Location right glute and lateral hip    Electrical Stimulation Action pre-mod    Electrical Stimulation Parameters 80-150 hz x15 mins    Electrical Stimulation Goals Pain                       PT Long Term Goals - 02/08/21 4008      PT LONG TERM GOAL #1   Title Patient will be independent with HEP  Time 6    Period Weeks    Status New      PT LONG TERM GOAL #2   Title Patient will demonstrate 4/5 or greater right LE MMT to improve stability during functional tasks.    Time 6    Period Weeks    Status New      PT LONG TERM GOAL #3   Title Patient will improve functional LE strength as noted by the ability to perform 5x sit to stand test with hands on knees in 13 seconds or less.    Time 6    Period Weeks    Status New      PT LONG TERM GOAL #4   Title Patient will report ability to ambulate community distances with right hip pain less than or equal to 3/10.    Time 6    Period Weeks    Status New                 Plan - 02/15/21 0806    Clinical Impression Statement Patient arrived to physical therapy with severe reports of right hip pain. Patient guided through TEs but with intermittent rest breaks secondary to fatigue and pain. Patient denied any increase pain after modalities.    Personal Factors and Comorbidities Age;Comorbidity 1;Time since onset of injury/illness/exacerbation;Finances    Comorbidities HTN    Examination-Activity  Limitations Locomotion Level;Stand    Stability/Clinical Decision Making Stable/Uncomplicated    Clinical Decision Making Low    Rehab Potential Good    PT Frequency 1x / week    PT Duration 6 weeks    PT Treatment/Interventions ADLs/Self Care Home Management;Cryotherapy;Electrical Stimulation;Iontophoresis 4mg /ml Dexamethasone;Moist Heat;Ultrasound;Neuromuscular re-education;Balance training;Therapeutic exercise;Therapeutic activities;Functional mobility training;Stair training;Gait training;Patient/family education;Passive range of motion;Manual techniques    PT Next Visit Plan nustep, LE strengthening and stretching, combo US/e-stim, STW/M as needed; modalities PRN for pain relief    PT Home Exercise Plan see patient education section    Consulted and Agree with Plan of Care Patient           Patient will benefit from skilled therapeutic intervention in order to improve the following deficits and impairments:  Decreased range of motion,Difficulty walking,Decreased activity tolerance,Decreased balance,Decreased strength,Pain  Visit Diagnosis: Pain in right hip  Muscle weakness (generalized)  Difficulty in walking, not elsewhere classified     Problem List Patient Active Problem List   Diagnosis Date Noted  . Chronic constipation 05/20/2020  . Hypertension 05/29/2019  . Cholelithiases 09/13/2016  . Bursitis, hip 04/05/2016  . Meralgia paraesthetica 04/22/2014  . 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 Hyperlipidemia 05/31/2013  . Osteopenia of multiple sites 05/31/2013  . DJD (degenerative joint disease) 05/31/2013    Gabriela Eves, PT, DPT 02/15/2021, 8:20 AM  [facility] [place], [place], [address] Phone: [phone]   Fax:  [phone]  Name: Elizabeth Wu MRN: [id number] Date of Birth: 07/15/1951

## 2021-02-22 ENCOUNTER — Ambulatory Visit: Payer: Medicare HMO | Attending: Family Medicine | Admitting: Physical Therapy

## 2021-02-22 ENCOUNTER — Encounter: Payer: Self-pay | Admitting: Physical Therapy

## 2021-02-22 ENCOUNTER — Other Ambulatory Visit: Payer: Self-pay

## 2021-02-22 DIAGNOSIS — M25551 Pain in right hip: Secondary | ICD-10-CM | POA: Diagnosis not present

## 2021-02-22 DIAGNOSIS — M6281 Muscle weakness (generalized): Secondary | ICD-10-CM | POA: Insufficient documentation

## 2021-02-22 DIAGNOSIS — R262 Difficulty in walking, not elsewhere classified: Secondary | ICD-10-CM | POA: Insufficient documentation

## 2021-02-22 NOTE — Therapy (Signed)
[facility] [place], [place], [address] Phone: [phone]   Fax:  [phone]  Physical Therapy Treatment  Patient Details  Name: Elizabeth Wu MRN: [id number] Date of Birth: [birth year] Referring Provider (PT): Ronnie Doss, DO   Encounter Date: 02/22/2021   PT End of Session - 02/22/21 0827    Visit Number 3    Number of Visits 6    Date for PT Re-Evaluation 03/29/21    Authorization Type [employer] Medicare (CX and KX modifiers)    PT Start Time 0817    PT Stop Time 0906    PT Time Calculation (min) 49 min    Activity Tolerance Patient limited by pain    Behavior During Therapy [facility] for tasks assessed/performed           Past Medical History:  Diagnosis Date  . Allergy   . DJD (degenerative joint disease)   . Hyperlipidemia   . Osteopenia     Past Surgical History:  Procedure Laterality Date  . APPENDECTOMY    . HERNIA REPAIR    . TONSILLECTOMY    . TONSILLECTOMY      There were no vitals filed for this visit.   Subjective Assessment - 02/22/21 0823    Subjective COVID-19 screening performed upon arrival. Patient arrives reporting discomfort at times that limits her ability to ambulate.    Pertinent History HTN    Limitations Sitting;Standing;Walking;House hold activities    How long can you sit comfortably? "depends"    How long can you stand comfortably? "depends"    How long can you walk comfortably? "depends"    Diagnostic tests x-ray: normal hip and lumbar spine x-ray    Patient Stated Goals stop this pain    Currently in Pain? Yes    Pain Score --   No pain score provided   Pain Location Hip    Pain Orientation Right    Pain Descriptors / Indicators Discomfort    Pain Type Acute pain    Pain Onset More than a month ago    Pain Frequency Constant              [facility] PT Assessment - 02/22/21 0001      Assessment   Medical Diagnosis Hip pain    Referring Provider (PT) Ronnie Doss, DO    Next MD Visit 03/01/2021    Prior Therapy no      Precautions   Precautions None      Restrictions   Weight Bearing Restrictions No                         [facility] Adult PT Treatment/Exercise - 02/22/21 0001      Knee/Hip Exercises: Aerobic   Nustep L3, seat 7 x12 min for ROM      Knee/Hip Exercises: Standing   Hip Flexion AROM;Right;Knee bent   x2 rep stopped d/t pain   Rocker Board 3 minutes      Knee/Hip Exercises: Supine   Short Arc Quad Sets AROM;Right;2 sets;10 reps    Heel Slides AROM;Right;15 reps    Hip Adduction Isometric Strengthening;Both;20 reps    Bridges AROM;Both;2 sets;10 reps    Other Supine Knee/Hip Exercises supine clam shell 3" hold 2x10   yellow theraband     Modalities   Modalities Electrical Stimulation      Electrical Stimulation   Electrical Stimulation Location right glute and lateral hip  PT Long Term Goals - 02/08/21 1884      PT LONG TERM GOAL #1   Title Patient will be independent with HEP    Time 6    Period Weeks    Status New      PT LONG TERM GOAL #2   Title Patient will demonstrate 4/5 or greater right LE MMT to improve stability during functional tasks.    Time 6    Period Weeks    Status New      PT LONG TERM GOAL #3   Title Patient will improve functional LE strength as noted by the ability to perform 5x sit to stand test with hands on knees in 13 seconds or less.    Time 6    Period Weeks    Status New      PT LONG TERM GOAL #4   Title Patient will report ability to ambulate community distances with right hip pain less than or equal to 3/10.    Time 6    Period Weeks    Status New                 Plan - 02/22/21 0909    Clinical Impression Statement Patient presented in clinic with reports of R hip pain that intensifies to level of limiting ambulation. Patient progressed through more AROM supine exercises due to pain in standing. Some LBP  reported with briding. Normal stimulation response noted following removal of the modality.    Personal Factors and Comorbidities Age;Comorbidity 1;Time since onset of injury/illness/exacerbation;Finances    Comorbidities HTN    Examination-Activity Limitations Locomotion Level;Stand    Stability/Clinical Decision Making Stable/Uncomplicated    Rehab Potential Good    PT Frequency 1x / week    PT Duration 6 weeks    PT Treatment/Interventions ADLs/Self Care Home Management;Cryotherapy;Electrical Stimulation;Iontophoresis 4mg /ml Dexamethasone;Moist Heat;Ultrasound;Neuromuscular re-education;Balance training;Therapeutic exercise;Therapeutic activities;Functional mobility training;Stair training;Gait training;Patient/family education;Passive range of motion;Manual techniques    PT Next Visit Plan nustep, LE strengthening and stretching, combo US/e-stim, STW/M as needed; modalities PRN for pain relief    PT Home Exercise Plan see patient education section    Consulted and Agree with Plan of Care Patient           Patient will benefit from skilled therapeutic intervention in order to improve the following deficits and impairments:  Decreased range of motion,Difficulty walking,Decreased activity tolerance,Decreased balance,Decreased strength,Pain  Visit Diagnosis: Pain in right hip  Muscle weakness (generalized)  Difficulty in walking, not elsewhere classified     Problem List Patient Active Problem List   Diagnosis Date Noted  . Chronic constipation 05/20/2020  . Hypertension 05/29/2019  . Cholelithiases 09/13/2016  . Bursitis, hip 04/05/2016  . Meralgia paraesthetica 04/22/2014  . 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 Hyperlipidemia 05/31/2013  . Osteopenia of multiple sites 05/31/2013  . DJD (degenerative joint disease) 05/31/2013    Standley Brooking 02/22/2021, 9:11 AM  [facility] [place], [place],  [address] Phone: [phone]   Fax:  [phone]  Name: Elizabeth Wu MRN: [id number] Date of Birth: 06/24/1951

## 2021-03-01 ENCOUNTER — Ambulatory Visit (INDEPENDENT_AMBULATORY_CARE_PROVIDER_SITE_OTHER): Payer: Medicare HMO | Admitting: Family Medicine

## 2021-03-01 ENCOUNTER — Other Ambulatory Visit: Payer: Self-pay

## 2021-03-01 ENCOUNTER — Ambulatory Visit: Payer: Medicare HMO | Admitting: Physical Therapy

## 2021-03-01 ENCOUNTER — Telehealth: Payer: Self-pay

## 2021-03-01 ENCOUNTER — Encounter: Payer: Self-pay | Admitting: Family Medicine

## 2021-03-01 VITALS — BP 125/71 | HR 71 | Temp 98.4°F | Ht 66.0 in | Wt 159.8 lb

## 2021-03-01 DIAGNOSIS — R262 Difficulty in walking, not elsewhere classified: Secondary | ICD-10-CM

## 2021-03-01 DIAGNOSIS — M25559 Pain in unspecified hip: Secondary | ICD-10-CM | POA: Diagnosis not present

## 2021-03-01 DIAGNOSIS — M25551 Pain in right hip: Secondary | ICD-10-CM | POA: Diagnosis not present

## 2021-03-01 DIAGNOSIS — E559 Vitamin D deficiency, unspecified: Secondary | ICD-10-CM

## 2021-03-01 DIAGNOSIS — M6281 Muscle weakness (generalized): Secondary | ICD-10-CM | POA: Diagnosis not present

## 2021-03-01 DIAGNOSIS — R0781 Pleurodynia: Secondary | ICD-10-CM

## 2021-03-01 DIAGNOSIS — E782 Mixed hyperlipidemia: Secondary | ICD-10-CM | POA: Diagnosis not present

## 2021-03-01 MED ORDER — IBUPROFEN 600 MG PO TABS: 600.0000 mg | ORAL_TABLET | Freq: Two times a day (BID) | ORAL | 1 refills | Status: DC | PRN

## 2021-03-01 MED ORDER — METHYLPREDNISOLONE ACETATE 80 MG/ML IJ SUSP
80.0000 mg | Freq: Once | INTRAMUSCULAR | Status: AC
  Administered 2021-03-01: 80 mg via INTRAMUSCULAR

## 2021-03-01 NOTE — Patient Instructions (Signed)
You got a steroid shot today to try and help with your pain I think you should strongly consider seeing an orthopedist for help with your hip as well.  Please let me know if you are willing to do this.  I worry about the degree of pain you were in today.  Don't forget you are supposed to be taking Vitamin D over the counter EVERY day.  You had labs performed today.  You will be contacted with the results of the labs once they are available, usually in the next 3 business days for routine lab work.  If you have an active my chart account, they will be released to your MyChart.  If you prefer to have these labs released to you via telephone, please let us know.  If you had a pap smear or biopsy performed, expect to be contacted in about 7-10 days.

## 2021-03-01 NOTE — Telephone Encounter (Signed)
Pharm corrected

## 2021-03-01 NOTE — Progress Notes (Signed)
Subjective: CC: hip pain PCP: Elizabeth Norlander, DO WUJ:[id number] Elizabeth Wu is a 70 y.o. female presenting to clinic today for:  1. Hip pain Started on Gabapentin last visit for pain.  Undergoing PT as well. The PT does seem to be helping but she continues to have flares of severe pain, with last being Saturday.  Gabapentin did not prove to be especially helpful, nor did Naprosyn.  She gets better relief from Motrin and has been taking 600 mg as needed.  She denies any falls.  No reports of sensory changes.  She still does not want to see an orthopedist yet.  2.  Hyperlipidemia with hypertension Patient is compliant with Zocor 20 mg daily and losartan 50 mg daily.  No reports of chest pain, shortness of breath, edema   ROS: Per HPI  No Known Allergies Past Medical History:  Diagnosis Date  . Allergy   . DJD (degenerative joint disease)   . Hyperlipidemia   . Osteopenia     Current Outpatient Medications:  .  gabapentin (NEURONTIN) 100 MG capsule, Take 1 capsule (100 mg total) by mouth at bedtime. (for pain), Disp: 30 capsule, Rfl: 2 .  losartan (COZAAR) 50 MG tablet, Take 1 tablet (50 mg total) by mouth daily., Disp: 90 tablet, Rfl: 3 .  naproxen (EC-NAPROSYN) 500 MG EC tablet, Take 1 tablet (500 mg total) by mouth 2 (two) times daily with a meal. (IF needed for pain), Disp: 60 tablet, Rfl: 0 .  simvastatin (ZOCOR) 20 MG tablet, Take 1 tablet (20 mg total) by mouth at bedtime., Disp: 90 tablet, Rfl: 3 Social History   Socioeconomic History  . Marital status: Single    Spouse name: Not on file  . Number of children: Not on file  . Years of education: Not on file  . Highest education level: 12th grade  Occupational History  . Occupation: Quality Control  Tobacco Use  . Smoking status: Current Every Day Smoker    Packs/day: 0.50    Years: 40.00    Pack years: 20.00    Types: Cigarettes  . Smokeless tobacco: Never Used  Vaping Use  . Vaping Use: Never used  Substance and  Sexual Activity  . Alcohol use: No  . Drug use: No  . Sexual activity: Not on file  Other Topics Concern  . Not on file  Social History Narrative  . Not on file   Social Determinants of Health   Financial Resource Strain: Not on file  Food Insecurity: Not on file  Transportation Needs: Not on file  Physical Activity: Not on file  Stress: Not on file  Social Connections: Not on file  Intimate Partner Violence: Not on file   Family History  Problem Relation Age of Onset  . Alzheimer's disease Mother   . Cancer Father        stomach cancer  . Diabetes Sister   . Heart disease Brother        MI    Objective: Office vital signs reviewed. BP 125/71   Pulse 71   Temp 98.4 F (36.9 C)   Ht 5' 6" (1.676 m)   Wt 159 lb 12.8 oz (72.5 kg)   PF 98 L/min   BMI 25.79 kg/m   Physical Examination:  General: Awake, alert, well nourished, No acute distress HEENT: Normal, sclera white, MMM Cardio: regular rate and rhythm, S1S2 heard, no murmurs appreciated Pulm: clear to auscultation bilaterally, no wheezes, rhonchi or rales; normal work of breathing  on room air MSK: Initially was doing fine but when she got off of the exam table she looked to be quite a bit of pain.  She has point tenderness over the SI joint on the right.  Ambulating independently with normal tone  Assessment/ Plan: 70 y.o. female   Hip pain - Plan: methylPREDNISolone acetate (DEPO-MEDROL) injection 80 mg, ibuprofen (ADVIL) 600 MG tablet  Rib pain on left side - Plan: methylPREDNISolone acetate (DEPO-MEDROL) injection 80 mg, ibuprofen (ADVIL) 600 MG tablet  Mixed hyperlipidemia - Plan: CMP14+EGFR, Lipid Panel  Vitamin D deficiency - Plan: VITAMIN D 25 Hydroxy (Vit-D Deficiency, Fractures)  I do think that she would benefit from an orthopedic referral but she is holding off on this for now.  She will continue to work with physical therapy.  She was given a corticosteroid injection today and I have changed her  medication to ibuprofen 600, which she found to be of better benefit.  She will follow-up with me in the next 4 to 6 months, sooner if symptoms are worsening or not improving.  Fasting labs were obtained.  She will continue current regimen.  Check vitamin D level.  She is taking OTC vitamin D 2000 daily  No orders of the defined types were placed in this encounter.  No orders of the defined types were placed in this encounter.    Elizabeth Norlander, DO [place] [phone]

## 2021-03-01 NOTE — Patient Instructions (Signed)
  Lower Body: Toe Rise   Standing, place feet apart. Hold arms out for balance or use support. Rise up on toes. Hold _3___ seconds, then lower. Repeat immediately. Repeat __10__ times. Do __2__ sessions per day.   Half Squat to Chair   Stand with feet shoulder width apart. Push buttocks backward and lower slowly, sitting in chair lightly and returning to standing position. Complete _2_ sets of 10_ repetitions. Perform __2-3_ sessions per day.     Hip abduction   While sitting with good posture, tie theraband around knees and pull apart. Slowly resume starting position. x30 1-2 x day        SEATED MARCHING - ELASTIC BAND  Start by sitting in a chair with an elastic band wrapped around your lower thighs.  Next, move a knee upward, set it back down and then alternate to the other side. Perform 10-20 1-2x daily    Straight Leg Raise  Tighten stomach and slowly raise locked right leg __4__ inches from floor. Repeat __10-30__ times per set. Do __2__ sets per session. Do __2__ sessions per day.  Bridging  Slowly raise buttocks from floor, keeping stomach tight. Repeat _10___ times per set. Do __2__ sets per session. Do __2__ sessions per day.

## 2021-03-01 NOTE — Therapy (Signed)
[facility] [place], [place], [address] Phone: [phone]   Fax:  [phone]  Physical Therapy Treatment  Patient Details  Name: Elizabeth Wu MRN: [id number] Date of Birth: 18-May-1951 Referring Provider (PT): Ronnie Doss, DO   Encounter Date: 03/01/2021   PT End of Session - 03/01/21 0823    Visit Number 4    Number of Visits 6    Date for PT Re-Evaluation 03/29/21    Authorization Type [employer] Medicare (CX and KX modifiers)    PT Start Time 0821    PT Stop Time 0902    PT Time Calculation (min) 41 min    Activity Tolerance Patient limited by pain    Behavior During Therapy [facility] for tasks assessed/performed           Past Medical History:  Diagnosis Date  . Allergy   . DJD (degenerative joint disease)   . Hyperlipidemia   . Osteopenia     Past Surgical History:  Procedure Laterality Date  . APPENDECTOMY    . HERNIA REPAIR    . TONSILLECTOMY    . TONSILLECTOMY      There were no vitals filed for this visit.   Subjective Assessment - 03/01/21 0822    Subjective COVID-19 screening performed upon arrival. Patient reported less pain upon arrival yet saterday was 10/10 pain from walking in the store    Pertinent History HTN    Limitations Sitting;Standing;Walking;House hold activities    How long can you sit comfortably? "depends"    How long can you stand comfortably? "depends"    How long can you walk comfortably? "depends"    Diagnostic tests x-ray: normal hip and lumbar spine x-ray    Patient Stated Goals stop this pain    Currently in Pain? Yes    Pain Score 4     Pain Location Hip    Pain Orientation Right    Pain Descriptors / Indicators Discomfort    Pain Type Acute pain    Pain Onset More than a month ago    Pain Frequency Constant    Aggravating Factors  unsure    Pain Relieving Factors meds and rest                             [facility] Adult PT Treatment/Exercise -  03/01/21 0001      Knee/Hip Exercises: Aerobic   Nustep L3, seat 7 x12 min for ROM      Knee/Hip Exercises: Standing   Heel Raises Both;20 reps;3 seconds      Knee/Hip Exercises: Seated   Clamshell with TheraBand Yellow   x30   Marching Strengthening;Both;2 sets;10 reps    Marching Limitations yellow band    Sit to [employer] 10 reps;with UE support      Knee/Hip Exercises: Supine   Bridges Strengthening;Both;20 reps    Straight Leg Raises Strengthening;Both;2 sets;10 reps      Moist Heat Therapy   Number Minutes Moist Heat 15 Minutes    Moist Heat Location Hip      Electrical Stimulation   Electrical Stimulation Location right glute and lateral hip    Electrical Stimulation Action premod    Electrical Stimulation Parameters 80-_0  x32mn    Electrical Stimulation Goals Pain                  PT Education - 03/01/21 0829    Education Details HEP progression  Person(s) Educated Patient    Methods Explanation;Demonstration;Handout    Comprehension Verbalized understanding;Returned demonstration               PT Long Term Goals - 03/01/21 0825      PT LONG TERM GOAL #1   Title Patient will be independent with HEP    Baseline Issued and met today 03/01/21    Time 6    Period Weeks    Status Achieved      PT LONG TERM GOAL #2   Title Patient will demonstrate 4/5 or greater right LE MMT to improve stability during functional tasks.    Baseline Strength limited from pain 03/01/21    Time 6    Period Weeks    Status On-going      PT LONG TERM GOAL #3   Title Patient will improve functional LE strength as noted by the ability to perform 5x sit to stand test with hands on knees in 13 seconds or less.    Baseline 4x in 13econds, slow pace today 03/01/21    Time 6    Period Weeks    Status On-going      PT LONG TERM GOAL #4   Title Patient will report ability to ambulate community distances with right hip pain less than or equal to 3/10.    Baseline Pain  level increased up to 10/10 with a community distance 03/01/20    Time 6    Period Weeks    Status On-going                 Plan - 03/01/21 0843    Clinical Impression Statement Patient tolerated treatment fair due to pain in right hip. Patient has reported lower pain with little activity and increased pain after prolong walking or ADL's. Patient 4/10 to 10/10 range with activity. Today issued HEP progression with yellow band. Patient strength limited due to pain today. Patient met LTG#1 with all others not met due to pain deficts. Patient going to MD for F/U today and reported a possible shot. Will continue per MD recommendations.    Personal Factors and Comorbidities Age;Comorbidity 1;Time since onset of injury/illness/exacerbation;Finances    Comorbidities HTN    Examination-Activity Limitations Locomotion Level;Stand    Stability/Clinical Decision Making Stable/Uncomplicated    Rehab Potential Good    PT Frequency 1x / week    PT Duration 6 weeks    PT Treatment/Interventions ADLs/Self Care Home Management;Cryotherapy;Electrical Stimulation;Iontophoresis 27m/ml Dexamethasone;Moist Heat;Ultrasound;Neuromuscular re-education;Balance training;Therapeutic exercise;Therapeutic activities;Functional mobility training;Stair training;Gait training;Patient/family education;Passive range of motion;Manual techniques    PT Next Visit Plan to MD for F/U    Consulted and Agree with Plan of Care Patient           Patient will benefit from skilled therapeutic intervention in order to improve the following deficits and impairments:  Decreased range of motion,Difficulty walking,Decreased activity tolerance,Decreased balance,Decreased strength,Pain  Visit Diagnosis: Pain in right hip  Muscle weakness (generalized)  Difficulty in walking, not elsewhere classified     Problem List Patient Active Problem List   Diagnosis Date Noted  . Chronic constipation 05/20/2020  . Hypertension  05/29/2019  . Cholelithiases 09/13/2016  . Bursitis, hip 04/05/2016  . Meralgia paraesthetica 04/22/2014  . 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 Hyperlipidemia 05/31/2013  . Osteopenia of multiple sites 05/31/2013  . DJD (degenerative joint disease) 05/31/2013    CLadean Raya PTA 03/01/21 9:03 AM   [facility] [address]  [place], [place], [address] Phone: [phone]   Fax:  [phone]  Name: Elizabeth Wu MRN: [id number] Date of Birth: 1950/12/15

## 2021-03-02 LAB — CMP14+EGFR
ALT: 15 IU/L (ref 0–32)
AST: 15 IU/L (ref 0–40)
Albumin/Globulin Ratio: 1.8 (ref 1.2–2.2)
Albumin: 4.7 g/dL (ref 3.8–4.8)
Alkaline Phosphatase: 126 IU/L — ABNORMAL HIGH (ref 44–121)
BUN/Creatinine Ratio: 14 (ref 12–28)
BUN: 13 mg/dL (ref 8–27)
Bilirubin Total: 0.8 mg/dL (ref 0.0–1.2)
CO2: 24 mmol/L (ref 20–29)
Calcium: 9.9 mg/dL (ref 8.7–10.3)
Chloride: 103 mmol/L (ref 96–106)
Creatinine, Ser: 0.91 mg/dL (ref 0.57–1.00)
Globulin, Total: 2.6 g/dL (ref 1.5–4.5)
Glucose: 95 mg/dL (ref 65–99)
Potassium: 4.8 mmol/L (ref 3.5–5.2)
Sodium: 143 mmol/L (ref 134–144)
Total Protein: 7.3 g/dL (ref 6.0–8.5)
eGFR: 68 mL/min/{1.73_m2} (ref >59)

## 2021-03-02 LAB — LIPID PANEL
Chol/HDL Ratio: 3.9 ratio (ref 0.0–4.4)
Cholesterol, Total: 186 mg/dL (ref 100–199)
HDL: 48 mg/dL (ref >39)
LDL Chol Calc (NIH): 110 mg/dL — ABNORMAL HIGH (ref 0–99)
Triglycerides: 157 mg/dL — ABNORMAL HIGH (ref 0–149)
VLDL Cholesterol Cal: 28 mg/dL (ref 5–40)

## 2021-03-02 LAB — VITAMIN D 25 HYDROXY (VIT D DEFICIENCY, FRACTURES): Vit D, 25-Hydroxy: 35.1 ng/mL (ref 30.0–100.0)

## 2021-03-03 ENCOUNTER — Encounter: Payer: Medicare HMO | Admitting: Physical Therapy

## 2021-03-11 ENCOUNTER — Other Ambulatory Visit: Payer: Self-pay

## 2021-03-11 ENCOUNTER — Encounter: Payer: Self-pay | Admitting: Physical Therapy

## 2021-03-11 ENCOUNTER — Ambulatory Visit: Payer: Medicare HMO | Admitting: Physical Therapy

## 2021-03-11 DIAGNOSIS — M6281 Muscle weakness (generalized): Secondary | ICD-10-CM | POA: Diagnosis not present

## 2021-03-11 DIAGNOSIS — R262 Difficulty in walking, not elsewhere classified: Secondary | ICD-10-CM | POA: Diagnosis not present

## 2021-03-11 DIAGNOSIS — M25551 Pain in right hip: Secondary | ICD-10-CM

## 2021-03-11 NOTE — Patient Instructions (Signed)
Access Code: [email or web address] URL: [email or web address] Date: 03/11/2021 Prepared by: Almyra Free  Exercises Sit to Stand with Armchair - 1 x daily - 7 x weekly - 1-2 sets - 10 reps Supine Bridge - 2 x daily - 7 x weekly - 1-3 sets - 10 reps Standing Lumbar Extension at [place] - 4 x daily - 7 x weekly - 1-3 sets - 10 reps

## 2021-03-11 NOTE — Therapy (Signed)
[facility] [place], [place], [address] Phone: [phone]   Fax:  [phone]  Physical Therapy Treatment  Patient Details  Name: Elizabeth Wu MRN: [id number] Date of Birth: [birth year] Referring Provider (PT): Ronnie Doss, DO   Encounter Date: 03/11/2021   PT End of Session - 03/11/21 0906    Visit Number 5    Number of Visits 6    Date for PT Re-Evaluation 03/29/21    Authorization Type [employer] Medicare (CX and KX modifiers)    PT Start Time 0903    PT Stop Time 0946    PT Time Calculation (min) 43 min    Activity Tolerance Patient tolerated treatment well    Behavior During Therapy [facility] for tasks assessed/performed           Past Medical History:  Diagnosis Date  . Allergy   . DJD (degenerative joint disease)   . Hyperlipidemia   . Osteopenia     Past Surgical History:  Procedure Laterality Date  . APPENDECTOMY    . HERNIA REPAIR    . TONSILLECTOMY    . TONSILLECTOMY      There were no vitals filed for this visit.   Subjective Assessment - 03/11/21 0907    Subjective COVID-19 screening performed upon arrival. Patient reporting that pain runs down her thigh into groin and intermittently down her full leg.    Pertinent History HTN    Limitations Sitting;Standing;Walking;House hold activities    Diagnostic tests x-ray: normal hip and lumbar spine x-ray    Patient Stated Goals stop this pain    Currently in Pain? Yes    Pain Score 4     Pain Location Leg    Pain Orientation Right                             [facility] Adult PT Treatment/Exercise - 03/11/21 0001      Self-Care   Self-Care ADL's;Posture    ADL's sit to stand transfers with neutral spine; ADL mod to avoid flexion    Posture Sitting posture      Knee/Hip Exercises: Stretches   Hip Flexor Stretch Right;3 reps;10 seconds    Hip Flexor Stretch Limitations did in supine, sitting and standing; spasm in right with  standing stretch    Piriformis Stretch Both;1 rep;30 seconds    Piriformis Stretch Limitations seated fig 4      Knee/Hip Exercises: Aerobic   Nustep L3 x 6 min      Knee/Hip Exercises: Standing   Other Standing Knee Exercises back extensions with forearms on wall x 10      Knee/Hip Exercises: Seated   Other Seated Knee/Hip Exercises Bridge 2 x 10    Sit to Sand 10 reps;with UE support   from blue pad     Knee/Hip Exercises: Prone   Other Prone Exercises prone on elbows; prone press ups x 5      Manual Therapy   Manual Therapy Joint mobilization    Joint Mobilization Gd III/IV UPA mobs to bil lumbar                  PT Education - 03/11/21 1127    Education Details HEP    Person(s) Educated Patient    Methods Explanation;Demonstration;Handout    Comprehension Verbalized understanding;Returned demonstration               PT Long Term Goals - 03/01/21  0825      PT LONG TERM GOAL #1   Title Patient will be independent with HEP    Baseline Issued and met today 03/01/21    Time 6    Period Weeks    Status Achieved      PT LONG TERM GOAL #2   Title Patient will demonstrate 4/5 or greater right LE MMT to improve stability during functional tasks.    Baseline Strength limited from pain 03/01/21    Time 6    Period Weeks    Status On-going      PT LONG TERM GOAL #3   Title Patient will improve functional LE strength as noted by the ability to perform 5x sit to stand test with hands on knees in 13 seconds or less.    Baseline 4x in 13econds, slow pace today 03/01/21    Time 6    Period Weeks    Status On-going      PT LONG TERM GOAL #4   Title Patient will report ability to ambulate community distances with right hip pain less than or equal to 3/10.    Baseline Pain level increased up to 10/10 with a community distance 03/01/20    Time 6    Period Weeks    Status On-going                 Plan - 03/11/21 1128    Clinical Impression Statement  Patient reporting that pain has not improved much. Still hurting into right ant thigh and groin intermittently. She has tightness in bil hip flexors Rt>Lt. She had pain with UPA mobs in L4/5 and L5/S1. Mobs loosened up hip flexor tightness as evidenced by negative prone knee bend. We worked on lumbar extension exercises and correct body mechanics for sit to stand as well as discussing seated posture and avoiding soft furniture like the couch. Patient tolerated therapy well with no pain reported at end of session. She did get a spasm inthe right psoas with standing psoas stretch. She will benefit from core stabilization exercises.    PT Treatment/Interventions ADLs/Self Care Home Management;Cryotherapy;Electrical Stimulation;Iontophoresis 79m/ml Dexamethasone;Moist Heat;Ultrasound;Neuromuscular re-education;Balance training;Therapeutic exercise;Therapeutic activities;Functional mobility training;Stair training;Gait training;Patient/family education;Passive range of motion;Manual techniques    PT Next Visit Plan last visit/FOTO. prone lumbar stab. Continue to release hip flexors prn; lumbar DN?, check QL    PT Home Exercise Plan [email or web address]   Consulted and Agree with Plan of Care Patient           Patient will benefit from skilled therapeutic intervention in order to improve the following deficits and impairments:  Decreased range of motion,Difficulty walking,Decreased activity tolerance,Decreased balance,Decreased strength,Pain  Visit Diagnosis: Pain in right hip  Muscle weakness (generalized)  Difficulty in walking, not elsewhere classified     Problem List Patient Active Problem List   Diagnosis Date Noted  . Chronic constipation 05/20/2020  . Hypertension 05/29/2019  . Cholelithiases 09/13/2016  . Bursitis, hip 04/05/2016  . Meralgia paraesthetica 04/22/2014  . Arthritis due to [place]aptonuria ([place] 04/22/2014  . Hyperlipidemia 05/31/2013  . Osteopenia of multiple sites 05/31/2013  .  DJD (degenerative joint disease) 05/31/2013    JMadelyn FlavorsPT 03/11/2021, 12:27 PM  [facility] [address] [address]Phone: [phone]  Fax:  [phone] Name: CSonja ManseauMRN: [id number] of Birth: [birth year]

## 2021-03-17 ENCOUNTER — Ambulatory Visit: Payer: Medicare HMO | Admitting: Physical Therapy

## 2021-03-17 ENCOUNTER — Other Ambulatory Visit: Payer: Self-pay

## 2021-03-17 ENCOUNTER — Encounter: Payer: Self-pay | Admitting: Physical Therapy

## 2021-03-17 DIAGNOSIS — M6281 Muscle weakness (generalized): Secondary | ICD-10-CM

## 2021-03-17 DIAGNOSIS — R262 Difficulty in walking, not elsewhere classified: Secondary | ICD-10-CM | POA: Diagnosis not present

## 2021-03-17 DIAGNOSIS — M25551 Pain in right hip: Secondary | ICD-10-CM | POA: Diagnosis not present

## 2021-03-17 NOTE — Therapy (Signed)
[facility] [place], [place], [address] Phone: [phone]   Fax:  [phone]  Physical Therapy Treatment  Patient Details  Name: Elizabeth Wu MRN: [id number] Date of Birth: 04/08/1951 Referring Provider (PT): Ronnie Doss, DO   Encounter Date: 03/17/2021   PT End of Session - 03/17/21 1117    Visit Number 6    Number of Visits 12    Date for PT Re-Evaluation 04/02/21    Authorization Type [employer] Medicare (CX and KX modifiers)    Authorization Time Period 02/08/21-04/02/21 12 visits    PT Start Time 1115    PT Stop Time 1200    PT Time Calculation (min) 45 min    Activity Tolerance Patient tolerated treatment well    Behavior During Therapy [facility] for tasks assessed/performed           Past Medical History:  Diagnosis Date  . Allergy   . DJD (degenerative joint disease)   . Hyperlipidemia   . Osteopenia     Past Surgical History:  Procedure Laterality Date  . APPENDECTOMY    . HERNIA REPAIR    . TONSILLECTOMY    . TONSILLECTOMY      There were no vitals filed for this visit.   Subjective Assessment - 03/17/21 1122    Subjective COVID-19 screening performed upon arrival. Patient reporting that pain runs down her thigh into groin and intermittently down her full leg.    Pertinent History HTN    Limitations Sitting;Standing;Walking;House hold activities    Diagnostic tests x-ray: normal hip and lumbar spine x-ray    Currently in Pain? Yes    Pain Score 5     Pain Location Leg    Pain Orientation Right                             [facility] Adult PT Treatment/Exercise - 03/17/21 0001      Self-Care   Self-Care ADL's;Other Self-Care Comments    ADL's reviewed sit to stand transfer    Posture discussed sitting posture and chairs/couch    Other Self-Care Comments  Education on back anatomy and reasoning behind positioning and ADL modifcation; MFR with ball to gluteals but caused groin  pain      Knee/Hip Exercises: Stretches   Piriformis Stretch Both;1 rep;30 seconds    Piriformis Stretch Limitations seated fig 4    Other Knee/Hip Stretches Rt SDLY trunk rotation x 60 sec    Other Knee/Hip Stretches doorway stretch for left pecs 3x 30 sec      Knee/Hip Exercises: Aerobic   Nustep L3 x 8 min      Knee/Hip Exercises: Standing   Other Standing Knee Exercises back extensions with forearms on wall 2 x 10   1st set abolishes groin: 2nd set just sore in back; Additional sets completed prn during treatment to abolish sx.     Knee/Hip Exercises: Sidelying   Other Sidelying Knee/Hip Exercises --                       PT Long Term Goals - 03/17/21 1147      PT LONG TERM GOAL #1   Title Patient will be independent with HEP    Baseline pt not fully compliant with HEP    Status On-going      PT LONG TERM GOAL #2   Title Patient will demonstrate 4/5 or greater right LE  MMT to improve stability during functional tasks.    Baseline able to bridge and sit to stand without pain    Status Partially Met      PT LONG TERM GOAL #3   Title --      PT LONG TERM GOAL #4   Title Patient will report ability to ambulate community distances with right hip pain less than or equal to 3/10.    Status Not Met                 Plan - 03/17/21 1215    Clinical Impression Statement Patient presents with continued c/o intermittent groin pain. She is able to abolish sx with standing lumbar extension. She also complained of left pectoralis pain and was tender and tight to the touch. Various stretches were tried to address this, the best being SDLY trunk rotation. Bridging also abolishes patient's pain. We discussed back anatomy and what may be causing her pain, more body mechanics and need to be consistent with back extensions.    PT Frequency 1x / week    PT Duration 6 weeks    PT Treatment/Interventions ADLs/Self Care Home Management;Cryotherapy;Electrical  Stimulation;Iontophoresis 71m/ml Dexamethasone;Moist Heat;Ultrasound;Neuromuscular re-education;Balance training;Therapeutic exercise;Therapeutic activities;Functional mobility training;Stair training;Gait training;Patient/family education;Passive range of motion;Manual techniques    PT Next Visit Plan STW/MFR to right gluteals, continue extenstion biased exercise. prone lumbar stab.    Consulted and Agree with Plan of Care Patient           Patient will benefit from skilled therapeutic intervention in order to improve the following deficits and impairments:  Decreased range of motion,Difficulty walking,Decreased activity tolerance,Decreased balance,Decreased strength,Pain  Visit Diagnosis: Pain in right hip  Muscle weakness (generalized)  Difficulty in walking, not elsewhere classified     Problem List Patient Active Problem List   Diagnosis Date Noted  . Chronic constipation 05/20/2020  . Hypertension 05/29/2019  . Cholelithiases 09/13/2016  . Bursitis, hip 04/05/2016  . Meralgia paraesthetica 04/22/2014  . 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 Hyperlipidemia 05/31/2013  . Osteopenia of multiple sites 05/31/2013  . DJD (degenerative joint disease) 05/31/2013   JMadelyn FlavorsPT 03/17/2021, 12:28 PM  [facility] [address] [address]Phone: [phone]  Fax:  [phone] Name: Elizabeth NeedlesMRN: [id number] of Birth: 117-Mar-1952

## 2021-03-24 ENCOUNTER — Encounter: Payer: Self-pay | Admitting: Physical Therapy

## 2021-03-24 ENCOUNTER — Other Ambulatory Visit: Payer: Self-pay

## 2021-03-24 ENCOUNTER — Ambulatory Visit: Payer: Medicare HMO | Attending: Family Medicine | Admitting: Physical Therapy

## 2021-03-24 DIAGNOSIS — M6281 Muscle weakness (generalized): Secondary | ICD-10-CM | POA: Insufficient documentation

## 2021-03-24 DIAGNOSIS — M25551 Pain in right hip: Secondary | ICD-10-CM

## 2021-03-24 DIAGNOSIS — R262 Difficulty in walking, not elsewhere classified: Secondary | ICD-10-CM

## 2021-03-24 NOTE — Therapy (Signed)
[facility] [place], [place], [address] Phone: [phone]   Fax:  [phone]  Physical Therapy Treatment  Patient Details  Name: Elizabeth Wu MRN: [id number] Date of Birth: 1951/03/23 Referring Provider (PT): Ronnie Doss, DO   Encounter Date: 03/24/2021   PT End of Session - 03/24/21 0811    Visit Number 7    Number of Visits 12    Date for PT Re-Evaluation 04/02/21    Authorization Type [employer] (CX and KX modifiers)    Authorization Time Period 02/08/21-04/02/21 12 visits    PT Start Time 0815    PT Stop Time 0900    PT Time Calculation (min) 45 min    Behavior During Therapy [facility] for tasks assessed/performed           Past Medical History:  Diagnosis Date  . Allergy   . DJD (degenerative joint disease)   . Hyperlipidemia   . Osteopenia     Past Surgical History:  Procedure Laterality Date  . APPENDECTOMY    . HERNIA REPAIR    . TONSILLECTOMY    . TONSILLECTOMY      There were no vitals filed for this visit.   Subjective Assessment - 03/24/21 0818    Subjective COVID-19 screening performed upon arrival. Patient reports she applied Blue Emu yesterday to her back and leg and her pain feels better today. She states that sometimes the wall back extensions help and sometims they make her leg hurt.    Patient Stated Goals stop this pain    Currently in Pain? Yes    Pain Score 4     Pain Location Leg    Pain Orientation Right    Pain Descriptors / Indicators Discomfort    Pain Type Acute pain    Pain Onset More than a month ago    Pain Frequency Constant                             [facility] Adult PT Treatment/Exercise - 03/24/21 0001      Knee/Hip Exercises: Aerobic   Nustep L5 x 6 min      Knee/Hip Exercises: Seated   Sit to Sand 10 reps      Knee/Hip Exercises: Supine   Bridges 20 reps    Bridges with Clamshell 20 reps   green band isometric hold   Straight Leg  Raises Both;2 sets;5 reps    Straight Leg Raises Limitations feels in low back with right SLR      Knee/Hip Exercises: Sidelying   Hip ABduction Both;10 reps    Hip ABduction Limitations cues to not roll back    Clams 20 reps green band B      Knee/Hip Exercises: Prone   Hip Extension 2 sets;10 reps;Both    Hip Extension Limitations pillow under waist      Manual Therapy   Manual Therapy Soft tissue mobilization    Soft tissue mobilization in SDLY to lumbar spine along spinous processess; also TPR to right QL                       PT Long Term Goals - 03/24/21 0829      PT LONG TERM GOAL #3   Title Patient will improve functional LE strength as noted by the ability to perform 5x sit to stand test with hands on knees in 13 seconds or less.  Baseline 5x 12.46 sec    Status Achieved                 Plan - 03/24/21 0906    Clinical Impression Statement Patient reporting decreased pain after using Blue Emu. Still having right groin pain. She tolerated TE well. Had some low back pain with R SLR which was better with encouragement of ab set. Weak in bil gluteus medius. She will continue to benefit from core and hip strengthening.  POC ending so will need recert on date to continue remaining visits (12 approved).    Comorbidities HTN    Examination-Activity Limitations Locomotion Level;Stand    PT Frequency 1x / week    PT Duration 6 weeks    PT Treatment/Interventions ADLs/Self Care Home Management;Cryotherapy;Electrical Stimulation;Iontophoresis 4mg /ml Dexamethasone;Moist Heat;Ultrasound;Neuromuscular re-education;Balance training;Therapeutic exercise;Therapeutic activities;Functional mobility training;Stair training;Gait training;Patient/family education;Passive range of motion;Manual techniques    PT Next Visit Plan Recert if pt would like to continue (approved for 12 visits but date ending); continue hip strength. prone lumbar stab. Extension biased TE.    PT  Home Exercise Plan [id number] - sit to stand; bridge; hip ABD sidelying; wall lumbar ext    Consulted and Agree with Plan of Care Patient           Patient will benefit from skilled therapeutic intervention in order to improve the following deficits and impairments:  Decreased range of motion,Difficulty walking,Decreased activity tolerance,Decreased balance,Decreased strength,Pain  Visit Diagnosis: Pain in right hip  Muscle weakness (generalized)  Difficulty in walking, not elsewhere classified     Problem List Patient Active Problem List   Diagnosis Date Noted  . Chronic constipation 05/20/2020  . Hypertension 05/29/2019  . Cholelithiases 09/13/2016  . Bursitis, hip 04/05/2016  . Meralgia paraesthetica 04/22/2014  . 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 Hyperlipidemia 05/31/2013  . Osteopenia of multiple sites 05/31/2013  . DJD (degenerative joint disease) 05/31/2013    Madelyn Flavors PT 03/24/2021, 6:48 PM  [facility] [address] [place], [place], [address] Phone: [phone]   Fax:  [phone]  Name: Elizabeth Wu MRN: [id number] Date of Birth: July 09, 1951

## 2021-03-31 ENCOUNTER — Ambulatory Visit: Payer: Medicare HMO | Admitting: Physical Therapy

## 2021-04-14 ENCOUNTER — Encounter: Payer: Self-pay | Admitting: Nurse Practitioner

## 2021-04-14 ENCOUNTER — Ambulatory Visit (INDEPENDENT_AMBULATORY_CARE_PROVIDER_SITE_OTHER): Payer: Medicare HMO | Admitting: Nurse Practitioner

## 2021-04-14 DIAGNOSIS — J069 Acute upper respiratory infection, unspecified: Secondary | ICD-10-CM | POA: Insufficient documentation

## 2021-04-14 MED ORDER — CETIRIZINE HCL 10 MG PO TABS: 10.0000 mg | ORAL_TABLET | Freq: Every day | ORAL | 0 refills | Status: DC

## 2021-04-14 MED ORDER — CETIRIZINE HCL 10 MG PO TABS: 10.0000 mg | ORAL_TABLET | Freq: Every day | ORAL | 1 refills | Status: DC

## 2021-04-14 MED ORDER — SALINE SPRAY 0.65 % NA SOLN: 1.0000 | NASAL | 1 refills | Status: DC | PRN

## 2021-04-14 MED ORDER — PREDNISONE 10 MG (21) PO TBPK: ORAL_TABLET | ORAL | 0 refills | Status: DC

## 2021-04-14 NOTE — Progress Notes (Signed)
   Virtual Visit  Note Due to COVID-19 pandemic this visit was conducted virtually. This visit type was conducted due to national recommendations for restrictions regarding the COVID-19 Pandemic (e.g. social distancing, sheltering in place) in an effort to limit this patient's exposure and mitigate transmission in our community. All issues noted in this document were discussed and addressed.  A physical exam was not performed with this format.  I connected with Elizabeth Wu on 04/15/21 at  4:30 PM by telephone and verified that I am speaking with the correct person using two identifiers. Elizabeth Wu is currently located at home during visit. The provider, Ivy Lynn, NP is located in their office at time of visit.  I discussed the limitations, risks, security and privacy concerns of performing an evaluation and management service by telephone and the availability of in person appointments. I also discussed with the patient that there may be a patient responsible charge related to this service. The patient expressed understanding and agreed to proceed.   History and Present Illness:  URI  This is a recurrent problem. The current episode started in the past 7 days. The problem has been gradually worsening. There has been no fever. Associated symptoms include congestion, coughing and headaches. Pertinent negatives include no nausea or rash. The treatment provided no relief.      Review of Systems  Constitutional: Negative for chills and fever.  HENT: Positive for congestion.   Respiratory: Positive for cough.   Gastrointestinal: Negative for nausea.  Skin: Negative for rash.  Neurological: Positive for headaches.  All other systems reviewed and are negative.    Observations/Objective: Televisit-patient did not sound to be in distress.  Assessment and Plan:  Take medication as prescribed Cool-mist humidifier Zyrtec 10 mg tablet daily Ocean Spray Prednisone taper.   Follow Up  Instructions: Follow-up with worsening or unresolved symptoms.   I discussed the assessment and treatment plan with the patient. The patient was provided an opportunity to ask questions and all were answered. The patient agreed with the plan and demonstrated an understanding of the instructions.   The patient was advised to call back or seek an in-person evaluation if the symptoms worsen or if the condition fails to improve as anticipated.  The above assessment and management plan was discussed with the patient. The patient verbalized understanding of and has agreed to the management plan. Patient is aware to call the clinic if symptoms persist or worsen. Patient is aware when to return to the clinic for a follow-up visit. Patient educated on when it is appropriate to go to the emergency department.   Time call ended: 4:39 PM I provided 9 minutes of  non face-to-face time during this encounter.    Ivy Lynn, NP

## 2021-04-15 NOTE — Assessment & Plan Note (Signed)
Take medication as prescribed Cool-mist humidifier Zyrtec 10 mg tablet daily Ocean Spray Prednisone taper.

## 2021-05-02 DIAGNOSIS — M79604 Pain in right leg: Secondary | ICD-10-CM | POA: Diagnosis not present

## 2021-05-02 DIAGNOSIS — K8020 Calculus of gallbladder without cholecystitis without obstruction: Secondary | ICD-10-CM | POA: Diagnosis not present

## 2021-05-02 DIAGNOSIS — M79661 Pain in right lower leg: Secondary | ICD-10-CM | POA: Diagnosis not present

## 2021-05-02 DIAGNOSIS — Z79899 Other long term (current) drug therapy: Secondary | ICD-10-CM | POA: Diagnosis not present

## 2021-05-02 DIAGNOSIS — K5900 Constipation, unspecified: Secondary | ICD-10-CM | POA: Diagnosis not present

## 2021-05-02 DIAGNOSIS — R1032 Left lower quadrant pain: Secondary | ICD-10-CM | POA: Diagnosis not present

## 2021-05-02 DIAGNOSIS — K5730 Diverticulosis of large intestine without perforation or abscess without bleeding: Secondary | ICD-10-CM | POA: Diagnosis not present

## 2021-05-02 DIAGNOSIS — Z888 Allergy status to other drugs, medicaments and biological substances status: Secondary | ICD-10-CM | POA: Diagnosis not present

## 2021-05-03 ENCOUNTER — Telehealth: Payer: Self-pay | Admitting: Family Medicine

## 2021-05-03 NOTE — Telephone Encounter (Signed)
Pt called and appt made for [facility]ED with Lilia Pro for ER f/u

## 2021-05-05 ENCOUNTER — Telehealth: Payer: Self-pay | Admitting: Family Medicine

## 2021-05-05 ENCOUNTER — Other Ambulatory Visit: Payer: Self-pay

## 2021-05-05 ENCOUNTER — Encounter: Payer: Self-pay | Admitting: Family Medicine

## 2021-05-05 ENCOUNTER — Ambulatory Visit (INDEPENDENT_AMBULATORY_CARE_PROVIDER_SITE_OTHER): Payer: Medicare HMO | Admitting: Family Medicine

## 2021-05-05 VITALS — BP 118/64 | HR 74 | Temp 97.6°F | Ht 66.0 in | Wt 153.1 lb

## 2021-05-05 DIAGNOSIS — G8929 Other chronic pain: Secondary | ICD-10-CM | POA: Diagnosis not present

## 2021-05-05 DIAGNOSIS — M25551 Pain in right hip: Secondary | ICD-10-CM

## 2021-05-05 DIAGNOSIS — R1032 Left lower quadrant pain: Secondary | ICD-10-CM | POA: Diagnosis not present

## 2021-05-05 DIAGNOSIS — K5909 Other constipation: Secondary | ICD-10-CM

## 2021-05-05 DIAGNOSIS — Z09 Encounter for follow-up examination after completed treatment for conditions other than malignant neoplasm: Secondary | ICD-10-CM

## 2021-05-05 NOTE — Progress Notes (Signed)
Established Patient Office Visit  Subjective:  Patient ID: Elizabeth Wu, female    DOB: 06/09/1951  Age: 70 y.o. MRN: [id number]  CC:  Chief Complaint  Patient presents with   Abdominal Pain   Leg Pain    HPI [name] presents for hospital follow up. She was seen in the ED 2 days ago for LLQ abdominal pain and right lower leg pain. She had a CT done that showed a small gall stones without evidence of cholecystitis and constipation. Labs were unremarkable. She has a prescription for linzess for chronic constipation but had not been taking this daily. She has been taking it since her ED visit and reports that she barely has any more abdominal pain. She has had 2 BMs since her ED visit. Denies vomiting, fever, chills, dysuria, urgency, frequency, or hematuria. She has had some mild intermittent nausea.   She was also seen for right hip pain that started in January. The pain radiates to her thigh and sometimes in her buttocks. The pain is intermittent. The pain is sharp. She is unsure what causes the pain. She isn't sure what makes the pain better. She denies injury or falls. She had tried therapy and a steroid shot without improvement. She was given tramadol in the ED. This helps but she is afraid to take it frequently. She also has tried ibuprofen with some improvement. CT of pelvis showed no acute abnormalities, MSK was normal except for osteopenia. She would like to see ortho.   Past Medical History:  Diagnosis Date   Allergy    DJD (degenerative joint disease)    Hyperlipidemia    Osteopenia     Past Surgical History:  Procedure Laterality Date   APPENDECTOMY     HERNIA REPAIR     TONSILLECTOMY     TONSILLECTOMY      Family History  Problem Relation Age of Onset   Alzheimer's disease Mother    Cancer Father        stomach cancer   Diabetes Sister    Heart disease Brother        MI    Social History   Socioeconomic History   Marital status: Single    Spouse name:  Not on file   Number of children: Not on file   Years of education: Not on file   Highest education level: 12th grade  Occupational History   Occupation: [employer]  Tobacco Use   Smoking status: Every Day    Packs/day: 0.50    Years: 40.00    Pack years: 20.00    Types: Cigarettes   Smokeless tobacco: Never  Vaping Use   Vaping Use: Never used  Substance and Sexual Activity   Alcohol use: No   Drug use: No   Sexual activity: Not on file  Other Topics Concern   Not on file  Social History Narrative   Not on file   Social Determinants of Health   Financial Resource Strain: Not on file  Food Insecurity: Not on file  Transportation Needs: Not on file  Physical Activity: Not on file  Stress: Not on file  Social Connections: Not on file  Intimate Partner Violence: Not on file    Outpatient Medications Prior to Visit  Medication Sig Dispense Refill   BOOSTRIX 5-2.5-18.5 LF-MCG/0.5 injection      cetirizine (ZYRTEC ALLERGY) 10 MG tablet Take 1 tablet (10 mg total) by mouth daily. 90 tablet 1   Cholecalciferol (VITAMIN D3) 50 MCG (  2000 UT) CAPS Take 1 capsule by mouth daily.     linaclotide (LINZESS) 290 MCG CAPS capsule Take 290 mcg by mouth daily before breakfast.     losartan (COZAAR) 50 MG tablet Take 1 tablet (50 mg total) by mouth daily. 90 tablet 3   SHINGRIX injection      simvastatin (ZOCOR) 20 MG tablet Take 1 tablet (20 mg total) by mouth at bedtime. 90 tablet 3   sodium chloride (OCEAN) 0.65 % SOLN nasal spray Place 1 spray into both nostrils as needed for congestion. 60 mL 1   traMADol (ULTRAM) 50 MG tablet Take by mouth.     ibuprofen (ADVIL) 600 MG tablet Take 1 tablet (600 mg total) by mouth 2 (two) times daily as needed for moderate pain. (Patient not taking: Reported on 05/05/2021) 90 tablet 1   predniSONE (STERAPRED UNI-PAK 21 TAB) 10 MG (21) TBPK tablet 6 tablets day 1, 5 tablets day 2, 4 tablet day 3, 3 tablet day 4, 2 tablet day 5, 1 tablet day 6. 21  tablet 0   No facility-administered medications prior to visit.    Allergies  Allergen Reactions   Acetaminophen Other (See Comments)    HTN    ROS Review of Systems As per HPI.    Objective:    Physical Exam Vitals and nursing note reviewed.  Constitutional:      General: She is not in acute distress.    Appearance: She is not ill-appearing, toxic-appearing or diaphoretic.  Cardiovascular:     Rate and Rhythm: Normal rate and regular rhythm.  Pulmonary:     Effort: Pulmonary effort is normal. No respiratory distress.  Abdominal:     General: Bowel sounds are normal. There is no distension.     Palpations: Abdomen is soft. There is no shifting dullness, mass or pulsatile mass.     Tenderness: There is no abdominal tenderness. There is no right CVA tenderness, left CVA tenderness, guarding or rebound. Negative signs include Murphy's sign and McBurney's sign.  Musculoskeletal:     Right hip: No deformity, tenderness or bony tenderness.  Neurological:     General: No focal deficit present.     Mental Status: She is alert and oriented to person, place, and time.  Psychiatric:        Mood and Affect: Mood normal.        Behavior: Behavior normal.    BP 118/64   Pulse 74   Temp 97.6 F (36.4 C) (Oral)   Ht _0  (1.676 m)   Wt 153 lb 2 oz (69.5 kg)   BMI 24.72 kg/m  Wt Readings from Last 3 Encounters:  05/05/21 153 lb 2 oz (69.5 kg)  03/01/21 159 lb 12.8 oz (72.5 kg)  02/02/21 162 lb (73.5 kg)     Health Maintenance Due  Topic Date Due   Zoster Vaccines- Shingrix (1 of 2) Never done   COVID-19 Vaccine (4 - Booster for Pfizer series) 01/17/2021    There are no preventive care reminders to display for this patient.  Lab Results  Component Value Date   TSH 2.530 05/20/2020   Lab Results  Component Value Date   WBC 7.4 05/20/2020   HGB 12.7 05/20/2020   HCT 37.3 05/20/2020   MCV 96 05/20/2020   PLT 376 05/20/2020   Lab Results  Component Value Date    NA 143 03/01/2021   K 4.8 03/01/2021   CO2 24 03/01/2021   GLUCOSE 95 03/01/2021  BUN 13 03/01/2021   CREATININE 0.91 03/01/2021   BILITOT 0.8 03/01/2021   ALKPHOS 126 (H) 03/01/2021   AST 15 03/01/2021   ALT 15 03/01/2021   PROT 7.3 03/01/2021   ALBUMIN 4.7 03/01/2021   CALCIUM 9.9 03/01/2021   EGFR 68 03/01/2021   Lab Results  Component Value Date   CHOL 186 03/01/2021   Lab Results  Component Value Date   HDL 48 03/01/2021   Lab Results  Component Value Date   LDLCALC 110 (H) 03/01/2021   Lab Results  Component Value Date   TRIG 157 (H) 03/01/2021   Lab Results  Component Value Date   CHOLHDL 3.9 03/01/2021   No results found for: HGBA1C    Assessment & Plan:   Hazelee was seen today for abdominal pain and leg pain.  Diagnoses and all orders for this visit:  Left lower quadrant abdominal pain CT in ED showed small gallstone without evidence of cholecystitis and constipation. Labs were unremarkable. Report improvement in symptoms with linzess and regular BMs. Exam benign today.   Chronic constipation Continue linzess, handout given.  Chronic right hip pain Recent normal imaging. Referral placed today per request. -     Ambulatory referral to [facility] discharge follow-up Reviewed ED record.    Follow-up: Return if symptoms worsen or fail to improve.   The patient indicates understanding of these issues and agrees with the plan.  Gwenlyn Perking, FNP

## 2021-05-05 NOTE — Telephone Encounter (Signed)
Pt called stating that she would like her referral to be sent to a Dr Lorin Mercy who is normally in [place] but comes to [place] from time to time. Says her sister recommended him.

## 2021-05-05 NOTE — Patient Instructions (Signed)
Chronic Constipation Chronic constipation is a condition in which a person has three or fewer bowel movements a week, for 3 months or longer. This condition is especially commonin older adults. What are the causes? Causes of chronic constipation may include: Not drinking enough fluid, eating enough food or fiber, or getting enough physical activity. Pregnancy. A tear in the anus (anal fissure). Blockage in the bowel (bowel obstruction). Narrowing of the bowel (bowel stricture). Having a long-term medical condition, such as: Diabetes, hypothyroidism, or iron-deficiency anemia. Stroke or spinal cord injury. Multiple sclerosis or Parkinson's disease. Colon cancer. Dementia. Inflammatory bowel disease (IBD), outward collapse of the rectum (rectal prolapse), or hemorrhoids. Taking certain medicines, including: Narcotics. These are a certain type of prescription pain medicine. Antacids or iron supplements. Water pills (diuretics). Certain blood pressure medicines. Anti-seizure medicines. Antidepressants. Medicines for Parkinson's disease. Other causes of this condition may include: Stress. Problems in the nerves and muscles that control the movement of stool. Weak or impaired pelvic floor muscles. What increases the risk? You may be at higher risk for chronic constipation if: You are older than age 88. You are female. You live in a long-term care facility. You have a long-term disease. You have a mental health disorder or eating disorder. What are the signs or symptoms? The main symptom of chronic constipation is having three or fewer bowel movements a week for several weeks. Other signs and symptoms may vary from person to person. These include: Pushing hard (straining) to pass stool, or having hard or lumpy stools. Painful bowel movements. Having lower abdominal discomfort, such as cramps or bloating. Being unable to have a bowel movement when you feel the urge, or feeling like  you still need to pass stool after a bowel movement. Feeling that you have something in your rectum that is blocking or preventing bowel movements. Seeing blood on the toilet paper or in your stool. Worsening confusion (in older adults). How is this diagnosed? This condition may be diagnosed based on: Your symptoms and medical history. You will be asked about your symptoms, lifestyle, diet, and any medicines that you are taking. A physical exam. Your abdomen will be examined. A digital rectal exam may be done. For this exam, a health care provider places a lubricated, gloved finger into the rectum. Tests to check for any underlying causes of your constipation. These may be ordered if you have bleeding in your rectum, weight loss, or a family history of colon cancer. In these cases, you may have: Imaging studies of the colon. These may include X-ray, ultrasound, or a CT scan. Blood tests. A procedure to examine the inside of your colon (colonoscopy). More specialized tests to check: Whether your anal sphincter works well. This is a ring-shaped muscle that controls the closing of the anus. How well food moves through your colon. Tests to measure the nerve signal in your pelvic floor muscles (electromyography). How is this treated? Treatment for chronic constipation depends on the cause. Most often, treatment starts with: Being more active and getting regular exercise. Drinking more fluids. Adding fiber to your diet. Sources of fiber include fruits, vegetables, whole grains, and fiber supplements. Using medicines such as stool softeners or medicines that increase contractions in your digestive system (pro-motility agents). Training your pelvic muscles with biofeedback. Surgery, if there is obstruction. Treatment may also include: Stopping or changing some medicines if they cause constipation. Using a fiber supplement (bulk laxative) or stool softener. Using a prescription laxative. This  works by [employer]  into your colon (osmotic laxative). You may also need to see a specialist who treats conditions of the digestive system (gastroenterologist). Follow these instructions at home: Medicines Take over-the-counter and prescription medicines only as told by your health care provider. If you are taking a laxative, take it as told by your health care provider. Eating and drinking  Eat a balanced diet that includes enough fiber. Ask your health care provider to recommend a diet that is right for you. Drink clear fluids, especially water. Avoid drinking alcohol, caffeine, and soda. These can make constipation worse. Drink enough fluid to keep your urine pale yellow.  General instructions Get some physical activity every day. Ask your health care provider what activities are safe for you. Get colon cancer screenings as told by your health care provider. Keep all follow-up visits as told by your health care provider. This is important. Contact a health care provider if you have: Three or fewer bowel movements a week. Stools that are hard or lumpy. Blood on the toilet paper or in your stool after you have a bowel movement. Unexplained weight loss. Rectum (rectal) pain. Stool leakage. Nausea or vomiting. Get help right away if you have: Rectal bleeding or you pass blood clots. Severe rectal pain. Body tissue that pushes out (protrudes) from your anus. Severe pain or bloating (distension) in your abdomen. Vomiting that you cannot control. Summary Chronic constipation is a condition in which a person has three or fewer bowel movements a week, for 3 months or longer. You may have a higher risk for this condition if you are an older adult, you are female, or you have a long-term disease. Treatment for this condition depends on the cause. Most treatments for chronic constipation include adding fiber to your diet, drinking more fluids, and getting more physical activity. You may  also need to treat any underlying medical conditions or stop or change certain medicines if they cause constipation. If lifestyle changes do not relieve constipation, your health care provider may recommend taking a laxative. This information is not intended to replace advice given to you by your health care provider. Make sure you discuss any questions you have with your healthcare provider. Document Revised: 09/25/2019 Document Reviewed: 09/25/2019 Elsevier Patient Education  [place].

## 2021-05-13 ENCOUNTER — Ambulatory Visit (INDEPENDENT_AMBULATORY_CARE_PROVIDER_SITE_OTHER): Payer: Medicare HMO | Admitting: Orthopaedic Surgery

## 2021-05-13 ENCOUNTER — Other Ambulatory Visit: Payer: Self-pay

## 2021-05-13 ENCOUNTER — Encounter: Payer: Self-pay | Admitting: Orthopaedic Surgery

## 2021-05-13 VITALS — Ht 65.0 in | Wt 155.0 lb

## 2021-05-13 DIAGNOSIS — M25551 Pain in right hip: Secondary | ICD-10-CM

## 2021-05-13 NOTE — Progress Notes (Signed)
Office Visit Note   Patient: Elizabeth Wu           Date of Birth: 25-Jan-1951           MRN: [id number] Visit Date: 05/13/2021              Requested by: Gwenlyn Perking, [place],  [place] [id number] PCP: [name], DO   Assessment & Plan: Visit Diagnoses:  1. Pain in right hip     Plan: Plan radiographs are negative in January.  Would recommend proceeding with an MRI scan she may have a labral tear or tendinopathy about the hip with her sharp recurrent episodic pain which is caused her to fall and rated 10 out of 10.  Office follow-up after MRI scan.  Follow-Up Instructions: No follow-ups on file.   Orders:  Orders Placed This Encounter  Procedures   MR Hip Right w/o contrast   No orders of the defined types were placed in this encounter.     Procedures: No procedures performed   Clinical Data: No additional findings.   Subjective: Chief Complaint  Patient presents with   Lower Back - Pain   Right Hip - Pain    HPI 70 year old female new patient visit with chronic right hip pain referred by Dr. Lajuana Ripple.  Patient has intermittent sharp pain that she rates at 10 out of 10 requires her to hold still sometimes for 20 seconds sometimes for 5 to 10 minutes where she does not build to move or continue walking.  It occurs when she turns or twists does not occur when she is laying down or sitting.  She has been on Naprosyn, gabapentin without relief.  She has been through physical therapy and felt that it was actually making it worse and not helping.  She has pain in her right groin right buttocks radiates down to her knee rarely past the knee.  When it hit she states it feels almost knifelike and she has to immediately stop and hold still.  She has had an episode of giving way of the leg but fortunately fell on the couch.  No fever chills no history of gout no associated bowel or bladder symptoms.  Patient lives with her significant other.  She has  had a normal bone density.  Lumbar spine 11/30/2020 showed large gallstone otherwise normal.  Hip x-rays were normal.  She was seen in the ER and had CT scan abdomen pelvis which again showed a large gallstone.  Review of Systems previous appendectomy tonsillectomy.  Patient has some hypertension high cholesterol takes medication.  Scarlet fever remote history smokes 1/2 pack/day rare consumption of alcohol.  All other systems noncontributory.   Objective: Vital Signs: Ht 5\' 5"  (1.651 m)   Wt 155 lb (70.3 kg)   BMI 25.79 kg/m   Physical Exam Constitutional:      Appearance: She is well-developed.  HENT:     Head: Normocephalic.     Right Ear: External ear normal.     Left Ear: External ear normal. There is no impacted cerumen.  Eyes:     Pupils: Pupils are equal, round, and reactive to light.  Neck:     Thyroid: No thyromegaly.     Trachea: No tracheal deviation.  Cardiovascular:     Rate and Rhythm: Normal rate.  Pulmonary:     Effort: Pulmonary effort is normal.  Abdominal:     Palpations: Abdomen is soft.  Musculoskeletal:  Cervical back: No rigidity.  Skin:    General: Skin is warm and dry.  Neurological:     Mental Status: She is alert and oriented to person, place, and time.  Psychiatric:        Behavior: Behavior normal.    Ortho Exam patient has some discomfort with internal rotation of her hip and is not able to figure 4 on the right but can do on the left without pain.  Negative straight leg raising sitting and supine position.  Knee exam is normal.  Some tenderness of the groin no palpable lymphadenopathy.  Mild tenderness over the trochanteric bursa.  She is moderately tender over the sciatic notch on the right side only.  Some pain with resisted hip flexion.  She ambulates with a mild hip limp and with turning and twisting in exam room she had an episode of sharp pain had to stop hold still for 10 to 20 seconds where she is able to start moving again and points  to the right groin.  Anterior tib EHL gastrocsoleus is strong symmetrical no atrophy.  Specialty Comments:  No specialty comments available.  Imaging: No results found.   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   Hyperlipidemia 05/31/2013   Osteopenia of multiple sites 05/31/2013   DJD (degenerative joint disease) 05/31/2013   Past Medical History:  Diagnosis Date   Allergy    DJD (degenerative joint disease)    Hyperlipidemia    Osteopenia     Family History  Problem Relation Age of Onset   Alzheimer's disease Mother    Cancer Father        stomach cancer   Diabetes Sister    Heart disease Brother        MI    Past Surgical History:  Procedure Laterality Date   APPENDECTOMY     HERNIA REPAIR     TONSILLECTOMY     TONSILLECTOMY     Social History   Occupational History   Occupation: Sales executive  Tobacco Use   Smoking status: Every Day    Packs/day: 0.50    Years: 40.00    Pack years: 20.00    Types: Cigarettes   Smokeless tobacco: Never  Vaping Use   Vaping Use: Never used  Substance and Sexual Activity   Alcohol use: No   Drug use: No   Sexual activity: Not on file

## 2021-05-31 ENCOUNTER — Other Ambulatory Visit: Payer: Self-pay

## 2021-05-31 ENCOUNTER — Ambulatory Visit (HOSPITAL_COMMUNITY)
Admission: RE | Admit: 2021-05-31 | Discharge: 2021-05-31 | Disposition: A | Discharge status: Home / Self Care | Payer: Medicare HMO | Source: Ambulatory Visit | Attending: Orthopaedic Surgery | Admitting: Orthopaedic Surgery

## 2021-05-31 DIAGNOSIS — M25551 Pain in right hip: Secondary | ICD-10-CM | POA: Insufficient documentation

## 2021-05-31 DIAGNOSIS — M1611 Unilateral primary osteoarthritis, right hip: Secondary | ICD-10-CM | POA: Diagnosis not present

## 2021-05-31 DIAGNOSIS — M24151 Other articular cartilage disorders, right hip: Secondary | ICD-10-CM | POA: Diagnosis not present

## 2021-05-31 DIAGNOSIS — S32401A Unspecified fracture of right acetabulum, initial encounter for closed fracture: Secondary | ICD-10-CM | POA: Diagnosis not present

## 2021-05-31 IMAGING — MR MR HIP*R* W/O CM
5 series · 36 of 40 positions shown · non-contrast
Comparison: X-ray [DATE]

CLINICAL DATA: Right hip pain for 3 months.  No known injury.

EXAM:
MR OF THE RIGHT HIP WITHOUT CONTRAST
TECHNIQUE: Multiplanar, multisequence MR imaging was performed. No intravenous
contrast was administered.

[Series 8: T1 · coronal · right · 4.0mm · 0.74mm/px · 8 of 30 slices shown]
[im 1/30]
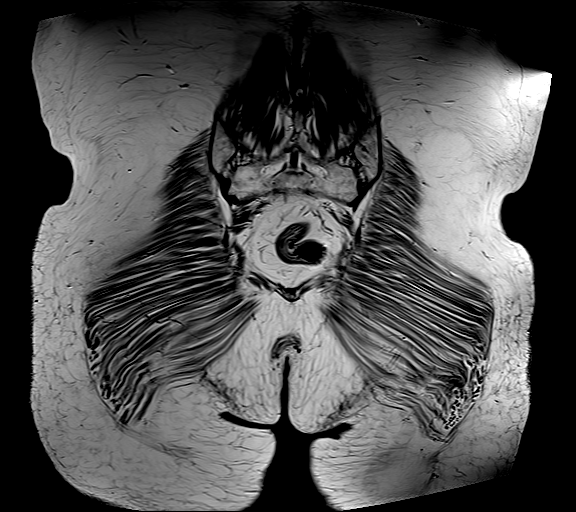
[im 5/30]
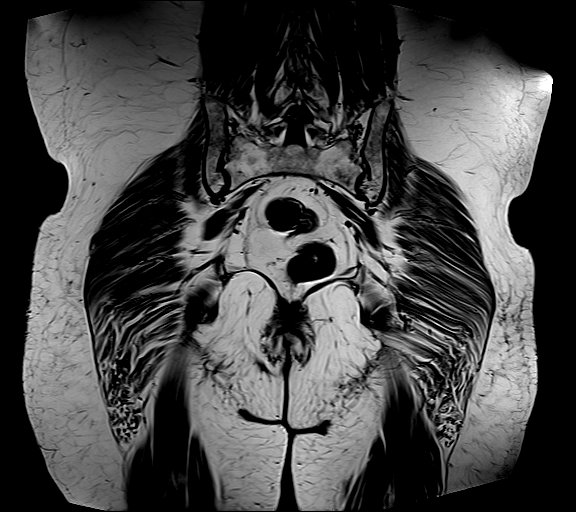
[im 9/30]
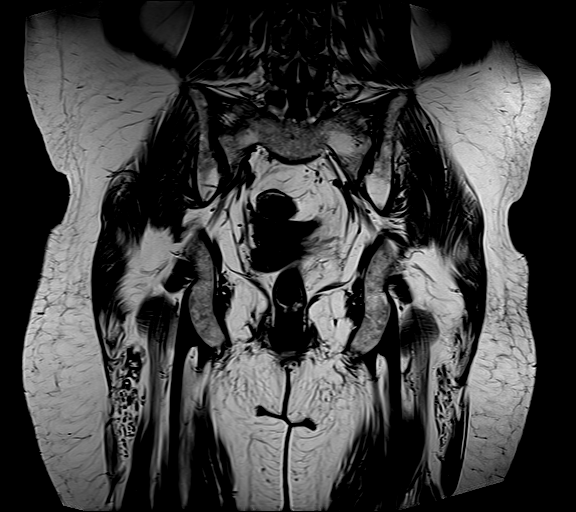
[im 13/30]
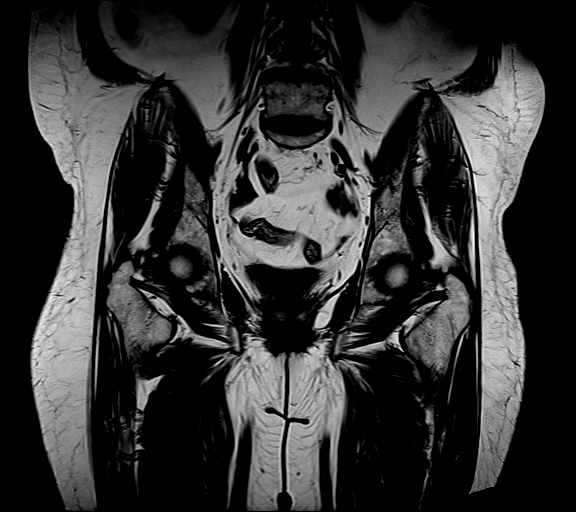
[im 17/30]
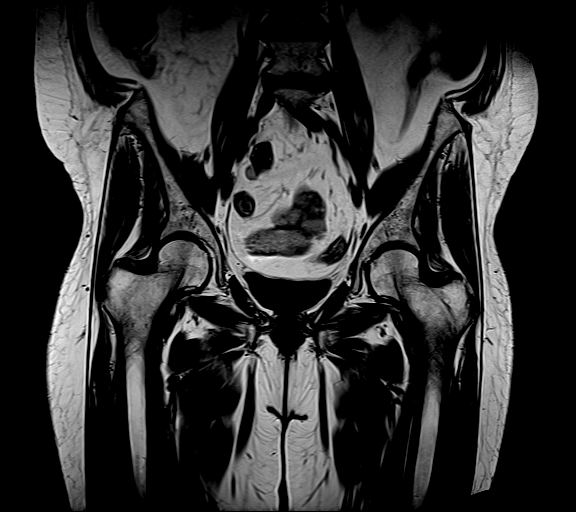
[im 21/30]
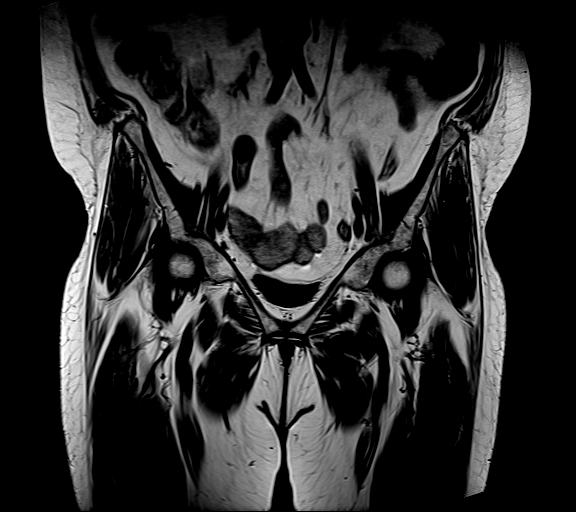
[im 25/30]
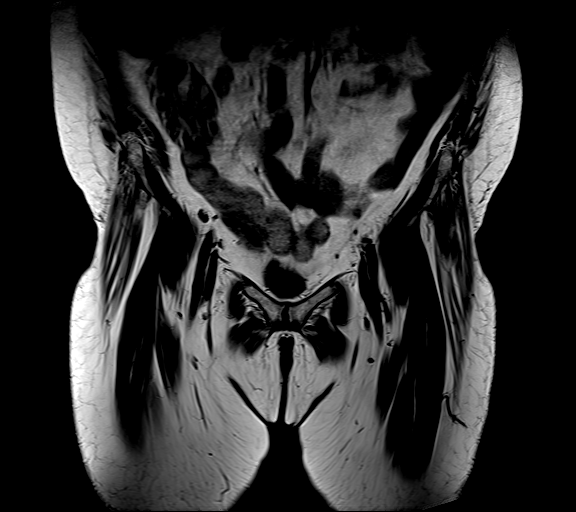
[im 30/30]
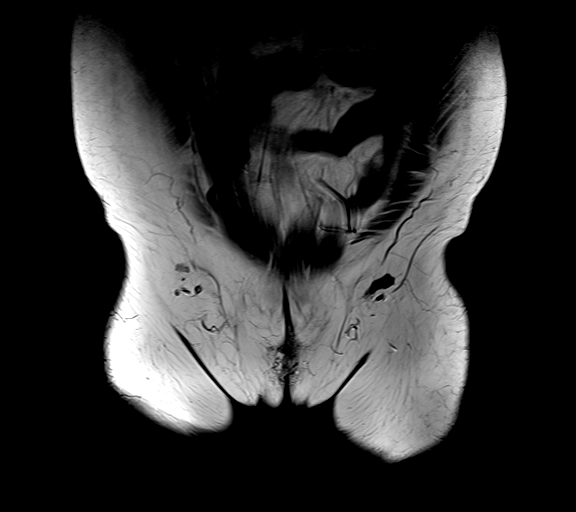

[Series 9: STIR · coronal · right · 4.0mm · 0.99mm/px · 7 of 30 slices shown]
[im 1/30]
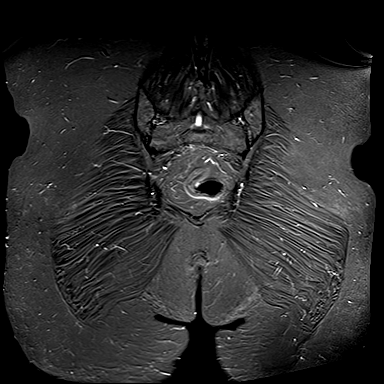
[im 4/30]
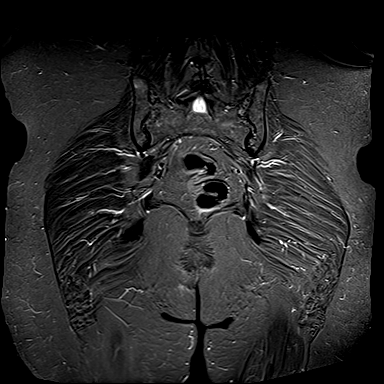
[im 8/30]
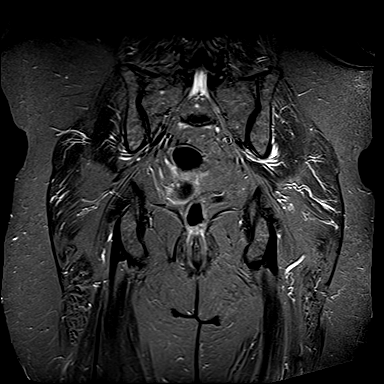
[im 11/30]
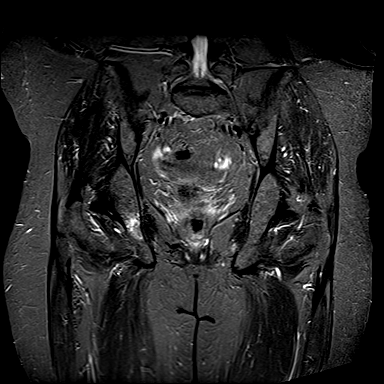
[im 19/30]
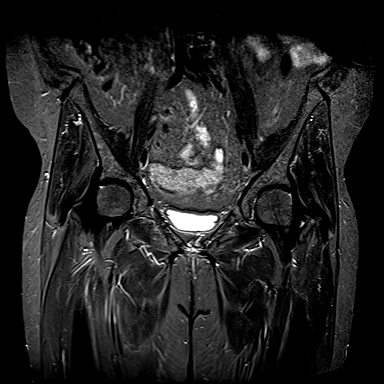
[im 22/30]
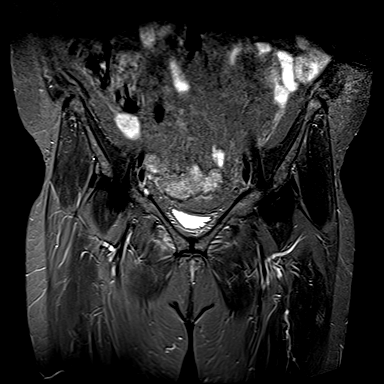
[im 26/30]
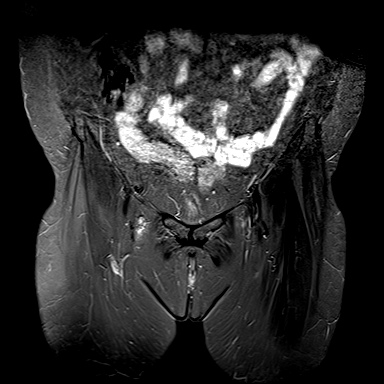

[Series 10: T2 fat-sat · axial · right · 4.0mm · 1.61mm/px · z∈[-72,+98]mm · 8 of 35 slices shown]
[im 1/35]
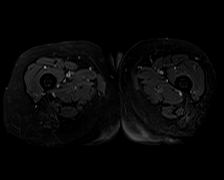
[im 4/35]
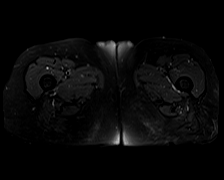
[im 12/35]
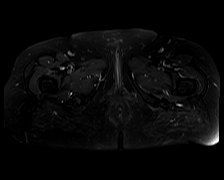
[im 16/35]
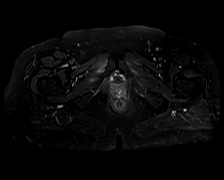
[im 19/35]
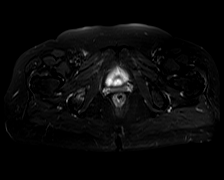
[im 23/35]
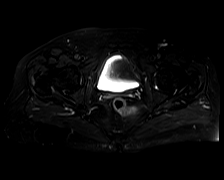
[im 31/35]
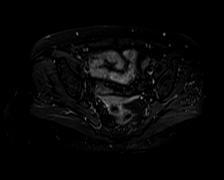
[im 35/35]
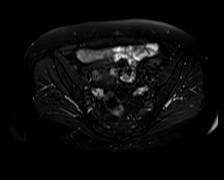

[Series 11: PD fat-sat · sagittal · right · 4.0mm · 0.70mm/px · 7 of 25 slices shown (1 of 2)]
[im 1/25]
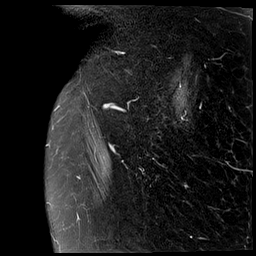
[im 5/25]
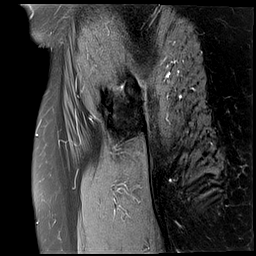
[im 9/25]
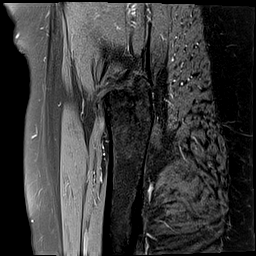
[im 13/25]
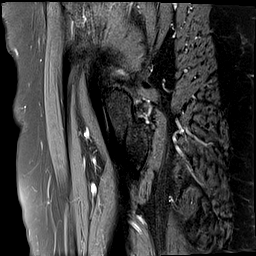
[im 17/25]
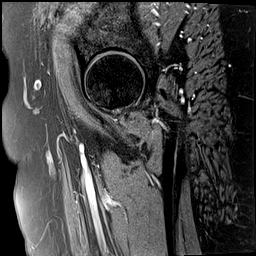
[im 21/25]
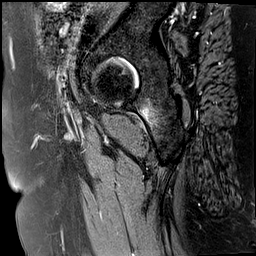
[im 25/25]
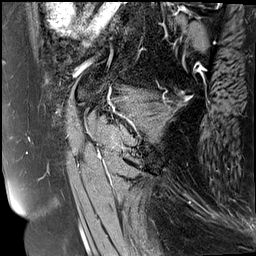

[Series 12: PD fat-sat · coronal · right · 4.0mm · 0.70mm/px · 6 of 20 slices shown (2 of 2)]
[im 1/20]
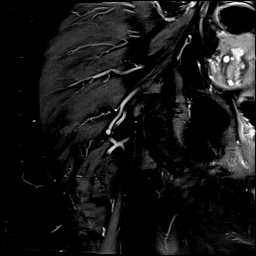
[im 4/20]
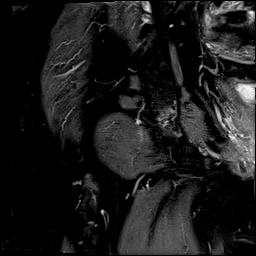
[im 8/20]
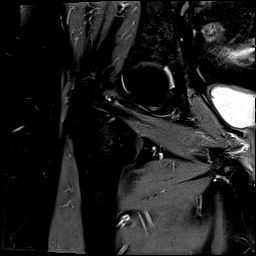
[im 12/20]
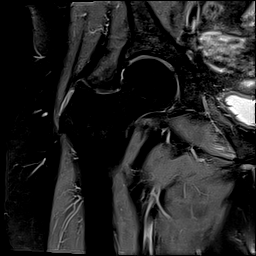
[im 16/20]
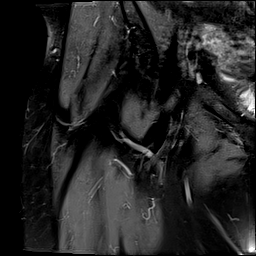
[im 20/20]
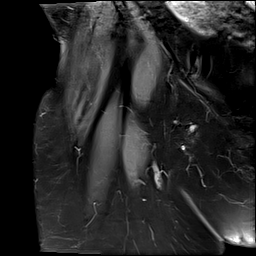

[36 of 40 positions shown; findings below may reference images not displayed]

FINDINGS: Bones: Nondisplaced fracture located at the junction of the
posterior acetabulum and ischial tuberosity on the right (series 10,
images 15-18; series 12, images 16-18). There is adjacent bone
marrow edema. No evidence of an underlying marrow replacing lesion
at this location to suggest a pathologic fracture. No additional
pelvic fractures were sites of bone marrow edema are identified.

Bilateral hips are intact without fracture, dislocation, or femoral
head avascular necrosis. Bilateral SI joints and pubic symphysis are
intact without significant arthropathy. No suspicious marrow
replacing bone lesion.

Articular cartilage and labrum

Articular cartilage: Mild chondral thinning of the right hip joint
posteriorly. No focal cartilage defect.

Labrum:  Grossly intact.  No paralabral cyst.

Joint or bursal effusion

Joint effusion:  No hip joint effusion or evidence of synovitis.

Bursae: No abnormal bursal fluid collection.

Muscles and tendons

Muscles and tendons: The gluteal, hamstring, iliopsoas, rectus
femoris, and adductor tendons appear intact without tear or
significant tendinosis. Mild intramuscular edema within the proximal
bilateral adductor compartments, right slightly greater than left.
Otherwise, normal muscle bulk and signal intensity without edema,
atrophy, or fatty infiltration.

Other findings

Miscellaneous: No soft tissue edema or fluid collection. No inguinal
lymphadenopathy. No acute findings are seen within the pelvis.
IMPRESSION: 1. Acute-to-subacute nondisplaced fracture located at the junction
of the posterior acetabulum and ischial tuberosity on the right.
Location would be atypical for a insufficiency fracture.
2. Mild intramuscular edema within the proximal bilateral adductor
compartments, right slightly greater than left, which may be
reactive or reflect mild muscle strain.
3. Minimal osteoarthritis of the right hip without joint effusion or
evidence of synovitis.

These results will be called to the ordering clinician or
representative by the Radiologist Assistant, and communication
documented in the PACS or [REDACTED].

## 2021-06-01 ENCOUNTER — Telehealth: Payer: Self-pay | Admitting: Radiology

## 2021-06-01 NOTE — Telephone Encounter (Signed)
Received call report from [facility] with [facility]:  IMPRESSION: 1. Acute-to-subacute nondisplaced fracture located at the junction of the posterior acetabulum and ischial tuberosity on the right. Location would be atypical for a insufficiency fracture. 2. Mild intramuscular edema within the proximal bilateral adductor compartments, right slightly greater than left, which may be reactive or reflect mild muscle strain. 3. Minimal osteoarthritis of the right hip without joint effusion or evidence of synovitis.

## 2021-06-01 NOTE — Telephone Encounter (Signed)
noted 

## 2021-06-03 ENCOUNTER — Other Ambulatory Visit: Payer: Self-pay

## 2021-06-03 ENCOUNTER — Ambulatory Visit: Payer: Self-pay

## 2021-06-03 ENCOUNTER — Encounter: Payer: Self-pay | Admitting: Orthopaedic Surgery

## 2021-06-03 ENCOUNTER — Ambulatory Visit (INDEPENDENT_AMBULATORY_CARE_PROVIDER_SITE_OTHER): Payer: Medicare HMO | Admitting: Orthopaedic Surgery

## 2021-06-03 VITALS — Ht 65.0 in | Wt 155.0 lb

## 2021-06-03 DIAGNOSIS — S32691A Other specified fracture of right ischium, initial encounter for closed fracture: Secondary | ICD-10-CM

## 2021-06-03 DIAGNOSIS — M25559 Pain in unspecified hip: Secondary | ICD-10-CM

## 2021-06-03 DIAGNOSIS — M25551 Pain in right hip: Secondary | ICD-10-CM

## 2021-06-03 DIAGNOSIS — S329XXA Fracture of unspecified parts of lumbosacral spine and pelvis, initial encounter for closed fracture: Secondary | ICD-10-CM | POA: Insufficient documentation

## 2021-06-03 NOTE — Progress Notes (Signed)
Office Visit Note   Patient: Elizabeth Wu           Date of Birth: February 17, 1951           MRN: [id number] Visit Date: 06/03/2021              Requested by: [name], DO [place],   [id number] PCP: [name], DO   Assessment & Plan: Visit Diagnoses:  1. Hip pain   2. Pain in right hip   3. Other closed fracture of right ischium, initial encounter [facility])     Plan: Patient has abductor strain and pelvic fracture between acetabulum and ischial tuberosity subacute.  She needs folding walker prescription written she can get this in [facility].  Recheck 5 weeks.  She will touchdown weight-bear we discussed her symptoms should resolve.  Follow-Up Instructions: Return in about 5 weeks (around 07/08/2021).   Orders:  Orders Placed This Encounter  Procedures   XR Pelvis 1-2 Views   No orders of the defined types were placed in this encounter.     Procedures: No procedures performed   Clinical Data: No additional findings.   Subjective: Chief Complaint  Patient presents with   Right Hip - Pain, Follow-up    MRI review    HPI 70 year old female returns for continued problems with right hip.  MRI scan is reviewed which showed acute subacute nondisplaced fracture at the junction of the posterior acetabulum and ischial tuberosity with adjacent mild intramuscular edema proximal bilateral abductor compartments right greater than left.  She purchased a rolling walker with reverse seat with a friend was pushing a like a grocery cart.  Onset of symptoms was gradual she had been working with exercise therapist.  Review of Systems no chills or fever no history of falls.   Objective: Vital Signs: Ht 5\' 5"  (1.651 m)   Wt 155 lb (70.3 kg)   BMI 25.79 kg/m   Physical Exam Constitutional:      Appearance: She is well-developed.  HENT:     Head: Normocephalic.     Right Ear: External ear normal.     Left Ear: External ear normal. There  is no impacted cerumen.  Eyes:     Pupils: Pupils are equal, round, and reactive to light.  Neck:     Thyroid: No thyromegaly.     Trachea: No tracheal deviation.  Cardiovascular:     Rate and Rhythm: Normal rate.  Pulmonary:     Effort: Pulmonary effort is normal.  Abdominal:     Palpations: Abdomen is soft.  Musculoskeletal:     Cervical back: No rigidity.  Skin:    General: Skin is warm and dry.  Neurological:     Mental Status: She is alert and oriented to person, place, and time.  Psychiatric:        Behavior: Behavior normal.    Ortho Exam pain with resisted abduction tenderness between greater trochanter and ischial tuberosity.  Negative straight leg raising 90 degrees.  Anterior tib EHL is intact.  Specialty Comments:  No specialty comments available.  Imaging: CLINICAL DATA:  Right hip pain for 3 months.  No known injury.   EXAM: MR OF THE RIGHT HIP WITHOUT CONTRAST   TECHNIQUE: Multiplanar, multisequence MR imaging was performed. No intravenous contrast was administered.   COMPARISON:  X-ray 11/30/2020   FINDINGS: Bones: Nondisplaced fracture located at the junction of the posterior acetabulum and ischial tuberosity on the right (series  10, images 15-18; series 12, images 16-18). There is adjacent bone marrow edema. No evidence of an underlying marrow replacing lesion at this location to suggest a pathologic fracture. No additional pelvic fractures were sites of bone marrow edema are identified.   Bilateral hips are intact without fracture, dislocation, or femoral head avascular necrosis. Bilateral SI joints and pubic symphysis are intact without significant arthropathy. No suspicious marrow replacing bone lesion.   Articular cartilage and labrum   Articular cartilage: Mild chondral thinning of the right hip joint posteriorly. No focal cartilage defect.   Labrum:  Grossly intact.  No paralabral cyst.   Joint or bursal effusion   Joint effusion:   No hip joint effusion or evidence of synovitis.   Bursae: No abnormal bursal fluid collection.   Muscles and tendons   Muscles and tendons: The gluteal, hamstring, iliopsoas, rectus femoris, and adductor tendons appear intact without tear or significant tendinosis. Mild intramuscular edema within the proximal bilateral adductor compartments, right slightly greater than left. Otherwise, normal muscle bulk and signal intensity without edema, atrophy, or fatty infiltration.   Other findings   Miscellaneous: No soft tissue edema or fluid collection. No inguinal lymphadenopathy. No acute findings are seen within the pelvis.   IMPRESSION: 1. Acute-to-subacute nondisplaced fracture located at the junction of the posterior acetabulum and ischial tuberosity on the right. Location would be atypical for a insufficiency fracture. 2. Mild intramuscular edema within the proximal bilateral adductor compartments, right slightly greater than left, which may be reactive or reflect mild muscle strain. 3. Minimal osteoarthritis of the right hip without joint effusion or evidence of synovitis.   These results will be called to the ordering clinician or representative by the Radiologist Assistant, and communication documented in the PACS or [employer].     Electronically Signed   By: Davina Poke D.O.   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   Hyperlipidemia 05/31/2013   Osteopenia of multiple sites 05/31/2013   DJD (degenerative joint disease) 05/31/2013   Past Medical History:  Diagnosis Date   Allergy    DJD  (degenerative joint disease)    Hyperlipidemia    Osteopenia     Family History  Problem Relation Age of Onset   Alzheimer's disease Mother    Cancer Father        stomach cancer   Diabetes Sister    Heart disease Brother        MI    Past Surgical History:  Procedure Laterality Date   APPENDECTOMY     HERNIA REPAIR     TONSILLECTOMY     TONSILLECTOMY     Social History   Occupational History   Occupation: Sales executive  Tobacco Use   Smoking status: Every Day    Packs/day: 0.50    Years: 40.00    Pack years: 20.00    Types: Cigarettes   Smokeless tobacco: Never  Vaping Use   Vaping Use: Never used  Substance and Sexual Activity   Alcohol use: No   Drug use: No   Sexual activity: Not on file

## 2021-06-30 ENCOUNTER — Encounter: Payer: Self-pay | Admitting: Family Medicine

## 2021-06-30 ENCOUNTER — Other Ambulatory Visit: Payer: Self-pay

## 2021-06-30 ENCOUNTER — Ambulatory Visit (INDEPENDENT_AMBULATORY_CARE_PROVIDER_SITE_OTHER): Payer: Medicare HMO

## 2021-06-30 ENCOUNTER — Ambulatory Visit (INDEPENDENT_AMBULATORY_CARE_PROVIDER_SITE_OTHER): Payer: Medicare HMO | Admitting: Family Medicine

## 2021-06-30 VITALS — BP 115/65 | HR 77 | Temp 98.1°F | Ht 65.0 in | Wt 152.6 lb

## 2021-06-30 DIAGNOSIS — R519 Headache, unspecified: Secondary | ICD-10-CM | POA: Diagnosis not present

## 2021-06-30 DIAGNOSIS — R053 Chronic cough: Secondary | ICD-10-CM

## 2021-06-30 DIAGNOSIS — Z720 Tobacco use: Secondary | ICD-10-CM | POA: Diagnosis not present

## 2021-06-30 DIAGNOSIS — M25551 Pain in right hip: Secondary | ICD-10-CM

## 2021-06-30 DIAGNOSIS — E782 Mixed hyperlipidemia: Secondary | ICD-10-CM | POA: Diagnosis not present

## 2021-06-30 DIAGNOSIS — G8929 Other chronic pain: Secondary | ICD-10-CM | POA: Diagnosis not present

## 2021-06-30 IMAGING — DX DG CHEST 2V
2 series · 2 of 2 positions shown · non-contrast
Comparison: [DATE]

CLINICAL DATA: Chronic cough.

EXAM:
CHEST - 2 VIEW

[chest pa]
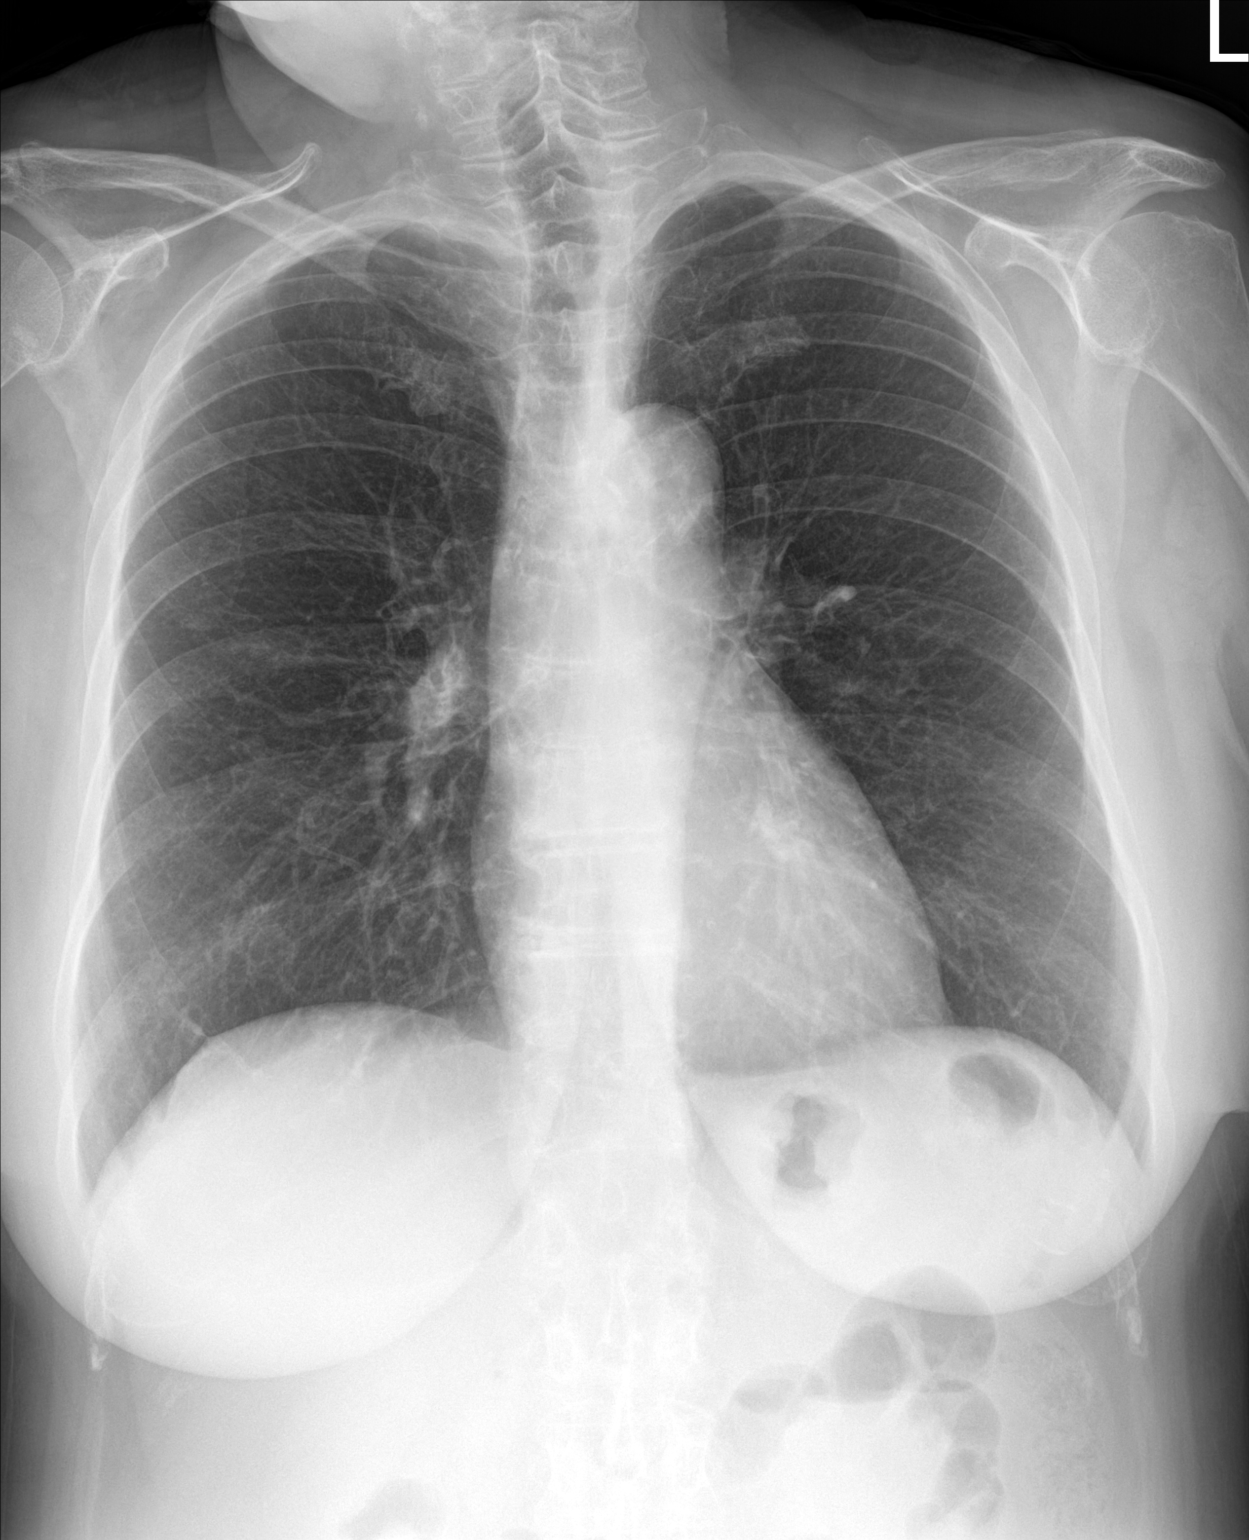

[chest lat]
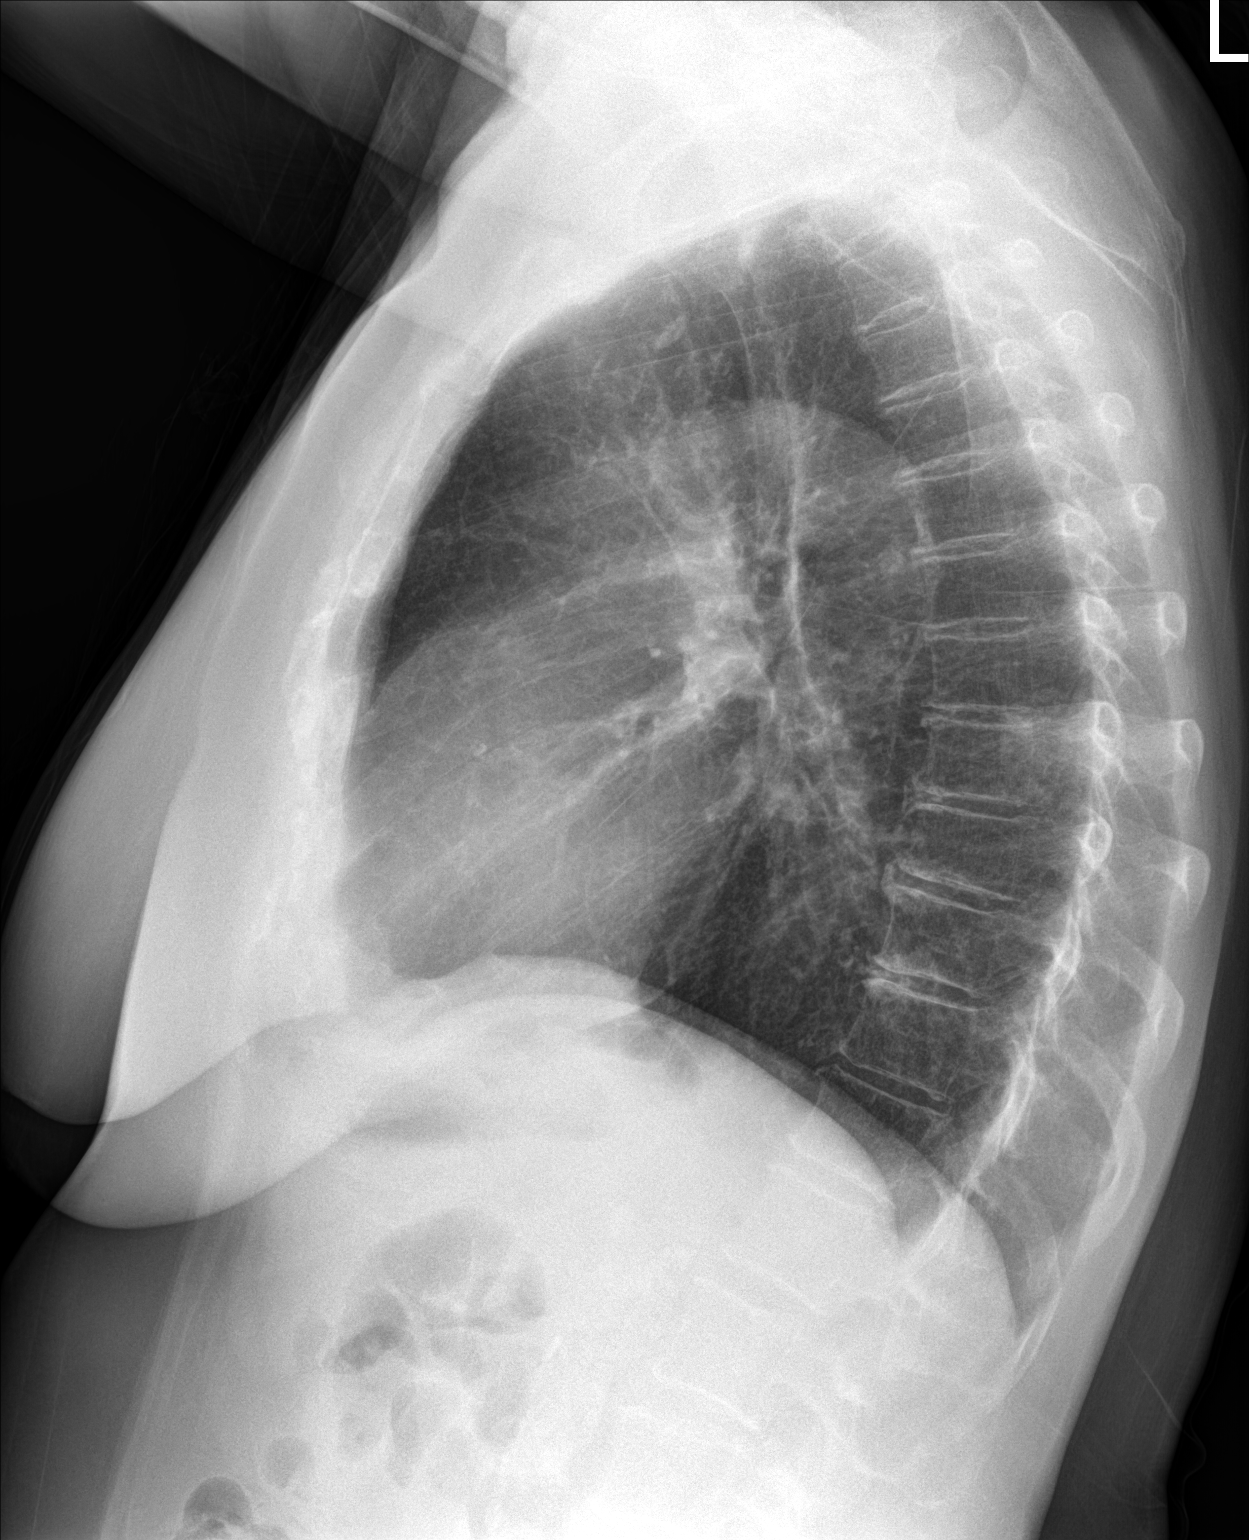

[2 of 2 positions shown; findings below may reference images not displayed]

FINDINGS: Both lungs are clear. Heart and mediastinum are within normal
limits. Negative for a pneumothorax. Trachea is midline. No pleural
effusions. Mild degenerative changes in thoracic spine.
IMPRESSION: No active cardiopulmonary disease.

## 2021-06-30 MED ORDER — AZELASTINE HCL 0.1 % NA SOLN: 1.0000 | Freq: Two times a day (BID) | NASAL | 12 refills | Status: DC

## 2021-06-30 NOTE — Patient Instructions (Signed)
Use astelin nasal spray if Cetirizine does not help the runny nose at night  Drink water to thin phlegm  You had labs performed today.  You will be contacted with the results of the labs once they are available, usually in the next 3 business days for routine lab work.  If you have an active my chart account, they will be released to your MyChart.  If you prefer to have these labs released to you via telephone, please let us know.  If you had a pap smear or biopsy performed, expect to be contacted in about 7-10 days.  Advil and Gabapentin ok for pain.  Trigeminal Neuralgia  Trigeminal neuralgia is a nerve disorder that causes severe pain on one side of the face. The pain may last from a few seconds to several minutes. The pain isusually only on one side of the face. Symptoms may occur for days, weeks, or months and then go away for months oryears. The pain may return and be worse than before. What are the causes? This condition is caused by damage or pressure to a nerve in the head that is called the trigeminal nerve. An attack can be triggered by: Talking. Chewing. Putting on makeup. Washing your face. Shaving your face. Brushing your teeth. Touching your face. What increases the risk? You are more likely to develop this condition if you: Are 52 years of age or older. Are female. What are the signs or symptoms? The main symptom of this condition is severe pain in the: Jaw. Lips. Eyes. Nose. Scalp. Forehead. Face. The pain may be: Intense. Stabbing. Electric. Shock-like. How is this diagnosed? This condition is diagnosed with a physical exam. A CT scan or an MRI may bedone to rule out other conditions that can cause facial pain. How is this treated? This condition may be treated with: Avoiding the things that trigger your symptoms. Taking prescription medicines (anticonvulsants). Having surgery. This may be done in severe cases if other medical treatment does not  provide relief. Having procedures such as ablation, thermal, or radiation therapy. It may take up to one month for treatment to start relieving the pain. Follow these instructions at home: Managing pain Learn as much as you can about how to manage your pain. Ask your health care provider if a pain specialist would be helpful. Consider talking with a mental health care provider (psychologist) about how to cope with the pain. Consider joining a pain support group. General instructions Take over-the-counter and prescription medicines only as told by your health care provider. Avoid the things that trigger your symptoms. It may help to: Chew on the unaffected side of your mouth. Avoid touching your face. Avoid blasts of hot or cold air. Follow your treatment plan as told by your health care provider. This may include: Cognitive or behavioral therapy. Gentle, regular exercise. Meditation or yoga. Aromatherapy. Keep all follow-up visits as told by your health care provider. You may need to be monitored closely to make sure treatment is working well for you. Where to find more information [employer] Association: fpa-support.org Contact a health care provider if: Your medicine is not helping your symptoms. You have side effects from the medicine used for treatment. You develop new, unexplained symptoms, such as: Double vision. Facial weakness. Facial numbness. Changes in hearing or balance. You feel depressed. Get help right away if: Your pain is severe and is not getting better. You develop suicidal thoughts. If you ever feel like you may hurt yourself or  others, or have thoughts about taking your own life, get help right away. You can go to your nearest emergency department or call: Your local emergency services (911 in the [place] A suicide crisis helpline, such as the [place] at 930-151-3367. This is open 24 hours a day. Summary Trigeminal neuralgia is  a nerve disorder that causes severe pain on one side of the face. The pain may last from a few seconds to several minutes. This condition is caused by damage or pressure to a nerve in the head that is called the trigeminal nerve. Treatment may include avoiding the things that trigger your symptoms, taking medicines, or having surgery or procedures. It may take up to one month for treatment to start relieving the pain. Avoid the things that trigger your symptoms. Keep all follow-up visits as told by your health care provider. You may need to be monitored closely to make sure treatment is working well for you. This information is not intended to replace advice given to you by your health care provider. Make sure you discuss any questions you have with your healthcare provider. Document Revised: 09/24/2018 Document Reviewed: 09/24/2018 Elsevier Patient Education  2022 [employer].

## 2021-06-30 NOTE — Progress Notes (Signed)
Subjective: CC: Scalp sensitivity, congestion PCP: [name], DO MWU:[id number] Orrison is a 70 y.o. female presenting to clinic today for:  1.  Scalp sensitivity Patient reports left-sided scalp sensitivity along the temporal aspect of her scalp that is been ongoing for about 3 weeks.  Denies any palpable masses, drainage.  She does report some pain in that area.  No vision change.  2.  Congestion/cough Patient reports that she often wakes up in the middle the night with congestion and feeling like she needs to cough them out.  She is an active every day smoker smoking 1/2 pack per day.  No hemoptysis, brown phlegm, unplanned weight loss, night sweats, change in exercise tolerance, shortness of breath or wheezing.  She has Zyrtec but does not use it regularly.  She does report postnasal drip.  3.  Hip pain Patient is under care of Dr. Lorin Mercy for this.  She was transition from a cane to a walker recently.  She will be seeing him again in the next few weeks.  So far not feeling much different with use of walker.  She is not sure what the plan will be going forward.  She does have Motrin 600 mg at home but she is only been using 200 mg a couple of times a day if needed.  Also has gabapentin at home but has not been utilizing this.  Does not wish to be on any opioids including tramadol.  ROS: Per HPI  Allergies  Allergen Reactions   Acetaminophen Other (See Comments)    HTN   Past Medical History:  Diagnosis Date   Allergy    DJD (degenerative joint disease)    Hyperlipidemia    Osteopenia     Current Outpatient Medications:    cetirizine (ZYRTEC ALLERGY) 10 MG tablet, Take 1 tablet (10 mg total) by mouth daily., Disp: 90 tablet, Rfl: 1   Cholecalciferol (VITAMIN D3) 50 MCG (2000 UT) CAPS, Take 1 capsule by mouth daily., Disp: , Rfl:    linaclotide (LINZESS) 290 MCG CAPS capsule, Take 290 mcg by mouth daily before breakfast., Disp: , Rfl:    losartan (COZAAR) 50 MG tablet, Take 1  tablet (50 mg total) by mouth daily., Disp: 90 tablet, Rfl: 3   simvastatin (ZOCOR) 20 MG tablet, Take 1 tablet (20 mg total) by mouth at bedtime., Disp: 90 tablet, Rfl: 3   sodium chloride (OCEAN) 0.65 % SOLN nasal spray, Place 1 spray into both nostrils as needed for congestion., Disp: 60 mL, Rfl: 1   ibuprofen (ADVIL) 600 MG tablet, Take 1 tablet (600 mg total) by mouth 2 (two) times daily as needed for moderate pain. (Patient not taking: No sig reported), Disp: 90 tablet, Rfl: 1 Social History   Socioeconomic History   Marital status: Single    Spouse name: Not on file   Number of children: Not on file   Years of education: Not on file   Highest education level: 12th grade  Occupational History   Occupation: [employer]  Tobacco Use   Smoking status: Every Day    Packs/day: 0.50    Years: 40.00    Pack years: 20.00    Types: Cigarettes   Smokeless tobacco: Never  Vaping Use   Vaping Use: Never used  Substance and Sexual Activity   Alcohol use: No   Drug use: No   Sexual activity: Not on file  Other Topics Concern   Not on file  Social History Narrative  Not on file   Social Determinants of Health   Financial Resource Strain: Not on file  Food Insecurity: Not on file  Transportation Needs: Not on file  Physical Activity: Not on file  Stress: Not on file  Social Connections: Not on file  Intimate Partner Violence: Not on file   Family History  Problem Relation Age of Onset   Alzheimer's disease Mother    Cancer Father        stomach cancer   Diabetes Sister    Heart disease Brother        MI    Objective: Office vital signs reviewed. BP 115/65   Pulse 77   Temp 98.1 F (36.7 C)   Ht 5\' 5"  (1.651 m)   Wt 152 lb 9.6 oz (69.2 kg)   SpO2 96%   BMI 25.39 kg/m   Physical Examination:  General: Awake, alert, No acute distress HEENT: Normal; oropharynx without masses.  No cobblestoning noted  Scalp: She does have hypersensitivity over the anterior  aspect of the parietal region of the scalp.  However there is no palpable abnormalities nor changes in the skin in this region.  She has no tenderness to palpation over the temporal area of the face Cardio: regular rate and rhythm, S1S2 heard, no murmurs appreciated Pulm: Globally decreased breath sounds without wheezes, rhonchi or rales.  Normal work of breathing on room air MSK: Slow, antalgic gait.  Utilizing rolling walker  Assessment/ Plan: 70 y.o. female   Chronic cough - Plan: DG Chest 2 View  Tobacco use - Plan: DG Chest 2 View  Scalp pain  Mixed hyperlipidemia - Plan: Lipid Panel  Chronic right hip pain  Uncertain etiology of chronic cough but I think it is probably combination of chronic tobacco use, postnasal drip.  Chest x-ray was obtained given active smoking.  Reinforce smoking cessation.  Scalp pain may be a manifestation of trigeminal neuralgia.  Discussed that gabapentin may be helpful with this.  She certainly had no tenderness to palpation over the temporal area to suggest giant cell arteritis nor will she have any other concerning symptoms or signs to suggest this process.  Lipid panel ordered.  Refused switch from Zocor to Crestor last visit.  May need to readdress this if persistently elevated lipids  Continue to follow-up with Dr. Lorin Mercy as scheduled.  We will await his recommendations given limited improvement with use of walker.  Agree that increased dose of oral NSAID and use of gabapentin may be helpful in controlling some of her pain.  She was very adamant about avoiding any controlled substances which I think is reasonable.  No orders of the defined types were placed in this encounter.  No orders of the defined types were placed in this encounter.    [name], DO [place] [phone]

## 2021-07-01 LAB — LIPID PANEL
Chol/HDL Ratio: 4 ratio (ref 0.0–4.4)
Cholesterol, Total: 191 mg/dL (ref 100–199)
HDL: 48 mg/dL (ref >39)
LDL Chol Calc (NIH): 109 mg/dL — ABNORMAL HIGH (ref 0–99)
Triglycerides: 197 mg/dL — ABNORMAL HIGH (ref 0–149)
VLDL Cholesterol Cal: 34 mg/dL (ref 5–40)

## 2021-07-06 ENCOUNTER — Encounter (HOSPITAL_COMMUNITY): Payer: Self-pay

## 2021-07-06 ENCOUNTER — Other Ambulatory Visit: Payer: Self-pay

## 2021-07-06 ENCOUNTER — Emergency Department (HOSPITAL_COMMUNITY)
Admission: EM | Admit: 2021-07-06 | Discharge: 2021-07-06 | Disposition: A | Discharge status: Home / Self Care | Payer: Medicare HMO | Attending: Emergency Medicine | Admitting: Emergency Medicine

## 2021-07-06 ENCOUNTER — Emergency Department (HOSPITAL_COMMUNITY): Payer: Medicare HMO

## 2021-07-06 DIAGNOSIS — F17210 Nicotine dependence, cigarettes, uncomplicated: Secondary | ICD-10-CM | POA: Diagnosis not present

## 2021-07-06 DIAGNOSIS — Z79899 Other long term (current) drug therapy: Secondary | ICD-10-CM | POA: Diagnosis not present

## 2021-07-06 DIAGNOSIS — I10 Essential (primary) hypertension: Secondary | ICD-10-CM | POA: Diagnosis not present

## 2021-07-06 DIAGNOSIS — R519 Headache, unspecified: Secondary | ICD-10-CM | POA: Diagnosis not present

## 2021-07-06 DIAGNOSIS — R079 Chest pain, unspecified: Secondary | ICD-10-CM | POA: Diagnosis not present

## 2021-07-06 DIAGNOSIS — R0602 Shortness of breath: Secondary | ICD-10-CM | POA: Diagnosis not present

## 2021-07-06 DIAGNOSIS — R109 Unspecified abdominal pain: Secondary | ICD-10-CM | POA: Insufficient documentation

## 2021-07-06 DIAGNOSIS — R0789 Other chest pain: Secondary | ICD-10-CM | POA: Diagnosis not present

## 2021-07-06 LAB — COMPREHENSIVE METABOLIC PANEL
ALT: 13 U/L (ref 0–44)
AST: 15 U/L (ref 15–41)
Albumin: 3.7 g/dL (ref 3.5–5.0)
Alkaline Phosphatase: 83 U/L (ref 38–126)
Anion gap: 7 (ref 5–15)
BUN: 11 mg/dL (ref 8–23)
CO2: 27 mmol/L (ref 22–32)
Calcium: 8.8 mg/dL — ABNORMAL LOW (ref 8.9–10.3)
Chloride: 102 mmol/L (ref 98–111)
Creatinine, Ser: 0.78 mg/dL (ref 0.44–1.00)
GFR, Estimated: >60 mL/min (ref >60)
Glucose, Bld: 98 mg/dL (ref 70–99)
Potassium: 3 mmol/L — ABNORMAL LOW (ref 3.5–5.1)
Sodium: 136 mmol/L (ref 135–145)
Total Bilirubin: 1.2 mg/dL (ref 0.3–1.2)
Total Protein: 6.8 g/dL (ref 6.5–8.1)

## 2021-07-06 LAB — TROPONIN I (HIGH SENSITIVITY)
Troponin I (High Sensitivity): 4 ng/L (ref <18)
Troponin I (High Sensitivity): 4 ng/L

## 2021-07-06 LAB — CBC WITH DIFFERENTIAL/PLATELET
Abs Immature Granulocytes: 0.02 10*3/uL (ref 0.00–0.07)
Basophils Absolute: 0.1 10*3/uL (ref 0.0–0.1)
Basophils Relative: 1 %
Eosinophils Absolute: 0.1 10*3/uL (ref 0.0–0.5)
Eosinophils Relative: 1 %
HCT: 36.9 % (ref 36.0–46.0)
Hemoglobin: 12.2 g/dL (ref 12.0–15.0)
Immature Granulocytes: 0 %
Lymphocytes Relative: 34 %
Lymphs Abs: 2.3 10*3/uL (ref 0.7–4.0)
MCH: 33.8 pg (ref 26.0–34.0)
MCHC: 33.1 g/dL (ref 30.0–36.0)
MCV: 102.2 fL — ABNORMAL HIGH (ref 80.0–100.0)
Monocytes Absolute: 0.5 10*3/uL (ref 0.1–1.0)
Monocytes Relative: 8 %
Neutro Abs: 3.8 10*3/uL (ref 1.7–7.7)
Neutrophils Relative %: 56 %
Platelets: 325 10*3/uL (ref 150–400)
RBC: 3.61 MIL/uL — ABNORMAL LOW (ref 3.87–5.11)
RDW: 12.9 % (ref 11.5–15.5)
WBC: 6.8 10*3/uL (ref 4.0–10.5)
nRBC: 0 % (ref 0.0–0.2)

## 2021-07-06 LAB — URINALYSIS, ROUTINE W REFLEX MICROSCOPIC
Bacteria, UA: NONE SEEN
Bilirubin Urine: NEGATIVE
Glucose, UA: NEGATIVE mg/dL
Ketones, ur: NEGATIVE mg/dL
Leukocytes,Ua: NEGATIVE
Nitrite: NEGATIVE
Protein, ur: NEGATIVE mg/dL
Specific Gravity, Urine: 1.009 (ref 1.005–1.030)
pH: 5 (ref 5.0–8.0)

## 2021-07-06 LAB — LIPASE, BLOOD: Lipase: 30 U/L (ref 11–51)

## 2021-07-06 IMAGING — DX DG CHEST 1V PORT
1 series · 1 of 1 positions shown · non-contrast
Comparison: [DATE]

CLINICAL DATA: Shortness of breath, chest pain x2 weeks

EXAM:
PORTABLE CHEST 1 VIEW

[chest ap]
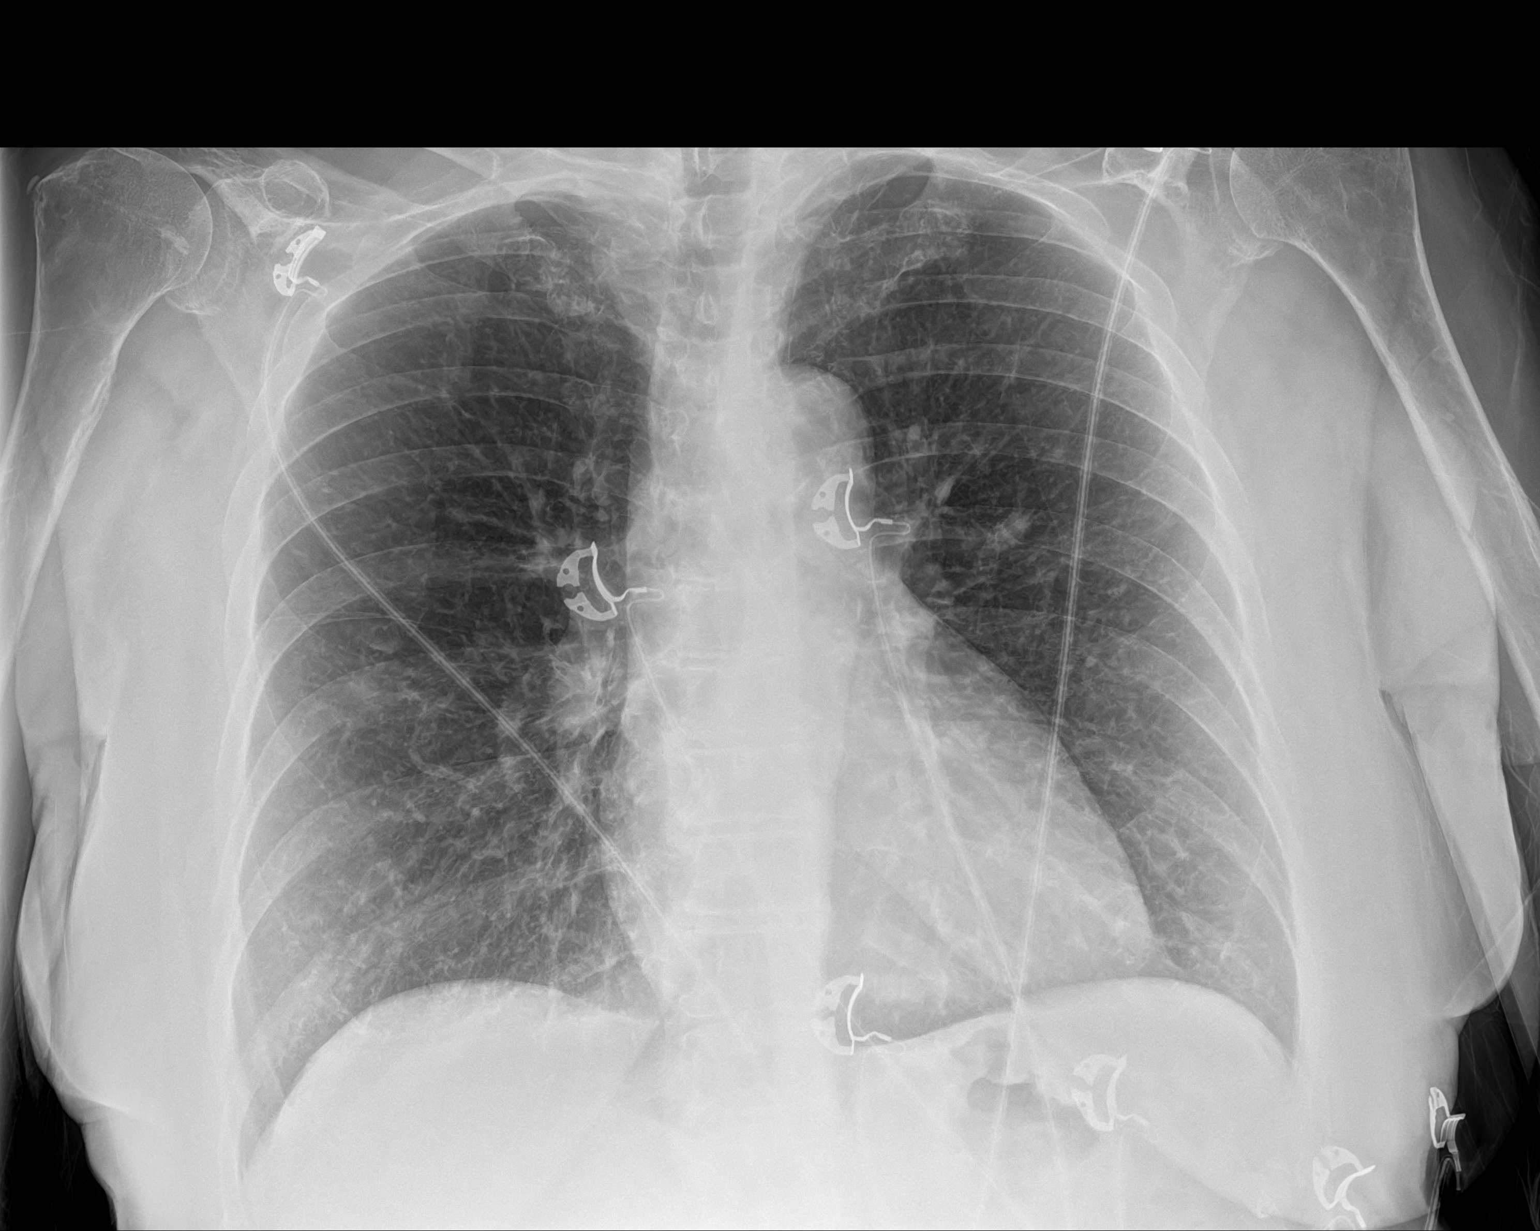

[1 of 1 positions shown; findings below may reference images not displayed]

FINDINGS: The cardiomediastinal silhouette is within normal limits. No pleural
effusion. No pneumothorax. No mass or consolidation. No acute
osseous abnormality.
IMPRESSION: No acute abnormality in the chest.

## 2021-07-06 MED ORDER — PANTOPRAZOLE SODIUM 20 MG PO TBEC
20.0000 mg | DELAYED_RELEASE_TABLET | Freq: Every day | ORAL | 0 refills | Status: DC
Start: 2021-07-06 — End: 2021-10-25

## 2021-07-06 MED ORDER — IBUPROFEN 800 MG PO TABS
800.0000 mg | ORAL_TABLET | Freq: Once | ORAL | Status: AC
  Administered 2021-07-06: 800 mg via ORAL
  Filled 2021-07-06: qty 1

## 2021-07-06 NOTE — Discharge Instructions (Addendum)
Follow-up with Dr. Domenic Polite in the next couple weeks for possible stress test.  Return if having problems.

## 2021-07-06 NOTE — ED Triage Notes (Signed)
Pt reports headache and left chest pain for past 2 weeks,  saw pmd last week who informed pt "probably nerves"  cxr and bloodwork done in office.  States took ibuprofen 600mg  for generalized pain with little improvement.  Denies fever, denies shortness of breath.

## 2021-07-06 NOTE — ED Notes (Signed)
Pt aware of need for urine specimen, will notify staff when able

## 2021-07-06 NOTE — ED Provider Notes (Signed)
[facility] Provider Note   CSN: [id number] Arrival date & time: 07/06/21  0093     History Chief Complaint  Patient presents with   Chest Pain    Elizabeth Wu is a 70 y.o. female.  Patient with some chest discomfort and abdominal discomfort.  No pain now.  No fever no chills mild headache  The history is provided by the patient and medical records. No language interpreter was used.  Chest Pain Pain location:  L chest Pain quality: aching   Pain radiates to:  Does not radiate Pain severity:  Mild Onset quality:  Sudden Timing:  Intermittent Progression:  Waxing and waning Chronicity:  New Context: not breathing   Relieved by:  Nothing Worsened by:  Nothing Ineffective treatments:  None tried Associated symptoms: headache   Associated symptoms: no abdominal pain, no back pain, no cough and no fatigue       Past Medical History:  Diagnosis Date   Allergy    DJD (degenerative joint disease)    Hyperlipidemia    Osteopenia     Patient Active Problem List   Diagnosis Date Noted   Pain in right hip 06/03/2021   Pelvic fracture ([place]) 06/03/2021   Viral upper respiratory tract infection 04/14/2021   Chronic constipation 05/20/2020   Hypertension 05/29/2019   Cholelithiases 09/13/2016   Bursitis, hip 04/05/2016   Meralgia paraesthetica 04/22/2014   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     Family History  Problem Relation Age of Onset   Alzheimer's disease Mother    Cancer Father        stomach cancer   Diabetes Sister    Heart disease Brother        MI    Social History   Tobacco Use   Smoking status: Every Day    Packs/day:  0.50    Years: 40.00    Pack years: 20.00    Types: Cigarettes   Smokeless tobacco: Never  Vaping Use   Vaping Use: Never used  Substance Use Topics   Alcohol use: No   Drug use: No    Home Medications Prior to Admission medications   Medication Sig Start Date End Date Taking? Authorizing Provider  Cholecalciferol (VITAMIN D3) 50 MCG (2000 UT) CAPS Take 1 capsule by mouth daily.   Yes [provider]  ibuprofen (ADVIL) 600 MG tablet Take 1 tablet (600 mg total) by mouth 2 (two) times daily as needed for moderate pain. 03/01/21  Yes Gottschalk, Leatrice Jewels M, DO  linaclotide (LINZESS) 290 MCG CAPS capsule Take 290 mcg by mouth daily before breakfast.   Yes [provider]  losartan (COZAAR) 50 MG tablet Take 1 tablet (50 mg total) by mouth daily. 11/30/20  Yes Gottschalk, Leatrice Jewels M, DO  Omega-3 Fatty Acids (FISH OIL) 1000 MG CAPS Take 1 capsule by mouth daily.   Yes [provider]  pantoprazole (PROTONIX) 20 MG tablet Take 1 tablet (20 mg total) by mouth daily. 07/06/21  Yes Milton Ferguson, MD  simvastatin (ZOCOR) 20 MG tablet Take 1 tablet (20 mg total) by mouth at bedtime. 11/30/20  Yes Gottschalk,  Ashly M, DO  sodium chloride (OCEAN) 0.65 % SOLN nasal spray Place 1 spray into both nostrils as needed for congestion. 04/14/21  Yes Ivy Lynn, NP  azelastine (ASTELIN) 0.1 % nasal spray Place 1 spray into both nostrils 2 (two) times daily. For runny nose Patient not taking: Reported on 07/06/2021 06/30/21   [name], DO  cetirizine (ZYRTEC ALLERGY) 10 MG tablet Take 1 tablet (10 mg total) by mouth daily. Patient not taking: Reported on 07/06/2021 04/14/21   Ivy Lynn, NP    Allergies    Acetaminophen  Review of Systems   Review of Systems  Constitutional:  Negative for appetite change and fatigue.  HENT:  Negative for congestion, ear discharge and sinus pressure.   Eyes:  Negative for discharge.  Respiratory:  Negative for cough.    Cardiovascular:  Positive for chest pain.  Gastrointestinal:  Negative for abdominal pain and diarrhea.  Genitourinary:  Negative for frequency and hematuria.  Musculoskeletal:  Negative for back pain.  Skin:  Negative for rash.  Neurological:  Positive for headaches. Negative for seizures.  Psychiatric/Behavioral:  Negative for hallucinations.    Physical Exam Updated Vital Signs BP (!) 137/59   Pulse 64   Temp 97.9 F (36.6 C)   Resp 17   SpO2 98%   Physical Exam Vitals and nursing note reviewed.  Constitutional:      Appearance: She is well-developed.  HENT:     Head: Normocephalic.     Nose: Nose normal.  Eyes:     General: No scleral icterus.    Conjunctiva/sclera: Conjunctivae normal.  Neck:     Thyroid: No thyromegaly.  Cardiovascular:     Rate and Rhythm: Normal rate and regular rhythm.     Heart sounds: No murmur heard.   No friction rub. No gallop.  Pulmonary:     Breath sounds: No stridor. No wheezing or rales.  Chest:     Chest wall: No tenderness.  Abdominal:     General: There is no distension.     Tenderness: There is no abdominal tenderness. There is no rebound.  Musculoskeletal:        General: Normal range of motion.     Cervical back: Neck supple.  Lymphadenopathy:     Cervical: No cervical adenopathy.  Skin:    Findings: No erythema or rash.  Neurological:     Mental Status: She is alert and oriented to person, place, and time.     Motor: No abnormal muscle tone.     Coordination: Coordination normal.  Psychiatric:        Behavior: Behavior normal.    ED Results / Procedures / Treatments   Labs (all labs ordered are listed, but only abnormal results are displayed) Labs Reviewed  CBC WITH DIFFERENTIAL/PLATELET - Abnormal; Notable for the following components:      Result Value   RBC 3.61 (*)    MCV 102.2 (*)    All other components within normal limits  COMPREHENSIVE METABOLIC PANEL - Abnormal; Notable for the following components:    Potassium 3.0 (*)    Calcium 8.8 (*)    All other components within normal limits  URINALYSIS, ROUTINE W REFLEX MICROSCOPIC - Abnormal; Notable for the following components:   Hgb urine dipstick SMALL (*)    All other components within normal limits  LIPASE, BLOOD  TROPONIN I (HIGH SENSITIVITY)  TROPONIN I (HIGH SENSITIVITY)    EKG EKG Interpretation  Date/Time:  Tuesday July 06 2021 08:50:34 EDT Ventricular Rate:  74 PR Interval:  131 QRS Duration: 85 QT Interval:  376 QTC Calculation: 418 R Axis:   72 Text Interpretation: Sinus rhythm Abnormal R-wave progression, early transition Nonspecific T abnormalities, anterior leads Confirmed by Milton Ferguson [phone]) on 07/06/2021 11:23:51 AM  Radiology DG Chest Port 1 View  Result Date: 07/06/2021 CLINICAL DATA:  Shortness of breath, chest pain x2 weeks EXAM: PORTABLE CHEST 1 VIEW COMPARISON:  June 30 2021 FINDINGS: The cardiomediastinal silhouette is within normal limits. No pleural effusion. No pneumothorax. No mass or consolidation. No acute osseous abnormality. IMPRESSION: No acute abnormality in the chest. Electronically Signed   By: Albin Felling M.D.   On: 07/06/2021 09:11    Procedures Procedures   Medications Ordered in ED Medications  ibuprofen (ADVIL) tablet 800 mg (800 mg Oral Given 07/06/21 0919)    ED Course  I have reviewed the triage vital signs and the nursing notes.  Pertinent labs & imaging results that were available during my care of the patient were reviewed by me and considered in my medical decision making (see chart for details).    MDM Rules/Calculators/A&P                           Patient with atypical chest pain and abdominal pain.  Labs EKG unremarkable.  Patient placed on protonic will follow up with cardiology for possible stress test Final Clinical Impression(s) / ED Diagnoses Final diagnoses:  Atypical chest pain    Rx / DC Orders ED Discharge Orders          Ordered     pantoprazole (PROTONIX) 20 MG tablet  Daily        07/06/21 1235             Milton Ferguson, MD 07/07/21 [phone]

## 2021-07-08 ENCOUNTER — Ambulatory Visit (INDEPENDENT_AMBULATORY_CARE_PROVIDER_SITE_OTHER): Payer: Medicare HMO | Admitting: Orthopaedic Surgery

## 2021-07-08 ENCOUNTER — Encounter: Payer: Self-pay | Admitting: Orthopaedic Surgery

## 2021-07-08 ENCOUNTER — Other Ambulatory Visit: Payer: Self-pay

## 2021-07-08 VITALS — Ht 64.0 in | Wt 152.0 lb

## 2021-07-08 DIAGNOSIS — S32691A Other specified fracture of right ischium, initial encounter for closed fracture: Secondary | ICD-10-CM

## 2021-07-08 NOTE — Progress Notes (Signed)
Office Visit Note   Patient: Elizabeth Wu           Date of Birth: [birth year]           MRN: [id number] Visit Date: 07/08/2021              Requested by: [name], DO [place],  [place] [id number] PCP: [name], DO   Assessment & Plan: Visit Diagnoses:  1. Other closed fracture of right ischium, initial encounter [facility])     Plan: Reviewed findings and the MRI scan.  She can wean from the walker to using a cane on the right hand since her left shoulder has been sore and painful.  We will recheck her in 7 weeks and repeat x-ray of the pelvis did show the right hip and right and left pelvic oblique on return.  Follow-Up Instructions: Return in about 7 weeks (around 08/26/2021).   Orders:  No orders of the defined types were placed in this encounter.  No orders of the defined types were placed in this encounter.     Procedures: No procedures performed   Clinical Data: No additional findings.   Subjective: Chief Complaint  Patient presents with   Right Hip - Follow-up    HPI 70 year old female returns with ongoing pain with MRI documenting acute or subacute pelvic fracture on the right adjacent to the acetabulum.  No history of fall or injury.  She does have known osteopenia.  She has been using a walker had some soreness in her shoulder had a negative cardiac work-up.  Review of Systems all other systems noncontributory to HPI.   Objective: Vital Signs: Ht 5\' 4"  (1.626 m)   Wt 152 lb (68.9 kg)   BMI 26.09 kg/m   Physical Exam Constitutional:      Appearance: She is well-developed.  HENT:     Head: Normocephalic.     Right Ear: External ear normal.     Left Ear: External ear normal. There is no impacted cerumen.  Eyes:     Pupils: Pupils are equal, round, and reactive to light.  Neck:     Thyroid: No thyromegaly.     Trachea: No tracheal deviation.  Cardiovascular:     Rate and Rhythm: Normal rate.  Pulmonary:     Effort:  Pulmonary effort is normal.  Abdominal:     Palpations: Abdomen is soft.  Musculoskeletal:     Cervical back: No rigidity.  Skin:    General: Skin is warm and dry.  Neurological:     Mental Status: She is alert and oriented to person, place, and time.  Psychiatric:        Behavior: Behavior normal.    Ortho Exam negative logroll the hips negative straight leg raising 90 degrees no sciatic notch tenderness.  Minimal trochanteric bursal tenderness on the right negative on the left.  Specialty Comments:  No specialty comments available.  Imaging: No results found.   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   Hyperlipidemia 05/31/2013   Osteopenia of multiple sites 05/31/2013   DJD (degenerative joint disease) 05/31/2013   Past Medical History:  Diagnosis  Date   Allergy    DJD (degenerative joint disease)    Hyperlipidemia    Osteopenia     Family History  Problem Relation Age of Onset   Alzheimer's disease Mother    Cancer Father        stomach cancer   Diabetes Sister    Heart disease Brother        MI    Past Surgical History:  Procedure Laterality Date   APPENDECTOMY     HERNIA REPAIR     TONSILLECTOMY     TONSILLECTOMY     Social History   Occupational History   Occupation: [employer]  Tobacco Use   Smoking status: Every Day    Packs/day: 0.50    Years: 40.00    Pack years: 20.00    Types: Cigarettes   Smokeless tobacco: Never  Vaping Use   Vaping Use: Never used  Substance and Sexual Activity   Alcohol use: No   Drug use: No   Sexual activity: Not on file

## 2021-07-20 ENCOUNTER — Telehealth: Payer: Self-pay | Admitting: Family Medicine

## 2021-07-21 ENCOUNTER — Ambulatory Visit (INDEPENDENT_AMBULATORY_CARE_PROVIDER_SITE_OTHER): Payer: Medicare HMO | Admitting: Family Medicine

## 2021-07-21 ENCOUNTER — Other Ambulatory Visit: Payer: Self-pay

## 2021-07-21 ENCOUNTER — Encounter: Payer: Self-pay | Admitting: Family Medicine

## 2021-07-21 VITALS — BP 137/81 | HR 99 | Temp 98.4°F | Ht 64.0 in | Wt 151.2 lb

## 2021-07-21 DIAGNOSIS — R52 Pain, unspecified: Secondary | ICD-10-CM

## 2021-07-21 DIAGNOSIS — R519 Headache, unspecified: Secondary | ICD-10-CM | POA: Diagnosis not present

## 2021-07-21 DIAGNOSIS — E876 Hypokalemia: Secondary | ICD-10-CM | POA: Diagnosis not present

## 2021-07-21 DIAGNOSIS — R202 Paresthesia of skin: Secondary | ICD-10-CM

## 2021-07-21 NOTE — Telephone Encounter (Signed)
Will discuss at appt today with Rakes

## 2021-07-21 NOTE — Progress Notes (Signed)
Subjective:  Patient ID: Elizabeth Wu, female    DOB: [birth year], 70 y.o.   MRN: [id number]  Patient Care Team: [name], DO as PCP - General (Family Medicine) Gala Romney Cristopher Estimable, MD as Consulting Physician (Gastroenterology)   Chief Complaint:  Wants MRI (Head tender to touch, left breast tender to touch and tingling, left leg pain all the time )   HPI: Elizabeth Wu is a 70 y.o. female presenting on 07/21/2021 for Wants MRI (Head tender to touch, left breast tender to touch and tingling, left leg pain all the time )   Pt presents today with complaints of tingling and tenderness to left scalp, left breast, and left leg. States this has been ongoing for several months. Touching makes the pain worse, nothing has alleviated the pain. No injury reported. No fever, chills, weakness, headache, confusion, loss of function, or focal neurological deficits.    Relevant past medical, surgical, family, and social history reviewed and updated as indicated.  Allergies and medications reviewed and updated. Data reviewed: Chart in Epic.   Past Medical History:  Diagnosis Date   Allergy    DJD (degenerative joint disease)    Hyperlipidemia    Osteopenia     Past Surgical History:  Procedure Laterality Date   APPENDECTOMY     HERNIA REPAIR     TONSILLECTOMY     TONSILLECTOMY      Social History   Socioeconomic History   Marital status: Single    Spouse name: Not on file   Number of children: Not on file   Years of education: Not on file   Highest education level: 12th grade  Occupational History   Occupation: [employer]  Tobacco Use   Smoking status: Every Day    Packs/day: 0.50    Years: 40.00    Pack years: 20.00    Types: Cigarettes   Smokeless tobacco: Never  Vaping Use   Vaping Use: Never used  Substance and Sexual Activity   Alcohol use: No   Drug use: No   Sexual activity: Not on file  Other Topics Concern   Not on file  Social History Narrative    Not on file   Social Determinants of Health   Financial Resource Strain: Not on file  Food Insecurity: Not on file  Transportation Needs: Not on file  Physical Activity: Not on file  Stress: Not on file  Social Connections: Not on file  Intimate Partner Violence: Not on file    Outpatient Encounter Medications as of 07/21/2021  Medication Sig   Cholecalciferol (VITAMIN D3) 50 MCG (2000 UT) CAPS Take 1 capsule by mouth daily.   ibuprofen (ADVIL) 600 MG tablet Take 1 tablet (600 mg total) by mouth 2 (two) times daily as needed for moderate pain.   linaclotide (LINZESS) 290 MCG CAPS capsule Take 290 mcg by mouth daily before breakfast.   losartan (COZAAR) 50 MG tablet Take 1 tablet (50 mg total) by mouth daily.   Omega-3 Fatty Acids (FISH OIL) 1000 MG CAPS Take 1 capsule by mouth daily.   pantoprazole (PROTONIX) 20 MG tablet Take 1 tablet (20 mg total) by mouth daily.   simvastatin (ZOCOR) 20 MG tablet Take 1 tablet (20 mg total) by mouth at bedtime.   sodium chloride (OCEAN) 0.65 % SOLN nasal spray Place 1 spray into both nostrils as needed for congestion.   [DISCONTINUED] azelastine (ASTELIN) 0.1 % nasal spray Place 1 spray into both nostrils 2 (two) times  daily. For runny nose (Patient not taking: No sig reported)   [DISCONTINUED] cetirizine (ZYRTEC ALLERGY) 10 MG tablet Take 1 tablet (10 mg total) by mouth daily. (Patient not taking: No sig reported)   No facility-administered encounter medications on file as of 07/21/2021.    Allergies  Allergen Reactions   Acetaminophen Other (See Comments)    HTN    Review of Systems  Constitutional:  Positive for appetite change. Negative for activity change, chills, diaphoresis, fatigue, fever and unexpected weight change.  HENT: Negative.    Eyes: Negative.   Respiratory:  Negative for cough, chest tightness and shortness of breath.   Cardiovascular:  Negative for chest pain, palpitations and leg swelling.  Gastrointestinal:  Negative  for abdominal pain, blood in stool, constipation, diarrhea, nausea and vomiting.  Endocrine: Negative.   Genitourinary:  Negative for decreased urine volume, dysuria, frequency and urgency.  Musculoskeletal:  Positive for arthralgias, back pain, gait problem (uses walker) and myalgias. Negative for joint swelling, neck pain and neck stiffness.  Skin: Negative.  Negative for color change, pallor, rash and wound.  Allergic/Immunologic: Negative.   Neurological:  Positive for weakness (generalized). Negative for dizziness, tremors, seizures, syncope, facial asymmetry, speech difficulty, light-headedness, numbness and headaches.       Paresthesias to scalp, breast, and left leg  Hematological: Negative.   Psychiatric/Behavioral:  Negative for confusion, hallucinations, sleep disturbance and suicidal ideas.   All other systems reviewed and are negative.      Objective:  BP 137/81   Pulse 99   Temp 98.4 F (36.9 C) (Temporal)   Ht _0  (1.626 m)   Wt 151 lb 3.2 oz (68.6 kg)   SpO2 96%   BMI 25.95 kg/m    Wt Readings from Last 3 Encounters:  07/21/21 151 lb 3.2 oz (68.6 kg)  07/08/21 152 lb (68.9 kg)  06/30/21 152 lb 9.6 oz (69.2 kg)    Physical Exam Vitals and nursing note reviewed.  Constitutional:      General: She is not in acute distress.    Appearance: Normal appearance. She is well-developed, well-groomed and normal weight. She is not ill-appearing, toxic-appearing or diaphoretic.  HENT:     Head: Normocephalic and atraumatic.     Jaw: There is normal jaw occlusion.      Right Ear: Hearing, tympanic membrane, ear canal and external ear normal.     Left Ear: Hearing, tympanic membrane, ear canal and external ear normal.     Nose: Nose normal.     Mouth/Throat:     Lips: Pink.     Mouth: Mucous membranes are moist.     Pharynx: Oropharynx is clear. Uvula midline.  Eyes:     General: Lids are normal.     Extraocular Movements: Extraocular movements intact.      Conjunctiva/sclera: Conjunctivae normal.     Pupils: Pupils are equal, round, and reactive to light.  Neck:     Thyroid: No thyroid mass, thyromegaly or thyroid tenderness.     Vascular: No carotid bruit or JVD.     Trachea: Trachea and phonation normal.  Cardiovascular:     Rate and Rhythm: Normal rate and regular rhythm.     Chest Wall: PMI is not displaced.     Pulses: Normal pulses.     Heart sounds: Normal heart sounds. No murmur heard.   No friction rub. No gallop.  Pulmonary:     Effort: Pulmonary effort is normal. No respiratory distress.  Breath sounds: Normal breath sounds. No wheezing.  Abdominal:     General: Bowel sounds are normal. There is no distension or abdominal bruit.     Palpations: Abdomen is soft. There is no hepatomegaly or splenomegaly.     Tenderness: There is no abdominal tenderness. There is no right CVA tenderness or left CVA tenderness.     Hernia: No hernia is present.  Musculoskeletal:     Cervical back: Normal range of motion and neck supple.  Lymphadenopathy:     Cervical: No cervical adenopathy.  Skin:    General: Skin is warm and dry.     Capillary Refill: Capillary refill takes less than 2 seconds.     Coloration: Skin is not cyanotic, jaundiced or pale.     Findings: No rash.  Neurological:     General: No focal deficit present.     Mental Status: She is alert and oriented to person, place, and time.     Cranial Nerves: Cranial nerves are intact. No cranial nerve deficit.     Sensory: Sensation is intact. No sensory deficit.     Motor: Weakness (generalized) present.     Coordination: Coordination is intact. Coordination normal.     Gait: Gait abnormal (uses walker).     Deep Tendon Reflexes: Reflexes are normal and symmetric. Reflexes normal.  Psychiatric:        Attention and Perception: Attention and perception normal.        Mood and Affect: Mood and affect normal.        Speech: Speech normal.        Behavior: Behavior normal.  Behavior is cooperative.        Thought Content: Thought content normal.        Cognition and Memory: Cognition and memory normal.        Judgment: Judgment normal.    Results for orders placed or performed during the hospital encounter of 07/06/21  CBC with Differential/Platelet  Result Value Ref Range   WBC 6.8 4.0 - 10.5 K/uL   RBC 3.61 (L) 3.87 - 5.11 MIL/uL   Hemoglobin 12.2 12.0 - 15.0 g/dL   HCT 36.9 36.0 - 46.0 %   MCV 102.2 (H) 80.0 - 100.0 fL   MCH 33.8 26.0 - 34.0 pg   MCHC 33.1 30.0 - 36.0 g/dL   RDW 12.9 11.5 - 15.5 %   Platelets 325 150 - 400 K/uL   nRBC 0.0 0.0 - 0.2 %   Neutrophils Relative % 56 %   Neutro Abs 3.8 1.7 - 7.7 K/uL   Lymphocytes Relative 34 %   Lymphs Abs 2.3 0.7 - 4.0 K/uL   Monocytes Relative 8 %   Monocytes Absolute 0.5 0.1 - 1.0 K/uL   Eosinophils Relative 1 %   Eosinophils Absolute 0.1 0.0 - 0.5 K/uL   Basophils Relative 1 %   Basophils Absolute 0.1 0.0 - 0.1 K/uL   Immature Granulocytes 0 %   Abs Immature Granulocytes 0.02 0.00 - 0.07 K/uL  Comprehensive metabolic panel  Result Value Ref Range   Sodium 136 135 - 145 mmol/L   Potassium 3.0 (L) 3.5 - 5.1 mmol/L   Chloride 102 98 - 111 mmol/L   CO2 27 22 - 32 mmol/L   Glucose, Bld 98 70 - 99 mg/dL   BUN 11 8 - 23 mg/dL   Creatinine, Ser 0.78 0.44 - 1.00 mg/dL   Calcium 8.8 (L) 8.9 - 10.3 mg/dL   Total Protein 6.8 6.5 -  8.1 g/dL   Albumin 3.7 3.5 - 5.0 g/dL   AST 15 15 - 41 U/L   ALT 13 0 - 44 U/L   Alkaline Phosphatase 83 38 - 126 U/L   Total Bilirubin 1.2 0.3 - 1.2 mg/dL   GFR, Estimated >60 >60 mL/min   Anion gap 7 5 - 15  Lipase, blood  Result Value Ref Range   Lipase 30 11 - 51 U/L  Urinalysis, Routine w reflex microscopic Urine, Clean Catch  Result Value Ref Range   Color, Urine YELLOW YELLOW   APPearance CLEAR CLEAR   Specific Gravity, Urine 1.009 1.005 - 1.030   pH 5.0 5.0 - 8.0   Glucose, UA NEGATIVE NEGATIVE mg/dL   Hgb urine dipstick SMALL (A) NEGATIVE   Bilirubin  Urine NEGATIVE NEGATIVE   Ketones, ur NEGATIVE NEGATIVE mg/dL   Protein, ur NEGATIVE NEGATIVE mg/dL   Nitrite NEGATIVE NEGATIVE   Leukocytes,Ua NEGATIVE NEGATIVE   RBC / HPF 0-5 0 - 5 RBC/hpf   WBC, UA 0-5 0 - 5 WBC/hpf   Bacteria, UA NONE SEEN NONE SEEN   Mucus PRESENT   Troponin I (High Sensitivity)  Result Value Ref Range   Troponin I (High Sensitivity) 4 <18 ng/L  Troponin I (High Sensitivity)  Result Value Ref Range   Troponin I (High Sensitivity) 4 <18 ng/L       Pertinent labs & imaging results that were available during my care of the patient were reviewed by me and considered in my medical decision making.  Assessment & Plan:  Lamyra was seen today for wants mri.  Diagnoses and all orders for this visit:  Shooting pain Scalp tenderness Paresthesia Will check electrolytes and Vit B12. Due to ongoing symptoms, will refer to neurology for evaluation.  -     Ambulatory referral to Neurology -     Vitamin B12 -     BMP8+EGFR  Hypokalemia Will recheck BMP today. Adequate potassium intake in diet discussed.  -     BMP8+EGFR    Continue all other maintenance medications.  Follow up plan: Return if symptoms worsen or fail to improve.   The above assessment and management plan was discussed with the patient. The patient verbalized understanding of and has agreed to the management plan. Patient is aware to call the clinic if they develop any new symptoms or if symptoms persist or worsen. Patient is aware when to return to the clinic for a follow-up visit. Patient educated on when it is appropriate to go to the emergency department.   Monia Pouch, FNP-C [facility] [phone]

## 2021-07-21 NOTE — Telephone Encounter (Signed)
So we didn't talk about MRI... not sure what we would be looking for exactly.  I'm certainly glad to refer to neurology for nerve conduction studies, this might clue [place] in as to why this is happening.  Let me know if she wants me to go ahead and run labs or just wants the referral.

## 2021-07-22 LAB — BMP8+EGFR
BUN/Creatinine Ratio: 12 (ref 12–28)
BUN: 10 mg/dL (ref 8–27)
CO2: 25 mmol/L (ref 20–29)
Calcium: 9.9 mg/dL (ref 8.7–10.3)
Chloride: 101 mmol/L (ref 96–106)
Creatinine, Ser: 0.82 mg/dL (ref 0.57–1.00)
Glucose: 123 mg/dL — ABNORMAL HIGH (ref 65–99)
Potassium: 3.2 mmol/L — ABNORMAL LOW (ref 3.5–5.2)
Sodium: 142 mmol/L (ref 134–144)
eGFR: 77 mL/min/{1.73_m2} (ref >59)

## 2021-07-22 LAB — VITAMIN B12: Vitamin B-12: 284 pg/mL (ref 232–1245)

## 2021-07-27 ENCOUNTER — Other Ambulatory Visit: Payer: Self-pay | Admitting: Family Medicine

## 2021-07-27 DIAGNOSIS — Z1231 Encounter for screening mammogram for malignant neoplasm of breast: Secondary | ICD-10-CM

## 2021-07-30 ENCOUNTER — Other Ambulatory Visit (HOSPITAL_COMMUNITY): Payer: Self-pay | Admitting: Neurology

## 2021-07-30 DIAGNOSIS — M1990 Unspecified osteoarthritis, unspecified site: Secondary | ICD-10-CM | POA: Diagnosis not present

## 2021-07-30 DIAGNOSIS — E785 Hyperlipidemia, unspecified: Secondary | ICD-10-CM | POA: Diagnosis not present

## 2021-07-30 DIAGNOSIS — M316 Other giant cell arteritis: Secondary | ICD-10-CM | POA: Diagnosis not present

## 2021-07-30 DIAGNOSIS — R202 Paresthesia of skin: Secondary | ICD-10-CM

## 2021-08-06 ENCOUNTER — Encounter: Payer: Self-pay | Admitting: Nurse Practitioner

## 2021-08-06 ENCOUNTER — Ambulatory Visit (INDEPENDENT_AMBULATORY_CARE_PROVIDER_SITE_OTHER): Payer: Medicare HMO | Admitting: Nurse Practitioner

## 2021-08-06 ENCOUNTER — Other Ambulatory Visit: Payer: Self-pay

## 2021-08-06 VITALS — BP 162/87 | HR 77 | Temp 97.4°F | Ht 64.0 in | Wt 155.0 lb

## 2021-08-06 DIAGNOSIS — R0781 Pleurodynia: Secondary | ICD-10-CM | POA: Diagnosis not present

## 2021-08-06 DIAGNOSIS — N63 Unspecified lump in unspecified breast: Secondary | ICD-10-CM

## 2021-08-06 DIAGNOSIS — M25559 Pain in unspecified hip: Secondary | ICD-10-CM

## 2021-08-06 MED ORDER — IBUPROFEN 600 MG PO TABS: 600.0000 mg | ORAL_TABLET | Freq: Two times a day (BID) | ORAL | 1 refills | Status: DC | PRN

## 2021-08-06 NOTE — Patient Instructions (Signed)
Breast Tenderness Breast tenderness is a common problem for women of all ages, but may also occur in men. Breast tenderness may range from mild discomfort to severe pain. In women, the pain usually comes and goes with the menstrual cycle, but it can also be constant. Breast tenderness has many possible causes, including hormone changes, infections, and taking certain medicines. You may have tests, such as a mammogram or an ultrasound, to check for any unusual findings. Having breast tenderness usually does not mean that you have breast cancer. Follow these instructions at home: Managing pain and discomfort  If directed, put ice to the painful area. To do this: Put ice in a plastic bag. Place a towel between your skin and the bag. Leave the ice on for 20 minutes, 2-3 times a day. Wear a supportive bra, especially during exercise. You may also want to wear a supportive bra while sleeping if your breasts are very tender. Medicines Take over-the-counter and prescription medicines only as told by your health care provider. If the cause of your pain is infection, you may be prescribed an antibiotic medicine. If you were prescribed an antibiotic, take it as told by your health care provider. Do not stop taking the antibiotic even if you start to feel better. Eating and drinking Your health care provider may recommend that you lessen the amount of fat in your diet. You can do this by: Limiting fried foods. Cooking foods using methods such as baking, boiling, grilling, and broiling. Decrease the amount of caffeine in your diet. Instead, drink more water and choose caffeine-free drinks. General instructions  Keep a log of the days and times when your breasts are most tender. Ask your health care provider how to do breast exams at home. This will help you notice if you have an unusual growth or lump. Keep all follow-up visits as told by your health care provider. This is important. Contact a health care  provider if: Any part of your breast is hard, red, and hot to the touch. This may be a sign of infection. You are a woman and: Not breastfeeding and you have fluid, especially blood or pus, coming out of your nipples. Have a new or painful lump in your breast that remains after your menstrual period ends. You have a fever. Your pain does not improve or it gets worse. Your pain is interfering with your daily activities. Summary Breast tenderness may range from mild discomfort to severe pain. Breast tenderness has many possible causes, including hormone changes, infections, and taking certain medicines. It can be treated with ice, wearing a supportive bra, and medicines. Make changes to your diet if told to by your health care provider. This information is not intended to replace advice given to you by your health care provider. Make sure you discuss any questions you have with your health care provider. Document Revised: 04/01/2019 Document Reviewed: 04/01/2019 Elsevier Patient Education  [place].

## 2021-08-06 NOTE — Progress Notes (Signed)
Acute Office Visit  Subjective:    Patient ID: Elizabeth Wu, female    DOB: 11-Jun-1951, 70 y.o.   MRN: [id number]  Chief Complaint  Patient presents with   Breast Problem    HPI Patient is in today for tenderness in left breast.  Patient reports this is not new but having abnormal mammogram a year prior.  Today patient is reporting uncontrolled pain and tenderness.  No fever, nausea or vomiting associated with symptoms.  Past Medical History:  Diagnosis Date   Allergy    DJD (degenerative joint disease)    Hyperlipidemia    Osteopenia     Past Surgical History:  Procedure Laterality Date   APPENDECTOMY     HERNIA REPAIR     TONSILLECTOMY     TONSILLECTOMY      Family History  Problem Relation Age of Onset   Alzheimer's disease Mother    Cancer Father        stomach cancer   Diabetes Sister    Heart disease Brother        MI    Social History   Socioeconomic History   Marital status: Single    Spouse name: Not on file   Number of children: Not on file   Years of education: Not on file   Highest education level: 12th grade  Occupational History   Occupation: [employer]  Tobacco Use   Smoking status: Every Day    Packs/day: 0.50    Years: 40.00    Pack years: 20.00    Types: Cigarettes   Smokeless tobacco: Never  Vaping Use   Vaping Use: Never used  Substance and Sexual Activity   Alcohol use: No   Drug use: No   Sexual activity: Not on file  Other Topics Concern   Not on file  Social History Narrative   Not on file   Social Determinants of Health   Financial Resource Strain: Not on file  Food Insecurity: Not on file  Transportation Needs: Not on file  Physical Activity: Not on file  Stress: Not on file  Social Connections: Not on file  Intimate Partner Violence: Not on file    Outpatient Medications Prior to Visit  Medication Sig Dispense Refill   Cholecalciferol (VITAMIN D3) 50 MCG (2000 UT) CAPS Take 1 capsule by mouth  daily.     ibuprofen (ADVIL) 600 MG tablet Take 1 tablet (600 mg total) by mouth 2 (two) times daily as needed for moderate pain. 90 tablet 1   linaclotide (LINZESS) 290 MCG CAPS capsule Take 290 mcg by mouth daily before breakfast.     losartan (COZAAR) 50 MG tablet Take 1 tablet (50 mg total) by mouth daily. 90 tablet 3   Omega-3 Fatty Acids (FISH OIL) 1000 MG CAPS Take 1 capsule by mouth daily.     pantoprazole (PROTONIX) 20 MG tablet Take 1 tablet (20 mg total) by mouth daily. 30 tablet 0   simvastatin (ZOCOR) 20 MG tablet Take 1 tablet (20 mg total) by mouth at bedtime. 90 tablet 3   sodium chloride (OCEAN) 0.65 % SOLN nasal spray Place 1 spray into both nostrils as needed for congestion. 60 mL 1   No facility-administered medications prior to visit.    Allergies  Allergen Reactions   Acetaminophen Other (See Comments)    HTN    Review of Systems  Constitutional: Negative.   HENT: Negative.    Eyes: Negative.   Respiratory: Negative.  Gastrointestinal: Negative.   Musculoskeletal: Negative.   Skin:  Negative for rash.  All other systems reviewed and are negative.     Objective:    Physical Exam Vitals and nursing note reviewed. Exam conducted with a chaperone present.  Constitutional:      Appearance: Normal appearance.  HENT:     Head: Normocephalic.     Nose: Nose normal.  Eyes:     Conjunctiva/sclera: Conjunctivae normal.  Cardiovascular:     Rate and Rhythm: Normal rate and regular rhythm.  Pulmonary:     Effort: Pulmonary effort is normal.     Breath sounds: Normal breath sounds.  Chest:  Breasts:    Right: No tenderness.     Left: Swelling and tenderness present. No inverted nipple, nipple discharge or skin change.       Comments: Tender lump palpated at 3 o'clock left breast and at 8 o'clock on right breast Skin:    Findings: No rash.  Neurological:     Mental Status: She is alert and oriented to person, place, and time.  Psychiatric:         Behavior: Behavior normal.    BP (!) 162/87   Pulse 77   Temp (!) 97.4 F (36.3 C) (Temporal)   Ht _0  (1.626 m)   Wt 155 lb (70.3 kg)   SpO2 99%   BMI 26.61 kg/m  Wt Readings from Last 3 Encounters:  08/06/21 155 lb (70.3 kg)  07/21/21 151 lb 3.2 oz (68.6 kg)  07/08/21 152 lb (68.9 kg)    Health Maintenance Due  Topic Date Due   COVID-19 Vaccine (4 - Booster for Pfizer series) 12/09/2020   INFLUENZA VACCINE  06/21/2021    There are no preventive care reminders to display for this patient.   Lab Results  Component Value Date   TSH 2.530 05/20/2020   Lab Results  Component Value Date   WBC 6.8 07/06/2021   HGB 12.2 07/06/2021   HCT 36.9 07/06/2021   MCV 102.2 (H) 07/06/2021   PLT 325 07/06/2021   Lab Results  Component Value Date   NA 142 07/21/2021   K 3.2 (L) 07/21/2021   CO2 25 07/21/2021   GLUCOSE 123 (H) 07/21/2021   BUN 10 07/21/2021   CREATININE 0.82 07/21/2021   BILITOT 1.2 07/06/2021   ALKPHOS 83 07/06/2021   AST 15 07/06/2021   ALT 13 07/06/2021   PROT 6.8 07/06/2021   ALBUMIN 3.7 07/06/2021   CALCIUM 9.9 07/21/2021   ANIONGAP 7 07/06/2021   EGFR 77 07/21/2021   Lab Results  Component Value Date   CHOL 191 06/30/2021   Lab Results  Component Value Date   HDL 48 06/30/2021   Lab Results  Component Value Date   LDLCALC 109 (H) 06/30/2021   Lab Results  Component Value Date   TRIG 197 (H) 06/30/2021   Lab Results  Component Value Date   CHOLHDL 4.0 06/30/2021   No results found for: HGBA1C     Assessment & Plan:   Problem List Items Addressed This Visit       Other   Breast lump in female - Primary    Diagnostic mammogram completed results pending.  Encourage patient to apply cool compress, Tylenol or ibuprofen as tolerated for pain.  Patient knows to follow-up with worsening or unresolved symptoms.      Relevant Orders   MM Digital Diagnostic Bilat     No orders of the defined types were placed  in this  encounter.    Ivy Lynn, NP

## 2021-08-06 NOTE — Assessment & Plan Note (Signed)
Diagnostic mammogram completed results pending.  Encourage patient to apply cool compress, Tylenol or ibuprofen as tolerated for pain.  Patient knows to follow-up with worsening or unresolved symptoms.

## 2021-08-12 ENCOUNTER — Other Ambulatory Visit (HOSPITAL_COMMUNITY): Payer: Self-pay | Admitting: Nurse Practitioner

## 2021-08-12 DIAGNOSIS — N63 Unspecified lump in unspecified breast: Secondary | ICD-10-CM

## 2021-08-24 ENCOUNTER — Ambulatory Visit (HOSPITAL_COMMUNITY)
Admission: RE | Admit: 2021-08-24 | Discharge: 2021-08-24 | Disposition: A | Discharge status: Home / Self Care | Payer: Medicare HMO | Source: Ambulatory Visit | Attending: Neurology | Admitting: Neurology

## 2021-08-24 ENCOUNTER — Other Ambulatory Visit (HOSPITAL_COMMUNITY): Payer: Self-pay | Admitting: Nurse Practitioner

## 2021-08-24 ENCOUNTER — Ambulatory Visit (HOSPITAL_COMMUNITY)
Admission: RE | Admit: 2021-08-24 | Discharge: 2021-08-24 | Disposition: A | Discharge status: Home / Self Care | Payer: Medicare HMO | Source: Ambulatory Visit | Attending: Nurse Practitioner | Admitting: Nurse Practitioner

## 2021-08-24 ENCOUNTER — Other Ambulatory Visit (HOSPITAL_COMMUNITY): Payer: Self-pay | Admitting: Orthopaedic Surgery

## 2021-08-24 ENCOUNTER — Other Ambulatory Visit: Payer: Self-pay

## 2021-08-24 DIAGNOSIS — R928 Other abnormal and inconclusive findings on diagnostic imaging of breast: Secondary | ICD-10-CM

## 2021-08-24 DIAGNOSIS — R202 Paresthesia of skin: Secondary | ICD-10-CM

## 2021-08-24 DIAGNOSIS — N6311 Unspecified lump in the right breast, upper outer quadrant: Secondary | ICD-10-CM | POA: Insufficient documentation

## 2021-08-24 DIAGNOSIS — N63 Unspecified lump in unspecified breast: Secondary | ICD-10-CM

## 2021-08-24 DIAGNOSIS — N6321 Unspecified lump in the left breast, upper outer quadrant: Secondary | ICD-10-CM | POA: Diagnosis not present

## 2021-08-24 DIAGNOSIS — R922 Inconclusive mammogram: Secondary | ICD-10-CM | POA: Diagnosis not present

## 2021-08-24 DIAGNOSIS — N6322 Unspecified lump in the left breast, upper inner quadrant: Secondary | ICD-10-CM | POA: Diagnosis not present

## 2021-08-24 DIAGNOSIS — R519 Headache, unspecified: Secondary | ICD-10-CM | POA: Diagnosis not present

## 2021-08-24 IMAGING — DX DG SKULL COMPLETE 4+V
5 series · 5 of 5 positions shown · non-contrast
Comparison: None.

CLINICAL DATA: 69-year-old female with left side pain and
tenderness for 2-3 months. Paresthesia. No known injury.

EXAM:
SKULL - COMPLETE 4 + VIEW

[skull [person_name]]
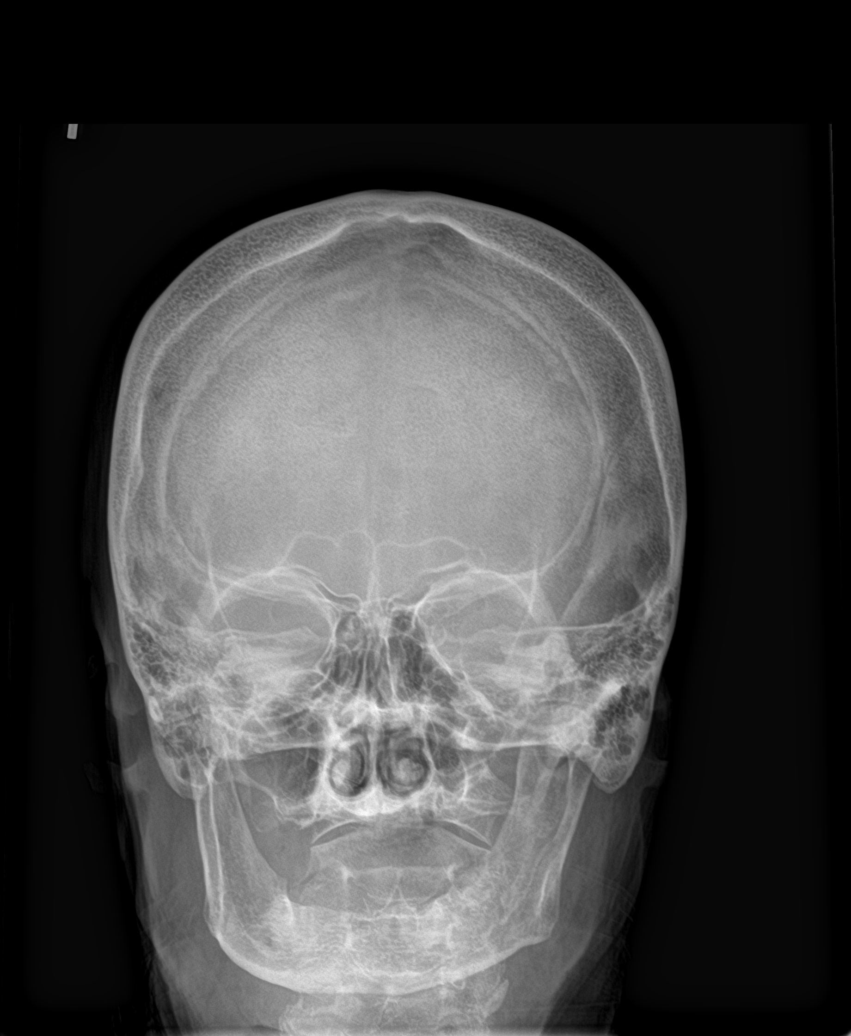

[skull pa]
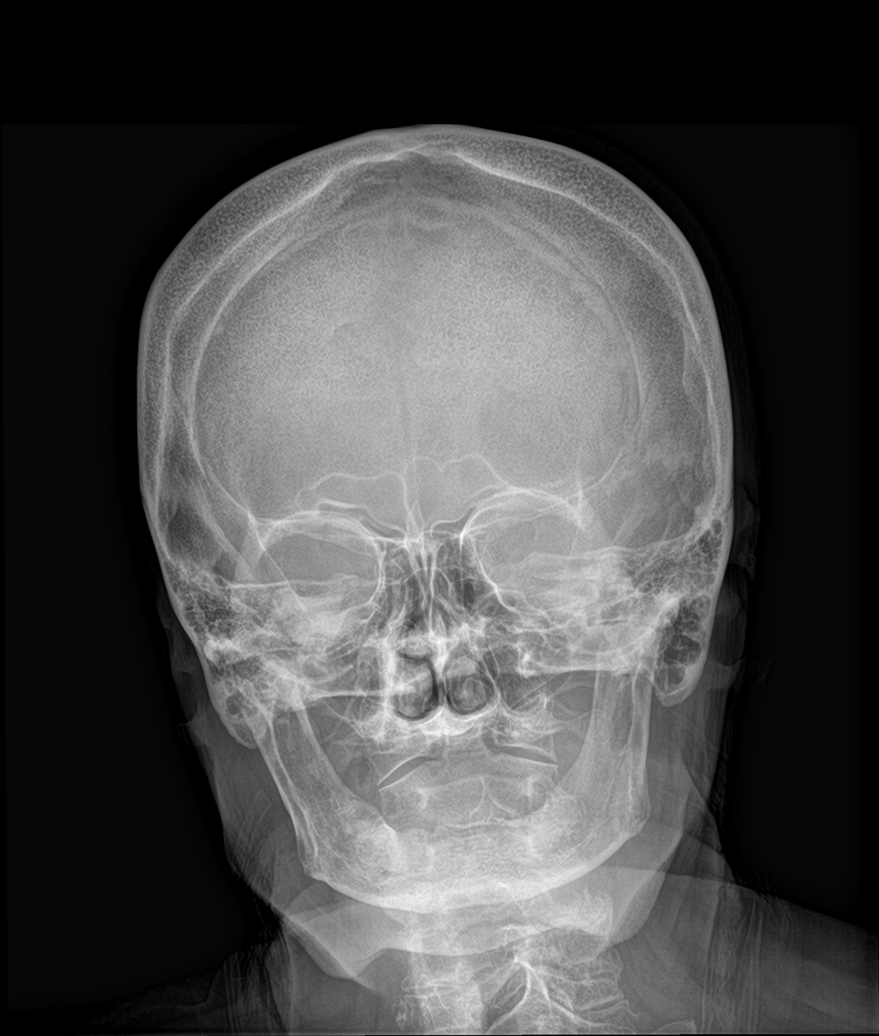

[skull townes]
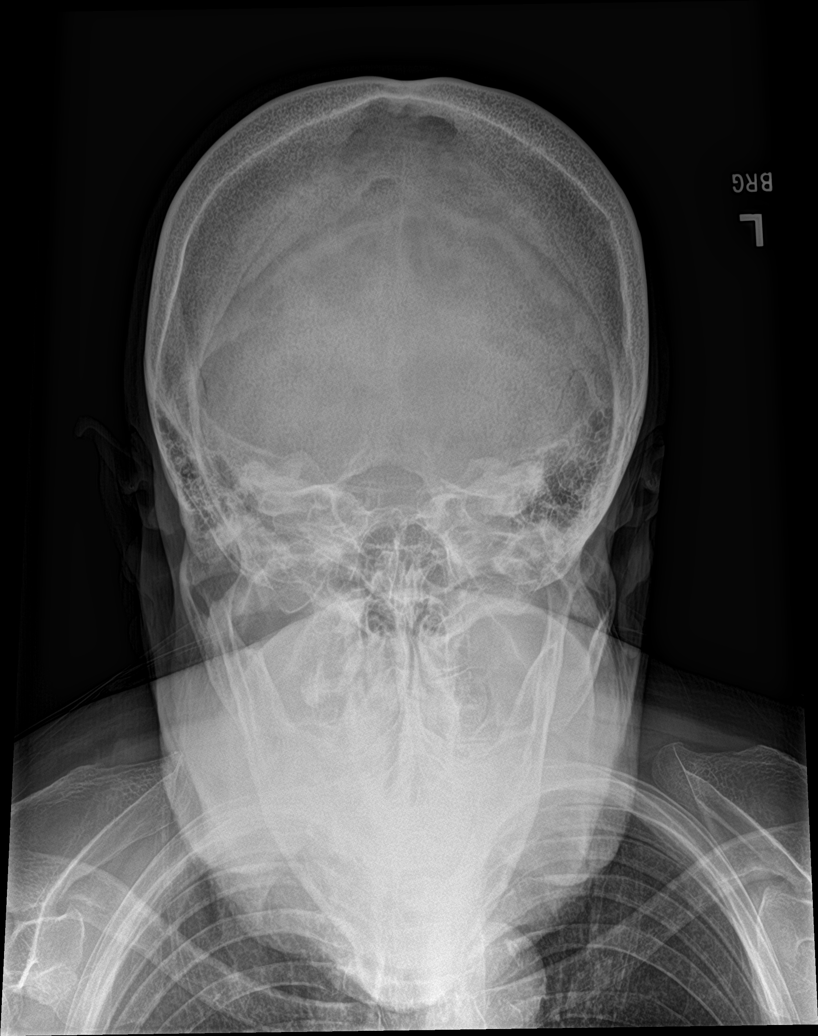

[skull lat (1 of 2)]
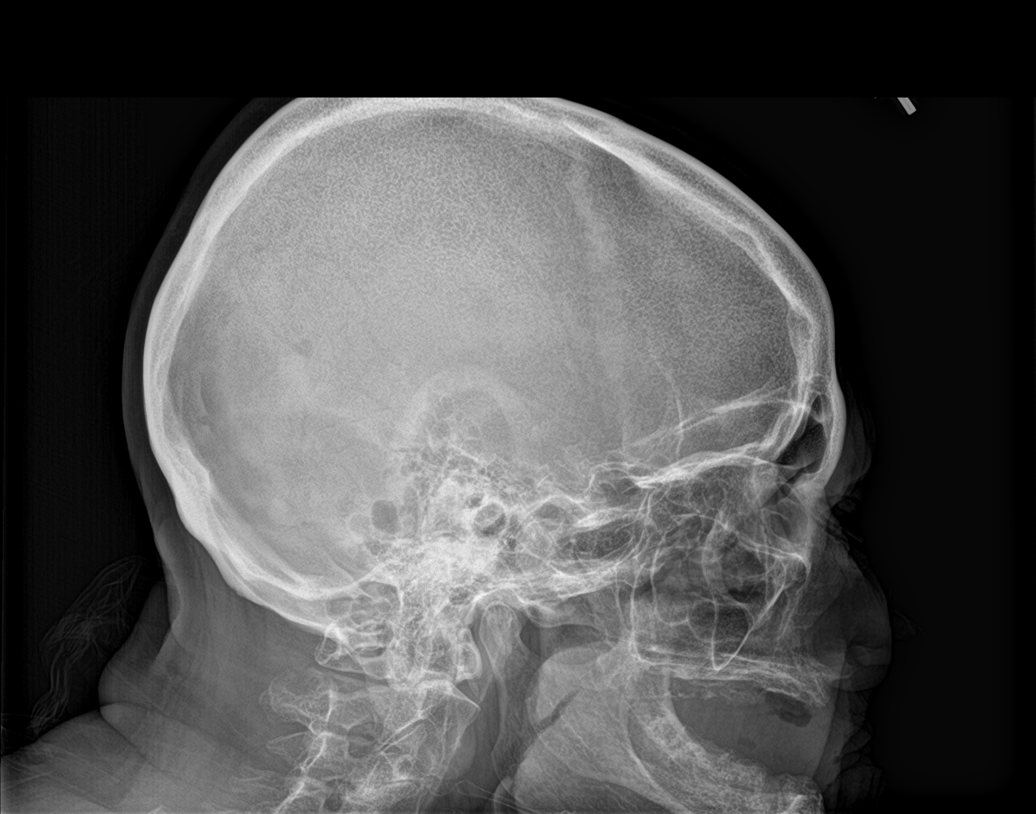

[skull lat (2 of 2)]
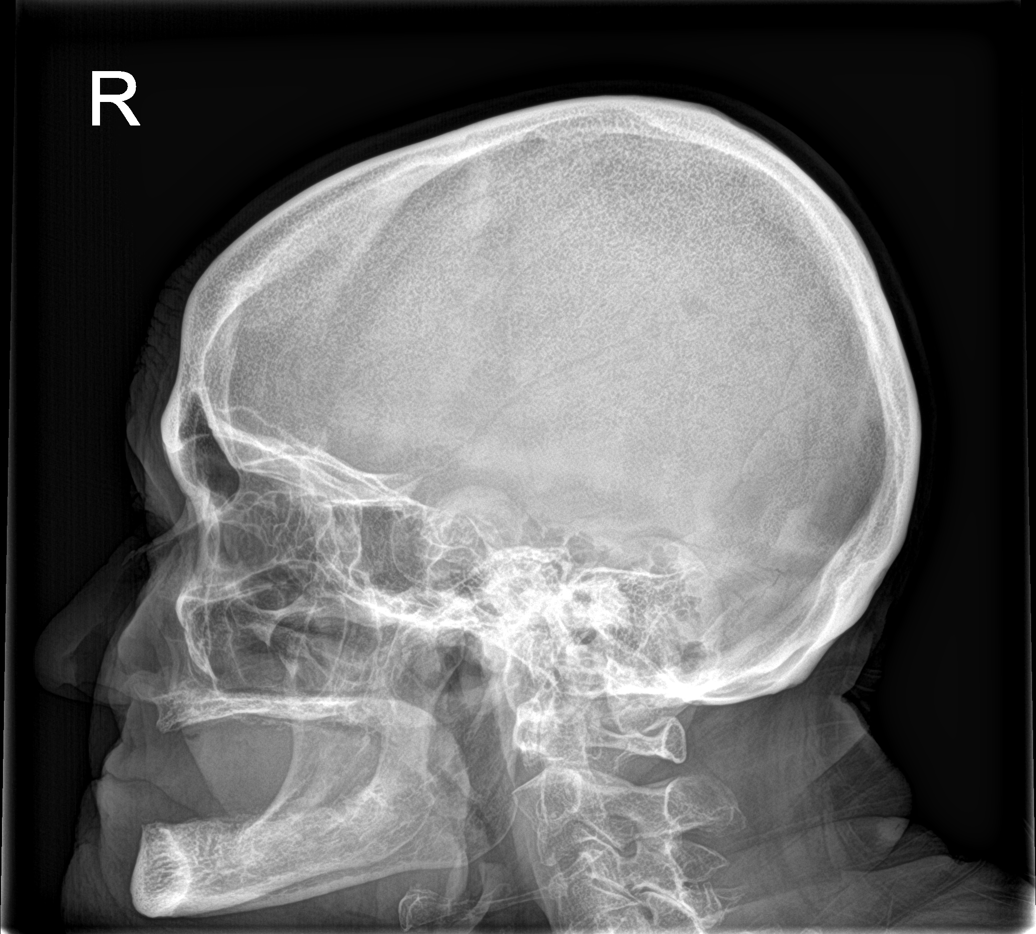

[5 of 5 positions shown; findings below may reference images not displayed]

FINDINGS: Five radiographic views of the skull. Bone mineralization is within
normal limits. The calvarium appears intact and within normal
limits. Absent dentition. Facial bones appear symmetric and within
normal limits. Paranasal sinuses and mastoids appear symmetrically
well aerated. No acute osseous abnormality identified. Negative
visible lung apices.
IMPRESSION: Negative radiographic appearance of the skull.
If there is a discrete scalp lesion or if pain symptoms persist then
Head CT without contrast would better evaluate.

## 2021-08-24 IMAGING — MG DIGITAL DIAGNOSTIC BILAT W/ TOMO W/ CAD
8 of 15 series · 8 of 40 positions shown · non-contrast
Comparison: Previous exams.

CLINICAL DATA: 69-year-old female with 2 palpable areas of concern
in the left breast.



[L MLO synth-2D (1 of 2)]
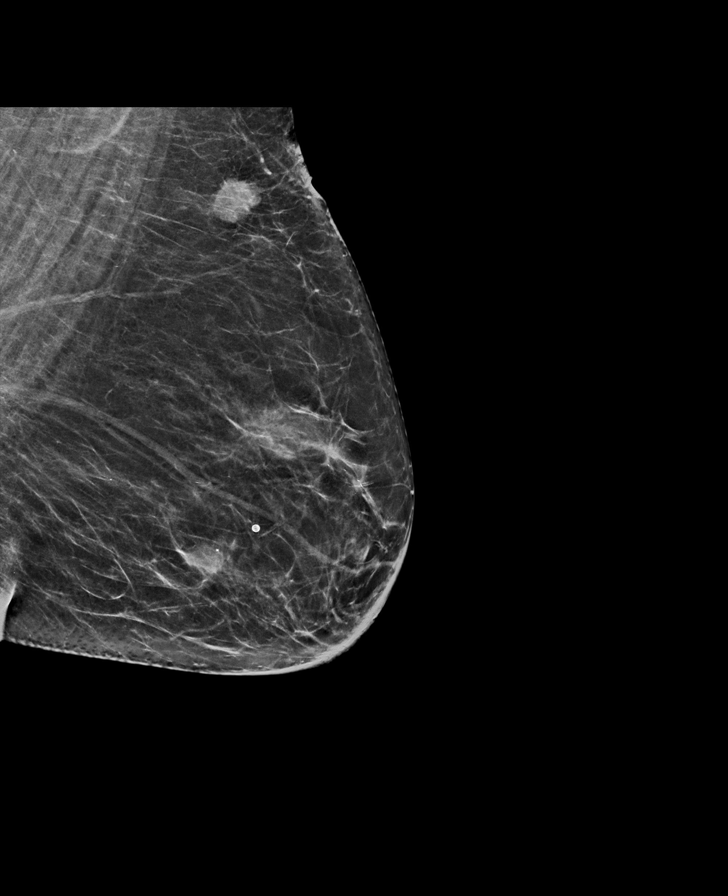

[L MLO synth-2D (2 of 2)]
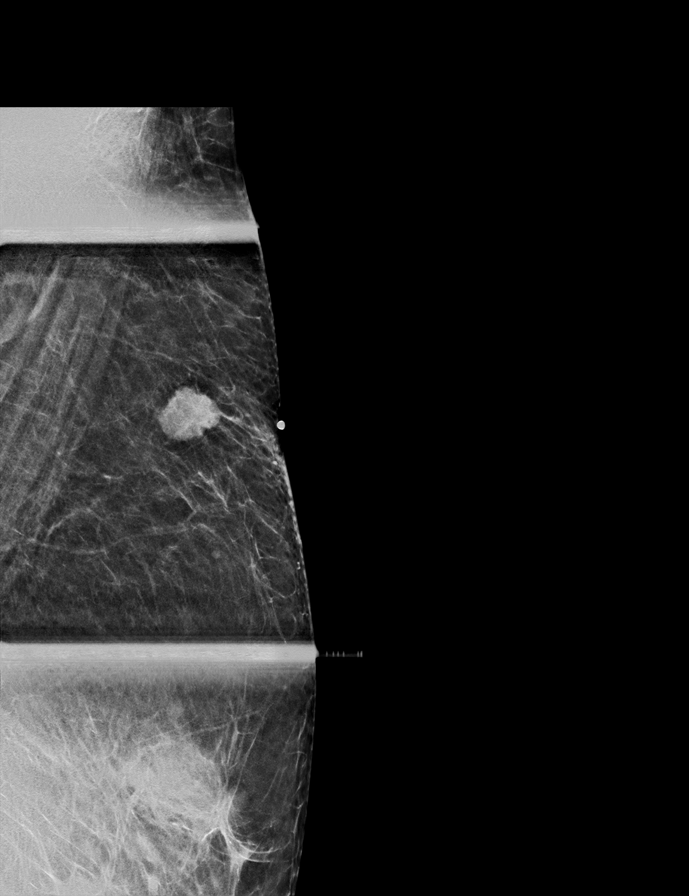

[R CC synth-2D (1 of 2)]
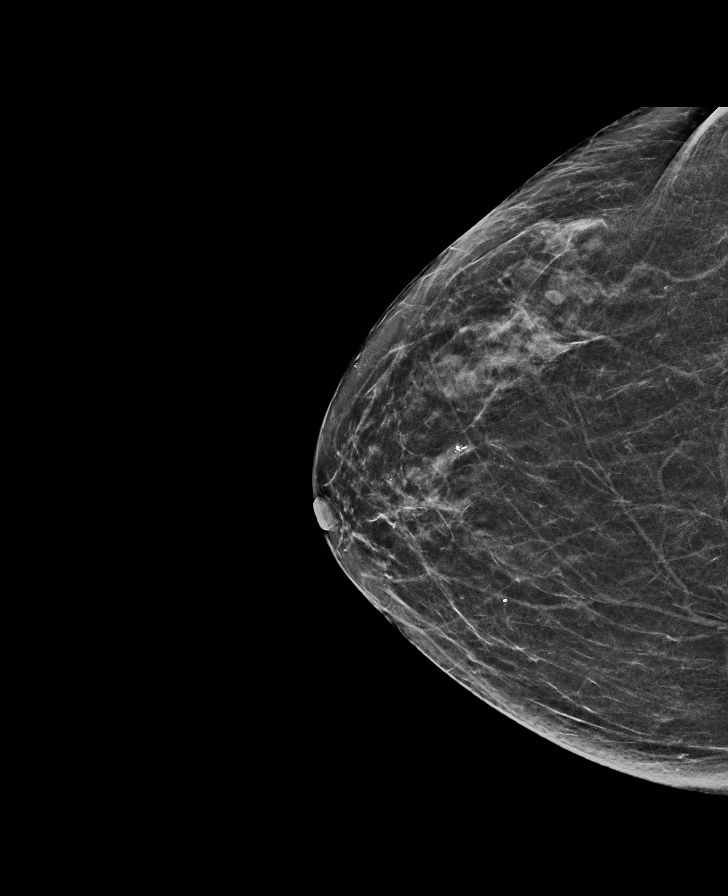

[L CC synth-2D (1 of 2)]
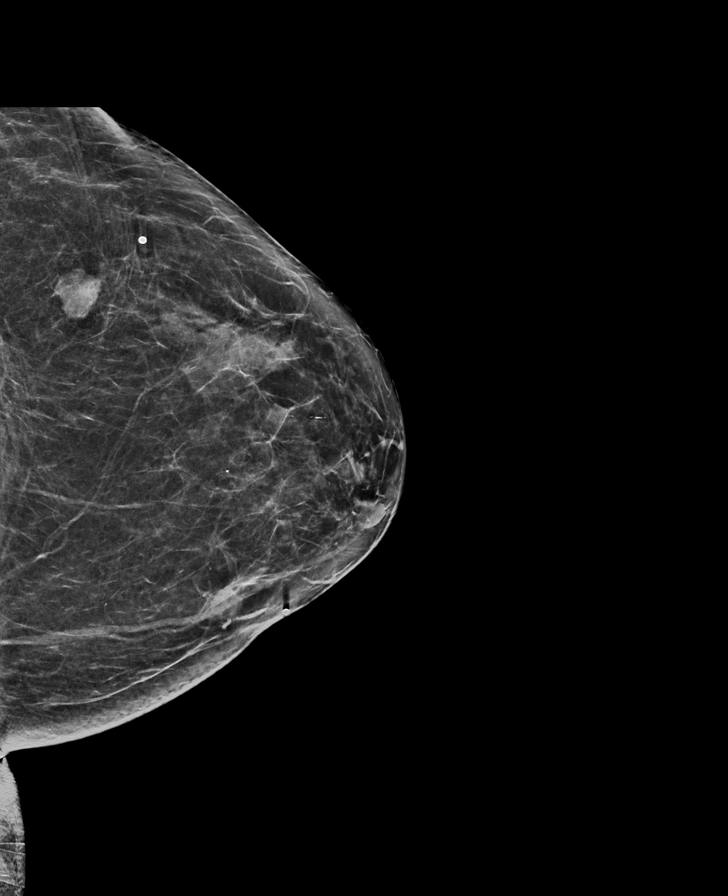

[L CC synth-2D (2 of 2)]
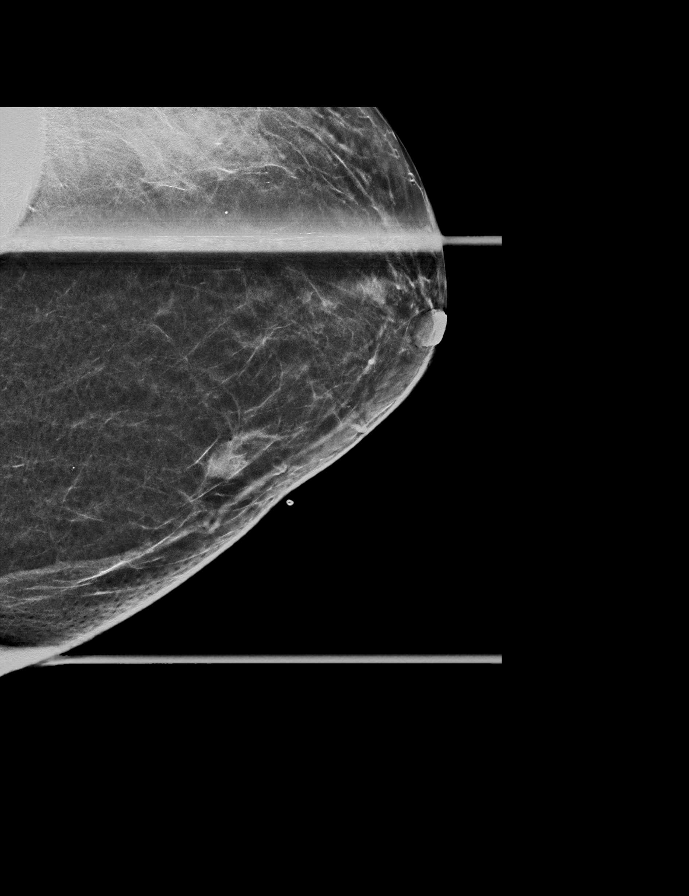

[R MLO synth-2D]
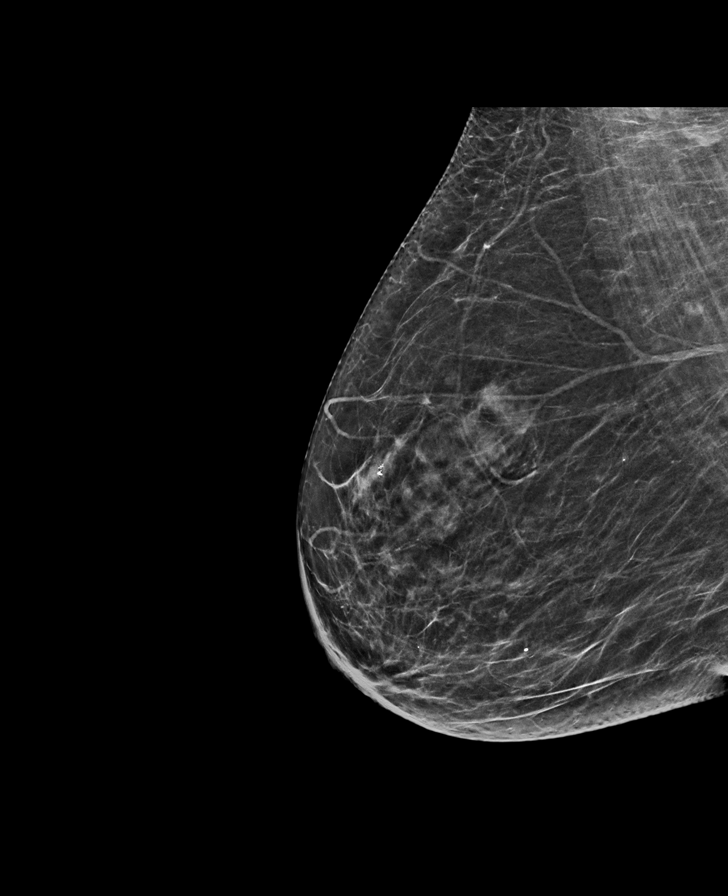

[R CC synth-2D (2 of 2)]
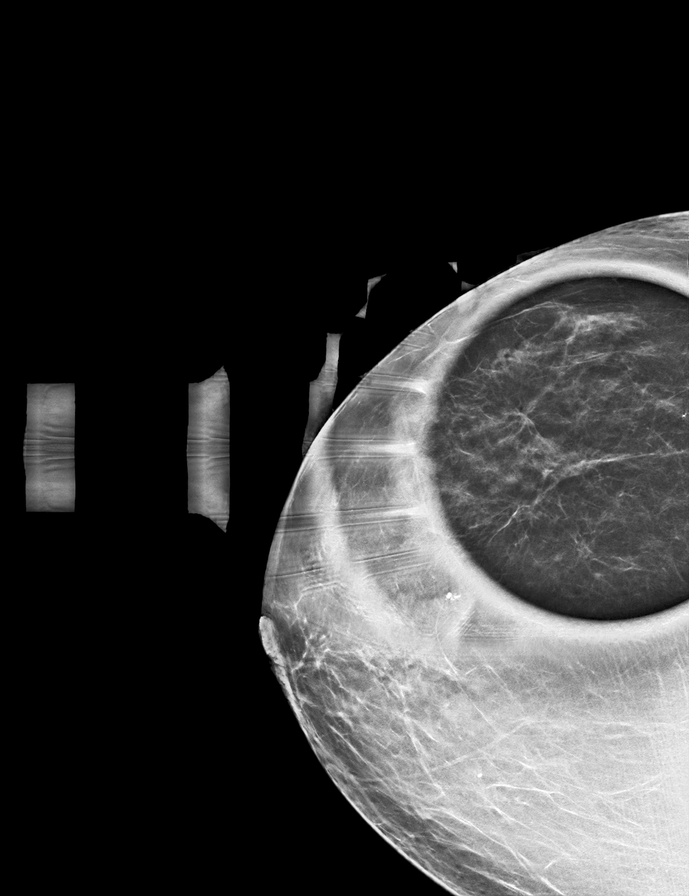

[L MLO tomo · tomo slice 46/67.0]
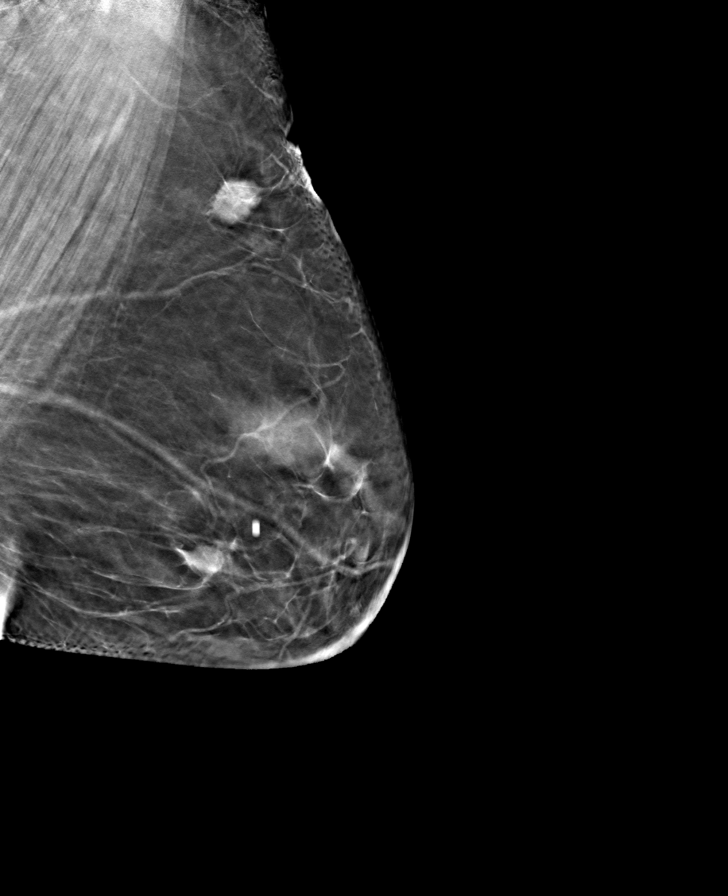

[8 of 40 positions shown; findings below may reference images not displayed]

ACR Breast Density Category b: There are scattered areas of
fibroglandular density.
FINDINGS: Spot compression tomograms were performed over the palpable areas of
concern in the left breast. There is an irregular mass in the
slightly inner left breast measuring approximately 1 cm. There is an
irregular mass in the upper-outer left breast at the additional site
of palpable concern measuring 1.4 cm. There is a subtle area of
possible distortion in the upper-outer right breast which is felt to
persist on the additional spot compression CC as well as full paddle
mL tomograms.

Targeted ultrasound of the left breast was performed. There is an
irregular hypoechoic mass in the left breast at 10 o'clock 2 cm from
nipple measuring 0.9 x 0.8 x 1 cm. This corresponds well with the
mass seen in the inner left breast at mammography. There is an
irregular hypoechoic mass in the left breast at 12 o'clock 7 cm from
nipple measuring 1.3 x 1.3 x 1.1 cm. This corresponds well with the
regular mass seen in the upper slightly outer left breast at
mammography. No lymphadenopathy seen in the left axilla.

Targeted ultrasound of the right breast was performed. There is an
irregular hypoechoic shadowing mass at 10 o'clock 7 cm from nipple
measuring 0.5 x 0.7 x 0.4 cm. This corresponds well with the
distortion seen in the upper-outer right breast at mammography. No
lymphadenopathy seen in the right axilla.
IMPRESSION: 1. Suspicious palpable 0.9 cm mass in the left breast at the 10
o'clock position.

2. Suspicious palpable 1.3 cm mass in the left breast at the 12
o'clock position.

3. Suspicious 0.7 cm mass in the right breast at the 10 o'clock
position.

RECOMMENDATION:
Recommend ultrasound-guided core biopsy of the masses in the left
breast at the 10 o'clock and 12 o'clock positioned and
ultrasound-guided core biopsy of the mass in the right breast at the
10 o'clock position.

I have discussed the findings and recommendations with the patient.
If applicable, a reminder letter will be sent to the patient
regarding the next appointment.

BI-RADS CATEGORY  5: Highly suggestive of malignancy.

## 2021-08-24 IMAGING — US US BREAST*L* LIMITED INC AXILLA
1 series · 13 of 25 positions shown · non-contrast
Comparison: Previous exams.

CLINICAL DATA: 69-year-old female with 2 palpable areas of concern
in the left breast.



[Series 1: us breast*left* limited inc axilla · 0.07mm/px · 13 of 36 slices shown]
[im 1/36]
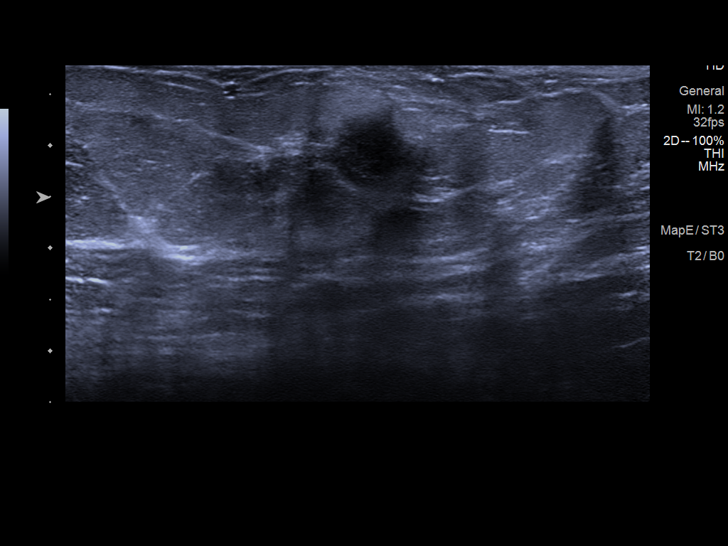
[im 3/36]
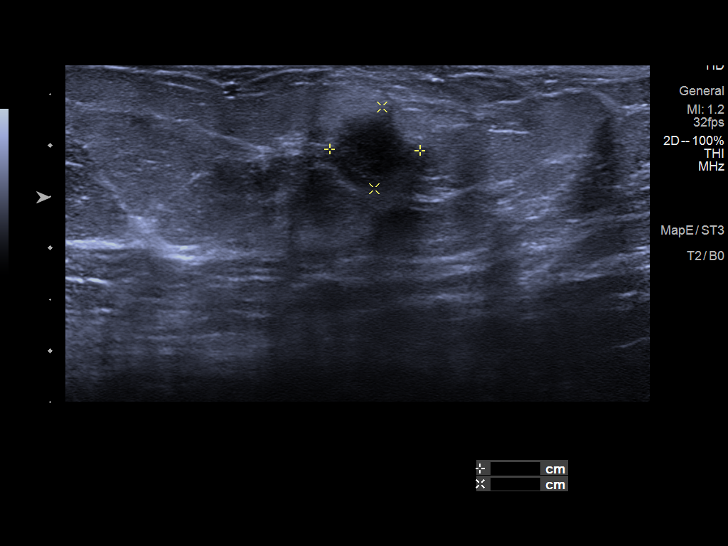
[im 6/36]
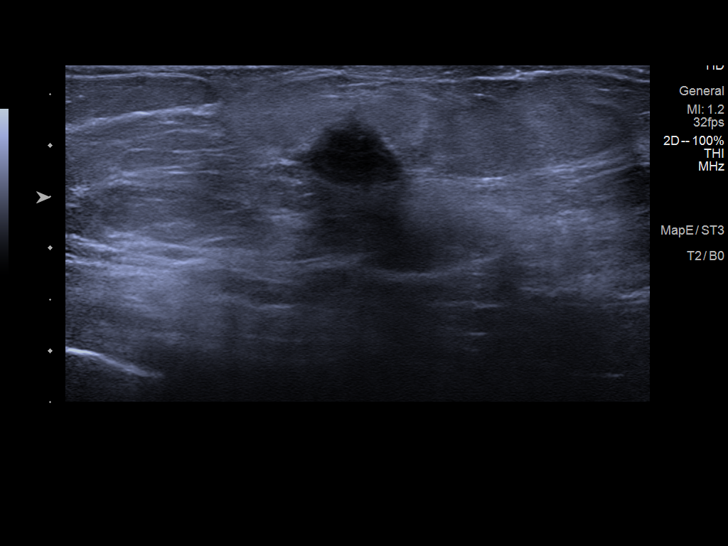
[im 9/36]
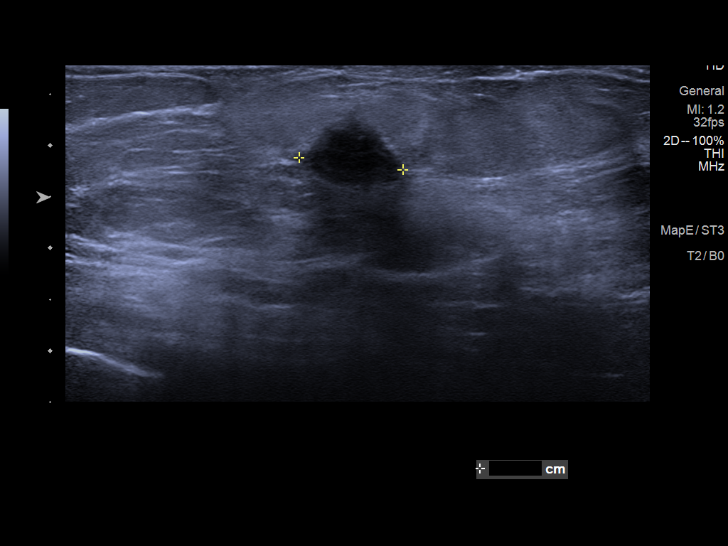
[im 12/36]
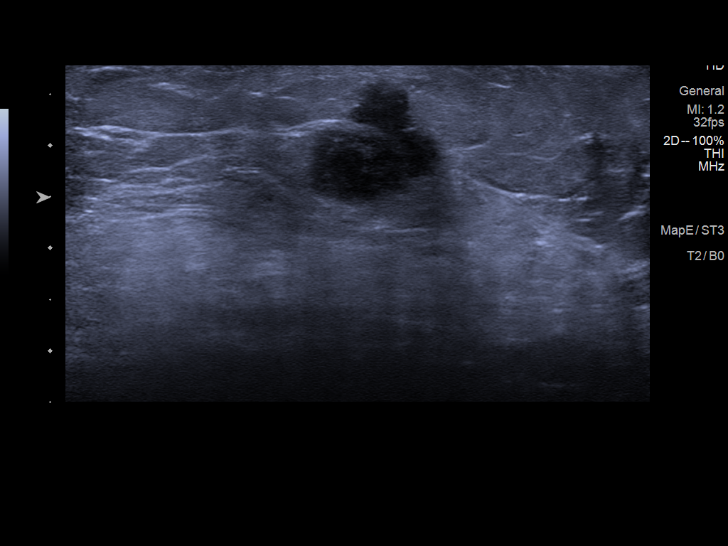
[im 15/36]
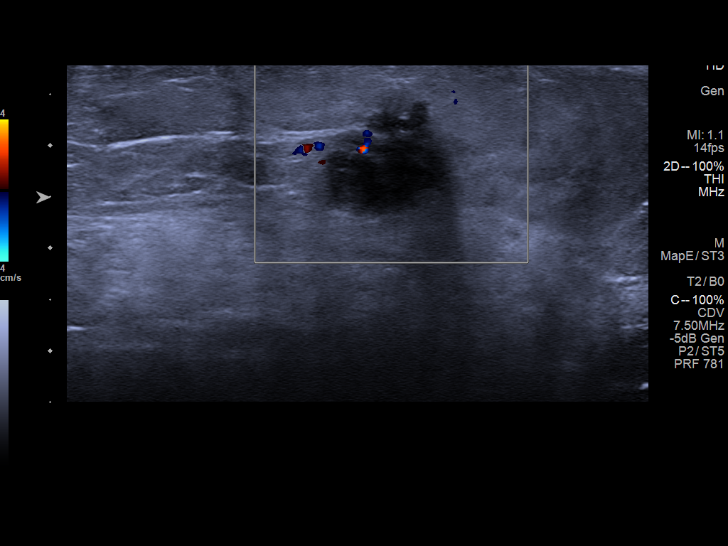
[im 18/36]
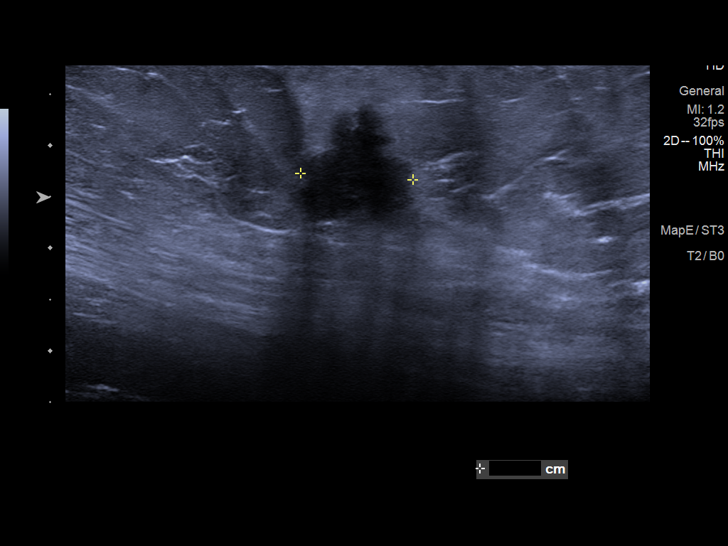
[im 21/36]
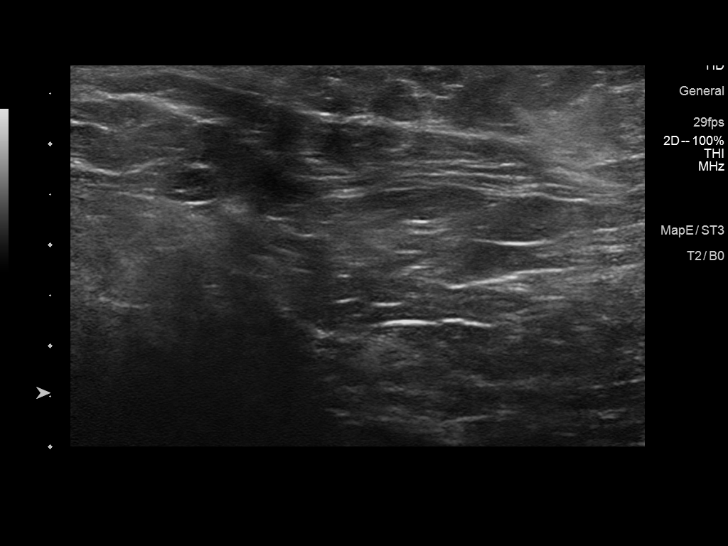
[im 24/36]
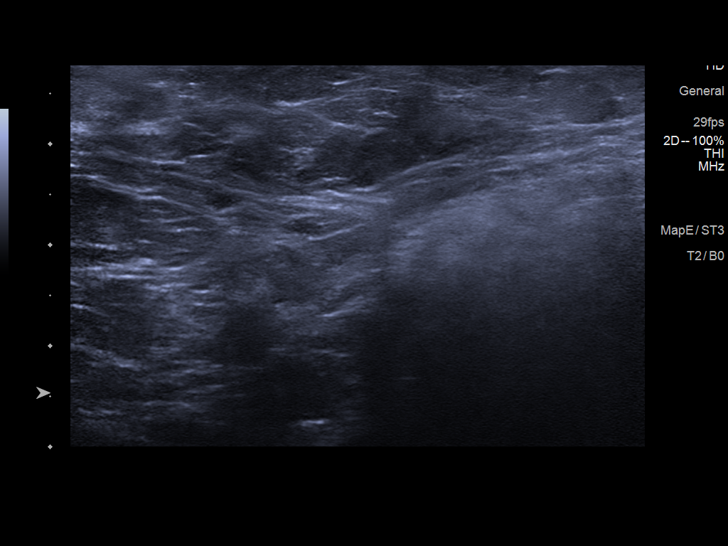
[im 27/36]
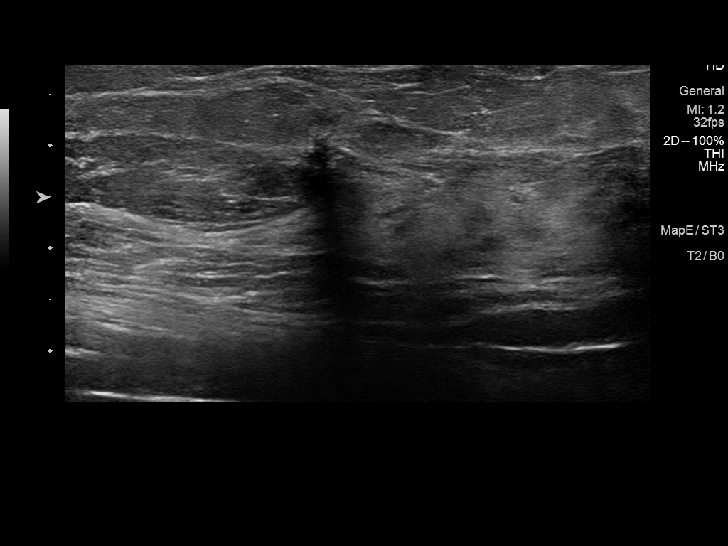
[im 30/36]
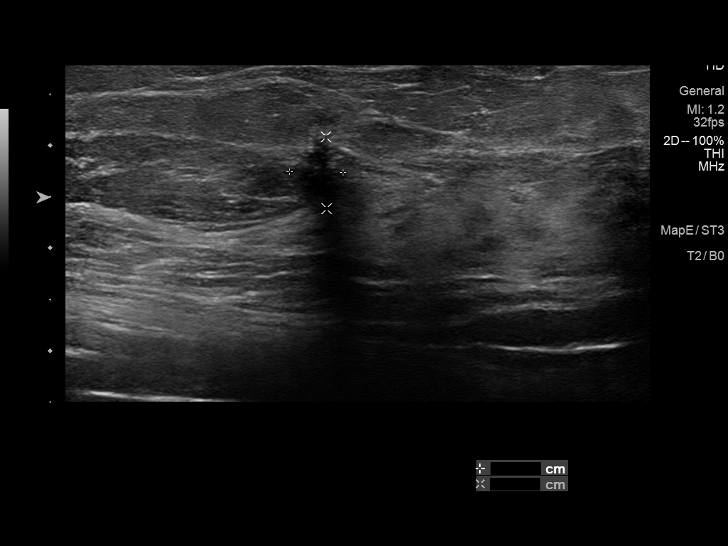
[im 33/36]
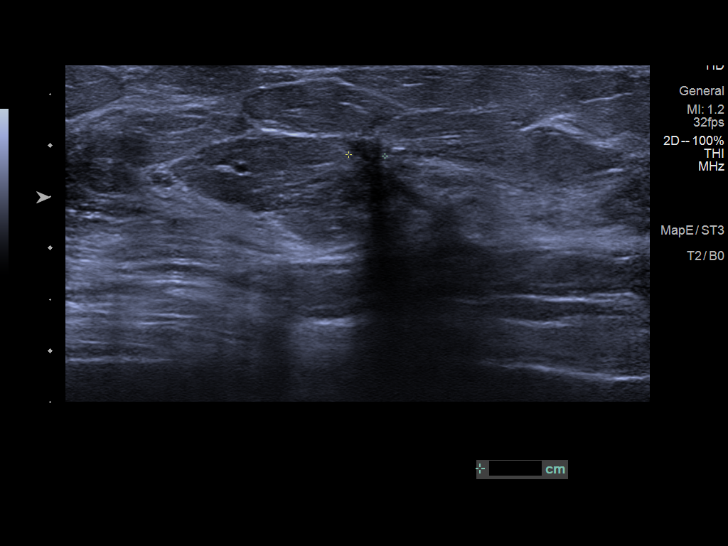
[im 36/36]
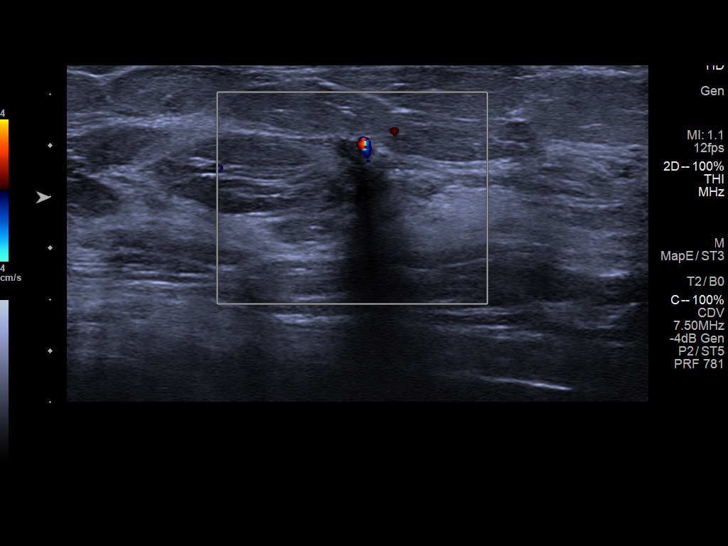

[13 of 25 positions shown; findings below may reference images not displayed]

ACR Breast Density Category b: There are scattered areas of
fibroglandular density.
FINDINGS: Spot compression tomograms were performed over the palpable areas of
concern in the left breast. There is an irregular mass in the
slightly inner left breast measuring approximately 1 cm. There is an
irregular mass in the upper-outer left breast at the additional site
of palpable concern measuring 1.4 cm. There is a subtle area of
possible distortion in the upper-outer right breast which is felt to
persist on the additional spot compression CC as well as full paddle
mL tomograms.

Targeted ultrasound of the left breast was performed. There is an
irregular hypoechoic mass in the left breast at 10 o'clock 2 cm from
nipple measuring 0.9 x 0.8 x 1 cm. This corresponds well with the
mass seen in the inner left breast at mammography. There is an
irregular hypoechoic mass in the left breast at 12 o'clock 7 cm from
nipple measuring 1.3 x 1.3 x 1.1 cm. This corresponds well with the
regular mass seen in the upper slightly outer left breast at
mammography. No lymphadenopathy seen in the left axilla.

Targeted ultrasound of the right breast was performed. There is an
irregular hypoechoic shadowing mass at 10 o'clock 7 cm from nipple
measuring 0.5 x 0.7 x 0.4 cm. This corresponds well with the
distortion seen in the upper-outer right breast at mammography. No
lymphadenopathy seen in the right axilla.
IMPRESSION: 1. Suspicious palpable 0.9 cm mass in the left breast at the 10
o'clock position.

2. Suspicious palpable 1.3 cm mass in the left breast at the 12
o'clock position.

3. Suspicious 0.7 cm mass in the right breast at the 10 o'clock
position.

RECOMMENDATION:
Recommend ultrasound-guided core biopsy of the masses in the left
breast at the 10 o'clock and 12 o'clock positioned and
ultrasound-guided core biopsy of the mass in the right breast at the
10 o'clock position.

I have discussed the findings and recommendations with the patient.
If applicable, a reminder letter will be sent to the patient
regarding the next appointment.

BI-RADS CATEGORY  5: Highly suggestive of malignancy.

## 2021-08-24 IMAGING — US US BREAST*R* LIMITED INC AXILLA
1 series · 13 of 25 positions shown · non-contrast
Comparison: Previous exams.

CLINICAL DATA: 69-year-old female with 2 palpable areas of concern
in the left breast.



[Series 1: us breast*right* limited inc axilla · 0.07mm/px · 13 of 36 slices shown]
[im 1/36]
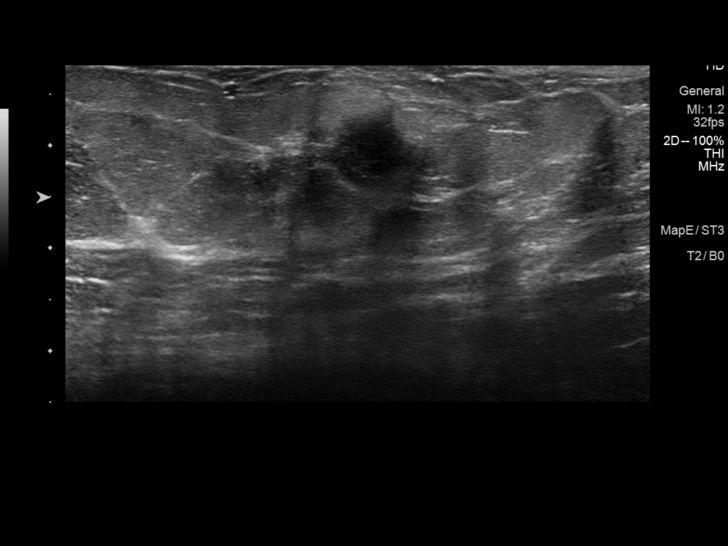
[im 3/36]
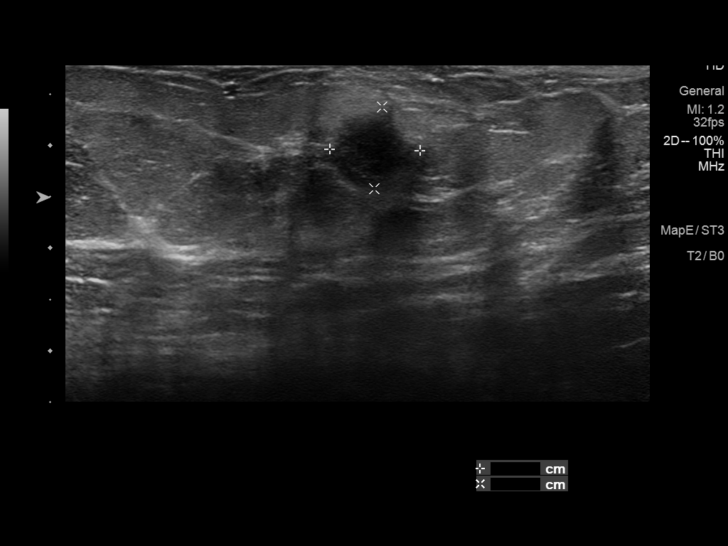
[im 6/36]
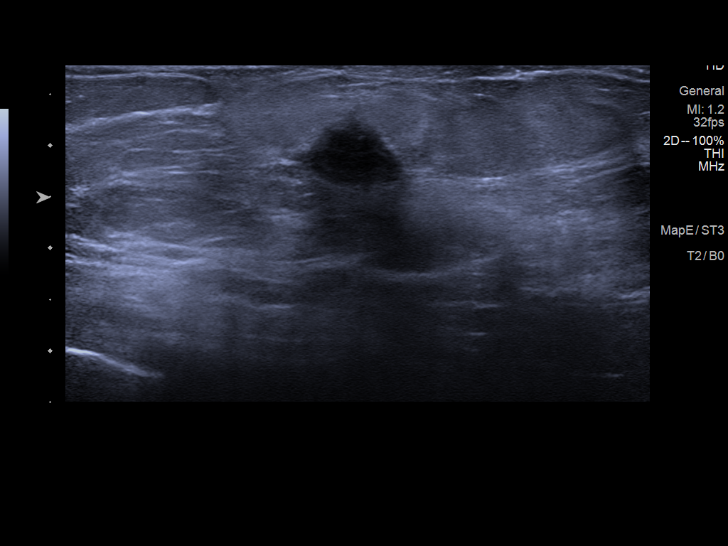
[im 9/36]
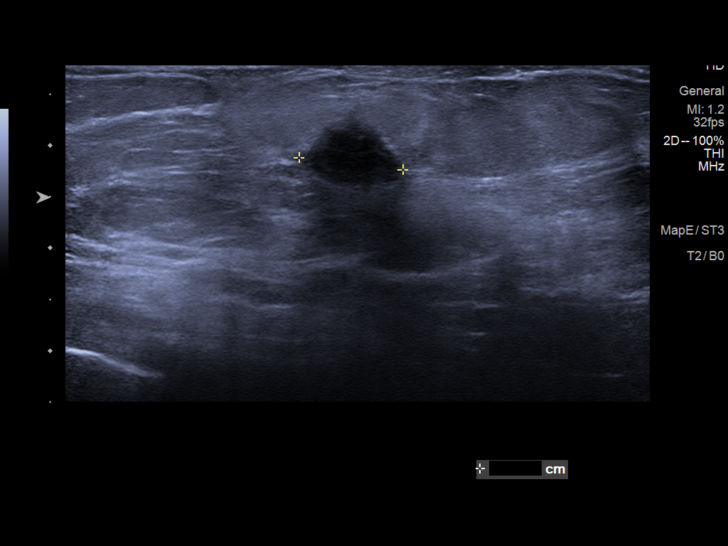
[im 12/36]
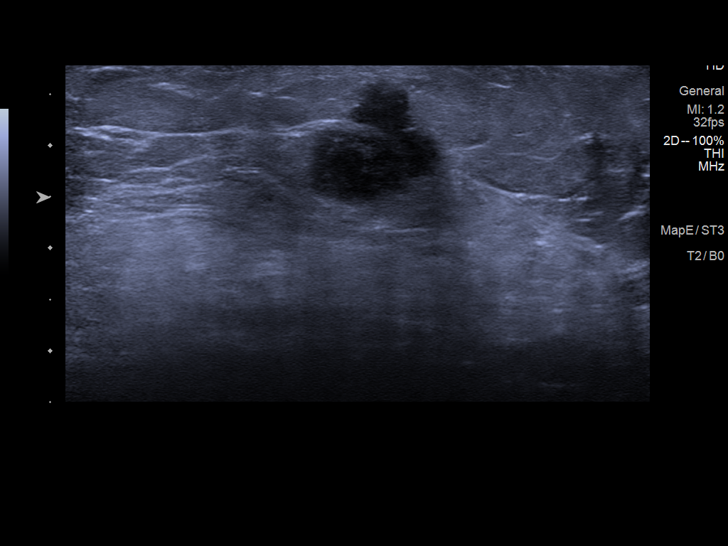
[im 15/36]
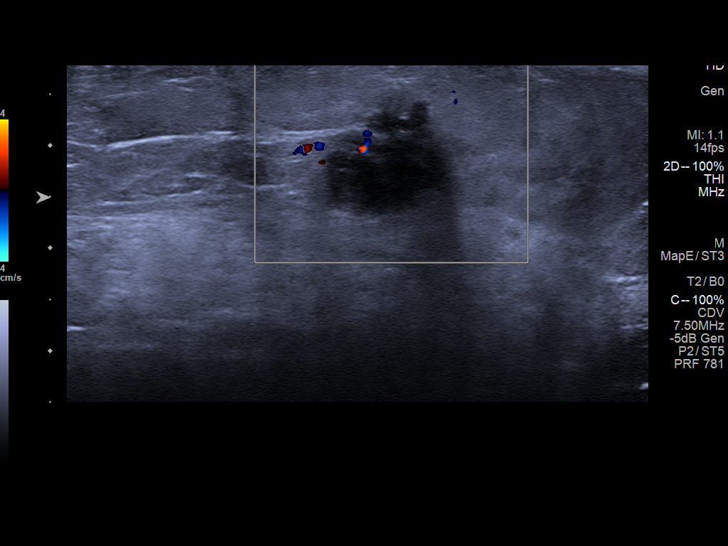
[im 18/36]
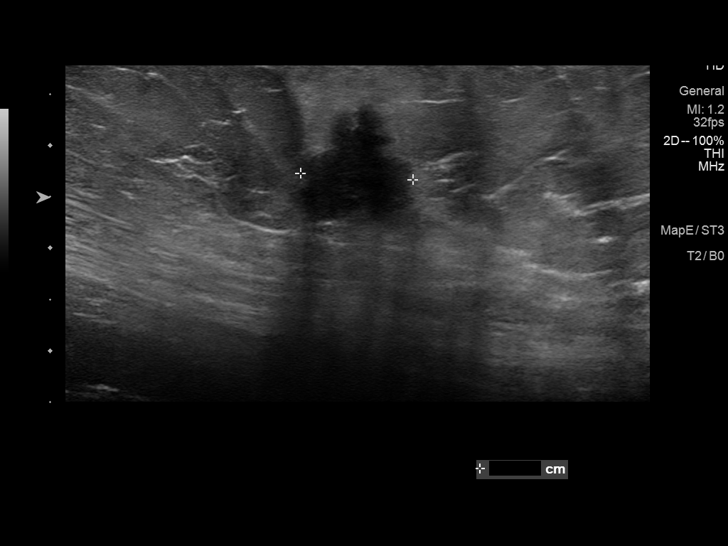
[im 21/36]
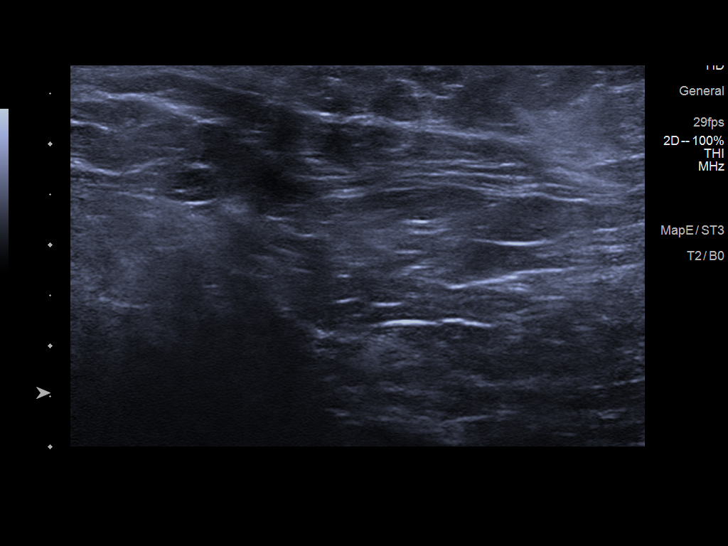
[im 24/36]
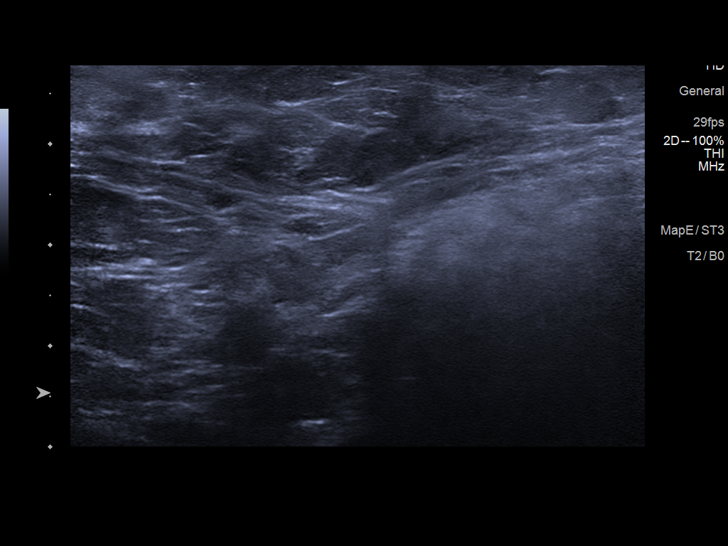
[im 27/36]
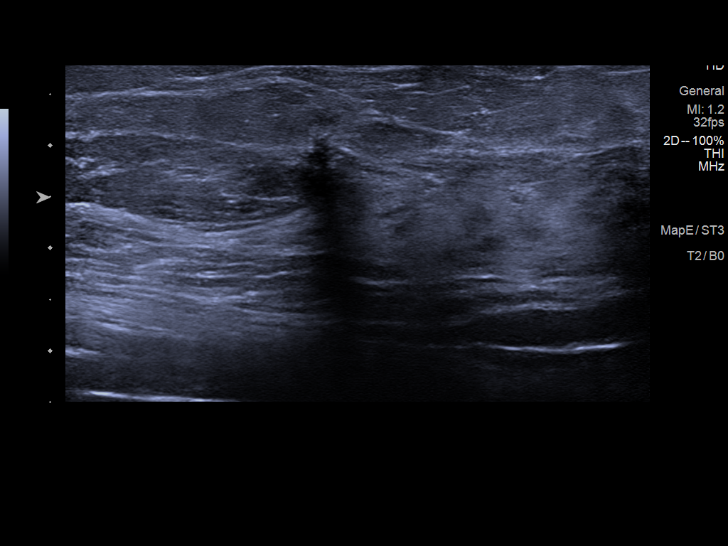
[im 30/36]
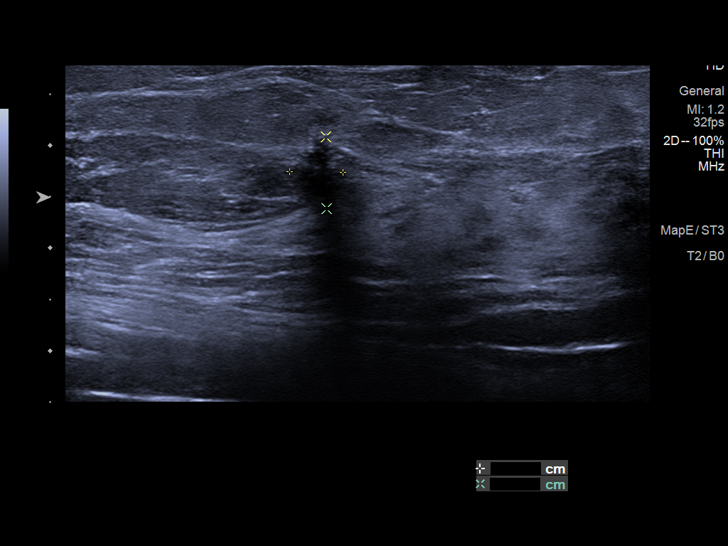
[im 33/36]
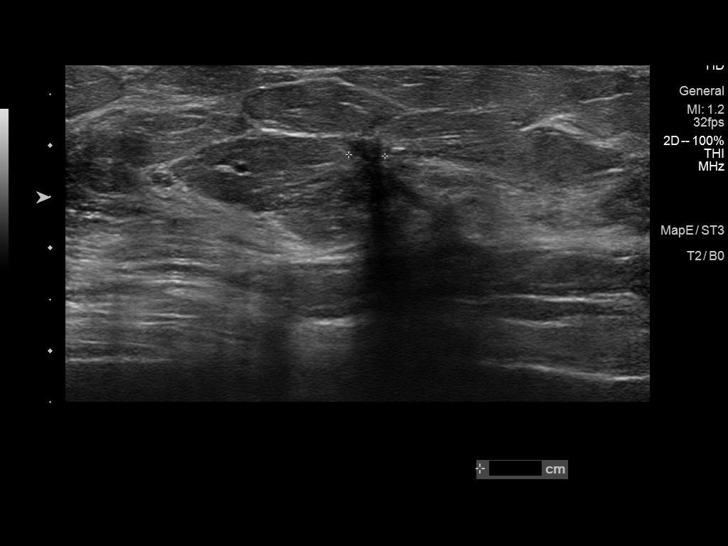
[im 36/36]
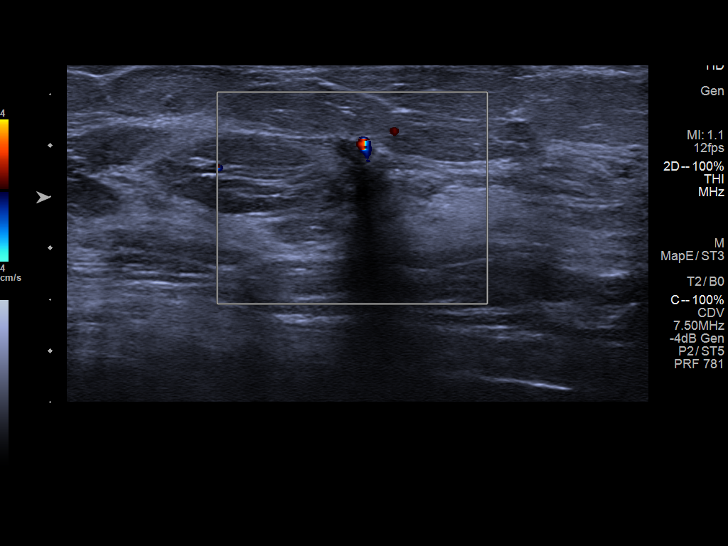

[13 of 25 positions shown; findings below may reference images not displayed]

ACR Breast Density Category b: There are scattered areas of
fibroglandular density.
FINDINGS: Spot compression tomograms were performed over the palpable areas of
concern in the left breast. There is an irregular mass in the
slightly inner left breast measuring approximately 1 cm. There is an
irregular mass in the upper-outer left breast at the additional site
of palpable concern measuring 1.4 cm. There is a subtle area of
possible distortion in the upper-outer right breast which is felt to
persist on the additional spot compression CC as well as full paddle
mL tomograms.

Targeted ultrasound of the left breast was performed. There is an
irregular hypoechoic mass in the left breast at 10 o'clock 2 cm from
nipple measuring 0.9 x 0.8 x 1 cm. This corresponds well with the
mass seen in the inner left breast at mammography. There is an
irregular hypoechoic mass in the left breast at 12 o'clock 7 cm from
nipple measuring 1.3 x 1.3 x 1.1 cm. This corresponds well with the
regular mass seen in the upper slightly outer left breast at
mammography. No lymphadenopathy seen in the left axilla.

Targeted ultrasound of the right breast was performed. There is an
irregular hypoechoic shadowing mass at 10 o'clock 7 cm from nipple
measuring 0.5 x 0.7 x 0.4 cm. This corresponds well with the
distortion seen in the upper-outer right breast at mammography. No
lymphadenopathy seen in the right axilla.
IMPRESSION: 1. Suspicious palpable 0.9 cm mass in the left breast at the 10
o'clock position.

2. Suspicious palpable 1.3 cm mass in the left breast at the 12
o'clock position.

3. Suspicious 0.7 cm mass in the right breast at the 10 o'clock
position.

RECOMMENDATION:
Recommend ultrasound-guided core biopsy of the masses in the left
breast at the 10 o'clock and 12 o'clock positioned and
ultrasound-guided core biopsy of the mass in the right breast at the
10 o'clock position.

I have discussed the findings and recommendations with the patient.
If applicable, a reminder letter will be sent to the patient
regarding the next appointment.

BI-RADS CATEGORY  5: Highly suggestive of malignancy.

## 2021-08-26 ENCOUNTER — Encounter: Payer: Self-pay | Admitting: Orthopaedic Surgery

## 2021-08-26 ENCOUNTER — Ambulatory Visit (INDEPENDENT_AMBULATORY_CARE_PROVIDER_SITE_OTHER): Payer: Medicare HMO | Admitting: Orthopaedic Surgery

## 2021-08-26 ENCOUNTER — Ambulatory Visit: Payer: Self-pay

## 2021-08-26 ENCOUNTER — Other Ambulatory Visit: Payer: Self-pay

## 2021-08-26 VITALS — Ht 64.0 in | Wt 153.0 lb

## 2021-08-26 DIAGNOSIS — G8929 Other chronic pain: Secondary | ICD-10-CM

## 2021-08-26 DIAGNOSIS — M545 Low back pain, unspecified: Secondary | ICD-10-CM

## 2021-08-26 DIAGNOSIS — S32691A Other specified fracture of right ischium, initial encounter for closed fracture: Secondary | ICD-10-CM | POA: Diagnosis not present

## 2021-08-26 MED ORDER — ACETAMINOPHEN-CODEINE #3 300-30 MG PO TABS: 1.0000 | ORAL_TABLET | Freq: Three times a day (TID) | ORAL | 0 refills | Status: DC | PRN

## 2021-08-26 NOTE — Progress Notes (Signed)
Office Visit Note   Patient: Elizabeth Wu           Date of Birth: Jan 17, 1951           MRN: [id number] Visit Date: 08/26/2021              Requested by: [name], DO [place],  [place] [id number] PCP: [name], DO   Assessment & Plan: Visit Diagnoses:  1. Other closed fracture of right ischium, initial encounter ([place])   2. Chronic right-sided low back pain, unspecified whether sciatica present     Plan: Persistent hip pain with abnormal MRI July 12th 2022.  Patient is got increased symptoms.  Will obtain a lumbar MRI make sure she does not have any compressive lesions on the right side corresponds with her pain.  She is gotten worse since July.  Office follow-up after scan.  Follow-Up Instructions: No follow-ups on file.   Orders:  Orders Placed This Encounter  Procedures   XR Lumbar Spine 2-3 Views   XR Pelvis 1-2 Views   MR Lumbar Spine w/o contrast   Meds ordered this encounter  Medications   acetaminophen-codeine (TYLENOL #3) 300-30 MG tablet    Sig: Take 1 tablet by mouth every 8 (eight) hours as needed for moderate pain.    Dispense:  30 tablet    Refill:  0      Procedures: No procedures performed   Clinical Data: No additional findings.   Subjective: Chief Complaint  Patient presents with   Lower Back - Pain   Pelvis - Fracture, Follow-up, Pain    HPI 70 year old female with increasing right groin pain and hip pain.  MRI was reviewed 06/03/2021 visit which showed nondisplaced fracture junction of the posterior acetabulum and ischial tuberosity atypical for an insufficiency fracture.  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  She is getting ready to have some breast biopsies post mammogram.  No history of cancer.  Patient is using her walker she has pronounced limp sometimes pain starts when she is sitting sometimes standing she has to change positions she not been able get any relief.  Toradol did not help  ibuprofen did not help.  She now feels like she is having more back pain associated with this.  Review of Systems all the systems are negative is obtained HPI.   Objective: Vital Signs: Ht 5\' 4"  (1.626 m)   Wt 153 lb (69.4 kg)   BMI 26.26 kg/m   Physical Exam Constitutional:      Appearance: She is well-developed.  HENT:     Head: Normocephalic.     Right Ear: External ear normal.     Left Ear: External ear normal. There is no impacted cerumen.  Eyes:     Pupils: Pupils are equal, round, and reactive to light.  Neck:     Thyroid: No thyromegaly.     Trachea: No tracheal deviation.  Cardiovascular:     Rate and Rhythm: Normal rate.  Pulmonary:     Effort: Pulmonary effort is normal.  Abdominal:     Palpations: Abdomen is soft.  Musculoskeletal:     Cervical back: No rigidity.  Skin:    General: Skin is warm and dry.  Neurological:     Mental Status: She is alert and oriented to person, place, and time.  Psychiatric:        Behavior: Behavior normal.    Ortho Exam minimal right trochanteric tenderness.  Negative logroll hipsNeg straight leg raising anterior tib quads are strong.  Right more than left.  She has some tenderness in abductor.  Mild tenderness sciatic notch more tenderness between the trochanter and sciatic notch on the right.  Specialty Comments:  No specialty comments available.  Imaging: No results found.   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   Hyperlipidemia 05/31/2013   Osteopenia of multiple sites 05/31/2013   DJD  (degenerative joint disease) 05/31/2013   Past Medical History:  Diagnosis Date   Allergy    DJD (degenerative joint disease)    Hyperlipidemia    Osteopenia     Family History  Problem Relation Age of Onset   Alzheimer's disease Mother    Cancer Father        stomach cancer   Diabetes Sister    Heart disease Brother        MI    Past Surgical History:  Procedure Laterality Date   APPENDECTOMY     HERNIA REPAIR     TONSILLECTOMY     TONSILLECTOMY     Social History   Occupational History   Occupation: Sales executive  Tobacco Use   Smoking status: Every Day    Packs/day: 0.50    Years: 40.00    Pack years: 20.00    Types: Cigarettes   Smokeless tobacco: Never  Vaping Use   Vaping Use: Never used  Substance and Sexual Activity   Alcohol use: No   Drug use: No   Sexual activity: Not on file

## 2021-08-31 ENCOUNTER — Telehealth: Payer: Self-pay | Admitting: Orthopaedic Surgery

## 2021-08-31 NOTE — Telephone Encounter (Signed)
Pt called stating he sees Dr.Yates in the [place] and was supposed to have a referral for an MRI. Pt states after he made an appt for one with [facility] in [place] they called him back and said someone in the office closed out the referral and canceled the MRI. PT would like a CB to be informed why we canceled his MRI and would like to have a new one sent in.   [phone]

## 2021-09-01 NOTE — Telephone Encounter (Signed)
This has been taking care of by Terri, order has been changed, [facility] office will schedule

## 2021-09-03 ENCOUNTER — Other Ambulatory Visit (HOSPITAL_COMMUNITY): Payer: Self-pay | Admitting: Nurse Practitioner

## 2021-09-03 ENCOUNTER — Ambulatory Visit (HOSPITAL_COMMUNITY)
Admission: RE | Admit: 2021-09-03 | Discharge: 2021-09-03 | Disposition: A | Discharge status: Home / Self Care | Payer: Medicare HMO | Source: Ambulatory Visit | Attending: Nurse Practitioner | Admitting: Nurse Practitioner

## 2021-09-03 ENCOUNTER — Other Ambulatory Visit: Payer: Self-pay

## 2021-09-03 DIAGNOSIS — C50411 Malignant neoplasm of upper-outer quadrant of right female breast: Secondary | ICD-10-CM | POA: Diagnosis not present

## 2021-09-03 DIAGNOSIS — R928 Other abnormal and inconclusive findings on diagnostic imaging of breast: Secondary | ICD-10-CM | POA: Insufficient documentation

## 2021-09-03 DIAGNOSIS — C50812 Malignant neoplasm of overlapping sites of left female breast: Secondary | ICD-10-CM | POA: Diagnosis not present

## 2021-09-03 DIAGNOSIS — C50212 Malignant neoplasm of upper-inner quadrant of left female breast: Secondary | ICD-10-CM | POA: Diagnosis not present

## 2021-09-03 DIAGNOSIS — Z170 Estrogen receptor positive status [ER+]: Secondary | ICD-10-CM | POA: Diagnosis not present

## 2021-09-03 IMAGING — US US BREAST BX W LOC DEV EA ADD LESION IMG BX SPEC US GUIDE
1 series · 14 of 25 positions shown · non-contrast
Comparison: Previous exam(s).
COMPARISON: Previous exam(s).

Addendum:
CLINICAL DATA: 69-year-old female for tissue sampling of 1.3 cm
UPPER LEFT breast mass (RIBBON clip), 1 cm UPPER INNER LEFT breast
mass (WING clip) and 0.7 cm UPPER-OUTER RIGHT breast mass (RIBBON
clip).

EXAM:
ULTRASOUND GUIDED LEFT BREAST CORE NEEDLE BIOPSY X 2
ULTRASOUND GUIDED RIGHT BREAST CORE NEEDLE BIOPSY

[Series 1: us breast bx w loc dev ea add lesion img bx spec u · 0.07mm/px · 14 of 58 slices shown]
[im 1/58]
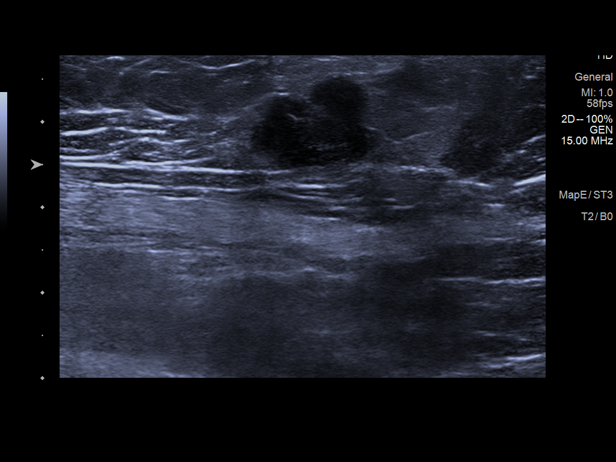
[im 5/58]
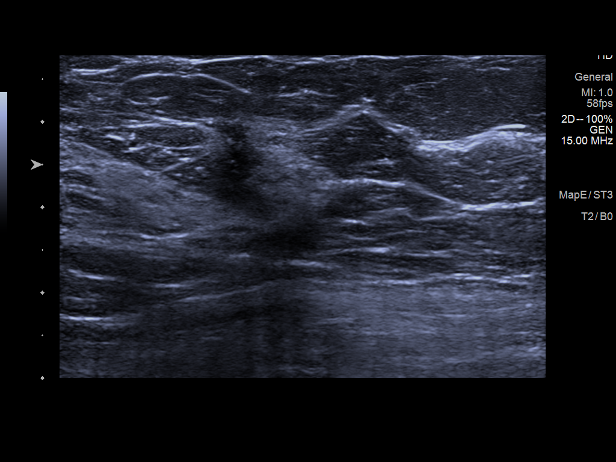
[im 10/58]
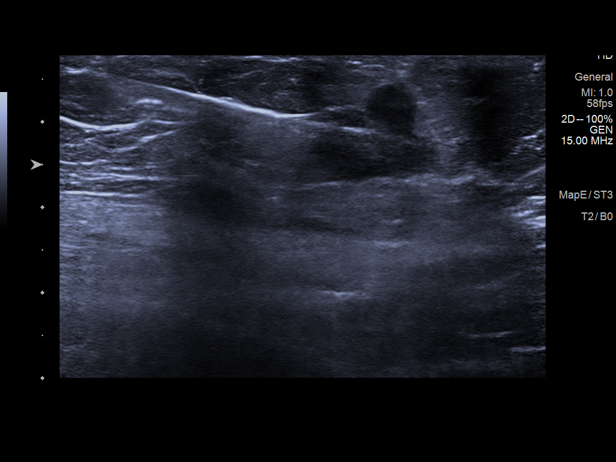
[im 15/58]
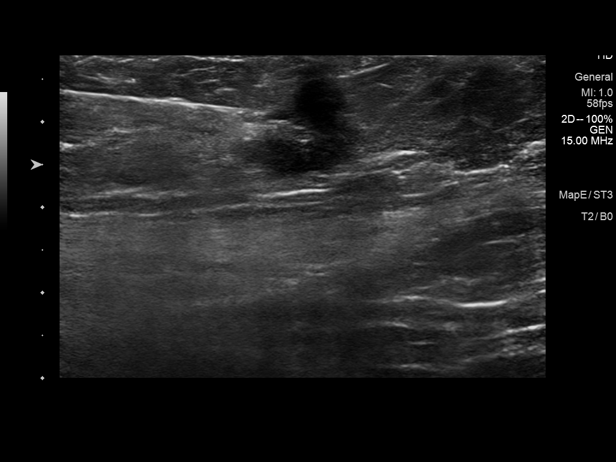
[im 20/58]
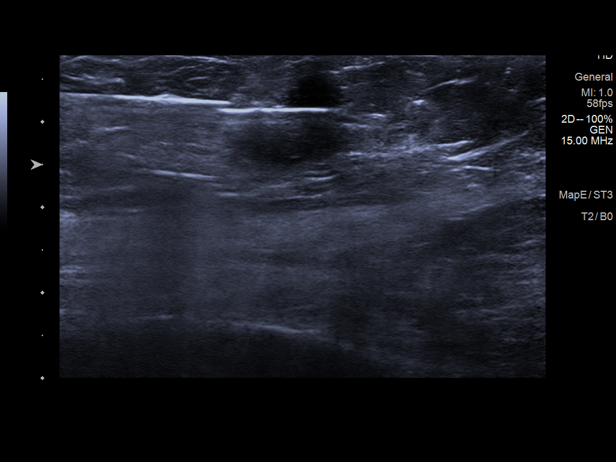
[im 22/58]
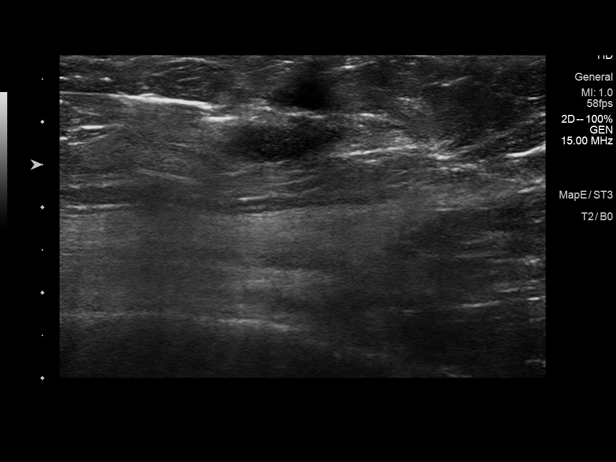
[im 27/58]
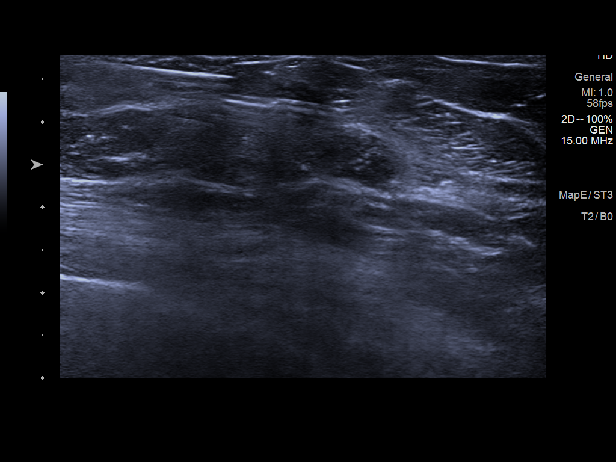
[im 31/58]
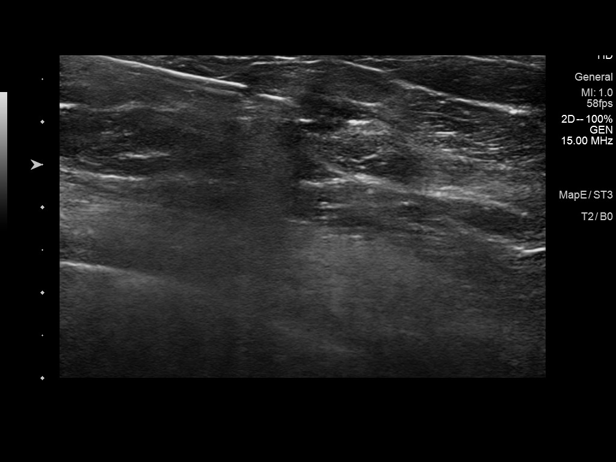
[im 36/58]
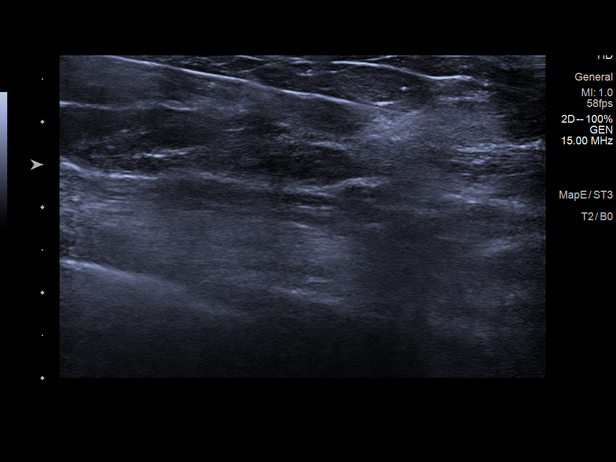
[im 39/58]
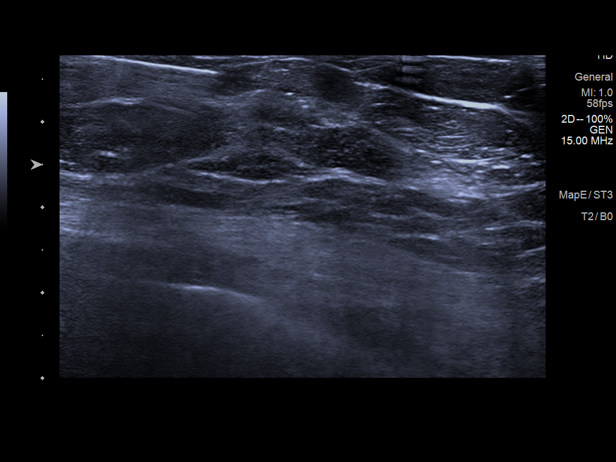
[im 43/58]
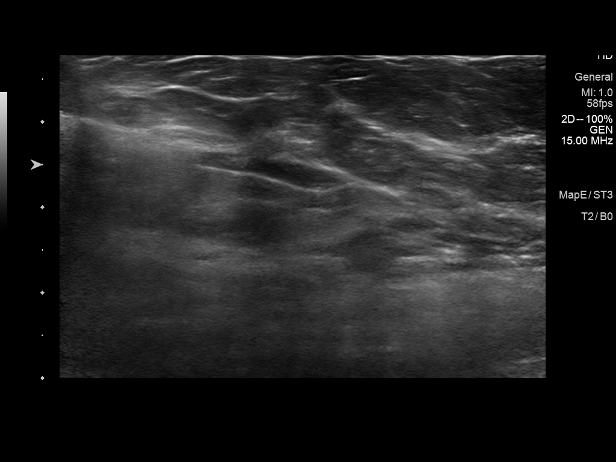
[im 48/58]
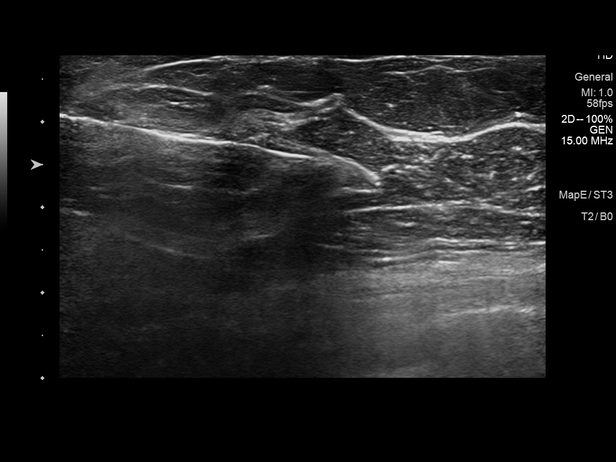
[im 53/58]
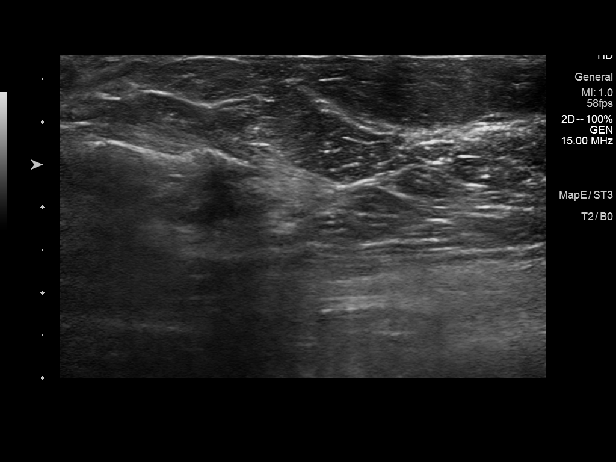
[im 58/58]
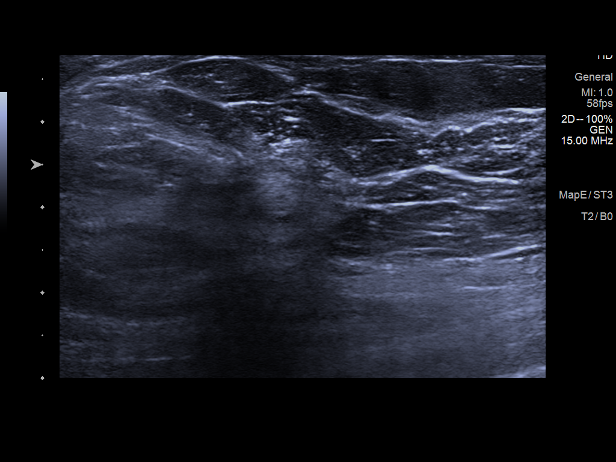

[14 of 25 positions shown; findings below may reference images not displayed]



ULTRASOUND GUIDED LEFT BREAST CORE NEEDLE BIOPSY #1 (1.3 cm 12
o'clock position mass-RIBBON clip):

Using sterile technique and 1% Lidocaine with and without
epinephrine as local anesthetic, under direct ultrasound
visualization, a 12 gauge JOHANNA MAARJA device was used to perform
biopsy of the 1.3 cm mass at the 12 o'clock position of the LEFT
breast using a MEDIAL approach. At the conclusion of the procedure a
RIBBON tissue marker clip was deployed into the biopsy cavity.
Follow up 2 view mammogram was performed and dictated separately.

ULTRASOUND GUIDED LEFT BREAST CORE NEEDLE BIOPSY #2 (1 cm 10 o'clock
position mass-WING clip):

Using sterile technique and 1% Lidocaine with and without
epinephrine as local anesthetic, under direct ultrasound
visualization, a 12 gauge JOHANNA MAARJA device was used to perform
biopsy of the 1 cm mass at the 10 o'clock position of the LEFT
breast using a MEDIAL approach. At the conclusion of the procedure a
WING tissue marker clip was deployed into the biopsy cavity. Follow
up 2 view mammogram was performed and dictated separately.

ULTRASOUND GUIDED RIGHT BREAST CORE NEEDLE BIOPSY (0.7 cm 10 o'clock
position mass-RIBBON clip):

Using sterile technique and 1% Lidocaine with and without
epinephrine as local anesthetic, under direct ultrasound
visualization, a 12 gauge JOHANNA MAARJA device was used to perform
biopsy of the 0.7 cm mass at the 10 o'clock position of the RIGHT
breast using a LATERAL approach. At the conclusion of the procedure
a RIBBON tissue marker clip was deployed into the biopsy cavity.
Follow up 2 view mammogram was performed and dictated separately.
IMPRESSION: Ultrasound guided biopsies of a 1.3 cm UPPER LEFT breast mass
(RIBBON clip), 1 cm UPPER INNER LEFT breast mass (WING clip), and
0.7 cm UPPER-OUTER RIGHT breast mass (RIBBON clip). No apparent
complications.

ADDENDUM:
PATHOLOGY revealed: Site A. BREAST, LEFT, [DATE], BIOPSY: - Invasive
mammary carcinoma, grade 3. - See comment.

Pathology results are CONCORDANT with imaging findings, per Dr. JOHANNA MAARJA
JOHANNA MAARJA.

PATHOLOGY revealed: Site B. BREAST, LEFT, [DATE], BIOPSY: - Invasive
mammary carcinoma, grade 3. - See comment.

Pathology results are CONCORDANT with imaging findings, per Dr. JOHANNA MAARJA
JOHANNA MAARJA.

PATHOLOGY revealed: Site C. BREAST, RIGHT, [DATE], BIOPSY: - Invasive
mammary carcinoma, grade 1. - Mammary carcinoma in situ. COMMENT:
The greatest tumor dimension in the [DATE] left breast biopsy (part
A) is 1.1 cm and in the [DATE] left breast biopsy (part B) is 0.8 cm.
The [DATE] and [DATE] left breast biopsies have identical morphologic
features. The greatest tumor dimension in the right breast [DATE]
biopsy part C) is 0.6 cm.

Pathology results are CONCORDANT with imaging findings, per Dr. JOHANNA MAARJA
JOHANNA MAARJA.

Pathology results and recommendations below were discussed with
patient by telephone on [DATE]. Patient reported biopsy site
within normal limits with slight tenderness at the site. Post biopsy
care instructions were reviewed, questions were answered and my
direct phone number was provided to patient. Patient was instructed
to call [HOSPITAL] [HOSPITAL] Mammography Department if
any concerns or questions arise related to the biopsy.

RECOMMENDATIONS: 1. Surgical consultation. Request for surgical
consultation was relayed to JOHANNA MAARJA RT at [HOSPITAL] JOHANNA MAARJA
[facility] Mammography Department by JOHANNA MAARJA RN on [DATE].

2. Consider bilateral breast MRI to evaluate extent of breast
disease.

Pathology results reported by JOHANNA MAARJA RN on [DATE].



ULTRASOUND GUIDED LEFT BREAST CORE NEEDLE BIOPSY #1 (1.3 cm 12
o'clock position mass-RIBBON clip):

Using sterile technique and 1% Lidocaine with and without
epinephrine as local anesthetic, under direct ultrasound
visualization, a 12 gauge JOHANNA MAARJA device was used to perform
biopsy of the 1.3 cm mass at the 12 o'clock position of the LEFT
breast using a MEDIAL approach. At the conclusion of the procedure a
RIBBON tissue marker clip was deployed into the biopsy cavity.
Follow up 2 view mammogram was performed and dictated separately.

ULTRASOUND GUIDED LEFT BREAST CORE NEEDLE BIOPSY #2 (1 cm 10 o'clock
position mass-WING clip):

Using sterile technique and 1% Lidocaine with and without
epinephrine as local anesthetic, under direct ultrasound
visualization, a 12 gauge JOHANNA MAARJA device was used to perform
biopsy of the 1 cm mass at the 10 o'clock position of the LEFT
breast using a MEDIAL approach. At the conclusion of the procedure a
WING tissue marker clip was deployed into the biopsy cavity. Follow
up 2 view mammogram was performed and dictated separately.

ULTRASOUND GUIDED RIGHT BREAST CORE NEEDLE BIOPSY (0.7 cm 10 o'clock
position mass-RIBBON clip):

Using sterile technique and 1% Lidocaine with and without
epinephrine as local anesthetic, under direct ultrasound
visualization, a 12 gauge JOHANNA MAARJA device was used to perform
biopsy of the 0.7 cm mass at the 10 o'clock position of the RIGHT
breast using a LATERAL approach. At the conclusion of the procedure
a RIBBON tissue marker clip was deployed into the biopsy cavity.
Follow up 2 view mammogram was performed and dictated separately.
IMPRESSION: Ultrasound guided biopsies of a 1.3 cm UPPER LEFT breast mass
(RIBBON clip), 1 cm UPPER INNER LEFT breast mass (WING clip), and
0.7 cm UPPER-OUTER RIGHT breast mass (RIBBON clip). No apparent
complications.

## 2021-09-03 IMAGING — MG MM BREAST LOCALIZATION CLIP
4 series · 4 of 12 positions shown · non-contrast
Comparison: Previous exam(s).

CLINICAL DATA: Evaluate placement of RIBBON and WING biopsy clips
within the LEFT breast and RIBBON biopsy clip within the RIGHT
breast following ultrasound-guided breast biopsies.

EXAM:
3D DIAGNOSTIC BILATERAL MAMMOGRAM POST ULTRASOUND BIOPSY

[R ML synth-2D]
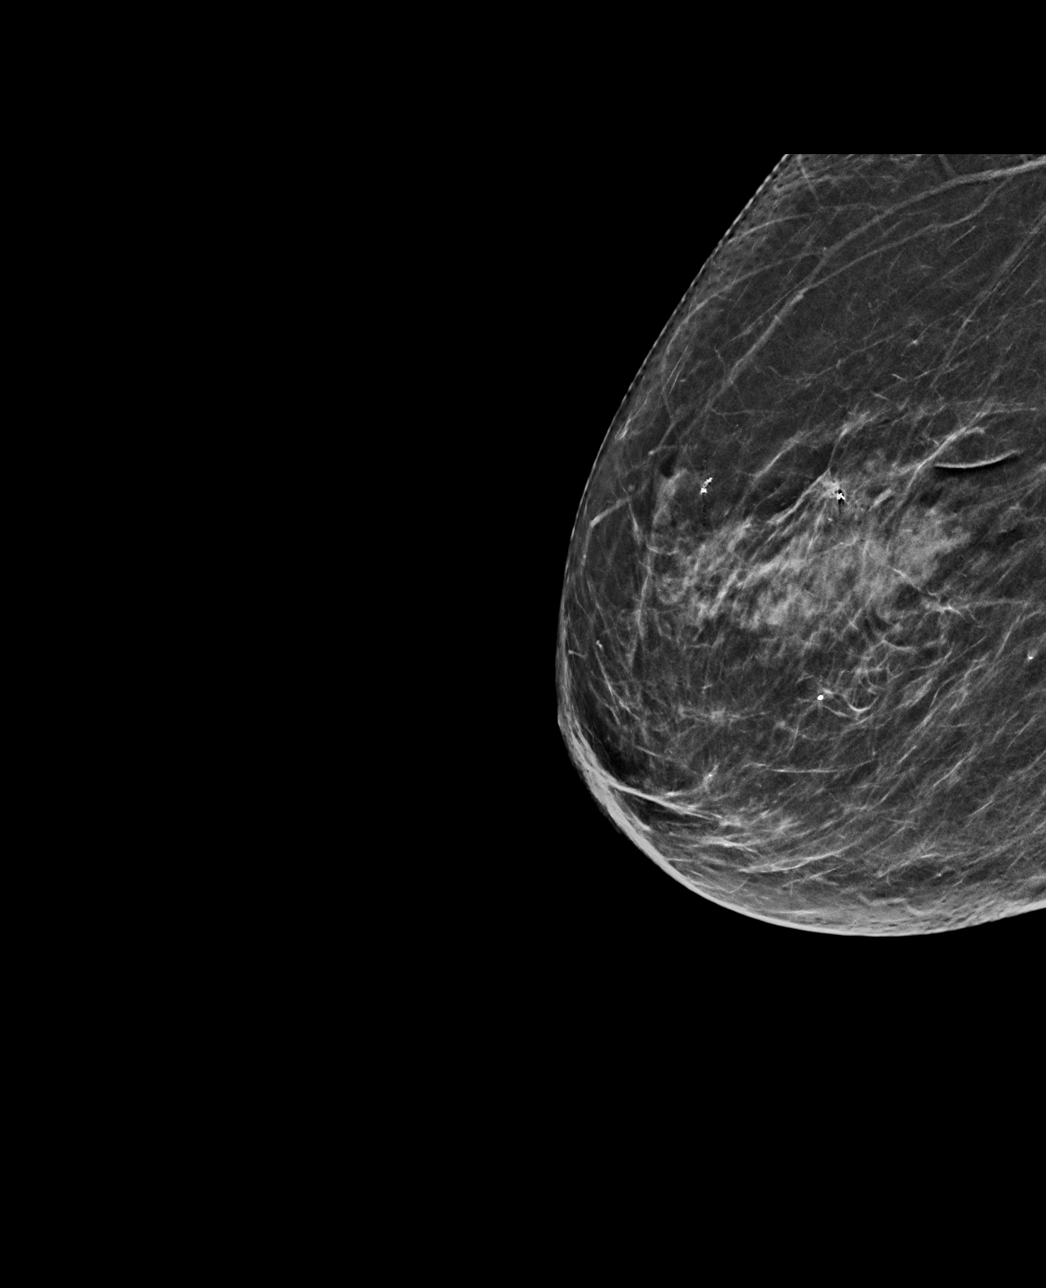

[R CC synth-2D]
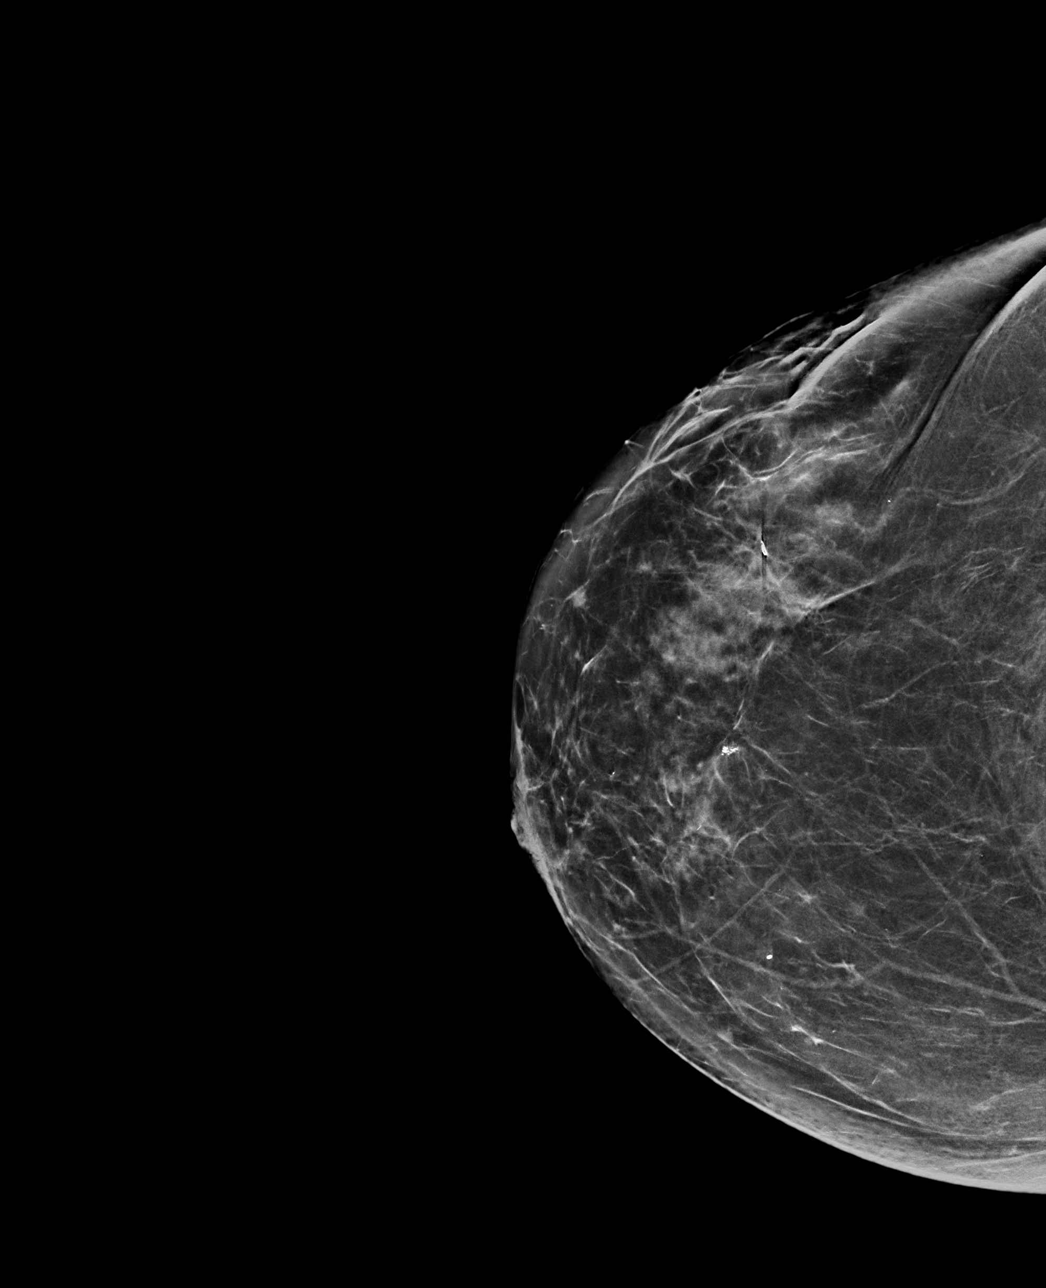

[R CC tomo · tomo slice 35/70.0]
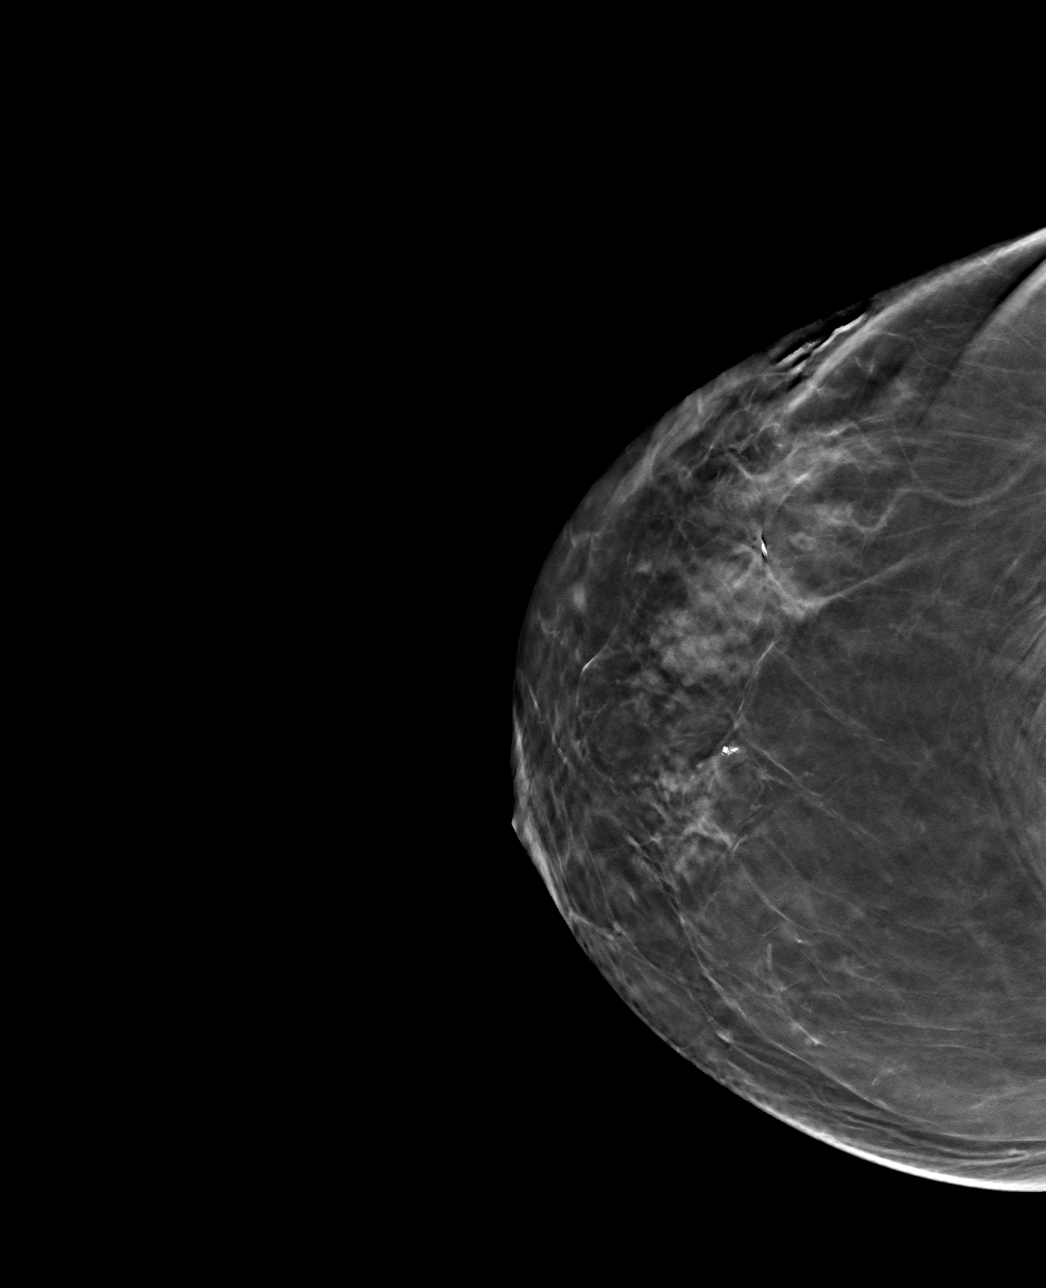

[R ML tomo · tomo slice 33/65.0]
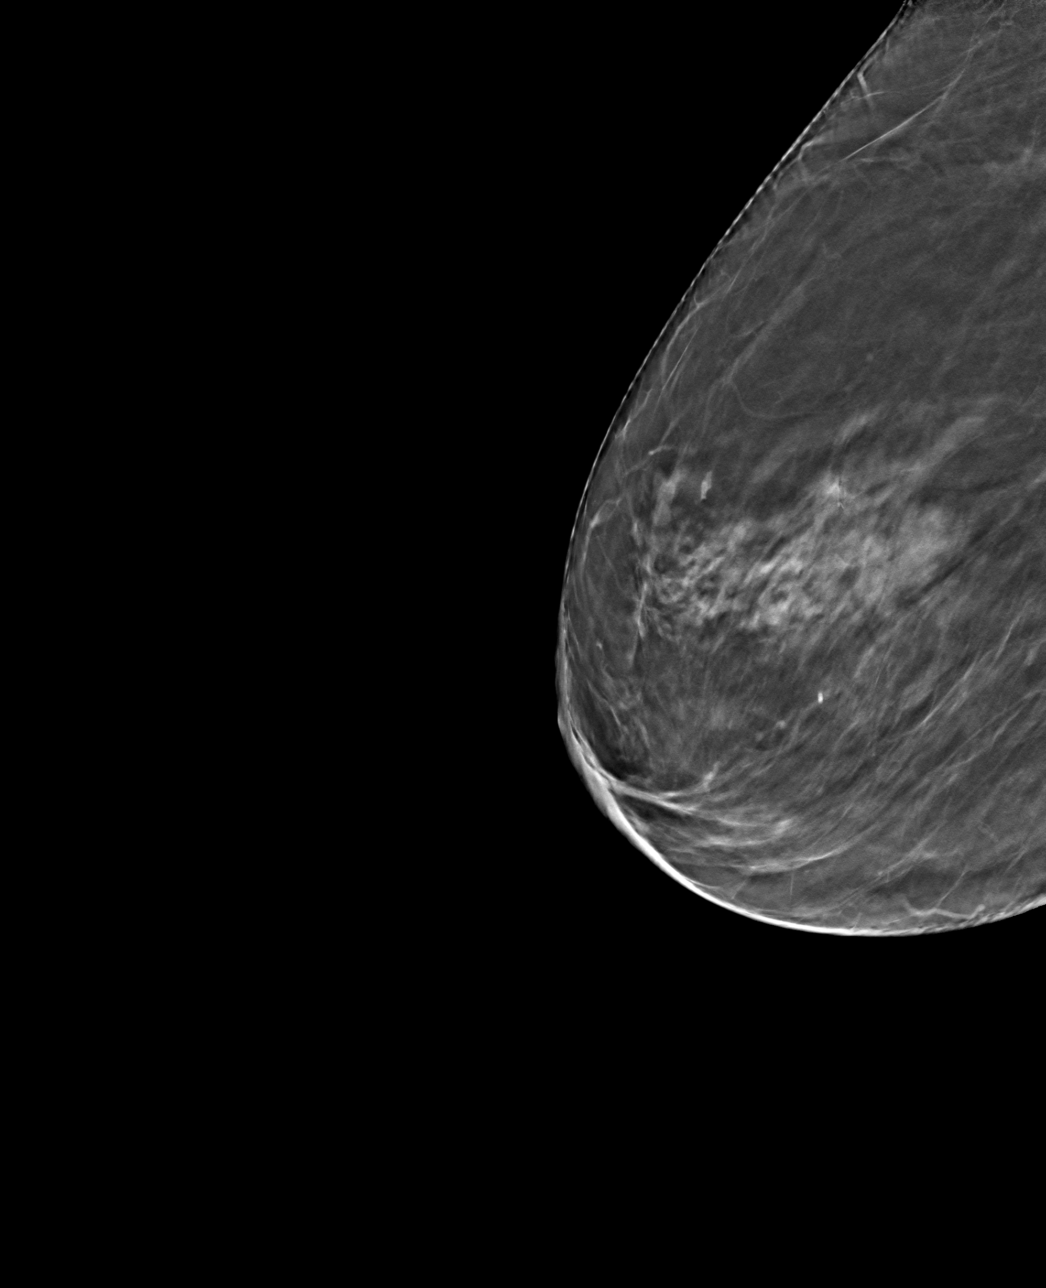

[4 of 12 positions shown; findings below may reference images not displayed]

FINDINGS: 3D Mammographic images were obtained following ultrasound guided
biopsies of a LEFT breast mass at the 12 o'clock position (RIBBON
clip), a LEFT breast mass at the 10 o'clock position (WING clip) and
a RIGHT breast mass at the 10 o'clock position (RIBBON clip).

The RIBBON biopsy marking clip is in expected position at the site
of biopsy at the 12 o'clock position of the LEFT breast.

The WING biopsy marking clip is in expected position at the site of
biopsy at the 10 o'clock position of the LEFT breast.

The RIBBON biopsy marking clip is in expected position at the site
of biopsy at the 10 o'clock position of the RIGHT breast.
IMPRESSION: Appropriate positioning of the RIBBON shaped biopsy marking clip at
the site of biopsy in the UPPER LEFT breast.

Appropriate positioning of the WING shaped biopsy marking clip at
the site of biopsy in the UPPER INNER LEFT breast.

Appropriate positioning of the RIBBON shaped biopsy marking clip at
the site of biopsy in the UPPER OUTER RIGHT breast.

Final Assessment: Post Procedure Mammograms for Marker Placement

## 2021-09-03 IMAGING — MG MM BREAST LOCALIZATION CLIP
4 series · 4 of 12 positions shown · non-contrast
Comparison: Previous exam(s).

CLINICAL DATA: Evaluate placement of RIBBON and WING biopsy clips
within the LEFT breast and RIBBON biopsy clip within the RIGHT
breast following ultrasound-guided breast biopsies.

EXAM:
3D DIAGNOSTIC BILATERAL MAMMOGRAM POST ULTRASOUND BIOPSY

[L ML synth-2D]
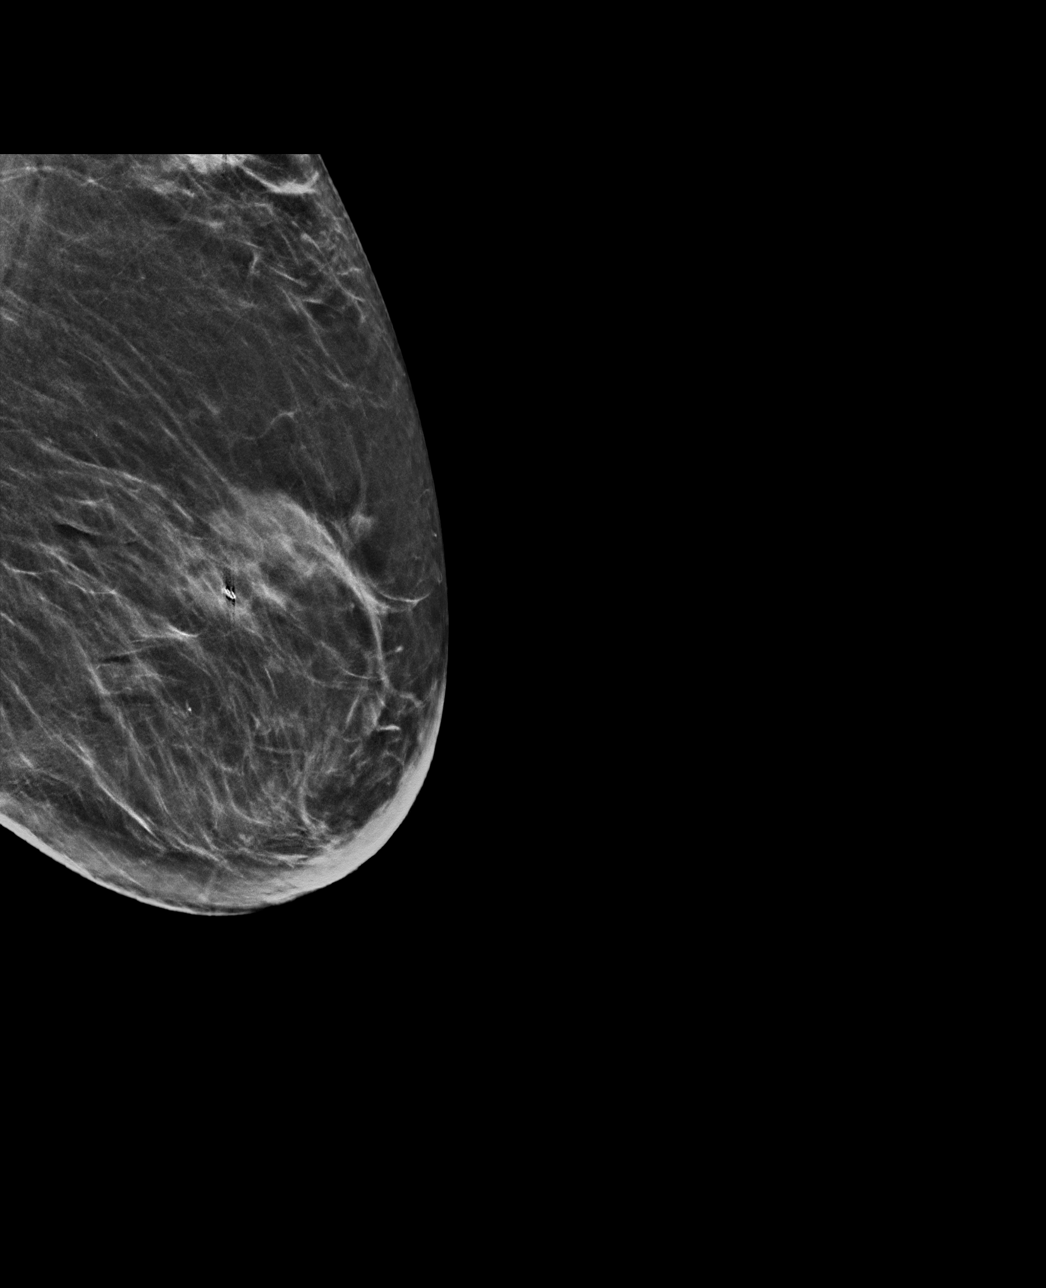

[L CC synth-2D]
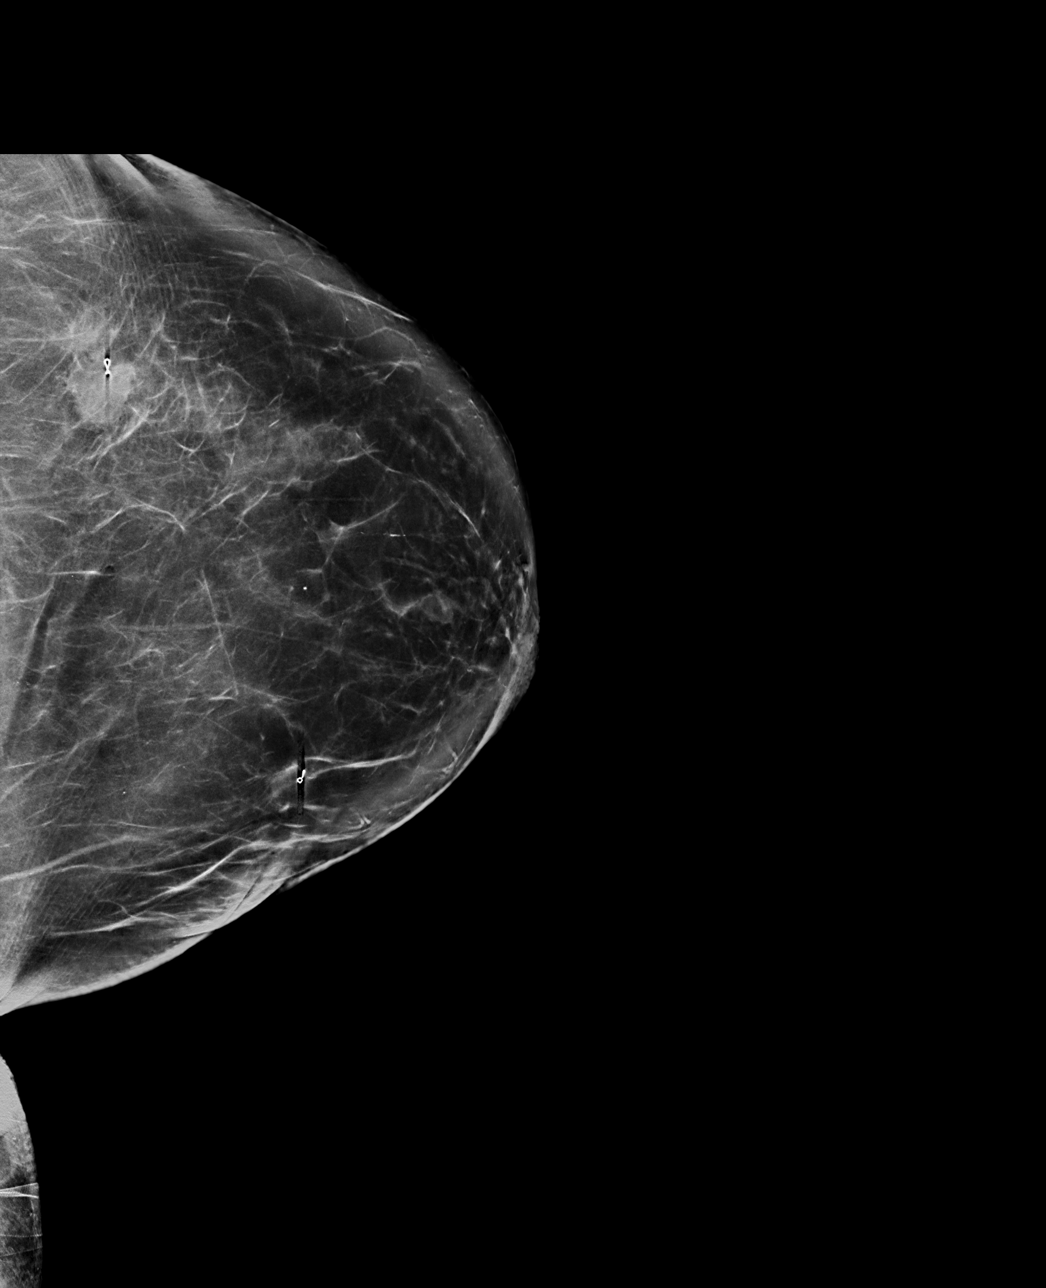

[L CC tomo · tomo slice 41/81.0]
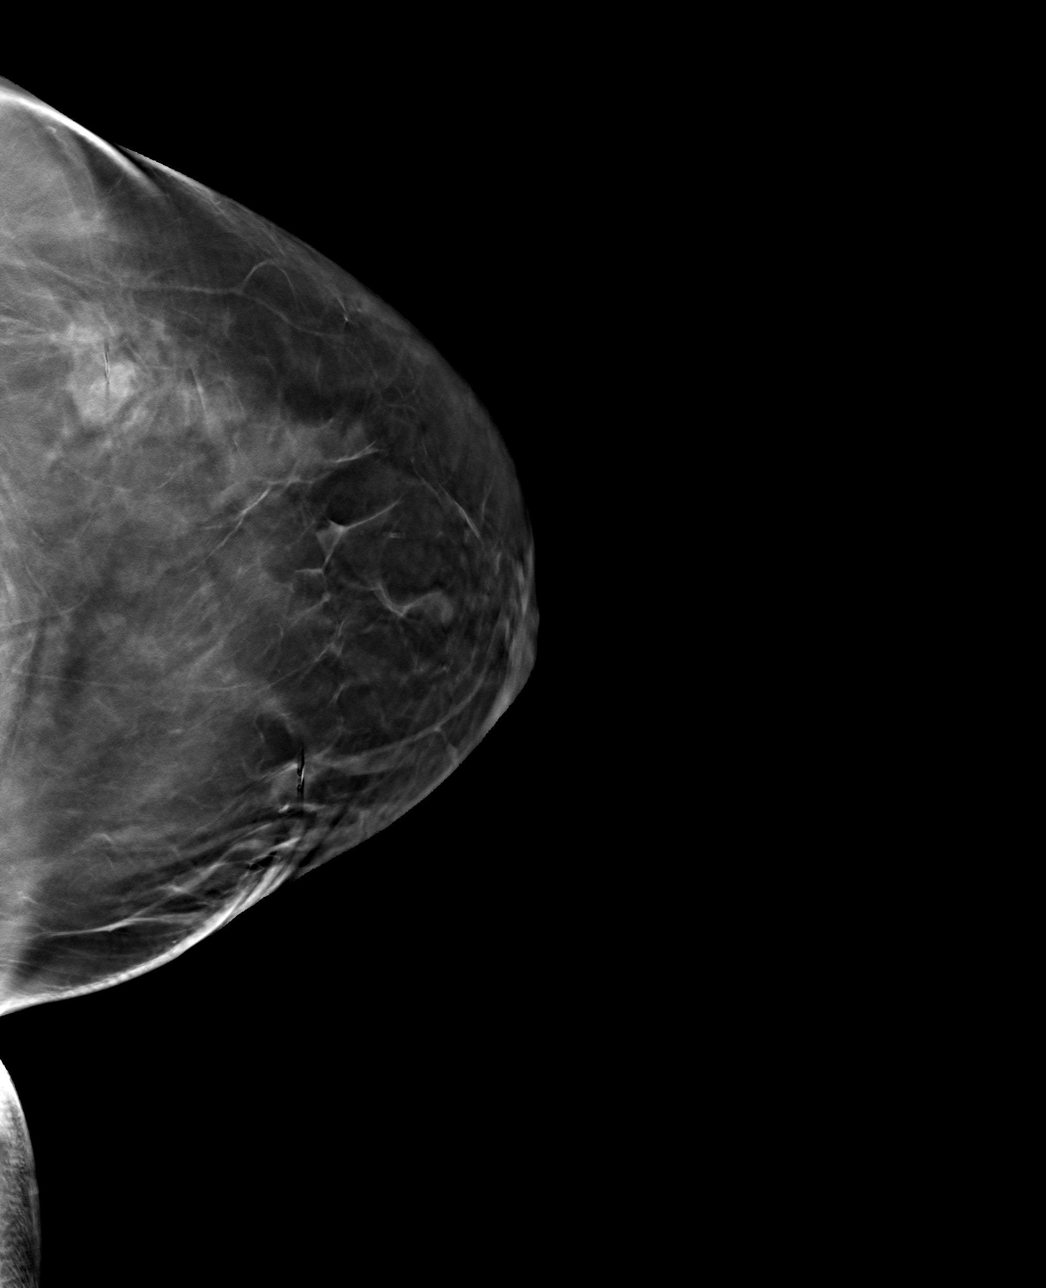

[L ML tomo · tomo slice 36/71.0]
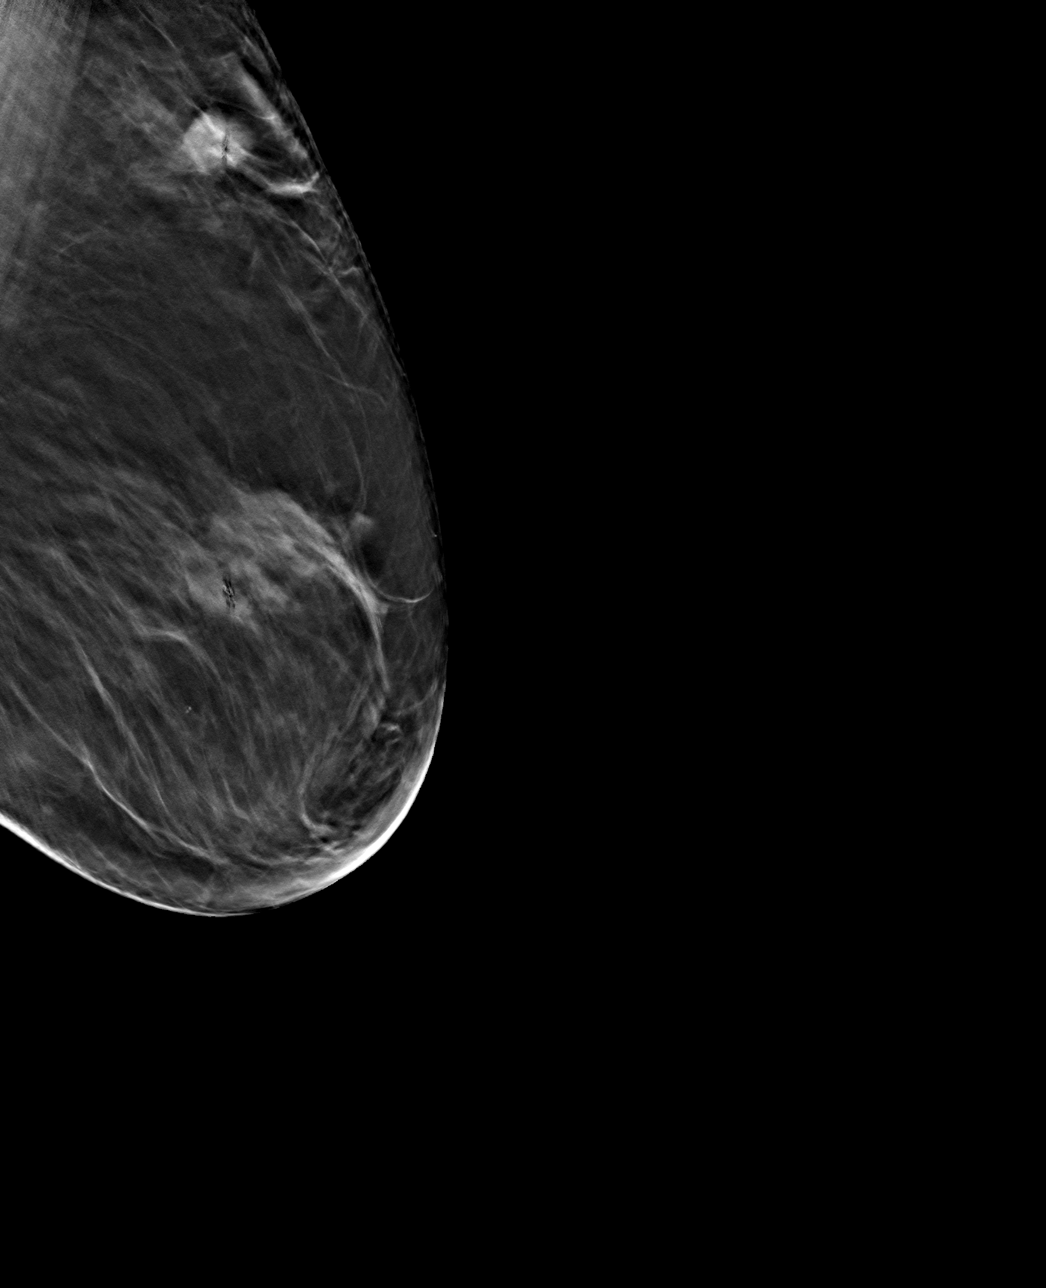

[4 of 12 positions shown; findings below may reference images not displayed]

FINDINGS: 3D Mammographic images were obtained following ultrasound guided
biopsies of a LEFT breast mass at the 12 o'clock position (RIBBON
clip), a LEFT breast mass at the 10 o'clock position (WING clip) and
a RIGHT breast mass at the 10 o'clock position (RIBBON clip).

The RIBBON biopsy marking clip is in expected position at the site
of biopsy at the 12 o'clock position of the LEFT breast.

The WING biopsy marking clip is in expected position at the site of
biopsy at the 10 o'clock position of the LEFT breast.

The RIBBON biopsy marking clip is in expected position at the site
of biopsy at the 10 o'clock position of the RIGHT breast.
IMPRESSION: Appropriate positioning of the RIBBON shaped biopsy marking clip at
the site of biopsy in the UPPER LEFT breast.

Appropriate positioning of the WING shaped biopsy marking clip at
the site of biopsy in the UPPER INNER LEFT breast.

Appropriate positioning of the RIBBON shaped biopsy marking clip at
the site of biopsy in the UPPER OUTER RIGHT breast.

Final Assessment: Post Procedure Mammograms for Marker Placement

## 2021-09-03 NOTE — Progress Notes (Signed)
Pt tolerated biopsies x2 to left breast and biopsy x1 to right breast well. VSS. No acute distress noted. Pt verbalized understanding of discharge instructions and transported to mammogram department by wheelchair.

## 2021-09-07 DIAGNOSIS — M316 Other giant cell arteritis: Secondary | ICD-10-CM | POA: Diagnosis not present

## 2021-09-07 DIAGNOSIS — Z79899 Other long term (current) drug therapy: Secondary | ICD-10-CM | POA: Diagnosis not present

## 2021-09-07 DIAGNOSIS — E559 Vitamin D deficiency, unspecified: Secondary | ICD-10-CM | POA: Diagnosis not present

## 2021-09-07 DIAGNOSIS — R202 Paresthesia of skin: Secondary | ICD-10-CM | POA: Diagnosis not present

## 2021-09-14 ENCOUNTER — Ambulatory Visit (HOSPITAL_COMMUNITY)
Admission: RE | Admit: 2021-09-14 | Discharge: 2021-09-14 | Disposition: A | Discharge status: Home / Self Care | Payer: Medicare HMO | Source: Ambulatory Visit | Attending: Orthopaedic Surgery | Admitting: Orthopaedic Surgery

## 2021-09-14 ENCOUNTER — Other Ambulatory Visit: Payer: Self-pay

## 2021-09-14 DIAGNOSIS — M5126 Other intervertebral disc displacement, lumbar region: Secondary | ICD-10-CM | POA: Diagnosis not present

## 2021-09-14 DIAGNOSIS — G8929 Other chronic pain: Secondary | ICD-10-CM | POA: Diagnosis not present

## 2021-09-14 DIAGNOSIS — S32691A Other specified fracture of right ischium, initial encounter for closed fracture: Secondary | ICD-10-CM | POA: Insufficient documentation

## 2021-09-14 DIAGNOSIS — M545 Low back pain, unspecified: Secondary | ICD-10-CM | POA: Diagnosis not present

## 2021-09-14 IMAGING — MR MR LUMBAR SPINE W/O CM
5 series · 41 of 48 positions shown · non-contrast
Comparison: Radiography [DATE]

CLINICAL DATA: Low back pain, greater than 6 weeks duration.
Worsening groin pain. Pelvic insufficiency fracture. Low back pain
radiates to the legs weakness and numbness.

EXAM:
MRI LUMBAR SPINE WITHOUT CONTRAST
TECHNIQUE: Multiplanar, multisequence MR imaging of the lumbar spine was
performed. No intravenous contrast was administered.

[Series 6: T1 · sagittal · 4.0mm · 1.02mm/px · 6 of 15 slices shown (1 of 2)]
[im 1/15]
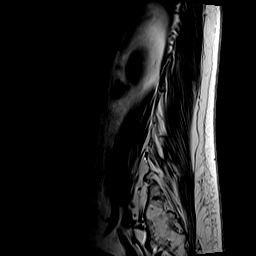
[im 3/15]
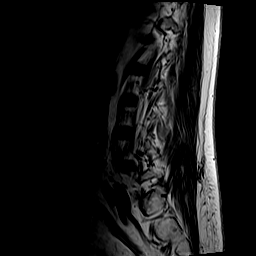
[im 6/15]
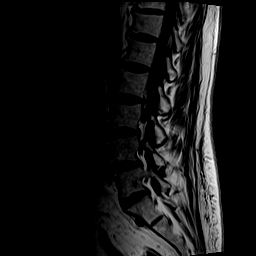
[im 9/15]
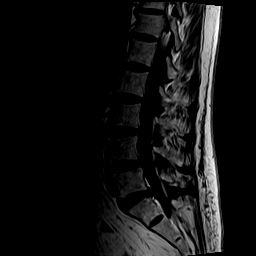
[im 12/15]
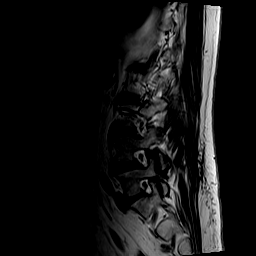
[im 15/15]
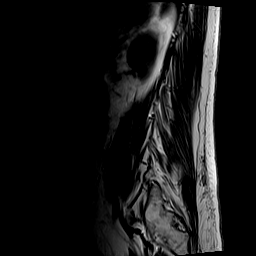

[Series 7: STIR · sagittal · 4.0mm · 0.51mm/px · 7 of 15 slices shown]
[im 1/15]
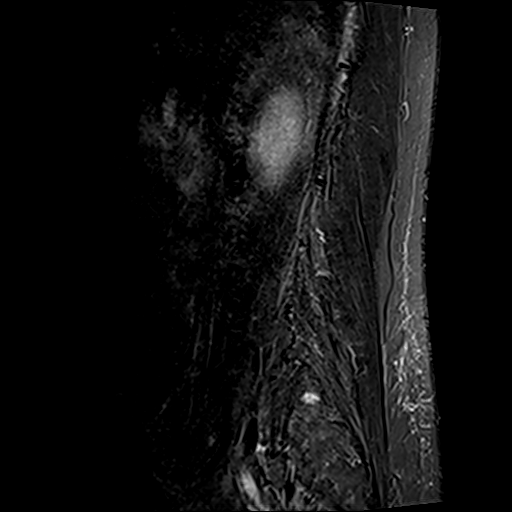
[im 3/15]
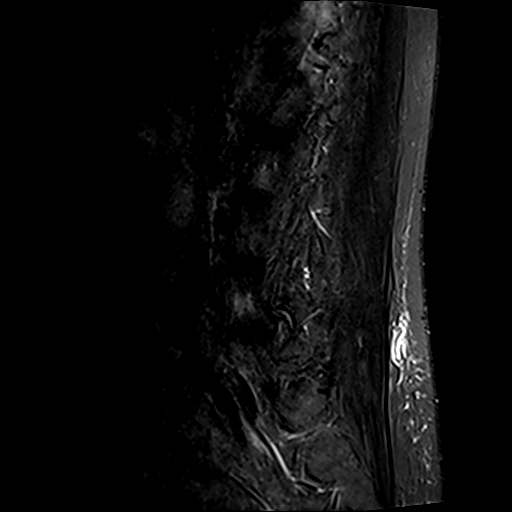
[im 5/15]
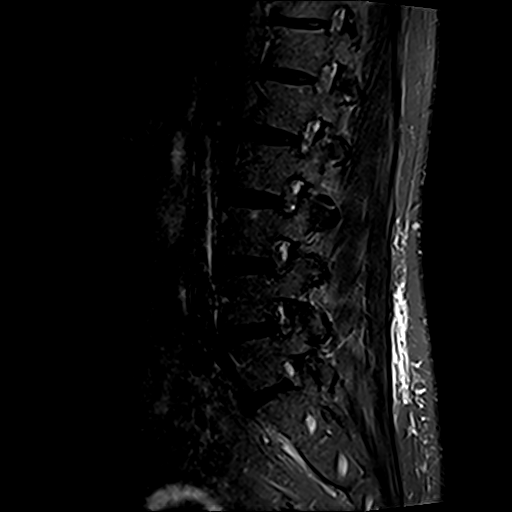
[im 8/15]
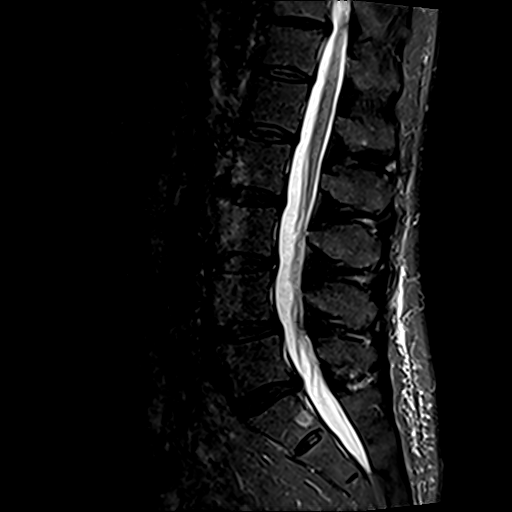
[im 10/15]
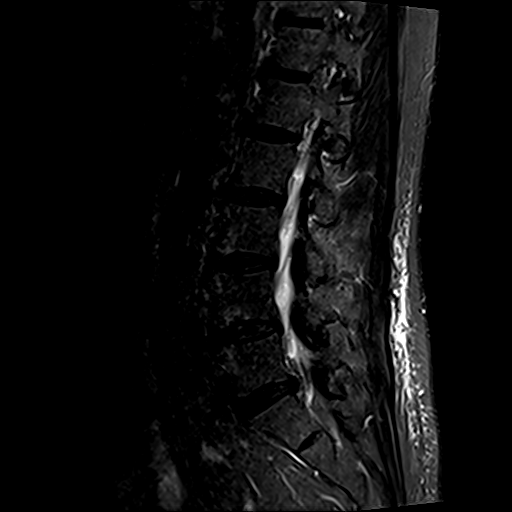
[im 12/15]
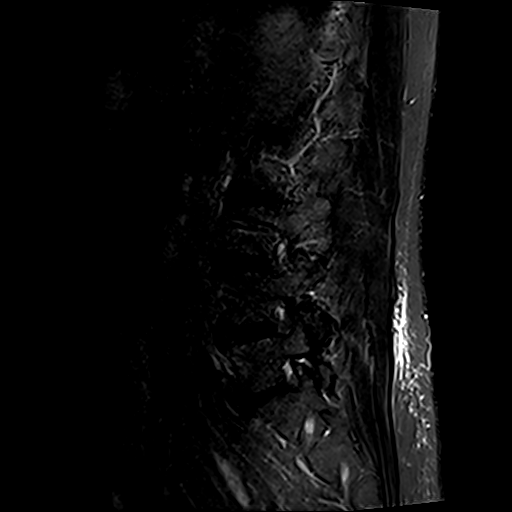
[im 15/15]
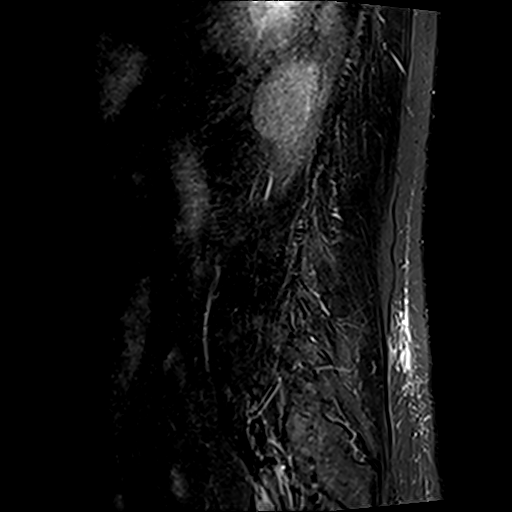

[Series 8: T2 · axial · 4.0mm · 0.70mm/px · z∈[-104,+90]mm · 13 of 32 slices shown (1 of 2)]
[im 1/32]
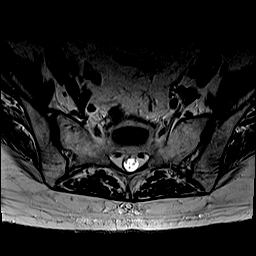
[im 3/32]
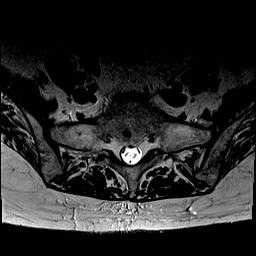
[im 5/32]
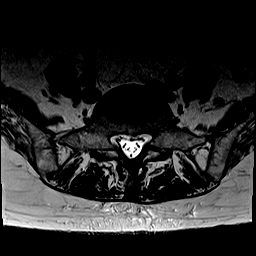
[im 8/32]
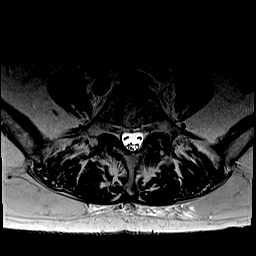
[im 10/32]
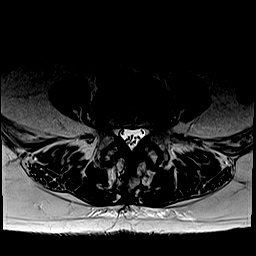
[im 12/32]
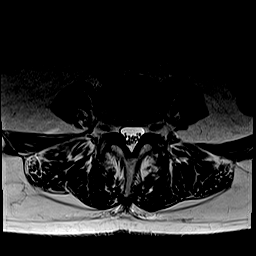
[im 15/32]
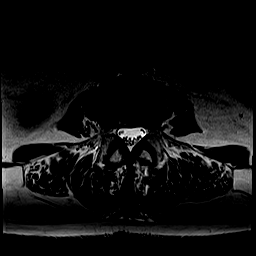
[im 17/32]
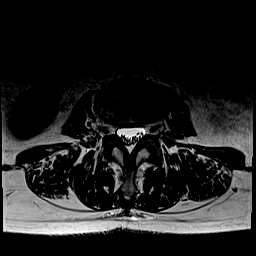
[im 20/32]
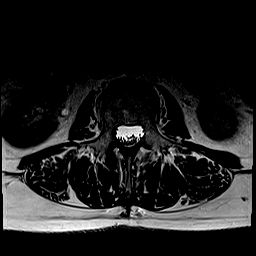
[im 22/32]
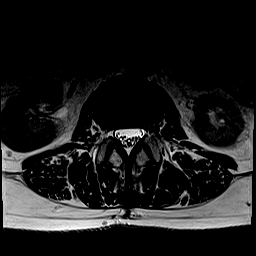
[im 24/32]
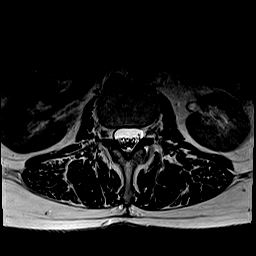
[im 27/32]
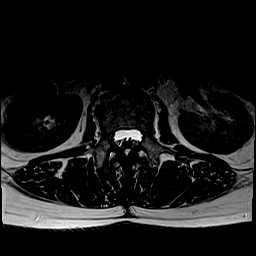
[im 32/32]
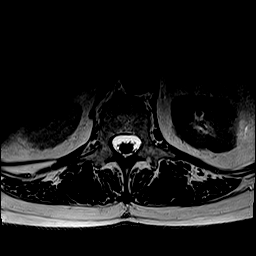

[Series 9: T1 · axial · 4.0mm · 0.70mm/px · z∈[-104,+90]mm · 8 of 32 slices shown (2 of 2)]
[im 1/32]
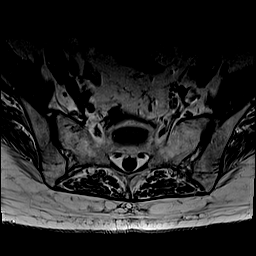
[im 5/32]
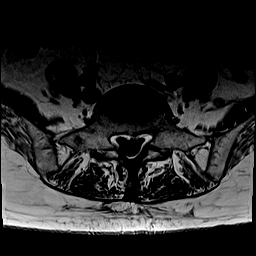
[im 10/32]
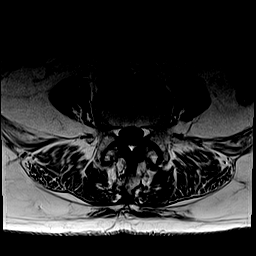
[im 15/32]
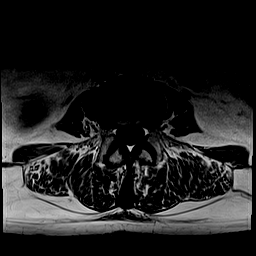
[im 17/32]
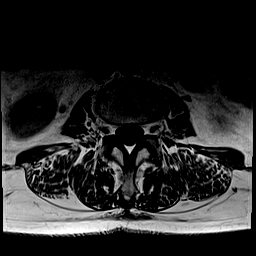
[im 22/32]
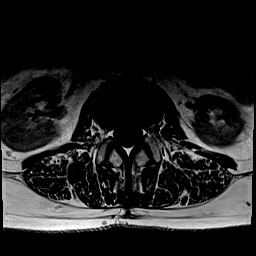
[im 27/32]
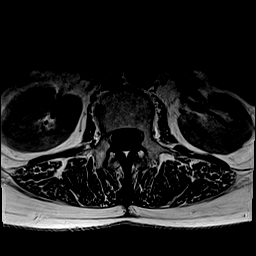
[im 32/32]
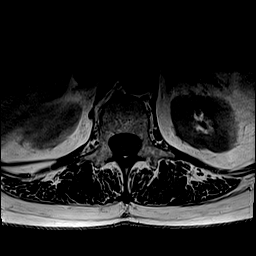

[Series 10: T2 · sagittal · 4.0mm · 0.81mm/px · 7 of 15 slices shown (2 of 2)]
[im 1/15]
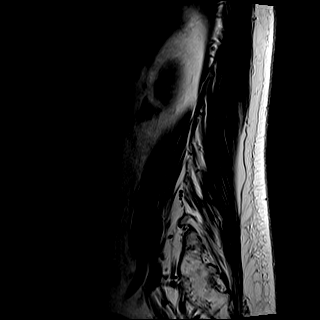
[im 3/15]
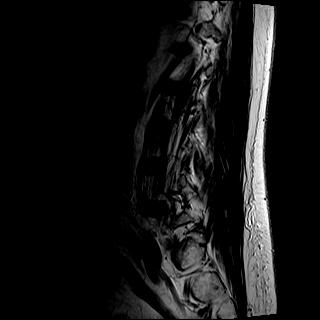
[im 5/15]
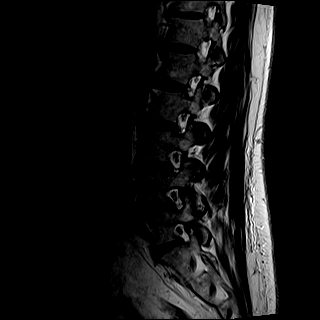
[im 8/15]
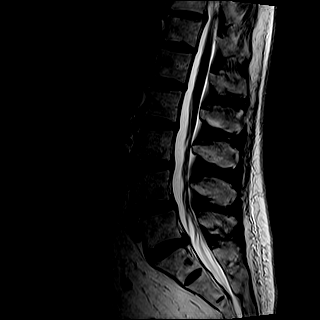
[im 10/15]
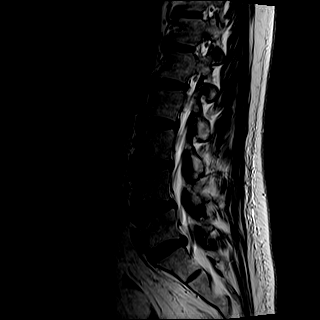
[im 12/15]
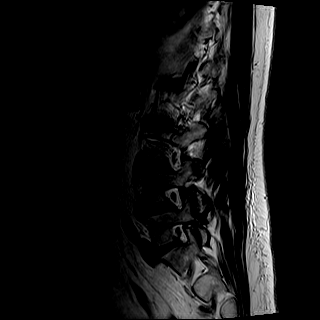
[im 15/15]
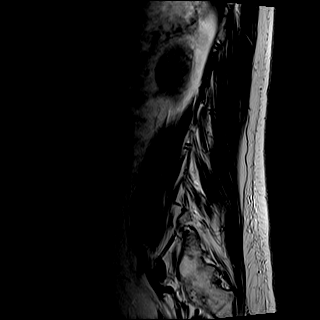

[41 of 48 positions shown; findings below may reference images not displayed]

FINDINGS: Segmentation:  5 lumbar type vertebral bodies.

Alignment:  2 mm degenerative anterolisthesis L4-5.

Vertebrae: No fracture. 1 cm foci of low T1 signal and high T2
signal within the L5-S1 vertebrae. These are nonspecific and could
be benign marrow foci, at metastatic disease is not excluded. Does
the patient have a systemic malignancy? If not, consider follow-up
study in 3 months see if these change. There is no sign of sacral
insufficiency fracture. The study covers as low as S3.

Conus medullaris and cauda equina: Conus extends to the L2-3 level.
Conus and cauda equina appear normal.

Paraspinal and other soft tissues: Negative

Disc levels:

T11-12: Shallow central disc protrusion indents the ventral
subarachnoid space but does not compress the cord or show foraminal
extension.

T12-L1 through L3-4: Mild noncompressive disc bulges.

L4-5: Bilateral facet degeneration and hypertrophy with 2 mm of
anterolisthesis. Mild bulging of the disc. No compressive stenosis.
Findings at this level could relate to low back

L5-S1: Shallow central disc protrusion contacts the thecal sac but
does not compress the thecal sac or S1 nerves. Minimal facet and
ligamentous hypertrophy without edema.
IMPRESSION: No evidence of sacral insufficiency fracture. 1 cm foci of low T1
and high T2 signal within the L5 and S1 vertebral bodies. These are
nonspecific and could be benign marrow foci or could be marrow space
metastatic disease. Does the patient have a systemic malignancy?
Not, consider follow-up study in months to assess for change.

T11-12: Shallow central disc herniation indents the thecal sac but
does not compress the cord or show foraminal extension.

L4-5: Bilateral facet degeneration hypertrophy with 2 mm of
anterolisthesis. Mild bulging disc. No compressive stenosis.
Findings at this level could relate back pain.

L5-S1: Shallow central disc protrusion contacts the thecal sac in
the S1 root sleeves but does not appear to cause neural compression.
Mild facet degeneration this level

Minimal, non-compressive disc bulges from T12-L1 through L3-4.

## 2021-09-17 ENCOUNTER — Telehealth: Payer: Self-pay | Admitting: Orthopaedic Surgery

## 2021-09-17 NOTE — Telephone Encounter (Signed)
Pt calling to get an appt set up for post mri with [facility] office. The best call back number is [phone].

## 2021-09-21 ENCOUNTER — Encounter: Payer: Self-pay | Admitting: Family Medicine

## 2021-09-21 ENCOUNTER — Ambulatory Visit (INDEPENDENT_AMBULATORY_CARE_PROVIDER_SITE_OTHER): Payer: Medicare HMO | Admitting: Family Medicine

## 2021-09-21 DIAGNOSIS — J40 Bronchitis, not specified as acute or chronic: Secondary | ICD-10-CM | POA: Diagnosis not present

## 2021-09-21 DIAGNOSIS — J329 Chronic sinusitis, unspecified: Secondary | ICD-10-CM | POA: Diagnosis not present

## 2021-09-21 MED ORDER — AMOXICILLIN-POT CLAVULANATE 875-125 MG PO TABS: 1.0000 | ORAL_TABLET | Freq: Two times a day (BID) | ORAL | 0 refills | Status: DC

## 2021-09-21 MED ORDER — CHERATUSSIN AC 100-10 MG/5ML PO SOLN: 5.0000 mL | ORAL | 0 refills | Status: DC | PRN

## 2021-09-21 NOTE — Progress Notes (Signed)
Subjective:    Patient ID: Elizabeth Wu, female    DOB: 01/04/1951, 70 y.o.   MRN: [id number]   HPI: Manuela Halbur is a 70 y.o. female presenting for cough and headache, rhino and congestion. Coughs so hard she gags. No vomiting. Green sputum. Onset 2 days ago with diarrhea. Then it wet away the next day and vomiting and cough started. No fever. No dyspnea. Has had 3 Covid shots. Currently having work up for Breast cancer.    Depression screen [facility] 2/9 08/06/2021 07/21/2021 06/30/2021 03/01/2021 02/02/2021  Decreased Interest 0 0 0 0 0  Down, Depressed, Hopeless 0 0 0 0 0  PHQ - 2 Score 0 0 0 0 0  Altered sleeping - - - 0 0  Tired, decreased energy - - - 0 0  Change in appetite - - - 0 0  Feeling bad or failure about yourself  - - - 0 0  Trouble concentrating - - - 0 0  Moving slowly or fidgety/restless - - - 0 0  Suicidal thoughts - - - 0 0  PHQ-9 Score - - - 0 0  Difficult doing work/chores - - - - -  Some recent data might be hidden     Relevant past medical, surgical, family and social history reviewed and updated as indicated.  Interim medical history since our last visit reviewed. Allergies and medications reviewed and updated.  ROS:  Review of Systems   Social History   Tobacco Use  Smoking Status Every Day   Packs/day: 0.50   Years: 40.00   Pack years: 20.00   Types: Cigarettes  Smokeless Tobacco Never       Objective:     Wt Readings from Last 3 Encounters:  08/26/21 153 lb (69.4 kg)  08/06/21 155 lb (70.3 kg)  07/21/21 151 lb 3.2 oz (68.6 kg)     Exam deferred. Pt. Harboring due to COVID 19. Phone visit performed.   Assessment & Plan:   1. Sinobronchitis     Meds ordered this encounter  Medications   amoxicillin-clavulanate (AUGMENTIN) 875-125 MG tablet    Sig: Take 1 tablet by mouth 2 (two) times daily. Take all of this medication    Dispense:  20 tablet    Refill:  0   guaiFENesin-codeine (CHERATUSSIN AC) 100-10 MG/5ML syrup    Sig: Take 5  mLs by mouth every 4 (four) hours as needed for cough.    Dispense:  180 mL    Refill:  0    No orders of the defined types were placed in this encounter.     Diagnoses and all orders for this visit:  Sinobronchitis  Other orders -     amoxicillin-clavulanate (AUGMENTIN) 875-125 MG tablet; Take 1 tablet by mouth 2 (two) times daily. Take all of this medication -     guaiFENesin-codeine (CHERATUSSIN AC) 100-10 MG/5ML syrup; Take 5 mLs by mouth every 4 (four) hours as needed for cough.   Virtual Visit via telephone Note  I discussed the limitations, risks, security and privacy concerns of performing an evaluation and management service by telephone and the availability of in person appointments. The patient was identified with two identifiers. Pt.expressed understanding and agreed to proceed. Pt. Is at home. Dr. Livia Snellen is in his office.  Follow Up Instructions:   I discussed the assessment and treatment plan with the patient. The patient was provided an opportunity to ask questions and all were answered. The patient agreed with the plan  and demonstrated an understanding of the instructions.   The patient was advised to call back or seek an in-person evaluation if the symptoms worsen or if the condition fails to improve as anticipated.   Total minutes including chart review and phone contact time: 5   Follow up plan: Return if symptoms worsen or fail to improve.  Claretta Fraise, MD [place]

## 2021-09-23 ENCOUNTER — Telehealth: Payer: Self-pay | Admitting: Radiology

## 2021-09-23 NOTE — Telephone Encounter (Signed)
noted 

## 2021-09-23 NOTE — Telephone Encounter (Signed)
Patient requests call with MRI results. She has multiple appointments coming up with oncology and does not know when she may be able to get here for MRI review.  CB [phone]

## 2021-09-28 ENCOUNTER — Telehealth: Payer: Self-pay | Admitting: Family Medicine

## 2021-09-28 NOTE — Telephone Encounter (Signed)
Patient aware that we would need an order from Dr. Merlene Laughter and we will be able to complete B12 at our office.

## 2021-09-30 ENCOUNTER — Ambulatory Visit: Payer: Medicare HMO | Admitting: General Surgery

## 2021-09-30 ENCOUNTER — Encounter: Payer: Self-pay | Admitting: General Surgery

## 2021-09-30 ENCOUNTER — Other Ambulatory Visit: Payer: Self-pay

## 2021-09-30 VITALS — BP 135/88 | HR 96 | Temp 98.4°F | Resp 14 | Ht 64.0 in | Wt 142.0 lb

## 2021-09-30 DIAGNOSIS — C50212 Malignant neoplasm of upper-inner quadrant of left female breast: Secondary | ICD-10-CM

## 2021-09-30 DIAGNOSIS — C50412 Malignant neoplasm of upper-outer quadrant of left female breast: Secondary | ICD-10-CM

## 2021-09-30 DIAGNOSIS — C50411 Malignant neoplasm of upper-outer quadrant of right female breast: Secondary | ICD-10-CM

## 2021-09-30 NOTE — Progress Notes (Signed)
[facility] History and Physical   Reason for Referral: Right and left breast cancer  Referring Physician:  Gottschalk, Ashly M, DO     Chief Complaint   New Patient (Initial Visit)        Elizabeth Wu is a 70 y.o. female.  HPI:  The patient had her normal screening mammogram in October and had areas of concern of the left and right breast.  She has never had any abnormal mammograms in the past. She does have some areas that she can feel.    The patient has no history of any masses, lumps, bumps, nipple changes or discharge. She is unsure when she started her period or had menopause. She has never been pregnant. She has no history of any breast cancer. She is here today with a niece.  She has never had any previous biopsies or concerning areas on mammogram.  She has not had any chest radiation.   She says she has seen a neurologist recently about a sharp pain on her left scalp and is getting B12 injections, starting soon for a low B12 she reports. Her last Vit B12 level I see is 284 (which is within the normal limits of the range but in the lower end).    She denies any cardiac history. She did have a MRI of her back recently with a read that is concerned about systemic cancer due to the marrow appearance. She has no other bone pain and no axillary adenopathy on her US with her mammograms.          Past Medical History:  Diagnosis Date   Allergy     DJD (degenerative joint disease)     Hyperlipidemia     Osteopenia             Past Surgical History:  Procedure Laterality Date   APPENDECTOMY       HERNIA REPAIR       TONSILLECTOMY       TONSILLECTOMY               Family History  Problem Relation Age of Onset   Alzheimer's disease Mother     Cancer Father          stomach cancer   Diabetes Sister     Heart disease Brother          MI      Social History         Tobacco Use   Smoking status: Every Day      Packs/day: 0.50      Years: 40.00       Pack years: 20.00      Types: Cigarettes   Smokeless tobacco: Never  Vaping Use   Vaping Use: Never used  Substance Use Topics   Alcohol use: No   Drug use: No      Medications: I have reviewed the patient's current medications. Allergies as of 09/30/2021   No Known Allergies         Medication List           Accurate as of September 30, 2021  2:12 PM. If you have any questions, ask your nurse or doctor.              acetaminophen-codeine 300-30 MG tablet Commonly known as: TYLENOL #3 Take 1 tablet by mouth every 8 (eight) hours as needed for moderate pain.    amoxicillin-clavulanate 875-125 MG tablet Commonly known as: AUGMENTIN Take 1 tablet   tablet (600 mg total) by mouth 2 (two) times daily as needed for moderate pain.   linaclotide 290 MCG Caps capsule Commonly known as: LINZESS Take 290 mcg by mouth daily before breakfast.   losartan 50 MG tablet Commonly known as: COZAAR Take 1 tablet (50 mg total) by mouth daily.   pantoprazole 20 MG tablet Commonly known as: PROTONIX Take 1 tablet (20 mg total) by mouth daily.   simvastatin 20 MG tablet Commonly known as: ZOCOR Take 1 tablet (20 mg total) by mouth at bedtime.   sodium chloride 0.65 % Soln nasal spray Commonly known as: OCEAN Place 1 spray into both nostrils as needed for congestion.   vitamin D3 50 MCG (2000 UT) Caps Take 1 capsule by mouth daily.         ROS:  A comprehensive review of systems was negative except for: Gastrointestinal: positive for nausea Integument/breast: positive for breast lump and breast tenderness Musculoskeletal: positive for low back pain  Blood pressure  135/88, pulse 96, temperature 98.4 F (36.9 C), temperature source Other (Comment), resp. rate 14, height _0  (1.626 m), weight 142 lb (64.4 kg), SpO2 95 %. Physical Exam Vitals reviewed.  Constitutional:      Appearance: Normal appearance.  HENT:     Head: Normocephalic.     Nose: Nose normal.  Eyes:     Extraocular Movements: Extraocular movements intact.  Cardiovascular:     Rate and Rhythm: Normal rate and regular rhythm.  Pulmonary:     Effort: Pulmonary effort is normal.     Breath sounds: Normal breath sounds.  Chest:  Breasts:    Right: Tenderness present. No mass, nipple discharge or skin change.     Left: Mass and tenderness present. No nipple discharge or skin change.     Comments: Left outer upper breast 1 oclock position firm nodule noted, could not appreciate any other areas  Abdominal:     General: There is no distension.     Palpations: Abdomen is soft.     Tenderness: There is no abdominal tenderness.  Musculoskeletal:        General: Normal range of motion.     Cervical back: Normal range of motion.     Comments: Upper mid back with sebaceous cyst with central pit 1cm, left mid to lower back with another cyst with central pit 1cm   Lymphadenopathy:     Upper Body:     Right upper body: No supraclavicular or axillary adenopathy.     Left upper body: No supraclavicular or axillary adenopathy.  Skin:    General: Skin is warm.  Neurological:     General: No focal deficit present.     Mental Status: She is alert and oriented to person, place, and time.  Psychiatric:        Mood and Affect: Mood normal.        Behavior: Behavior normal.        Thought Content: Thought content normal.    Results: CLINICAL DATA:  Evaluate placement of RIBBON and WING biopsy clips within the LEFT breast and RIBBON biopsy clip within the RIGHT breast following ultrasound-guided breast biopsies.   EXAM: 3D DIAGNOSTIC BILATERAL MAMMOGRAM POST ULTRASOUND BIOPSY   COMPARISON:   Previous exam(s).   FINDINGS: 3D Mammographic images were obtained following ultrasound guided biopsies of a LEFT breast mass at the 12 o'clock position (RIBBON clip), a LEFT breast mass at the 10 o'clock position (WING clip) and a RIGHT breast mass at  the 10 o'clock position (RIBBON clip).   The RIBBON biopsy marking clip is in expected position at the site of biopsy at the 12 o'clock position of the LEFT breast.   The WING biopsy marking clip is in expected position at the site of biopsy at the 10 o'clock position of the LEFT breast.   The RIBBON biopsy marking clip is in expected position at the site of biopsy at the 10 o'clock position of the RIGHT breast.   IMPRESSION: Appropriate positioning of the RIBBON shaped biopsy marking clip at the site of biopsy in the UPPER LEFT breast.   Appropriate positioning of the WING shaped biopsy marking clip at the site of biopsy in the UPPER INNER LEFT breast.   Appropriate positioning of the RIBBON shaped biopsy marking clip at the site of biopsy in the UPPER OUTER RIGHT breast.   Final Assessment: Post Procedure Mammograms for Marker Placement     Electronically Signed   By: Margarette Canada M.D.   On: 09/03/2021 14:08   ADDENDUM REPORT: 09/07/2021 16:16   ADDENDUM: PATHOLOGY revealed: Site A. BREAST, LEFT, 12:00, BIOPSY: - Invasive mammary carcinoma, grade 3. - See comment.   Pathology results are CONCORDANT with imaging findings, per Dr. Hassan Rowan.   PATHOLOGY revealed: Site B. BREAST, LEFT, 10:00, BIOPSY: - Invasive mammary carcinoma, grade 3. - See comment.   Pathology results are CONCORDANT with imaging findings, per Dr. Hassan Rowan.   PATHOLOGY revealed: Site C. BREAST, RIGHT, 10:00, BIOPSY: - Invasive 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 COMMENT: The greatest tumor dimension in the 12:00 left breast biopsy (part A) is 1.1 cm and in the 10:00 left breast biopsy (part B) is 0.8 cm. The 12:00 and 10:00 left  breast biopsies have identical morphologic features. The greatest tumor dimension in the right breast 10:00 biopsy part C) is 0.6 cm.   Pathology results are CONCORDANT with imaging findings, per Dr. Hassan Rowan.   Pathology results and recommendations below were discussed with patient by telephone on 09/07/2021. Patient reported biopsy site within normal limits with slight tenderness at the site. Post biopsy care instructions were reviewed, questions were answered and my direct phone number was provided to patient. Patient was instructed to call [facility] Mammography Department if any concerns or questions arise related to the biopsy.   RECOMMENDATIONS: 1. Surgical consultation. Request for surgical consultation was relayed to Kathi Der RT at [facility] Mammography Department by Electa Sniff RN on 09/07/2021.   2. Consider bilateral breast MRI to evaluate extent of breast disease.   Pathology results reported by Electa Sniff RN on 09/07/2021.     Electronically Signed   By: Margarette Canada M.D.   On: 09/07/2021 16:16   CLINICAL DATA:  70 year old female for tissue sampling of 1.3 cm UPPER LEFT breast mass (RIBBON clip), 1 cm UPPER INNER LEFT breast mass (WING clip) and 0.7 cm UPPER-OUTER RIGHT breast mass (RIBBON clip).   EXAM: ULTRASOUND GUIDED LEFT BREAST CORE NEEDLE BIOPSY X 2   ULTRASOUND GUIDED RIGHT BREAST CORE NEEDLE BIOPSY   COMPARISON:  Previous exam(s).   PROCEDURE: I met with the patient and we discussed the procedure of ultrasound-guided biopsy, including benefits and alternatives. We discussed the high likelihood of a successful procedure. We discussed the risks of the procedure, including infection, bleeding, tissue injury, clip migration, and inadequate sampling. Informed written consent was given. The usual time-out protocol was performed immediately prior to the procedure.  ULTRASOUND GUIDED LEFT BREAST CORE NEEDLE BIOPSY  #1 (1.3 cm 12 o'clock position mass-RIBBON clip):   Using sterile technique and 1% Lidocaine with and without epinephrine as local anesthetic, under direct ultrasound visualization, a 12 gauge spring-loaded device was used to perform biopsy of the 1.3 cm mass at the 12 o'clock position of the LEFT breast using a MEDIAL approach. At the conclusion of the procedure a RIBBON tissue marker clip was deployed into the biopsy cavity. Follow up 2 view mammogram was performed and dictated separately.   ULTRASOUND GUIDED LEFT BREAST CORE NEEDLE BIOPSY #2 (1 cm 10 o'clock position mass-WING clip):   Using sterile technique and 1% Lidocaine with and without epinephrine as local anesthetic, under direct ultrasound visualization, a 12 gauge spring-loaded device was used to perform biopsy of the 1 cm mass at the 10 o'clock position of the LEFT breast using a MEDIAL approach. At the conclusion of the procedure a WING tissue marker clip was deployed into the biopsy cavity. Follow up 2 view mammogram was performed and dictated separately.   ULTRASOUND GUIDED RIGHT BREAST CORE NEEDLE BIOPSY (0.7 cm 10 o'clock position mass-RIBBON clip):   Using sterile technique and 1% Lidocaine with and without epinephrine as local anesthetic, under direct ultrasound visualization, a 12 gauge spring-loaded device was used to perform biopsy of the 0.7 cm mass at the 10 o'clock position of the RIGHT breast using a LATERAL approach. At the conclusion of the procedure a RIBBON tissue marker clip was deployed into the biopsy cavity. Follow up 2 view mammogram was performed and dictated separately.   IMPRESSION: Ultrasound guided biopsies of a 1.3 cm UPPER LEFT breast mass (RIBBON clip), 1 cm UPPER INNER LEFT breast mass (WING clip), and 0.7 cm UPPER-OUTER RIGHT breast mass (RIBBON clip). No apparent complications.   Electronically Signed: By: Margarette Canada M.D. On: 09/03/2021 14:05    CLINICAL DATA:  70 year old  female with 2 palpable areas of concern in the left breast.   EXAM: DIGITAL DIAGNOSTIC BILATERAL MAMMOGRAM WITH TOMOSYNTHESIS AND CAD; ULTRASOUND RIGHT BREAST LIMITED; ULTRASOUND LEFT BREAST LIMITED   TECHNIQUE: Bilateral digital diagnostic mammography and breast tomosynthesis was performed. The images were evaluated with computer-aided detection.; Targeted ultrasound examination of the right breast was performed; Targeted ultrasound examination of the left breast was performed.   COMPARISON:  Previous exams.   ACR Breast Density Category b: There are scattered areas of fibroglandular density.   FINDINGS: Spot compression tomograms were performed over the palpable areas of concern in the left breast. There is an irregular mass in the slightly inner left breast measuring approximately 1 cm. There is an irregular mass in the upper-outer left breast at the additional site of palpable concern measuring 1.4 cm. There is a subtle area of possible distortion in the upper-outer right breast which is felt to persist on the additional spot compression CC as well as full paddle mL tomograms.   Targeted ultrasound of the left breast was performed. There is an irregular hypoechoic mass in the left breast at 10 o'clock 2 cm from nipple measuring 0.9 x 0.8 x 1 cm. This corresponds well with the mass seen in the inner left breast at mammography. There is an irregular hypoechoic mass in the left breast at 12 o'clock 7 cm from nipple measuring 1.3 x 1.3 x 1.1 cm. This corresponds well with the regular mass seen in the upper slightly outer left breast at mammography. No lymphadenopathy seen in the left axilla.   Targeted ultrasound of  the right breast was performed. There is an irregular hypoechoic shadowing mass at 10 o'clock 7 cm from nipple measuring 0.5 x 0.7 x 0.4 cm. This corresponds well with the distortion seen in the upper-outer right breast at mammography. No lymphadenopathy seen in  the right axilla.   IMPRESSION: 1. Suspicious palpable 0.9 cm mass in the left breast at the 10 o'clock position.   2. Suspicious palpable 1.3 cm mass in the left breast at the 12 o'clock position.   3. Suspicious 0.7 cm mass in the right breast at the 10 o'clock position.   RECOMMENDATION: Recommend ultrasound-guided core biopsy of the masses in the left breast at the 10 o'clock and 12 o'clock positioned and ultrasound-guided core biopsy of the mass in the right breast at the 10 o'clock position.   I have discussed the findings and recommendations with the patient. If applicable, a reminder letter will be sent to the patient regarding the next appointment.   BI-RADS CATEGORY  5: Highly suggestive of malignancy.     Electronically Signed   By: Everlean Alstrom M.D.   On: 08/24/2021 11:39    CLINICAL DATA:  Low back pain, greater than 6 weeks duration. Worsening groin pain. Pelvic insufficiency fracture. Low back pain radiates to the legs weakness and numbness.   EXAM: MRI LUMBAR SPINE WITHOUT CONTRAST   TECHNIQUE: Multiplanar, multisequence MR imaging of the lumbar spine was performed. No intravenous contrast was administered.   COMPARISON:  Radiography 11/30/2020   FINDINGS: Segmentation:  5 lumbar type vertebral bodies.   Alignment:  2 mm degenerative anterolisthesis L4-5.   Vertebrae: No fracture. 1 cm foci of low T1 signal and high T2 signal within the L5-S1 vertebrae. These are nonspecific and could be benign marrow foci, at metastatic disease is not excluded. Does the patient have a systemic malignancy? If not, consider follow-up study in 3 months see if these change. There is no sign of sacral insufficiency fracture. The study covers as low as S3.   Conus medullaris and cauda equina: Conus extends to the L2-3 level. Conus and cauda equina appear normal.   Paraspinal and other soft tissues: Negative   Disc levels:   T11-12: Shallow central disc  protrusion indents the ventral subarachnoid space but does not compress the cord or show foraminal extension.   T12-L1 through L3-4: Mild noncompressive disc bulges.   L4-5: Bilateral facet degeneration and hypertrophy with 2 mm of anterolisthesis. Mild bulging of the disc. No compressive stenosis. Findings at this level could relate to low back   L5-S1: Shallow central disc protrusion contacts the thecal sac but does not compress the thecal sac or S1 nerves. Minimal facet and ligamentous hypertrophy without edema.   IMPRESSION: No evidence of sacral insufficiency fracture. 1 cm foci of low T1 and high T2 signal within the L5 and S1 vertebral bodies. These are nonspecific and could be benign marrow foci or could be marrow space metastatic disease. Does the patient have a systemic malignancy? Not, consider follow-up study in months to assess for change.   T11-12: Shallow central disc herniation indents the thecal sac but does not compress the cord or show foraminal extension.   L4-5: Bilateral facet degeneration hypertrophy with 2 mm of anterolisthesis. Mild bulging disc. No compressive stenosis. Findings at this level could relate back pain.   L5-S1: Shallow central disc protrusion contacts the thecal sac in the S1 root sleeves but does not appear to cause neural compression. Mild facet degeneration this level  Minimal, non-compressive disc bulges from T12-L1 through L3-4.     Electronically Signed   By: Nelson Chimes M.D.   On: 09/16/2021 09:59  Assessment & Plan:  Elizabeth Wu is a 70 y.o. female with bilateral breast cancer. She also has an odd read on a recent MRI regarding the way the marrow looks. She does say has some left leg pain and uses a walker. She is getting B12 injections soon.   -We have discussed the options for surgery including the option of mastectomy with sentinel node biopsy versus partial mastectomy (lumpectomy) with sentinel node biopsy. We have  discussed that there is no difference in the prognosis or chance or recurrence or differences in survival between the two options. We have discussed the need for radiation with the lumpectomy, and we have discussed that she will be referred to oncology after our procedure to further discuss her options for chemotherapy and hormonal therapy if she qualifies.   We have discussed that if she decides to have a lumpectomy that we will need to get a needle placed into the area where the biopsy was performed, since we cannot palpate a mass. We have also discussed the need for injection of radiotracer and blue dye to perform the sentinel node biopsy.  We have discussed that the sentinel node biopsy tells [place] if the cancer has spread to the lymph nodes, and can help with plans for chemotherapy treatment and overall prognosis.    We have discussed that if the lumpectomy does not remove the entire cancer that she may have to have an additional procedure, and we have discussed that a positive sentinel node can require further removal of lymph nodes from the axilla but that recent research does not show any improvement in disease free survival and carries greater risk for lymphedema.    We have discussed that these are big discussions, and that the risk from the operations are similar including risk of bleeding, risk of infection, and risk of needing additional surgeries. We have discussed the likely need for an overnight stay with a mastectomy and a drain that will remain in place for about 1 week.    She is leaning towards total mastectomy and sentinel node on both sides and understands she will stay overnight, have a drain and get Physical therapy.   Will send Dr. Delton Coombes a message regarding the patient's MRI results to see if he thinks she needs additional testing before surgery. Will get her referred to Oncology too.   All questions were answered to the satisfaction of the patient and family.  Called  patient and discussed above with her 10/03/2021 as she had called the office and wanted to discuss further.  Virl Cagey 09/30/2021, 2:12 PM

## 2021-09-30 NOTE — Patient Instructions (Addendum)
Options:  1.) Lumpectomy (removing part of breast) X2 on the left and X 1 on the right; would have to get 3 separate tags placed by radiology before the operation, and sentinel nodes on the left and right armpit; you have to do radiation to both breast following surgery to treat the remaining breast (5 weeks M-F)   2.) Mastectomy (removing the whole breast) on the right and left, and sentinel nodes on the left and right armpit; you stay overnight and have a drain on each side that you empty for about 1 week  Call and let us know hich option you decide or if questions.  I will let Oncology, Dr. Delton Coombes know about the back MRI so he can weigh in on anything we need to do extra before any surgery. Will also go ahead and refer you to see him.  Surgical Options for Early-Stage Breast Cancer Surgery is usually the first treatment for early-stage breast cancer. Most women have two surgery options. One is called partial mastectomy, or breast-sparing or breast-conserving surgery, and the other is called mastectomy. Both surgeries have good survival rates. Breast cancer is different for everyone, even in its early stage. The best treatment for one person might not be the best treatment for another. Learn as much as you can about your cancer and work closely with your health care providers to make the choice that produces the best results for you. What is partial mastectomy? Partial mastectomy, also called breast-sparing surgery or breast-conserving surgery, is surgery to remove the cancer along with some normal breast tissue that surrounds it. Lymph nodes from under the arm may also be removed and tested to find out if the cancer has spread. If cancer is located near the chest wall, part of the chest wall lining may also be removed. What is a mastectomy? A mastectomy is surgery to remove the cancer along with the entire breast tissue. There are several types of mastectomy: Simple or total mastectomy. In  this surgery the entire breast is removed, including breast tissue, nipple, areola and skin around the breast. Some lymph nodes may also be removed from under the arm. If cancer is located near the chest wall, part of the chest wall lining may also be removed. Skin-sparing mastectomy. In this surgery the breast tissue, nipple, and areola are removed and most of the skin over the breast is left in place. This surgery results in less scar tissue than other mastectomy surgeries, which allows for a more natural breast reconstruction. Nipple-sparing mastectomy. In this surgery, breast tissue is removed but the skin and nipple is left in place. The tissue under the nipple and areola may be removed if cancer is found in the area. This may be an option for women who choose to have breast reconstruction after mastectomy. Modified radical mastectomy. This surgery is the same as a simple mastectomy but also includes removing lymph nodes from under the arm (axillary lymph node dissection). Radical mastectomy. In this surgery the entire breast, the lymph nodes under the arm, and the chest wall muscles under the breast are removed. This surgery is rarely done now. A modified radical mastectomy is preferred because it is just as effective, but with the added advantage of fewer side effects. What are some advantages and disadvantages of these surgeries? Partial mastectomy Advantages of partial mastectomy include: Keeping most of your breast tissue intact, allowing for a more natural look to the breast. Easier recovery when compared to a mastectomy. Ability to go  home on the day of the procedure. Disadvantages of partial mastectomy include: Slightly higher risk that your cancer will come back. Needing more surgery at a later time. Requiring radiation therapy after surgery, which has side effects and possible complications. This is done to reduce the chances of breast cancer returning. Mastectomy Advantages of a  mastectomy include: Not needing to have radiation therapy or other treatments after surgery. Lower chances of your cancer coming back. Disadvantages of a mastectomy include: Longer recovery time compared to partial mastectomy. Possibility of more complications. Requiring additional surgeries to reconstruct your breast. Questions to ask Here are some questions to ask about each surgery: What will my recovery be like? How will my breast look and feel? What are the possible risks and complications of the surgery? What additional treatment might I need after surgery? What are the risks and complications of radiation therapy? What are the risks and complications of chemotherapy? Will I be able to have breast reconstruction? Where to find more information [place]: https://www.cancer.gov American [employer] Society: http://www.cancer.org Summary Surgery is usually the first treatment for early-stage breast cancer. Most women have two surgery options. One option is called partial mastectomy, or breast-sparing or breast-conserving surgery, and the other is called mastectomy. Both surgeries have good survival rates. Each option has advantages and disadvantages to consider. The best treatment for one person might not be the best treatment for you. Learn as much as you can about your cancer and work closely with your health care providers to make the choice that produces the best results for you. This information is not intended to replace advice given to you by your health care provider. Make sure you discuss any questions you have with your health care provider. Document Revised: 04/22/2020 Document Reviewed: 04/22/2020 Elsevier Patient Education  2022 [place] Lymph Node Biopsy, Care After The following information offers guidance on how to care for yourself after your procedure. Your health care provider may also give you more specific instructions. If you have  problems or questions, contact your health care provider. What can I expect after the procedure? After the procedure, it is common to have: Blue urine or stool for the next 24-48 hours. This is normal. It is caused by the dye used during the procedure. Blue skin at the injection site. This may last for up to 8 weeks. Numbness, tingling, or pain near your incision site. Swelling or bruising near your incision. Follow these instructions at home: Incision care   Follow instructions from your health care provider about how to take care of your incision. Make sure you: Wash your hands with soap and water for at least 20 seconds before and after you change your bandage (dressing). If soap and water are not available, use hand sanitizer. Change your dressing as told by your health care provider. Leave stitches (sutures), skin glue, or adhesive strips in place. These skin closures may need to stay in place for 2 weeks or longer. If adhesive strip edges start to loosen and curl up, you may trim the loose edges. Do not remove adhesive strips completely unless your health care provider tells you to do that. Check your incision area every day for signs of infection. Check for: Redness, swelling, or more pain. Fluid or blood. Warmth. Pus or a bad smell. Do not take baths, swim, or use a hot tub until your health care provider approves. Ask your health care provider if you can take showers. You may be able  to shower 24 hours after your procedure. After a shower, pat the incision area dry with a clean towel. Do not rub the incision. That could cause bleeding. Activity Avoid activities that take a lot of effort. If you were given a sedative during the procedure, it can affect you for several hours. Do not drive or operate machinery until your health care provider says that it is safe. Return to your normal activities as told by your health care provider. Ask your health care provider what activities are safe  for you. General instructions Take over-the-counter and prescription medicines only as told by your health care provider. You may resume your regular diet. If the procedure was done on or near the lymph nodes under your arm (axillary lymph nodes), do not have your blood pressure taken or have blood drawn from the arm on the side of the biopsy until your health care provider says it is okay. You may need to be screened for extra fluid around the lymph nodes (lymphedema). Follow instructions from your health care provider about how often you should be checked. Keep all follow-up visits. This is important. Contact a health care provider if: Your pain medicine is not helping. You have nausea and vomiting. You have redness, swelling, or more pain around your biopsy site. You have fluid, blood, pus, or a bad smell coming from your incision. Your incision feels warm to the touch, you have a fever, or chills. You have any new bruising. Get help right away if: You have pain that is getting worse, and your medicine is not helping. You have vomiting that will not stop. You have chest pain or trouble breathing. These symptoms may represent a serious problem that is an emergency. Do not wait to see if the symptoms will go away. Get medical help right away. Call your local emergency services (911 in the [place] Do not drive yourself to the hospital. Summary After the procedure, it is common to have blue urine or stool for the next 24-48 hours. Take over-the-counter and prescription medicines only as told by your health care provider. Follow instructions from your health care provider about how to take care of your incision. Check your incision area every day for signs of infection. Get help right away if you have chest pain or trouble breathing. This information is not intended to replace advice given to you by your health care provider. Make sure you discuss any questions you have with your health care  provider. Document Revised: 08/20/2020 Document Reviewed: 08/20/2020 Elsevier Patient Education  [place].

## 2021-10-04 ENCOUNTER — Other Ambulatory Visit (HOSPITAL_COMMUNITY): Payer: Self-pay

## 2021-10-04 DIAGNOSIS — C50412 Malignant neoplasm of upper-outer quadrant of left female breast: Secondary | ICD-10-CM

## 2021-10-04 DIAGNOSIS — C50411 Malignant neoplasm of upper-outer quadrant of right female breast: Secondary | ICD-10-CM

## 2021-10-04 DIAGNOSIS — C50212 Malignant neoplasm of upper-inner quadrant of left female breast: Secondary | ICD-10-CM

## 2021-10-04 NOTE — Progress Notes (Signed)
Order placed for PET scan per Dr. Katragadda 

## 2021-10-05 ENCOUNTER — Other Ambulatory Visit (HOSPITAL_COMMUNITY): Payer: Self-pay | Admitting: General Surgery

## 2021-10-05 DIAGNOSIS — C50412 Malignant neoplasm of upper-outer quadrant of left female breast: Secondary | ICD-10-CM

## 2021-10-05 DIAGNOSIS — C50212 Malignant neoplasm of upper-inner quadrant of left female breast: Secondary | ICD-10-CM

## 2021-10-05 DIAGNOSIS — C50411 Malignant neoplasm of upper-outer quadrant of right female breast: Secondary | ICD-10-CM

## 2021-10-06 ENCOUNTER — Ambulatory Visit (INDEPENDENT_AMBULATORY_CARE_PROVIDER_SITE_OTHER): Payer: Medicare HMO | Admitting: *Deleted

## 2021-10-06 ENCOUNTER — Other Ambulatory Visit: Payer: Self-pay

## 2021-10-06 DIAGNOSIS — Z23 Encounter for immunization: Secondary | ICD-10-CM

## 2021-10-06 DIAGNOSIS — E538 Deficiency of other specified B group vitamins: Secondary | ICD-10-CM

## 2021-10-06 MED ORDER — CYANOCOBALAMIN 1000 MCG/ML IJ SOLN
1000.0000 ug | Freq: Once | INTRAMUSCULAR | Status: AC
  Administered 2021-10-06: 1000 ug via INTRAMUSCULAR

## 2021-10-06 NOTE — Progress Notes (Signed)
Vitamin b12 and flu shot given pt tolerated well.

## 2021-10-07 NOTE — H&P (Signed)
[facility] History and Physical   Reason for Referral: Right and left breast cancer  Referring Physician:  [name], DO     Chief Complaint   New Patient (Initial Visit)        Elizabeth Wu is a 70 y.o. female.  HPI:  The patient had her normal screening mammogram in October and had areas of concern of the left and right breast.  She has never had any abnormal mammograms in the past. She does have some areas that she can feel.    The patient has no history of any masses, lumps, bumps, nipple changes or discharge. She is unsure when she started her period or had menopause. She has never been pregnant. She has no history of any breast cancer. She is here today with a niece.  She has never had any previous biopsies or concerning areas on mammogram.  She has not had any chest radiation.   She says she has seen a neurologist recently about a sharp pain on her left scalp and is getting B12 injections, starting soon for a low B12 she reports. Her last Vit B12 level I see is 284 (which is within the normal limits of the range but in the lower end).    She denies any cardiac history. She did have a MRI of her back recently with a read that is concerned about systemic cancer due to the marrow appearance. She has no other bone pain and no axillary adenopathy on her [place] with her mammograms.          Past Medical History:  Diagnosis Date   Allergy     DJD (degenerative joint disease)     Hyperlipidemia     Osteopenia             Past Surgical History:  Procedure Laterality Date   APPENDECTOMY       HERNIA REPAIR       TONSILLECTOMY       TONSILLECTOMY               Family History  Problem Relation Age of Onset   Alzheimer's disease Mother     Cancer Father          stomach cancer   Diabetes Sister     Heart disease Brother          MI      Social History         Tobacco Use   Smoking status: Every Day      Packs/day: 0.50      Years: 40.00       Pack years: 20.00      Types: Cigarettes   Smokeless tobacco: Never  Vaping Use   Vaping Use: Never used  Substance Use Topics   Alcohol use: No   Drug use: No      Medications: I have reviewed the patient's current medications. Allergies as of 09/30/2021   No Known Allergies         Medication List           Accurate as of September 30, 2021  2:12 PM. If you have any questions, ask your nurse or doctor.              acetaminophen-codeine 300-30 MG tablet Commonly known as: TYLENOL #3 Take 1 tablet by mouth every 8 (eight) hours as needed for moderate pain.    amoxicillin-clavulanate 875-125 MG tablet Commonly known as: AUGMENTIN Take 1 tablet  by mouth 2 (two) times daily. Take all of this medication    Cheratussin AC 100-10 MG/5ML syrup Generic drug: guaiFENesin-codeine Take 5 mLs by mouth every 4 (four) hours as needed for cough.    Fish Oil 1000 MG Caps Take 1 capsule by mouth daily.    folic acid 1 MG tablet Commonly known as: FOLVITE Take 1 mg by mouth daily.    ibuprofen 600 MG tablet Commonly known as: ADVIL Take 1 tablet (600 mg total) by mouth 2 (two) times daily as needed for moderate pain.    linaclotide 290 MCG Caps capsule Commonly known as: LINZESS Take 290 mcg by mouth daily before breakfast.    losartan 50 MG tablet Commonly known as: COZAAR Take 1 tablet (50 mg total) by mouth daily.    pantoprazole 20 MG tablet Commonly known as: PROTONIX Take 1 tablet (20 mg total) by mouth daily.    simvastatin 20 MG tablet Commonly known as: ZOCOR Take 1 tablet (20 mg total) by mouth at bedtime.    sodium chloride 0.65 % Soln nasal spray Commonly known as: OCEAN Place 1 spray into both nostrils as needed for congestion.    vitamin D3 50 MCG (2000 UT) Caps Take 1 capsule by mouth daily.               ROS:  A comprehensive review of systems was negative except for: Gastrointestinal: positive for nausea Integument/breast: positive for  breast lump and breast tenderness Musculoskeletal: positive for low back pain   Blood pressure 135/88, pulse 96, temperature 98.4 F (36.9 C), temperature source Other (Comment), resp. rate 14, height 5' 4"  (1.626 m), weight 142 lb (64.4 kg), SpO2 95 %. Physical Exam Vitals reviewed.  Constitutional:      Appearance: Normal appearance.  HENT:     Head: Normocephalic.     Nose: Nose normal.  Eyes:     Extraocular Movements: Extraocular movements intact.  Cardiovascular:     Rate and Rhythm: Normal rate and regular rhythm.  Pulmonary:     Effort: Pulmonary effort is normal.     Breath sounds: Normal breath sounds.  Chest:  Breasts:    Right: Tenderness present. No mass, nipple discharge or skin change.     Left: Mass and tenderness present. No nipple discharge or skin change.     Comments: Left outer upper breast 1 oclock position firm nodule noted, could not appreciate any other areas  Abdominal:     General: There is no distension.     Palpations: Abdomen is soft.     Tenderness: There is no abdominal tenderness.  Musculoskeletal:        General: Normal range of motion.     Cervical back: Normal range of motion.     Comments: Upper mid back with sebaceous cyst with central pit 1cm, left mid to lower back with another cyst with central pit 1cm   Lymphadenopathy:     Upper Body:     Right upper body: No supraclavicular or axillary adenopathy.     Left upper body: No supraclavicular or axillary adenopathy.  Skin:    General: Skin is warm.  Neurological:     General: No focal deficit present.     Mental Status: She is alert and oriented to person, place, and time.  Psychiatric:        Mood and Affect: Mood normal.        Behavior: Behavior normal.        Thought  Content: Thought content normal.      Results: CLINICAL DATA:  Evaluate placement of RIBBON and WING biopsy clips within the LEFT breast and RIBBON biopsy clip within the RIGHT breast following ultrasound-guided  breast biopsies.   EXAM: 3D DIAGNOSTIC BILATERAL MAMMOGRAM POST ULTRASOUND BIOPSY   COMPARISON:  Previous exam(s).   FINDINGS: 3D Mammographic images were obtained following ultrasound guided biopsies of a LEFT breast mass at the 12 o'clock position (RIBBON clip), a LEFT breast mass at the 10 o'clock position (WING clip) and a RIGHT breast mass at the 10 o'clock position (RIBBON clip).   The RIBBON biopsy marking clip is in expected position at the site of biopsy at the 12 o'clock position of the LEFT breast.   The WING biopsy marking clip is in expected position at the site of biopsy at the 10 o'clock position of the LEFT breast.   The RIBBON biopsy marking clip is in expected position at the site of biopsy at the 10 o'clock position of the RIGHT breast.   IMPRESSION: Appropriate positioning of the RIBBON shaped biopsy marking clip at the site of biopsy in the UPPER LEFT breast.   Appropriate positioning of the WING shaped biopsy marking clip at the site of biopsy in the UPPER INNER LEFT breast.   Appropriate positioning of the RIBBON shaped biopsy marking clip at the site of biopsy in the UPPER OUTER RIGHT breast.   Final Assessment: Post Procedure Mammograms for Marker Placement     Electronically Signed   By: Margarette Canada M.D.   On: 09/03/2021 14:08   ADDENDUM REPORT: 09/07/2021 16:16   ADDENDUM: PATHOLOGY revealed: Site A. BREAST, LEFT, 12:00, BIOPSY: - Invasive mammary carcinoma, grade 3. - See comment.   Pathology results are CONCORDANT with imaging findings, per Dr. Hassan Rowan.   PATHOLOGY revealed: Site B. BREAST, LEFT, 10:00, BIOPSY: - Invasive mammary carcinoma, grade 3. - See comment.   Pathology results are CONCORDANT with imaging findings, per Dr. Hassan Rowan.   PATHOLOGY revealed: Site C. BREAST, RIGHT, 10:00, BIOPSY: - Invasive 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 COMMENT: The greatest tumor dimension in the 12:00 left breast biopsy  (part A) is 1.1 cm and in the 10:00 left breast biopsy (part B) is 0.8 cm. The 12:00 and 10:00 left breast biopsies have identical morphologic features. The greatest tumor dimension in the right breast 10:00 biopsy part C) is 0.6 cm.   Pathology results are CONCORDANT with imaging findings, per Dr. Hassan Rowan.   Pathology results and recommendations below were discussed with patient by telephone on 09/07/2021. Patient reported biopsy site within normal limits with slight tenderness at the site. Post biopsy care instructions were reviewed, questions were answered and my direct phone number was provided to patient. Patient was instructed to call [facility] Mammography Department if any concerns or questions arise related to the biopsy.   RECOMMENDATIONS: 1. Surgical consultation. Request for surgical consultation was relayed to Kathi Der RT at [facility] Mammography Department by Electa Sniff RN on 09/07/2021.   2. Consider bilateral breast MRI to evaluate extent of breast disease.   Pathology results reported by Electa Sniff RN on 09/07/2021.     Electronically Signed   By: Margarette Canada M.D.   On: 09/07/2021 16:16   CLINICAL DATA:  70 year old female for tissue sampling of 1.3 cm UPPER LEFT breast mass (RIBBON clip), 1 cm UPPER INNER LEFT breast mass (WING clip) and 0.7 cm  UPPER-OUTER RIGHT breast mass (RIBBON clip).   EXAM: ULTRASOUND GUIDED LEFT BREAST CORE NEEDLE BIOPSY X 2   ULTRASOUND GUIDED RIGHT BREAST CORE NEEDLE BIOPSY   COMPARISON:  Previous exam(s).   PROCEDURE: I met with the patient and we discussed the procedure of ultrasound-guided biopsy, including benefits and alternatives. We discussed the high likelihood of a successful procedure. We discussed the risks of the procedure, including infection, bleeding, tissue injury, clip migration, and inadequate sampling. Informed written consent was given. The usual time-out protocol  was performed immediately prior to the procedure.   ULTRASOUND GUIDED LEFT BREAST CORE NEEDLE BIOPSY #1 (1.3 cm 12 o'clock position mass-RIBBON clip):   Using sterile technique and 1% Lidocaine with and without epinephrine as local anesthetic, under direct ultrasound visualization, a 12 gauge spring-loaded device was used to perform biopsy of the 1.3 cm mass at the 12 o'clock position of the LEFT breast using a MEDIAL approach. At the conclusion of the procedure a RIBBON tissue marker clip was deployed into the biopsy cavity. Follow up 2 view mammogram was performed and dictated separately.   ULTRASOUND GUIDED LEFT BREAST CORE NEEDLE BIOPSY #2 (1 cm 10 o'clock position mass-WING clip):   Using sterile technique and 1% Lidocaine with and without epinephrine as local anesthetic, under direct ultrasound visualization, a 12 gauge spring-loaded device was used to perform biopsy of the 1 cm mass at the 10 o'clock position of the LEFT breast using a MEDIAL approach. At the conclusion of the procedure a WING tissue marker clip was deployed into the biopsy cavity. Follow up 2 view mammogram was performed and dictated separately.   ULTRASOUND GUIDED RIGHT BREAST CORE NEEDLE BIOPSY (0.7 cm 10 o'clock position mass-RIBBON clip):   Using sterile technique and 1% Lidocaine with and without epinephrine as local anesthetic, under direct ultrasound visualization, a 12 gauge spring-loaded device was used to perform biopsy of the 0.7 cm mass at the 10 o'clock position of the RIGHT breast using a LATERAL approach. At the conclusion of the procedure a RIBBON tissue marker clip was deployed into the biopsy cavity. Follow up 2 view mammogram was performed and dictated separately.   IMPRESSION: Ultrasound guided biopsies of a 1.3 cm UPPER LEFT breast mass (RIBBON clip), 1 cm UPPER INNER LEFT breast mass (WING clip), and 0.7 cm UPPER-OUTER RIGHT breast mass (RIBBON clip). No apparent complications.    Electronically Signed: By: Margarette Canada M.D. On: 09/03/2021 14:05     CLINICAL DATA:  70 year old female with 2 palpable areas of concern in the left breast.   EXAM: DIGITAL DIAGNOSTIC BILATERAL MAMMOGRAM WITH TOMOSYNTHESIS AND CAD; ULTRASOUND RIGHT BREAST LIMITED; ULTRASOUND LEFT BREAST LIMITED   TECHNIQUE: Bilateral digital diagnostic mammography and breast tomosynthesis was performed. The images were evaluated with computer-aided detection.; Targeted ultrasound examination of the right breast was performed; Targeted ultrasound examination of the left breast was performed.   COMPARISON:  Previous exams.   ACR Breast Density Category b: There are scattered areas of fibroglandular density.   FINDINGS: Spot compression tomograms were performed over the palpable areas of concern in the left breast. There is an irregular mass in the slightly inner left breast measuring approximately 1 cm. There is an irregular mass in the upper-outer left breast at the additional site of palpable concern measuring 1.4 cm. There is a subtle area of possible distortion in the upper-outer right breast which is felt to persist on the additional spot compression CC as well as full paddle mL tomograms.   Targeted  ultrasound of the left breast was performed. There is an irregular hypoechoic mass in the left breast at 10 o'clock 2 cm from nipple measuring 0.9 x 0.8 x 1 cm. This corresponds well with the mass seen in the inner left breast at mammography. There is an irregular hypoechoic mass in the left breast at 12 o'clock 7 cm from nipple measuring 1.3 x 1.3 x 1.1 cm. This corresponds well with the regular mass seen in the upper slightly outer left breast at mammography. No lymphadenopathy seen in the left axilla.   Targeted ultrasound of the right breast was performed. There is an irregular hypoechoic shadowing mass at 10 o'clock 7 cm from nipple measuring 0.5 x 0.7 x 0.4 cm. This corresponds  well with the distortion seen in the upper-outer right breast at mammography. No lymphadenopathy seen in the right axilla.   IMPRESSION: 1. Suspicious palpable 0.9 cm mass in the left breast at the 10 o'clock position.   2. Suspicious palpable 1.3 cm mass in the left breast at the 12 o'clock position.   3. Suspicious 0.7 cm mass in the right breast at the 10 o'clock position.   RECOMMENDATION: Recommend ultrasound-guided core biopsy of the masses in the left breast at the 10 o'clock and 12 o'clock positioned and ultrasound-guided core biopsy of the mass in the right breast at the 10 o'clock position.   I have discussed the findings and recommendations with the patient. If applicable, a reminder letter will be sent to the patient regarding the next appointment.   BI-RADS CATEGORY  5: Highly suggestive of malignancy.     Electronically Signed   By: Everlean Alstrom M.D.   On: 08/24/2021 11:39     CLINICAL DATA:  Low back pain, greater than 6 weeks duration. Worsening groin pain. Pelvic insufficiency fracture. Low back pain radiates to the legs weakness and numbness.   EXAM: MRI LUMBAR SPINE WITHOUT CONTRAST   TECHNIQUE: Multiplanar, multisequence MR imaging of the lumbar spine was performed. No intravenous contrast was administered.   COMPARISON:  Radiography 11/30/2020   FINDINGS: Segmentation:  5 lumbar type vertebral bodies.   Alignment:  2 mm degenerative anterolisthesis L4-5.   Vertebrae: No fracture. 1 cm foci of low T1 signal and high T2 signal within the L5-S1 vertebrae. These are nonspecific and could be benign marrow foci, at metastatic disease is not excluded. Does the patient have a systemic malignancy? If not, consider follow-up study in 3 months see if these change. There is no sign of sacral insufficiency fracture. The study covers as low as S3.   Conus medullaris and cauda equina: Conus extends to the L2-3 level. Conus and cauda equina appear  normal.   Paraspinal and other soft tissues: Negative   Disc levels:   T11-12: Shallow central disc protrusion indents the ventral subarachnoid space but does not compress the cord or show foraminal extension.   T12-L1 through L3-4: Mild noncompressive disc bulges.   L4-5: Bilateral facet degeneration and hypertrophy with 2 mm of anterolisthesis. Mild bulging of the disc. No compressive stenosis. Findings at this level could relate to low back   L5-S1: Shallow central disc protrusion contacts the thecal sac but does not compress the thecal sac or S1 nerves. Minimal facet and ligamentous hypertrophy without edema.   IMPRESSION: No evidence of sacral insufficiency fracture. 1 cm foci of low T1 and high T2 signal within the L5 and S1 vertebral bodies. These are nonspecific and could be benign marrow foci or could be  marrow space metastatic disease. Does the patient have a systemic malignancy? Not, consider follow-up study in months to assess for change.   T11-12: Shallow central disc herniation indents the thecal sac but does not compress the cord or show foraminal extension.   L4-5: Bilateral facet degeneration hypertrophy with 2 mm of anterolisthesis. Mild bulging disc. No compressive stenosis. Findings at this level could relate back pain.   L5-S1: Shallow central disc protrusion contacts the thecal sac in the S1 root sleeves but does not appear to cause neural compression. Mild facet degeneration this level   Minimal, non-compressive disc bulges from T12-L1 through L3-4.     Electronically Signed   By: Nelson Chimes M.D.   On: 09/16/2021 09:59   Assessment & Plan:  Elizabeth Wu is a 70 y.o. female with bilateral breast cancer. She also has an odd read on a recent MRI regarding the way the marrow looks. She does say has some left leg pain and uses a walker. She is getting B12 injections soon.    -We have discussed the options for surgery including the option of  mastectomy with sentinel node biopsy versus partial mastectomy (lumpectomy) with sentinel node biopsy. We have discussed that there is no difference in the prognosis or chance or recurrence or differences in survival between the two options. We have discussed the need for radiation with the lumpectomy, and we have discussed that she will be referred to oncology after our procedure to further discuss her options for chemotherapy and hormonal therapy if she qualifies.    We have discussed that if she decides to have a lumpectomy that we will need to get a needle placed into the area where the biopsy was performed, since we cannot palpate a mass. We have also discussed the need for injection of radiotracer and blue dye to perform the sentinel node biopsy.   We have discussed that the sentinel node biopsy tells [place] if the cancer has spread to the lymph nodes, and can help with plans for chemotherapy treatment and overall prognosis.     We have discussed that if the lumpectomy does not remove the entire cancer that she may have to have an additional procedure, and we have discussed that a positive sentinel node can require further removal of lymph nodes from the axilla but that recent research does not show any improvement in disease free survival and carries greater risk for lymphedema.     We have discussed that these are big discussions, and that the risk from the operations are similar including risk of bleeding, risk of infection, and risk of needing additional surgeries. We have discussed the likely need for an overnight stay with a mastectomy and a drain that will remain in place for about 1 week.     She is leaning towards total mastectomy and sentinel node on both sides and understands she will stay overnight, have a drain and get Physical therapy.    Will send Dr. Delton Coombes a message regarding the patient's MRI results to see if he thinks she needs additional testing before surgery. Will get her  referred to Oncology too.     All questions were answered to the satisfaction of the patient and family.   Called patient and discussed above with her 10/03/2021 as she had called the office and wanted to discuss further.   Virl Cagey 09/30/2021, 2:12 PM

## 2021-10-19 ENCOUNTER — Telehealth: Payer: Self-pay | Admitting: *Deleted

## 2021-10-19 NOTE — Telephone Encounter (Signed)
Received call from patient [phone]~ telephone.   Reports that she has been having increased nausea and loss of appetite. States that she is drinking Ensure, but she is not eating much. Reports some dysphagia with solid foods. Reports increased weakness due to lack of appetite.   Advised to contact PCP for loss of appetite and dysphagia.   Reports that she has B mastectomy scheduled on 10/27/2021. Inquired if surgery will need to be rescheduled due to increased weakness.   Advised that labs and evaluation by anesthesiology will be completed at pre-op appointment on 10/25/2021. A determination will be made at that time as to risk/ benefit of procedure.

## 2021-10-21 ENCOUNTER — Other Ambulatory Visit: Payer: Self-pay

## 2021-10-21 ENCOUNTER — Encounter (HOSPITAL_COMMUNITY)
Admission: RE | Admit: 2021-10-21 | Discharge: 2021-10-21 | Disposition: A | Discharge status: Home / Self Care | Payer: Medicare HMO | Source: Ambulatory Visit | Attending: Hematology | Admitting: Hematology

## 2021-10-21 DIAGNOSIS — C50212 Malignant neoplasm of upper-inner quadrant of left female breast: Secondary | ICD-10-CM | POA: Insufficient documentation

## 2021-10-21 DIAGNOSIS — N200 Calculus of kidney: Secondary | ICD-10-CM | POA: Diagnosis not present

## 2021-10-21 DIAGNOSIS — K8020 Calculus of gallbladder without cholecystitis without obstruction: Secondary | ICD-10-CM | POA: Diagnosis not present

## 2021-10-21 DIAGNOSIS — C50412 Malignant neoplasm of upper-outer quadrant of left female breast: Secondary | ICD-10-CM | POA: Insufficient documentation

## 2021-10-21 DIAGNOSIS — C50411 Malignant neoplasm of upper-outer quadrant of right female breast: Secondary | ICD-10-CM | POA: Diagnosis not present

## 2021-10-21 DIAGNOSIS — C50912 Malignant neoplasm of unspecified site of left female breast: Secondary | ICD-10-CM | POA: Diagnosis not present

## 2021-10-21 DIAGNOSIS — I2510 Atherosclerotic heart disease of native coronary artery without angina pectoris: Secondary | ICD-10-CM | POA: Diagnosis not present

## 2021-10-21 IMAGING — CT NM PET TUM IMG INITIAL (PI) SKULL BASE T - THIGH
1 of 7 series · 1 of 25 positions shown · non-contrast
Comparison: CT abdomen pelvis [DATE].

CLINICAL DATA: Initial treatment strategy for breast cancer.

EXAM:
NUCLEAR MEDICINE PET SKULL BASE TO THIGH
TECHNIQUE: 8.0 mCi F-18 FDG was injected intravenously. Full-ring PET imaging
was performed from the skull base to thigh after the radiotracer. CT
data was obtained and used for attenuation correction and anatomic
localization.
Fasting blood glucose: 134 mg/dl

[Series 3: ctac · axial · 3.0mm · 0.98mm/px · 1 of 293 slices shown]
[im 293/293  brain]
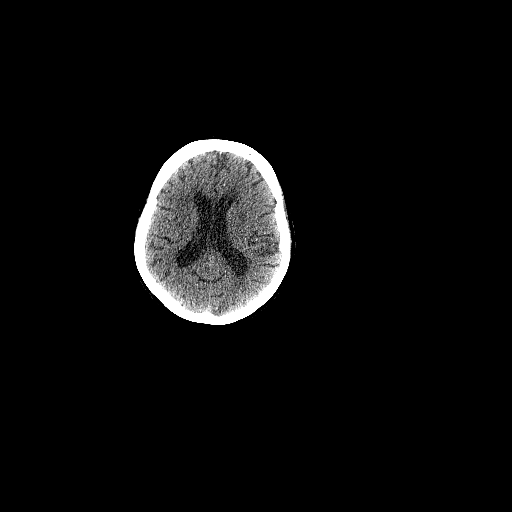

[1 of 25 positions shown; findings below may reference images not displayed]

FINDINGS: Mediastinal blood pool activity: SUV max

Liver activity: SUV max NA

NECK:

No abnormal hypermetabolism.

Incidental CT findings:

None.

CHEST:

Spiculated mass in the superior segment right lower lobe measures
2.3 x 3.4 cm (7/101) with an SUV max 8.1. Hypermetabolic right
middle lobe nodule measures 9 mm (7/132) with an SUV max 2.7. No
hypermetabolic mediastinal, hilar or axillary lymph nodes.

A 1.5 x 2.1 cm nodule in the upper outer left breast has an SUV max
2.8. 12 mm nodule in the supra-areolar medial left breast has an SUV
max 3.1.

Subcutaneous soft tissue nodule overlying the posterior lower left
ribs measures 1.4 cm (3/110) with an SUV max 2.8.

Incidental CT findings:

Atherosclerotic calcification of the aorta, aortic valve and
coronary arteries. There may be a calcified ductus diverticulum
along the inferior margin of the aortic arch. Heart size normal. No
pericardial or pleural effusion. Extensive perilymphatic nodularity
throughout the lungs, in addition to the nodules and masses
described above.

ABDOMEN/PELVIS:

No abnormal hypermetabolism in the liver, adrenal glands, spleen or
pancreas. 10 mm left omental nodule (3/145), SUV max 2.3. Soft
tissue nodule in left pericolic mesentery measures 7 mm (3/180) with
an SUV max 1.8. Soft tissue nodule posterior to the left psoas
muscle measures 9 mm (3/195) with an SUV max 2.1.

Incidental CT findings:

Subcentimeter low-attenuation lesion in the left hepatic lobe, too
small to characterize. Liver margin is irregular. Stone in the
gallbladder. Adrenal glands and right kidney are unremarkable.
Punctate stone in the left kidney. Spleen, pancreas, stomach and
bowel are unremarkable. Tiny nodule posterior to the upper pole
right kidney measures approximately 3 mm, too small for PET
resolution but likely metastatic. Additional scattered small
subcutaneous nodules, too small for PET resolution but also likely
metastatic. Atherosclerotic calcification of the aorta.

SKELETON:

No abnormal hypermetabolism.

Incidental CT findings:

Degenerative changes in the spine.  Osteopenia.
IMPRESSION: 1. Metastatic breast cancer as evidenced by hypermetabolic nodules
in the left breast, pulmonary lymphangitic carcinomatosis,
peritoneal carcinomatosis and hypermetabolic subcutaneous soft
tissue nodules.
2. Cirrhosis.
3. Cholelithiasis.
4. Left renal stone.
5. Aortic atherosclerosis ([NQ]-[NQ]). Coronary artery
calcification.

## 2021-10-21 MED ORDER — FLUDEOXYGLUCOSE F - 18 (FDG) INJECTION
7.9570 | Freq: Once | INTRAVENOUS | Status: AC | PRN
  Administered 2021-10-21: 7.957 via INTRAVENOUS

## 2021-10-22 NOTE — Patient Instructions (Signed)
Elizabeth Wu  10/22/2021     @PREFPERIOPPHARMACY @   Your procedure is scheduled on  10/27/2021.   Report to [facility] at  0730 A.M.   Call this number if you have problems the morning of surgery:  [phone]   Remember:  Do not eat or drink after midnight.      Take these medicines the morning of surgery with A SIP OF WATER                                                None     Do not wear jewelry, make-up or nail polish.  Do not wear lotions, powders, or perfumes, or deodorant.  Do not shave 48 hours prior to surgery.  Men may shave face and neck.  Do not bring valuables to the hospital.  [facility] is not responsible for any belongings or valuables.  Contacts, dentures or bridgework may not be worn into surgery.  Leave your suitcase in the car.  After surgery it may be brought to your room.  For patients admitted to the hospital, discharge time will be determined by your treatment team.  Patients discharged the day of surgery will not be allowed to drive home and must have someone with them for 24 hours.    Special instructions:  DO NOT smoke tobacco or vape for 24 hours before your procedure.  Please read over the following fact sheets that you were given. Coughing and Deep Breathing, Surgical Site Infection Prevention, Anesthesia Post-op Instructions, and Care and Recovery After Surgery      Total or Modified Radical Mastectomy, Care After This sheet gives you information about how to care for yourself after your procedure. Your health care provider may also give you more specific instructions. If you have problems or questions, contact your health care provider. What can I expect after the procedure? After the procedure, it is common to have: Pain. Numbness. Stiffness in the arm or shoulder. Feelings of stress, sadness, or depression. If the lymph nodes under your arm were removed, you may have arm swelling, weakness, or numbness on the  same side of your body as your surgery. Follow these instructions at home: Incision care  Follow instructions from your health care provider about how to take care of your incision. Make sure you: Wash your hands with soap and water before you change your bandage (dressing). If soap and water are not available, use hand sanitizer. Change your dressing as told by your health care provider. Leave stitches (sutures), skin glue, or adhesive strips in place. These skin closures may need to stay in place for 2 weeks or longer. If adhesive strip edges start to loosen and curl up, you may trim the loose edges. Do not remove adhesive strips completely unless your health care provider tells you to do that. Check your incision area every day for signs of infection. Check for: Redness, swelling, or more pain. Fluid or blood. Warmth. Pus or a bad smell. If you were sent home with a surgical drain in place, follow instructions from your health care provider about emptying it. Bathing Do not take baths, swim, or use a hot tub until your health care provider approves. Ask your health care provider if you may take showers. You may only be allowed to  take sponge baths. Activity  Return to your normal activities as told by your health care provider. Ask your health care provider what activities are safe for you. Avoid activities that take a lot of effort. Be careful to avoid any activities that could cause an injury to your arm on the side of your surgery. Do not lift anything that is heavier than 10 lb (4.5 kg), or the limit that you are told, until your health care provider says that it is safe. Avoid lifting with the arm on the side of your surgery. Do not carry heavy objects on your shoulder. After your drain is removed, do exercises to prevent stiffness and swelling in your arm. Talk with your health care provider about which exercises are safe for you. General instructions Take over-the-counter and  prescription medicines only as told by your health care provider. You may eat what you usually do. Keep your arm raised (elevated) above the level of your heart when you are sitting or lying down. Do not wear tight jewelry on your arm, wrist, or fingers on the side of your surgery. You may be given a tight sleeve (compression bandage) to wear over your arm on the side of your surgery. Wear this sleeve as told by your health care provider. Ask your health care provider when you can start wearing a bra or using a breast prosthesis. Before you are involved in certain procedures such as giving blood or having your blood pressure checked, tell all your health care providers if lymph nodes under your arm were removed. This is important information. Follow-up Keep all follow-up visits as told by your health care provider. This is important. Get checked for extra fluid around your lymph nodes (lymphedema) as often as told by your health care provider. Contact a health care provider if: You have a fever. Your pain medicine is not working. Your arm swelling, weakness, or numbness has not improved after a few weeks. You have new swelling in your breast area or arm. You have redness, swelling, or more pain in your incision area. You have fluid or blood coming from your incision. Your incision feels warm to the touch. You have pus or a bad smell coming from your incision. Get help right away if: You have very bad pain in your breast area or arm. You have chest pain. You have difficulty breathing. Summary Follow instructions from your health care provider about how to take care of your incision. Check your incision area every day for signs of infection. Ask your health care provider what activities are safe for you. Keep all follow-up visits as told by your health care provider. This is important. Make sure you know which symptoms should cause you to contact your health care provider or to get help  right away. This information is not intended to replace advice given to you by your health care provider. Make sure you discuss any questions you have with your health care provider. Document Revised: 05/19/2020 Document Reviewed: 05/29/2020 Elsevier Patient Education  [place]. Sentinel Lymph Node Biopsy in Breast Cancer Treatment, Care After This sheet gives you information about how to care for yourself after your procedure. Your health care provider may also give you more specific instructions. If you have problems or questions, contact your health care provider. What can I expect after the procedure? After the procedure, it is common to have: Blue urine and darker stool for the next 24 hours. This is normal. It is caused by  the dye that was used during the procedure. Blue skin at the injection site. This may last for up to 8 weeks. Numbness, tingling, or pain near your incision. Swelling or bruising near your incision. Follow these instructions at home: Activity Avoid activities that take a lot of effort. Return to your normal activities as told by your health care provider. Ask your health care provider what activities are safe for you. Incision care  Follow instructions from your health care provider about how to take care of your incision. Make sure you: Wash your hands with soap and water for at least 20 seconds before and after you change your bandage (dressing). If soap and water are not available, use hand sanitizer. Change your dressing as told by your health care provider. Leave stitches (sutures), skin glue, or adhesive strips in place. These skin closures may need to stay in place for 2 weeks or longer. If adhesive strip edges start to loosen and curl up, you may trim the loose edges. Do not remove adhesive strips completely unless your health care provider tells you to do that. Do not take baths, swim, or use a hot tub until your health care provider approves. Ask  your health care provider if you can take showers. You may be able to shower 24 hours after your procedure. Check your biopsy site every day for signs of infection. Check for: More redness, swelling, or pain. Fluid or blood. Warmth. Pus or a bad smell. General instructions If you were given a surgical bra, wear it for the next 48 hours. You may remove the bra to shower. Take over-the-counter and prescription medicines only as told by your health care provider. You may resume your regular diet. Do not have your blood pressure taken or have blood drawn from the arm on the side of the biopsy until your health care provider says it is okay. You may need to be screened for extra fluid around the lymph nodes and swelling in the breast and arm (lymphedema). Follow instructions from your health care provider about how often you should be checked. Keep all follow-up visits as told by your health care provider. This is important. Contact a health care provider if you have: Nausea and vomiting. Any of these signs of infection: More redness, swelling, or pain around your biopsy site. Fluid or blood coming from your incision. Warmth coming from your incision area. Pus or a bad smell coming from your incision. Any new bruising. Chills or a fever. Get help right away if you have: Pain that is getting worse and your medicine is not helping. Vomiting that will not stop. Chest pain or trouble breathing. Summary After the procedure, it is common to have blue urine and darker stool for the next 24 hours. This is normal. It is caused by the dye that was used during the procedure. Follow instructions from your health care provider about how to take care of your incision. Do not have your blood pressure taken or have blood drawn from the arm on the side of the biopsy until your health care provider says it is okay. You may need to be screened for extra fluid around the lymph nodes and swelling in the breast  and arm (lymphedema). Follow instructions from your health care provider about how often you should be checked. This information is not intended to replace advice given to you by your health care provider. Make sure you discuss any questions you have with your health care provider. Document  Revised: 06/24/2019 Document Reviewed: 06/24/2019 Elsevier Patient Education  [place] Anesthesia, Adult, Care After This sheet gives you information about how to care for yourself after your procedure. Your health care provider may also give you more specific instructions. If you have problems or questions, contact your health care provider. What can I expect after the procedure? After the procedure, the following side effects are common: Pain or discomfort at the IV site. Nausea. Vomiting. Sore throat. Trouble concentrating. Feeling cold or chills. Feeling weak or tired. Sleepiness and fatigue. Soreness and body aches. These side effects can affect parts of the body that were not involved in surgery. Follow these instructions at home: For the time period you were told by your health care provider:  Rest. Do not participate in activities where you could fall or become injured. Do not drive or use machinery. Do not drink alcohol. Do not take sleeping pills or medicines that cause drowsiness. Do not make important decisions or sign legal documents. Do not take care of children on your own. Eating and drinking Follow any instructions from your health care provider about eating or drinking restrictions. When you feel hungry, start by eating small amounts of foods that are soft and easy to digest (bland), such as toast. Gradually return to your regular diet. Drink enough fluid to keep your urine pale yellow. If you vomit, rehydrate by drinking water, juice, or clear broth. General instructions If you have sleep apnea, surgery and certain medicines can increase your risk for  breathing problems. Follow instructions from your health care provider about wearing your sleep device: Anytime you are sleeping, including during daytime naps. While taking prescription pain medicines, sleeping medicines, or medicines that make you drowsy. Have a responsible adult stay with you for the time you are told. It is important to have someone help care for you until you are awake and alert. Return to your normal activities as told by your health care provider. Ask your health care provider what activities are safe for you. Take over-the-counter and prescription medicines only as told by your health care provider. If you smoke, do not smoke without supervision. Keep all follow-up visits as told by your health care provider. This is important. Contact a health care provider if: You have nausea or vomiting that does not get better with medicine. You cannot eat or drink without vomiting. You have pain that does not get better with medicine. You are unable to pass urine. You develop a skin rash. You have a fever. You have redness around your IV site that gets worse. Get help right away if: You have difficulty breathing. You have chest pain. You have blood in your urine or stool, or you vomit blood. Summary After the procedure, it is common to have a sore throat or nausea. It is also common to feel tired. Have a responsible adult stay with you for the time you are told. It is important to have someone help care for you until you are awake and alert. When you feel hungry, start by eating small amounts of foods that are soft and easy to digest (bland), such as toast. Gradually return to your regular diet. Drink enough fluid to keep your urine pale yellow. Return to your normal activities as told by your health care provider. Ask your health care provider what activities are safe for you. This information is not intended to replace advice given to you by your health care provider. Make  sure you discuss  any questions you have with your health care provider. Document Revised: 07/23/2020 Document Reviewed: 02/20/2020 Elsevier Patient Education  2022 [place]. How to Use Chlorhexidine for Bathing Chlorhexidine gluconate (CHG) is a germ-killing (antiseptic) solution that is used to clean the skin. It can get rid of the bacteria that normally live on the skin and can keep them away for about 24 hours. To clean your skin with CHG, you may be given: A CHG solution to use in the shower or as part of a sponge bath. A prepackaged cloth that contains CHG. Cleaning your skin with CHG may help lower the risk for infection: While you are staying in the intensive care unit of the hospital. If you have a vascular access, such as a central line, to provide short-term or long-term access to your veins. If you have a catheter to drain urine from your bladder. If you are on a ventilator. A ventilator is a machine that helps you breathe by moving air in and out of your lungs. After surgery. What are the risks? Risks of using CHG include: A skin reaction. Hearing loss, if CHG gets in your ears and you have a perforated eardrum. Eye injury, if CHG gets in your eyes and is not rinsed out. The CHG product catching fire. Make sure that you avoid smoking and flames after applying CHG to your skin. Do not use CHG: If you have a chlorhexidine allergy or have previously reacted to chlorhexidine. On babies younger than 45 months of age. How to use CHG solution Use CHG only as told by your health care provider, and follow the instructions on the label. Use the full amount of CHG as directed. Usually, this is one bottle. During a shower Follow these steps when using CHG solution during a shower (unless your health care provider gives you different instructions): Start the shower. Use your normal soap and shampoo to wash your face and hair. Turn off the shower or move out of the shower  stream. Pour the CHG onto a clean washcloth. Do not use any type of brush or rough-edged sponge. Starting at your neck, lather your body down to your toes. Make sure you follow these instructions: If you will be having surgery, pay special attention to the part of your body where you will be having surgery. Scrub this area for at least 1 minute. Do not use CHG on your head or face. If the solution gets into your ears or eyes, rinse them well with water. Avoid your genital area. Avoid any areas of skin that have broken skin, cuts, or scrapes. Scrub your back and under your arms. Make sure to wash skin folds. Let the lather sit on your skin for 1-2 minutes or as long as told by your health care provider. Thoroughly rinse your entire body in the shower. Make sure that all body creases and crevices are rinsed well. Dry off with a clean towel. Do not put any substances on your body afterward--such as powder, lotion, or perfume--unless you are told to do so by your health care provider. Only use lotions that are recommended by the manufacturer. Put on clean clothes or pajamas. If it is the night before your surgery, sleep in clean sheets.  During a sponge bath Follow these steps when using CHG solution during a sponge bath (unless your health care provider gives you different instructions): Use your normal soap and shampoo to wash your face and hair. Pour the CHG onto a clean washcloth. Starting  at your neck, lather your body down to your toes. Make sure you follow these instructions: If you will be having surgery, pay special attention to the part of your body where you will be having surgery. Scrub this area for at least 1 minute. Do not use CHG on your head or face. If the solution gets into your ears or eyes, rinse them well with water. Avoid your genital area. Avoid any areas of skin that have broken skin, cuts, or scrapes. Scrub your back and under your arms. Make sure to wash skin folds. Let  the lather sit on your skin for 1-2 minutes or as long as told by your health care provider. Using a different clean, wet washcloth, thoroughly rinse your entire body. Make sure that all body creases and crevices are rinsed well. Dry off with a clean towel. Do not put any substances on your body afterward--such as powder, lotion, or perfume--unless you are told to do so by your health care provider. Only use lotions that are recommended by the manufacturer. Put on clean clothes or pajamas. If it is the night before your surgery, sleep in clean sheets. How to use CHG prepackaged cloths Only use CHG cloths as told by your health care provider, and follow the instructions on the label. Use the CHG cloth on clean, dry skin. Do not use the CHG cloth on your head or face unless your health care provider tells you to. When washing with the CHG cloth: Avoid your genital area. Avoid any areas of skin that have broken skin, cuts, or scrapes. Before surgery Follow these steps when using a CHG cloth to clean before surgery (unless your health care provider gives you different instructions): Using the CHG cloth, vigorously scrub the part of your body where you will be having surgery. Scrub using a back-and-forth motion for 3 minutes. The area on your body should be completely wet with CHG when you are done scrubbing. Do not rinse. Discard the cloth and let the area air-dry. Do not put any substances on the area afterward, such as powder, lotion, or perfume. Put on clean clothes or pajamas. If it is the night before your surgery, sleep in clean sheets.  For general bathing Follow these steps when using CHG cloths for general bathing (unless your health care provider gives you different instructions). Use a separate CHG cloth for each area of your body. Make sure you wash between any folds of skin and between your fingers and toes. Wash your body in the following order, switching to a new cloth after each  step: The front of your neck, shoulders, and chest. Both of your arms, under your arms, and your hands. Your stomach and groin area, avoiding the genitals. Your right leg and foot. Your left leg and foot. The back of your neck, your back, and your buttocks. Do not rinse. Discard the cloth and let the area air-dry. Do not put any substances on your body afterward--such as powder, lotion, or perfume--unless you are told to do so by your health care provider. Only use lotions that are recommended by the manufacturer. Put on clean clothes or pajamas. Contact a health care provider if: Your skin gets irritated after scrubbing. You have questions about using your solution or cloth. You swallow any chlorhexidine. Call your local poison control center (1-240-299-1802 in the [place] Get help right away if: Your eyes itch badly, or they become very red or swollen. Your skin itches badly and is red or  swollen. Your hearing changes. You have trouble seeing. You have swelling or tingling in your mouth or throat. You have trouble breathing. These symptoms may represent a serious problem that is an emergency. Do not wait to see if the symptoms will go away. Get medical help right away. Call your local emergency services (911 in the [place] Do not drive yourself to the hospital. Summary Chlorhexidine gluconate (CHG) is a germ-killing (antiseptic) solution that is used to clean the skin. Cleaning your skin with CHG may help to lower your risk for infection. You may be given CHG to use for bathing. It may be in a bottle or in a prepackaged cloth to use on your skin. Carefully follow your health care provider's instructions and the instructions on the product label. Do not use CHG if you have a chlorhexidine allergy. Contact your health care provider if your skin gets irritated after scrubbing. This information is not intended to replace advice given to you by your health care provider. Make sure you discuss  any questions you have with your health care provider. Document Revised: 01/18/2021 Document Reviewed: 01/18/2021 Elsevier Patient Education  2022 [employer].

## 2021-10-24 NOTE — Progress Notes (Signed)
[facility] [address] [place] [address]   Patient Care Team: [name], DO as PCP - General (Family Medicine) Gala Romney Cristopher Estimable, MD as Consulting Physician (Gastroenterology) Brien Mates, RN as Oncology Nurse Navigator (Oncology) Derek Jack, MD as Medical Oncologist (Medical Oncology)  CHIEF COMPLAINTS/PURPOSE OF CONSULTATION:  Newly diagnosed bilateral breast cancer  HISTORY OF PRESENTING ILLNESS:  Elizabeth Wu 70 y.o. female is here because of recent diagnosis of bilateral breast cancer.   Today she reports feeling fair, and she is accompanied by her sister. She denies history of biopsies, and she denies personal history of cancer. She reports losing about 23 pounds over the past 6 months. She reports the last full meal she had eaten as 10/28 due to loss of appetite; she reports nausea and that food feels like it grows in her mouth. She also reports diarrhea. She reports abdominal pain when she coughs or sneezes starting several months ago. She also reports pain in her lower back and hips radiating down her legs which causes pain when standing starting several months ago; for this reason she presents in a wheelchair today. She is currently taking 2 ibuprofen every 4 hours for these pains. She denies hemoptysis and trouble swallowing. She denies history of CVA and MI. She reports a cyst on her back which is painful to the touch.   She currently lives at home with a friend. Her father had stomach cancer, and her brother had lung cancer. 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 She currently smokes 1/2 ppd and has smoked 50-60 years.   In terms of breast cancer risk profile:  She menarched at early age of 3-13 and went to menopause in her 7s or 41s She had 0 pregnancies She never received birth control pills.  She was never exposed to fertility medications or hormone replacement therapy.   She has family history of Breast/GYN/GI cancer  I reviewed her records extensively and collaborated the history with the patient.  SUMMARY OF ONCOLOGIC HISTORY: Oncology History   No history exists.    MEDICAL HISTORY:  Past Medical History:  Diagnosis Date   Allergy    DJD (degenerative joint disease)    Hyperlipidemia    Osteopenia     SURGICAL HISTORY: Past Surgical History:  Procedure Laterality Date   APPENDECTOMY     HERNIA REPAIR     TONSILLECTOMY     TONSILLECTOMY      SOCIAL HISTORY: Social History   Socioeconomic History   Marital status: Single    Spouse name: Not on file   Number of children: Not on file   Years of education: Not on file   Highest education level: 12th grade  Occupational History   Occupation: Quality Control  Tobacco Use   Smoking status: Every Day    Packs/day: 0.50    Years: 40.00    Pack years: 20.00    Types: Cigarettes   Smokeless tobacco: Never  Vaping Use   Vaping Use: Never used  Substance and Sexual Activity   Alcohol use: No   Drug use: No   Sexual activity: Not on file  Other Topics Concern   Not on file  Social History Narrative   Not on file   Social Determinants of Health   Financial Resource Strain: Not on file  Food Insecurity: Not on file  Transportation Needs: Not on file  Physical Activity: Not on file  Stress: Not  on file  Social Connections: Not on file  Intimate Partner Violence: Not on file    FAMILY HISTORY: Family History  Problem Relation Age of Onset   Alzheimer's disease Mother    Cancer Father        stomach cancer   Diabetes Sister    Heart disease Brother        MI    ALLERGIES:  has No Known Allergies.  MEDICATIONS:  Current Outpatient Medications  Medication Sig Dispense Refill   Cholecalciferol (VITAMIN D3) 50 MCG (2000 UT) CAPS Take 2,000 Units by mouth daily.     cyanocobalamin (,VITAMIN B-12,) 1000 MCG/ML injection Inject 1,000 mcg into the muscle every 30 (thirty)  days.     Ensure (ENSURE) Take 237 mLs by mouth 2 (two) times daily between meals.     folic acid (FOLVITE) 1 MG tablet Take 1 mg by mouth daily.     loperamide (IMODIUM A-D) 2 MG tablet Take 2 mg by mouth 4 (four) times daily as needed for diarrhea or loose stools.     losartan (COZAAR) 50 MG tablet Take 1 tablet (50 mg total) by mouth daily. 90 tablet 3   Omega-3 Fatty Acids (FISH OIL) 1000 MG CAPS Take 1,000 mg by mouth daily.     simvastatin (ZOCOR) 20 MG tablet Take 1 tablet (20 mg total) by mouth at bedtime. 90 tablet 3   sodium chloride (OCEAN) 0.65 % SOLN nasal spray Place 1 spray into both nostrils as needed for congestion. 60 mL 1   ibuprofen (ADVIL) 600 MG tablet Take 1 tablet (600 mg total) by mouth 2 (two) times daily as needed for moderate pain. (Patient not taking: Reported on 10/25/2021) 90 tablet 1   No current facility-administered medications for this visit.    REVIEW OF SYSTEMS:   Review of Systems  Constitutional:  Positive for appetite change (no appetite) and fatigue (25%).  Respiratory:  Positive for cough. Negative for hemoptysis.   Cardiovascular:  Positive for chest pain (5/10 breast).  Gastrointestinal:  Positive for abdominal pain, diarrhea and nausea.  Musculoskeletal:  Positive for arthralgias (5/10 legs).  Psychiatric/Behavioral:  Positive for sleep disturbance.   All other systems reviewed and are negative.  PHYSICAL EXAMINATION: ECOG PERFORMANCE STATUS: 1 - Symptomatic but completely ambulatory  Vitals:   10/25/21 0809  BP: (!) 152/86  Pulse: (!) 105  Resp: 17  Temp: (!) 97 F (36.1 C)  SpO2: 95%   Filed Weights   10/25/21 0809  Weight: 137 lb 8 oz (62.4 kg)   Physical Exam Vitals reviewed.  Constitutional:      Appearance: Normal appearance.     Comments: In wheelchair  Cardiovascular:     Rate and Rhythm: Normal rate and regular rhythm.     Pulses: Normal pulses.     Heart sounds: Normal heart sounds.  Pulmonary:     Effort:  Pulmonary effort is normal.     Breath sounds: Normal breath sounds.  Chest:  Breasts:    Left: Mass (2 cm UOQ mass; 1 cm LIQ mass) present.  Abdominal:     Tenderness: There is abdominal tenderness in the left lower quadrant.  Musculoskeletal:     Right lower leg: No edema.     Left lower leg: No edema.  Neurological:     General: No focal deficit present.     Mental Status: She is alert and oriented to person, place, and time.  Psychiatric:  Mood and Affect: Mood normal.        Behavior: Behavior normal.    Breast Exam Chaperone: Thana Ates    LABORATORY DATA:  I have reviewed the data as listed   RADIOGRAPHIC STUDIES: I have personally reviewed the radiological reports and agreed with the findings in the report. NM PET Image Initial (PI) Skull Base To Thigh (F-18 FDG)  Result Date: 10/22/2021 CLINICAL DATA:  Initial treatment strategy for breast cancer. EXAM: NUCLEAR MEDICINE PET SKULL BASE TO THIGH TECHNIQUE: 8.0 mCi F-18 FDG was injected intravenously. Full-ring PET imaging was performed from the skull base to thigh after the radiotracer. CT data was obtained and used for attenuation correction and anatomic localization. Fasting blood glucose: 134 mg/dl COMPARISON:  CT abdomen pelvis 03/26/2010. FINDINGS: Mediastinal blood pool activity: SUV max 2.3 Liver activity: SUV max NA NECK: No abnormal hypermetabolism. Incidental CT findings: None. CHEST: Spiculated mass in the superior segment right lower lobe measures 2.3 x 3.4 cm (7/101) with an SUV max 8.1. Hypermetabolic right middle lobe nodule measures 9 mm (7/132) with an SUV max 2.7. No hypermetabolic mediastinal, hilar or axillary lymph nodes. A 1.5 x 2.1 cm nodule in the upper outer left breast has an SUV max 2.8. 12 mm nodule in the supra-areolar medial left breast has an SUV max 3.1. Subcutaneous soft tissue nodule overlying the posterior lower left ribs measures 1.4 cm (3/110) with an SUV max 2.8. Incidental CT  findings: Atherosclerotic calcification of the aorta, aortic valve and coronary arteries. There may be a calcified ductus diverticulum along the inferior margin of the aortic arch. Heart size normal. No pericardial or pleural effusion. Extensive perilymphatic nodularity throughout the lungs, in addition to the nodules and masses described above. ABDOMEN/PELVIS: No abnormal hypermetabolism in the liver, adrenal glands, spleen or pancreas. 10 mm left omental nodule (3/145), SUV max 2.3. Soft tissue nodule in left pericolic mesentery measures 7 mm (3/180) with an SUV max 1.8. Soft tissue nodule posterior to the left psoas muscle measures 9 mm (3/195) with an SUV max 2.1. Incidental CT findings: Subcentimeter low-attenuation lesion in the left hepatic lobe, too small to characterize. Liver margin is irregular. Stone in the gallbladder. Adrenal glands and right kidney are unremarkable. Punctate stone in the left kidney. Spleen, pancreas, stomach and bowel are unremarkable. Tiny nodule posterior to the upper pole right kidney measures approximately 3 mm, too small for PET resolution but likely metastatic. Additional scattered small subcutaneous nodules, too small for PET resolution but also likely metastatic. Atherosclerotic calcification of the aorta. SKELETON: No abnormal hypermetabolism. Incidental CT findings: Degenerative changes in the spine.  Osteopenia. IMPRESSION: 1. Metastatic breast cancer as evidenced by hypermetabolic nodules in the left breast, pulmonary lymphangitic carcinomatosis, peritoneal carcinomatosis and hypermetabolic subcutaneous soft tissue nodules. 2. Cirrhosis. 3. Cholelithiasis. 4. Left renal stone. 5. Aortic atherosclerosis (ICD10-I70.0). Coronary artery calcification. Electronically Signed   By: Lorin Picket M.D.   On: 10/22/2021 12:07     ASSESSMENT:  Bilateral breast cancer: - Screening mammogram on 08/24/2021 with suspicious 0.9 cm mass in the left breast at 10 o'clock position, 1.3  cm mass at the 12 o'clock position.  Suspicious 0.7 cm mass in the right breast at 10 o'clock position. - Biopsy of the left breast 12:00 and 10 o'clock position consistent with invasive ductal carcinoma, grade 3.  Tumor cells are negative for HER2.  ER is 10% positive with weak staining.  PR is 5% positive with moderate staining.  Ki-67 is 15%.  Tissue  for this case was left in formalin for 72 hours, therefore repeat prognostic panel was recommended on excision specimen. - Biopsy of the right breast 10 o'clock position shows invasive lobular carcinoma, ER 60% positive, PR 80% positive, Ki-67 2%, HER2 2+.  HER2 FISH pending. - PET scan on 10/21/2021 with a right lower lobe lung spiculated mass measuring 2.3 x 3.4 cm with SUV 8.1.  Hypermetabolic right middle lobe lung nodule measuring 9 mm with SUV 2.7.  1.5 x 2.1 cm left breast upper outer nodule SUV 2.8.  12 mm nodule in the supra-areolar medial left breast SUV 3.1.  Subcutaneous soft tissue nodule overlying the posterior lower left ribs measuring 1.4 cm with SUV 2.8.  10 mm left omental nodule.  Soft tissue nodule in the left pericolic mesentery measures 7 mm with SUV 1.8.  Soft tissue nodule posterior to the left psoas muscle measures 9 mm with SUV 2.1. - MRI of the lumbar spine on 09/14/2021 with 1 cm foci of low T1 and high T2 signal within L5 and S1 vertebral bodies, nonspecific, could be marrow space metastatic disease. - 25 pound weight loss since the beginning of this year due to decreased appetite and eating. - Lower back and right hip pain since February/March of this year on and off.   Social/family history: - She lives at home with her friend.  She is retired and worked in Architect, as a Artist, and Corporate investment banker.  She is current active smoker, half to 1 pack/day for 50 to 60 years. - Father had stomach cancer.  Brother had lung cancer.   PLAN:  Bilateral breast cancer: - We have reviewed pathology report in  detail.  She has a left breast invasive ductal carcinoma (ER 10% weak staining, PR 5% moderate staining, Ki-67 15%, HER2 negative) and right breast invasive lobular carcinoma (ER/PR positive, HER2 2+, FISH pending).  Unfortunately tissue was left in formalin for 72 hours, repeat prognostic panel was recommended. - We have reviewed images of the PET scan with the patient and her sister in detail. - Recommend biopsy of the right lung mass to differentiate between primary lung cancer versus metastatic breast cancer.  If it is found to be lung cancer, we may also need to biopsy of the omental lesion for staging.  If it is found to be breast cancer, will repeat ER/PR and HER2. - Recommend holding off on mastectomy which is scheduled on Wednesday. - RTC after biopsy.  2.  Low back pain and right hip pain: - Patient is in a lot of pain and is taking a ibuprofen 2 tablets every 4 hours which is not helping. - We will start her on hydrocodone 5/325 every 6 hours.  She was recommended to start taking stool softeners.  3.  Weight loss/loss of appetite: - We will start her on Marinol 2.5 mg twice daily and titrate up as needed. - We will also start her on Compazine 10 mg every 6 hours as needed for nausea.    Derek Jack, MD 10/25/21 8:46 AM  [facility] [phone]   I, Thana Ates, am acting as a scribe for Dr. Derek Jack.  I, Derek Jack MD, have reviewed the above documentation for accuracy and completeness, and I agree with the above.

## 2021-10-25 ENCOUNTER — Other Ambulatory Visit (HOSPITAL_COMMUNITY): Payer: Medicare HMO

## 2021-10-25 ENCOUNTER — Other Ambulatory Visit: Payer: Self-pay

## 2021-10-25 ENCOUNTER — Encounter (HOSPITAL_COMMUNITY): Payer: Self-pay | Admitting: Hematology

## 2021-10-25 ENCOUNTER — Encounter (HOSPITAL_COMMUNITY)
Admission: RE | Admit: 2021-10-25 | Discharge: 2021-10-25 | Disposition: A | Discharge status: Home / Self Care | Payer: Medicare HMO | Source: Ambulatory Visit | Attending: General Surgery | Admitting: General Surgery

## 2021-10-25 ENCOUNTER — Encounter (HOSPITAL_COMMUNITY): Payer: Self-pay

## 2021-10-25 ENCOUNTER — Other Ambulatory Visit (HOSPITAL_COMMUNITY): Payer: Self-pay

## 2021-10-25 ENCOUNTER — Inpatient Hospital Stay (HOSPITAL_COMMUNITY): Payer: Medicare HMO | Attending: Hematology | Admitting: Hematology

## 2021-10-25 VITALS — BP 152/86 | HR 105 | Temp 97.0°F | Resp 17 | Ht 64.0 in | Wt 137.5 lb

## 2021-10-25 DIAGNOSIS — R5383 Other fatigue: Secondary | ICD-10-CM | POA: Insufficient documentation

## 2021-10-25 DIAGNOSIS — C50411 Malignant neoplasm of upper-outer quadrant of right female breast: Secondary | ICD-10-CM | POA: Diagnosis not present

## 2021-10-25 DIAGNOSIS — Z800 Family history of malignant neoplasm of digestive organs: Secondary | ICD-10-CM | POA: Insufficient documentation

## 2021-10-25 DIAGNOSIS — R630 Anorexia: Secondary | ICD-10-CM | POA: Insufficient documentation

## 2021-10-25 DIAGNOSIS — C786 Secondary malignant neoplasm of retroperitoneum and peritoneum: Secondary | ICD-10-CM | POA: Diagnosis not present

## 2021-10-25 DIAGNOSIS — R110 Nausea: Secondary | ICD-10-CM | POA: Insufficient documentation

## 2021-10-25 DIAGNOSIS — F17210 Nicotine dependence, cigarettes, uncomplicated: Secondary | ICD-10-CM | POA: Diagnosis not present

## 2021-10-25 DIAGNOSIS — Z170 Estrogen receptor positive status [ER+]: Secondary | ICD-10-CM | POA: Diagnosis not present

## 2021-10-25 DIAGNOSIS — C50212 Malignant neoplasm of upper-inner quadrant of left female breast: Secondary | ICD-10-CM

## 2021-10-25 DIAGNOSIS — Z79891 Long term (current) use of opiate analgesic: Secondary | ICD-10-CM | POA: Diagnosis not present

## 2021-10-25 DIAGNOSIS — R059 Cough, unspecified: Secondary | ICD-10-CM | POA: Insufficient documentation

## 2021-10-25 DIAGNOSIS — C7800 Secondary malignant neoplasm of unspecified lung: Secondary | ICD-10-CM | POA: Diagnosis not present

## 2021-10-25 DIAGNOSIS — M1990 Unspecified osteoarthritis, unspecified site: Secondary | ICD-10-CM | POA: Diagnosis not present

## 2021-10-25 DIAGNOSIS — R634 Abnormal weight loss: Secondary | ICD-10-CM | POA: Diagnosis not present

## 2021-10-25 DIAGNOSIS — C771 Secondary and unspecified malignant neoplasm of intrathoracic lymph nodes: Secondary | ICD-10-CM | POA: Diagnosis not present

## 2021-10-25 DIAGNOSIS — C50412 Malignant neoplasm of upper-outer quadrant of left female breast: Secondary | ICD-10-CM | POA: Diagnosis not present

## 2021-10-25 DIAGNOSIS — E785 Hyperlipidemia, unspecified: Secondary | ICD-10-CM | POA: Insufficient documentation

## 2021-10-25 DIAGNOSIS — E7800 Pure hypercholesterolemia, unspecified: Secondary | ICD-10-CM | POA: Insufficient documentation

## 2021-10-25 DIAGNOSIS — Z780 Asymptomatic menopausal state: Secondary | ICD-10-CM | POA: Diagnosis not present

## 2021-10-25 DIAGNOSIS — Z8673 Personal history of transient ischemic attack (TIA), and cerebral infarction without residual deficits: Secondary | ICD-10-CM | POA: Diagnosis not present

## 2021-10-25 DIAGNOSIS — G893 Neoplasm related pain (acute) (chronic): Secondary | ICD-10-CM | POA: Diagnosis not present

## 2021-10-25 DIAGNOSIS — M8580 Other specified disorders of bone density and structure, unspecified site: Secondary | ICD-10-CM | POA: Diagnosis not present

## 2021-10-25 DIAGNOSIS — R197 Diarrhea, unspecified: Secondary | ICD-10-CM | POA: Diagnosis not present

## 2021-10-25 DIAGNOSIS — Z8639 Personal history of other endocrine, nutritional and metabolic disease: Secondary | ICD-10-CM

## 2021-10-25 DIAGNOSIS — I252 Old myocardial infarction: Secondary | ICD-10-CM | POA: Insufficient documentation

## 2021-10-25 DIAGNOSIS — R109 Unspecified abdominal pain: Secondary | ICD-10-CM | POA: Insufficient documentation

## 2021-10-25 DIAGNOSIS — Z01818 Encounter for other preprocedural examination: Secondary | ICD-10-CM

## 2021-10-25 DIAGNOSIS — Z803 Family history of malignant neoplasm of breast: Secondary | ICD-10-CM | POA: Insufficient documentation

## 2021-10-25 DIAGNOSIS — Z801 Family history of malignant neoplasm of trachea, bronchus and lung: Secondary | ICD-10-CM | POA: Diagnosis not present

## 2021-10-25 DIAGNOSIS — Z79899 Other long term (current) drug therapy: Secondary | ICD-10-CM | POA: Insufficient documentation

## 2021-10-25 DIAGNOSIS — Z8041 Family history of malignant neoplasm of ovary: Secondary | ICD-10-CM | POA: Insufficient documentation

## 2021-10-25 MED ORDER — DRONABINOL 2.5 MG PO CAPS: 2.5000 mg | ORAL_CAPSULE | Freq: Two times a day (BID) | ORAL | 0 refills | Status: DC

## 2021-10-25 MED ORDER — HYDROCODONE-ACETAMINOPHEN 5-325 MG PO TABS: 1.0000 | ORAL_TABLET | Freq: Four times a day (QID) | ORAL | 0 refills | Status: DC | PRN

## 2021-10-25 MED ORDER — PROCHLORPERAZINE MALEATE 10 MG PO TABS: 10.0000 mg | ORAL_TABLET | Freq: Four times a day (QID) | ORAL | 0 refills | Status: DC | PRN

## 2021-10-25 NOTE — Patient Instructions (Addendum)
[place] at [facility] Discharge Instructions  You were seen and examined today by Dr. Delton Coombes. Dr. Delton Coombes is a medical oncologist, meaning that he specializes in the management of cancer diagnoses with medications. Dr. Delton Coombes discussed your past medical history, family history of cancer, and the events that led to you being here today.  Your recent mammogram and biopsy revealed a type of breast cancer. Your recent PET scan revealed that it has spread outside of your breast. The largest nodule that was noted was actually in your lung. It is unclear if this is related to breast cancer or if it is primary lung cancer.  Dr. Delton Coombes has recommended a Lung Biopsy. It is not recommended that you proceed with surgery on Wednesday until further identification is occurred.  Dr. Delton Coombes has prescribed Hydrocodone-Acetaminophen 5-325mg  to be taken every 6 hours as needed for pain, Compazine 10mg  every 6 hours as needed for nausea, and Marinol 2.5mg  twice daily to stimulate appetite.  Follow-up as scheduled.   Thank you for choosing [place] at [facility] to provide your oncology and hematology care.  To afford each patient quality time with our provider, please arrive at least 15 minutes before your scheduled appointment time.   If you have a lab appointment with the [facility] please come in thru the Main Entrance and check in at the main information desk.  You need to re-schedule your appointment should you arrive 10 or more minutes late.  We strive to give you quality time with our providers, and arriving late affects you and other patients whose appointments are after yours.  Also, if you no show three or more times for appointments you may be dismissed from the clinic at the providers discretion.     Again, thank you for choosing [facility].  Our hope is that these requests will decrease the amount of time that you  wait before being seen by our physicians.       _____________________________________________________________  Should you have questions after your visit to [facility], please contact our office at [phone] and follow the prompts.  Our office hours are 8:00 a.m. and 4:30 p.m. Monday - Friday.  Please note that voicemails left after 4:00 p.m. may not be returned until the following business day.  We are closed weekends and major holidays.  You do have access to a nurse 24-7, just call the main number to the clinic [phone] and do not press any options, hold on the line and a nurse will answer the phone.    For prescription refill requests, have your pharmacy contact our office and allow 72 hours.    Due to Covid, you will need to wear a mask upon entering the hospital. If you do not have a mask, a mask will be given to you at the Main Entrance upon arrival. For doctor visits, patients may have 1 support person age 54 or older with them. For treatment visits, patients can not have anyone with them due to social distancing guidelines and our immunocompromised population.

## 2021-10-26 ENCOUNTER — Encounter (HOSPITAL_COMMUNITY): Payer: Self-pay

## 2021-10-26 NOTE — Progress Notes (Signed)
I met with the patient and her sister during and following initial visit with Dr. Delton Coombes. I introduced myself and explained my role in the patient's care. I provided my contact information and encouraged the patient to call with questions or concerns.

## 2021-10-27 ENCOUNTER — Ambulatory Visit: Admit: 2021-10-27 | Payer: Medicare HMO | Admitting: General Surgery

## 2021-10-27 ENCOUNTER — Encounter (HOSPITAL_COMMUNITY): Payer: Self-pay

## 2021-10-27 ENCOUNTER — Other Ambulatory Visit (HOSPITAL_COMMUNITY): Payer: Medicare HMO

## 2021-10-27 SURGERY — MASTECTOMY WITH SENTINEL LYMPH NODE BIOPSY
Anesthesia: General | Laterality: Bilateral

## 2021-10-27 NOTE — Progress Notes (Signed)
Cinde, Ebert Legal Sex  Female DOB  23-May-1951 SSN  [id number] Address  [place] [address] Phone  [phone] (Home) *Preferred*    RE: CT Biopsy Received: Today Aletta Edouard, MD  Criselda Peaches, MD; Valli Glance; P Ir Procedure Requests Change to US guided biopsy of subcutaneous nodule of back seen by PET. I spoke w/ Dr. Raliegh Ip. He is going to consult Pulmonary first about the lung lesion.   GY        Previous Messages   ----- Message -----  From: Criselda Peaches, MD  Sent: 10/27/2021  10:28 AM EST  To: Valli Glance, Ir Procedure Requests  Subject: RE: CT Biopsy                                   Approved for CT guided bx.  Hx of both breast and lung cancer, need to differentiate.   HKM    ----- Message -----  From: Valli Glance  Sent: 10/25/2021   6:18 PM EST  To: Ir Procedure Requests  Subject: CT Biopsy                                       CT Biopsy      Reason: RLL Lung Mass, known breast cancer, Lung cancer also       History: PET and MRI, [place] in [place], [place]

## 2021-10-28 ENCOUNTER — Ambulatory Visit: Payer: Medicare HMO | Admitting: Family Medicine

## 2021-11-04 LAB — SURGICAL PATHOLOGY

## 2021-11-05 ENCOUNTER — Encounter: Payer: Self-pay | Admitting: Emergency Medicine

## 2021-11-05 ENCOUNTER — Telehealth: Payer: Self-pay | Admitting: Emergency Medicine

## 2021-11-05 ENCOUNTER — Other Ambulatory Visit: Payer: Self-pay

## 2021-11-05 ENCOUNTER — Ambulatory Visit: Payer: Medicare HMO | Admitting: Emergency Medicine

## 2021-11-05 VITALS — BP 114/68 | HR 103 | Temp 98.3°F | Ht 64.0 in | Wt 133.0 lb

## 2021-11-05 DIAGNOSIS — R918 Other nonspecific abnormal finding of lung field: Secondary | ICD-10-CM

## 2021-11-05 NOTE — H&P (View-Only) (Signed)
Subjective:    Patient ID: Elizabeth Wu, female    DOB: February 10, 1951, 70 y.o.   MRN: [id number]  HPI Pleasant 70 year old woman, active smoker (30 pack years) with a history of allergic rhinitis, hyperlipidemia.  She is newly diagnosed with bilateral breast cancer in October 2022.  Confirmed by biopsy.  Subsequent PET scan identified hypermetabolic lung findings including a 9 mm right middle lobe nodule, 2.3 x 3.4 right lower lobe spiculated mass.  She is here today to discuss the PET scan findings  PET scan 10/21/2021 reviewed by me shows spiculated mass with some eccentric cavitation 2.3 x 3.4 cm that is hypermetabolic, a hypermetabolic 9 mm right middle lobe nodule.  1.5 x 2.1 cm left breast nodule.  12 mm left breast nodule.  Evidence for peritoneal involvement   Review of Systems As per HPI  Past Medical History:  Diagnosis Date   Allergy    DJD (degenerative joint disease)    Hyperlipidemia    Osteopenia      Family History  Problem Relation Age of Onset   Alzheimer's disease Mother    Cancer Father        stomach cancer   Diabetes Sister    Heart disease Brother        MI     Social History   Socioeconomic History   Marital status: Single    Spouse name: Not on file   Number of children: Not on file   Years of education: Not on file   Highest education level: 12th Wu  Occupational History   Occupation: Quality Control  Tobacco Use   Smoking status: Every Day    Packs/day: 0.50    Years: 40.00    Pack years: 20.00    Types: Cigarettes   Smokeless tobacco: Never   Tobacco comments:    2 cigarettes smoked daily ARJ 11/05/21  Vaping Use   Vaping Use: Never used  Substance and Sexual Activity   Alcohol use: No   Drug use: No   Sexual activity: Not on file  Other Topics Concern   Not on file  Social History Narrative   Not on file   Social Determinants of Health   Financial Resource Strain: Not on file  Food Insecurity: Not on file  Transportation  Needs: Not on file  Physical Activity: Not on file  Stress: Not on file  Social Connections: Not on file  Intimate Partner Violence: Not on file    Has lived locally Worked construction, quality control No other exposures.   No Known Allergies   Outpatient Medications Prior to Visit  Medication Sig Dispense Refill   cholecalciferol (VITAMIN D3) 25 MCG (1000 UNIT) tablet Take 1,000 Units by mouth daily.     cyanocobalamin (,VITAMIN B-12,) 1000 MCG/ML injection Inject 1,000 mcg into the muscle every 30 (thirty) days.     dronabinol (MARINOL) 2.5 MG capsule Take 1 capsule (2.5 mg total) by mouth 2 (two) times daily before a meal. 60 capsule 0   Ensure (ENSURE) Take 237 mLs by mouth 2 (two) times daily between meals.     folic acid (FOLVITE) 1 MG tablet Take 1 mg by mouth daily.     HYDROcodone-acetaminophen (NORCO/VICODIN) 5-325 MG tablet Take 1 tablet by mouth every 6 (six) hours as needed for moderate pain. 60 tablet 0   ibuprofen (ADVIL) 200 MG tablet Take 400 mg by mouth every 6 (six) hours as needed for moderate pain.     ibuprofen (ADVIL) 600  MG tablet Take 1 tablet (600 mg total) by mouth 2 (two) times daily as needed for moderate pain. 90 tablet 1   loperamide (IMODIUM A-D) 2 MG tablet Take 2 mg by mouth 4 (four) times daily as needed for diarrhea or loose stools.     losartan (COZAAR) 50 MG tablet Take 1 tablet (50 mg total) by mouth daily. 90 tablet 3   Omega-3 Fatty Acids (FISH OIL) 1000 MG CAPS Take 1,000 mg by mouth daily.     prochlorperazine (COMPAZINE) 10 MG tablet Take 1 tablet (10 mg total) by mouth every 6 (six) hours as needed for nausea or vomiting. 30 tablet 0   simvastatin (ZOCOR) 20 MG tablet Take 1 tablet (20 mg total) by mouth at bedtime. 90 tablet 3   No facility-administered medications prior to visit.         Objective:   Physical Exam Vitals:   11/05/21 0940  BP: 114/68  Pulse: (!) 103  Temp: 98.3 F (36.8 C)  TempSrc: Oral  SpO2: 95%  Weight:  133 lb (60.3 kg)  Height: 5\' 4"  (1.626 m)    Gen: Pleasant, elderly woman, in no distress,  normal affect  ENT: No lesions,  mouth clear,  edentulous, oropharynx clear, no postnasal drip  Neck: No JVD, no stridor  Lungs: No use of accessory muscles, distant, no crackles or wheezing on normal respiration, no wheeze on forced expiration  Cardiovascular: RRR, heart sounds normal, no murmur or gallops, no peripheral edema  Musculoskeletal: No deformities, no cyanosis or clubbing  Neuro: alert, awake, non focal  Skin: Warm, no lesions or rash      Assessment & Plan:  Pulmonary nodules Noted on PET scan 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  Need to differentiate between primary lung cancer and metastatic breast cancer.  Explained the rationale to her today.  We will work on setting up navigational bronchoscopy, try to sample the right lower lobe and right middle lobe abnormalities.  I will try to get this done between 12/21 and 12/23.  She will need a super D CT to plan the case and we will try to get  this arranged ASAP.  We will work on setting up bronchoscopy to evaluate your right lung nodules.  This would be done as an outpatient under general anesthesia at [facility] endoscopy.  You will need a designated driver.  We will try to get this scheduled between 12/21 and 12/23. We will arrange for a CT scan of your chest to be used during the procedure We will work on helping you decrease your smoking as we go forward. Follow Dr. Lamonte Sakai in 1 month or next available.    Baltazar Apo, MD, PhD 11/05/2021, 10:11 AM [facility] [phone] or if no answer before 7:00PM call [phone] For any issues after 7:00PM please call eLink [phone]

## 2021-11-05 NOTE — Patient Instructions (Signed)
We will work on setting up bronchoscopy to evaluate your right lung nodules.  This would be done as an outpatient under general anesthesia at [facility] endoscopy.  You will need a designated driver.  We will try to get this scheduled between 12/21 and 12/23. We will arrange for a CT scan of your chest to be used during the procedure We will work on helping you decrease your smoking as we go forward. Follow Dr. Lamonte Sakai in 1 month or next available.

## 2021-11-05 NOTE — Telephone Encounter (Signed)
I scheduled pt for 12/22 at 7:30.  She will go for covid test on 12/19 and will get CT at [facility] on 12/19.  I called [facility] and spoke to [facility] in radiology and asked for disc to be sent to [facility] Endo.  I spoke to pt & gave her appt info.

## 2021-11-05 NOTE — Assessment & Plan Note (Signed)
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  Need to differentiate between primary lung cancer and metastatic breast cancer.  Explained the rationale to her today.  We will work on setting up navigational bronchoscopy, try to sample the right lower lobe and right middle lobe abnormalities.  I will try to get this done between 12/21 and 12/23.  She will need a super D CT to plan the case and we will try to get  this arranged ASAP.  We will work on setting up bronchoscopy to evaluate your right lung nodules.  This would be done as an outpatient under general anesthesia at [facility] endoscopy.  You will need a designated driver.  We will try to get this scheduled between 12/21 and 12/23. We will arrange for a CT scan of your chest to be used during the procedure We will work on helping you decrease your smoking as we go forward. Follow Dr. Lamonte Sakai in 1 month or next available.

## 2021-11-05 NOTE — Progress Notes (Signed)
Subjective:    Patient ID: Elizabeth Wu, female    DOB: 07-May-1951, 70 y.o.   MRN: [id number]  HPI Elizabeth Wu 69 year old woman, active smoker (30 pack years) with a history of allergic rhinitis, hyperlipidemia.  She is newly diagnosed with bilateral breast cancer in October 2022.  Confirmed by biopsy.  Subsequent PET scan identified hypermetabolic lung findings including a 9 mm right middle lobe nodule, 2.3 x 3.4 right lower lobe spiculated mass.  She is here today to discuss the PET scan findings  PET scan 10/21/2021 reviewed by me shows spiculated mass with some eccentric cavitation 2.3 x 3.4 cm that is hypermetabolic, a hypermetabolic 9 mm right middle lobe nodule.  1.5 x 2.1 cm left breast nodule.  12 mm left breast nodule.  Evidence for peritoneal involvement   Review of Systems As per HPI  Past Medical History:  Diagnosis Date   Allergy    DJD (degenerative joint disease)    Hyperlipidemia    Osteopenia      Family History  Problem Relation Age of Onset   Alzheimer's disease Mother    Cancer Father        stomach cancer   Diabetes Sister    Heart disease Brother        MI     Social History   Socioeconomic History   Marital status: Single    Spouse name: Not on file   Number of children: Not on file   Years of education: Not on file   Highest education level: 12th grade  Occupational History   Occupation: Quality Control  Tobacco Use   Smoking status: Every Day    Packs/day: 0.50    Years: 40.00    Pack years: 20.00    Types: Cigarettes   Smokeless tobacco: Never   Tobacco comments:    2 cigarettes smoked daily [facility] 11/05/21  Vaping Use   Vaping Use: Never used  Substance and Sexual Activity   Alcohol use: No   Drug use: No   Sexual activity: Not on file  Other Topics Concern   Not on file  Social History Narrative   Not on file   Social Determinants of Health   Financial Resource Strain: Not on file  Food Insecurity: Not on file  Transportation  Needs: Not on file  Physical Activity: Not on file  Stress: Not on file  Social Connections: Not on file  Intimate Partner Violence: Not on file    Has lived locally Worked construction, quality control No other exposures.   No Known Allergies   Outpatient Medications Prior to Visit  Medication Sig Dispense Refill   cholecalciferol (VITAMIN D3) 25 MCG (1000 UNIT) tablet Take 1,000 Units by mouth daily.     cyanocobalamin (,VITAMIN B-12,) 1000 MCG/ML injection Inject 1,000 mcg into the muscle every 30 (thirty) days.     dronabinol (MARINOL) 2.5 MG capsule Take 1 capsule (2.5 mg total) by mouth 2 (two) times daily before a meal. 60 capsule 0   Ensure (ENSURE) Take 237 mLs by mouth 2 (two) times daily between meals.     folic acid (FOLVITE) 1 MG tablet Take 1 mg by mouth daily.     HYDROcodone-acetaminophen (NORCO/VICODIN) 5-325 MG tablet Take 1 tablet by mouth every 6 (six) hours as needed for moderate pain. 60 tablet 0   ibuprofen (ADVIL) 200 MG tablet Take 400 mg by mouth every 6 (six) hours as needed for moderate pain.     ibuprofen (ADVIL) 600  MG tablet Take 1 tablet (600 mg total) by mouth 2 (two) times daily as needed for moderate pain. 90 tablet 1   loperamide (IMODIUM A-D) 2 MG tablet Take 2 mg by mouth 4 (four) times daily as needed for diarrhea or loose stools.     losartan (COZAAR) 50 MG tablet Take 1 tablet (50 mg total) by mouth daily. 90 tablet 3   Omega-3 Fatty Acids (FISH OIL) 1000 MG CAPS Take 1,000 mg by mouth daily.     prochlorperazine (COMPAZINE) 10 MG tablet Take 1 tablet (10 mg total) by mouth every 6 (six) hours as needed for nausea or vomiting. 30 tablet 0   simvastatin (ZOCOR) 20 MG tablet Take 1 tablet (20 mg total) by mouth at bedtime. 90 tablet 3   No facility-administered medications prior to visit.         Objective:   Physical Exam Vitals:   11/05/21 0940  BP: 114/68  Pulse: (!) 103  Temp: 98.3 F (36.8 C)  TempSrc: Oral  SpO2: 95%  Weight:  133 lb (60.3 kg)  Height: 5\' 4"  (1.626 m)    Gen: Elizabeth Wu, elderly woman, in no distress,  normal affect  ENT: No lesions,  mouth clear,  edentulous, oropharynx clear, no postnasal drip  Neck: No JVD, no stridor  Lungs: No use of accessory muscles, distant, no crackles or wheezing on normal respiration, no wheeze on forced expiration  Cardiovascular: RRR, heart sounds normal, no murmur or gallops, no peripheral edema  Musculoskeletal: No deformities, no cyanosis or clubbing  Neuro: alert, awake, non focal  Skin: Warm, no lesions or rash      Assessment & Plan:  Pulmonary nodules Noted on PET scan 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  Need to differentiate between primary lung cancer and metastatic breast cancer.  Explained the rationale to her today.  We will work on setting up navigational bronchoscopy, try to sample the right lower lobe and right middle lobe abnormalities.  I will try to get this done between 12/21 and 12/23.  She will need a super D CT to plan the case and we will try to get  this arranged ASAP.  We will work on setting up bronchoscopy to evaluate your right lung nodules.  This would be done as an outpatient under general anesthesia at [facility] endoscopy.  You will need a designated driver.  We will try to get this scheduled between 12/21 and 12/23. We will arrange for a CT scan of your chest to be used during the procedure We will work on helping you decrease your smoking as we go forward. Follow Dr. Lamonte Sakai in 1 month or next available.    Baltazar Apo, MD, PhD 11/05/2021, 10:11 AM [facility] [phone] or if no answer before 7:00PM call [phone] For any issues after 7:00PM please call eLink [phone]

## 2021-11-08 ENCOUNTER — Ambulatory Visit (HOSPITAL_BASED_OUTPATIENT_CLINIC_OR_DEPARTMENT_OTHER)
Admission: RE | Admit: 2021-11-08 | Discharge: 2021-11-08 | Disposition: A | Discharge status: Home / Self Care | Payer: Medicare HMO | Source: Ambulatory Visit | Attending: Emergency Medicine | Admitting: Emergency Medicine

## 2021-11-08 ENCOUNTER — Other Ambulatory Visit (HOSPITAL_COMMUNITY): Payer: Self-pay | Admitting: Physician Assistant

## 2021-11-08 ENCOUNTER — Other Ambulatory Visit: Payer: Self-pay | Admitting: Emergency Medicine

## 2021-11-08 ENCOUNTER — Other Ambulatory Visit: Payer: Self-pay

## 2021-11-08 DIAGNOSIS — R918 Other nonspecific abnormal finding of lung field: Secondary | ICD-10-CM | POA: Diagnosis not present

## 2021-11-08 DIAGNOSIS — R911 Solitary pulmonary nodule: Secondary | ICD-10-CM | POA: Diagnosis not present

## 2021-11-08 DIAGNOSIS — I700 Atherosclerosis of aorta: Secondary | ICD-10-CM | POA: Diagnosis not present

## 2021-11-09 ENCOUNTER — Encounter (HOSPITAL_COMMUNITY): Payer: Self-pay | Admitting: Emergency Medicine

## 2021-11-09 ENCOUNTER — Other Ambulatory Visit: Payer: Self-pay

## 2021-11-09 ENCOUNTER — Ambulatory Visit (HOSPITAL_COMMUNITY)
Admission: RE | Admit: 2021-11-09 | Discharge: 2021-11-09 | Disposition: A | Discharge status: Home / Self Care | Payer: Medicare HMO | Source: Ambulatory Visit | Attending: Hematology | Admitting: Hematology

## 2021-11-09 DIAGNOSIS — R222 Localized swelling, mass and lump, trunk: Secondary | ICD-10-CM | POA: Diagnosis not present

## 2021-11-09 DIAGNOSIS — K7460 Unspecified cirrhosis of liver: Secondary | ICD-10-CM | POA: Insufficient documentation

## 2021-11-09 DIAGNOSIS — M7989 Other specified soft tissue disorders: Secondary | ICD-10-CM | POA: Diagnosis not present

## 2021-11-09 DIAGNOSIS — E785 Hyperlipidemia, unspecified: Secondary | ICD-10-CM | POA: Diagnosis not present

## 2021-11-09 DIAGNOSIS — I2510 Atherosclerotic heart disease of native coronary artery without angina pectoris: Secondary | ICD-10-CM | POA: Diagnosis not present

## 2021-11-09 DIAGNOSIS — C50212 Malignant neoplasm of upper-inner quadrant of left female breast: Secondary | ICD-10-CM | POA: Insufficient documentation

## 2021-11-09 DIAGNOSIS — F17210 Nicotine dependence, cigarettes, uncomplicated: Secondary | ICD-10-CM | POA: Diagnosis not present

## 2021-11-09 DIAGNOSIS — C50411 Malignant neoplasm of upper-outer quadrant of right female breast: Secondary | ICD-10-CM | POA: Diagnosis not present

## 2021-11-09 DIAGNOSIS — R918 Other nonspecific abnormal finding of lung field: Secondary | ICD-10-CM | POA: Diagnosis not present

## 2021-11-09 DIAGNOSIS — I700 Atherosclerosis of aorta: Secondary | ICD-10-CM | POA: Diagnosis not present

## 2021-11-09 DIAGNOSIS — C7800 Secondary malignant neoplasm of unspecified lung: Secondary | ICD-10-CM | POA: Insufficient documentation

## 2021-11-09 DIAGNOSIS — C7989 Secondary malignant neoplasm of other specified sites: Secondary | ICD-10-CM | POA: Diagnosis not present

## 2021-11-09 LAB — SARS CORONAVIRUS 2 (TAT 6-24 HRS): SARS Coronavirus 2: NEGATIVE

## 2021-11-09 LAB — CBC
HCT: 37.1 % (ref 36.0–46.0)
Hemoglobin: 12.6 g/dL (ref 12.0–15.0)
MCH: 32.6 pg (ref 26.0–34.0)
MCHC: 34 g/dL (ref 30.0–36.0)
MCV: 95.9 fL (ref 80.0–100.0)
Platelets: 392 10*3/uL (ref 150–400)
RBC: 3.87 MIL/uL (ref 3.87–5.11)
RDW: 13 % (ref 11.5–15.5)
WBC: 8.6 10*3/uL (ref 4.0–10.5)
nRBC: 0 % (ref 0.0–0.2)

## 2021-11-09 LAB — PROTIME-INR
INR: 1 (ref 0.8–1.2)
Prothrombin Time: 12.9 seconds (ref 11.4–15.2)

## 2021-11-09 IMAGING — US IR BIOPSY CORE MUSCLE/SOFT TISSUE
1 series · 13 of 23 positions shown · non-contrast
Comparison: PET-CT-[DATE]

INDICATION: Concern for metastatic breast cancer, now with hypermetabolic
subcutaneous nodule along the medial aspect of the left lower back.
Please perform ultrasound-guided biopsy for tissue diagnostic
purposes.

EXAM:
ULTRASOUND-GUIDED BIOPSY SUBCUTANEOUS NODULE INVOLVING THE MID,
MEDIAL ASPECT THE LEFT SIDE OF THE BACK.
TECHNIQUE: Informed written consent was obtained from the patient after a
discussion of the risks, benefits and alternatives to treatment.
Questions regarding the procedure were encouraged and answered.
Initial ultrasound scanning demonstrated an approximately 1.4 x
cm hypoechoic subcutaneous nodule within the mid, medial aspect of
the left side of the back (image 2), correlating with the
hypermetabolic nodule seen on preceding PET-CT image 110, series 3.
An ultrasound image was saved for documentation purposes. The
procedure was planned. A timeout was performed prior to the
initiation of the procedure.

[Series 1: us core biopsy (soft tissue) · 13 of 23 slices shown]
[im 1/23]
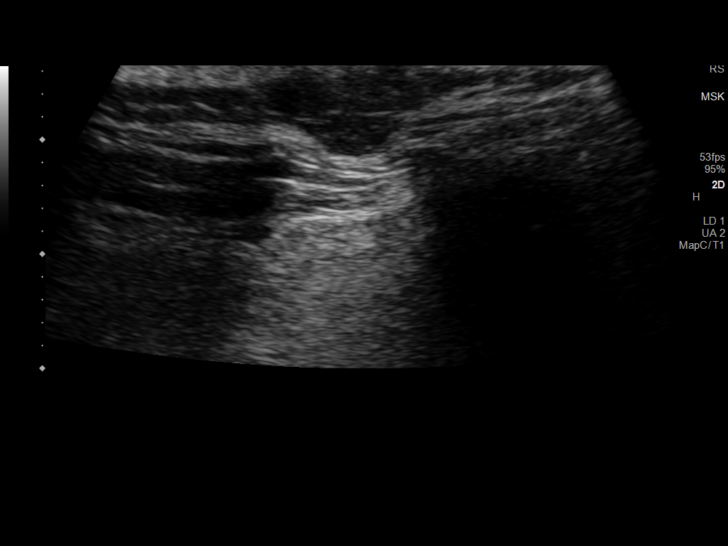
[im 3/23]
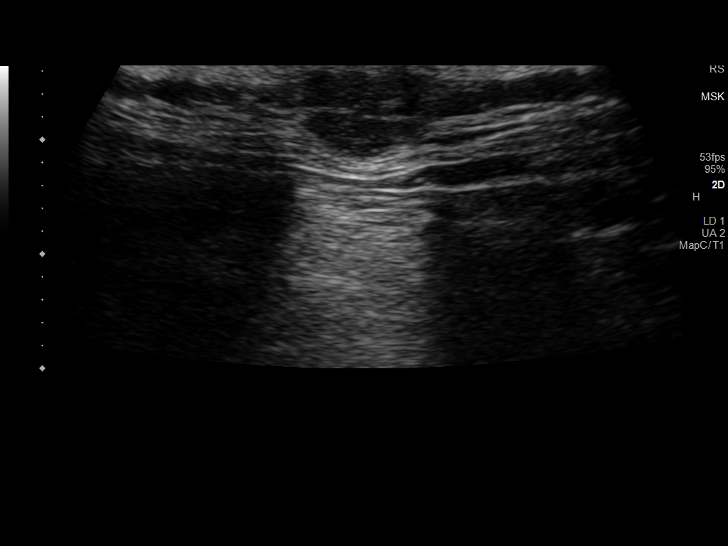
[im 5/23]
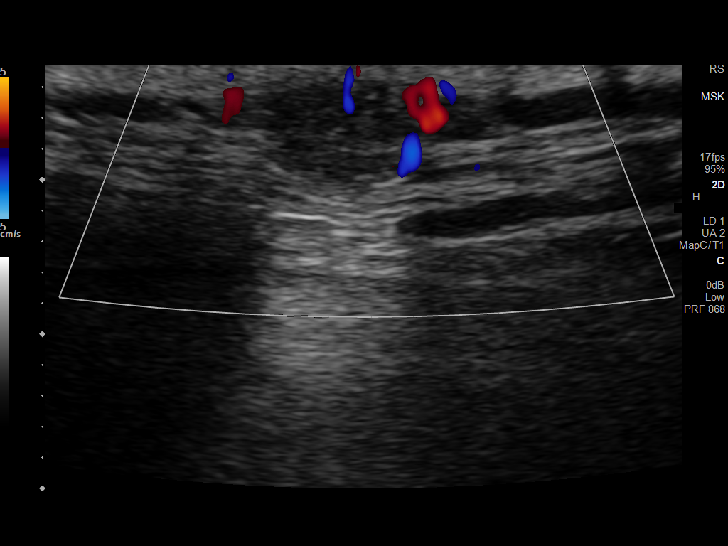
[im 7/23]
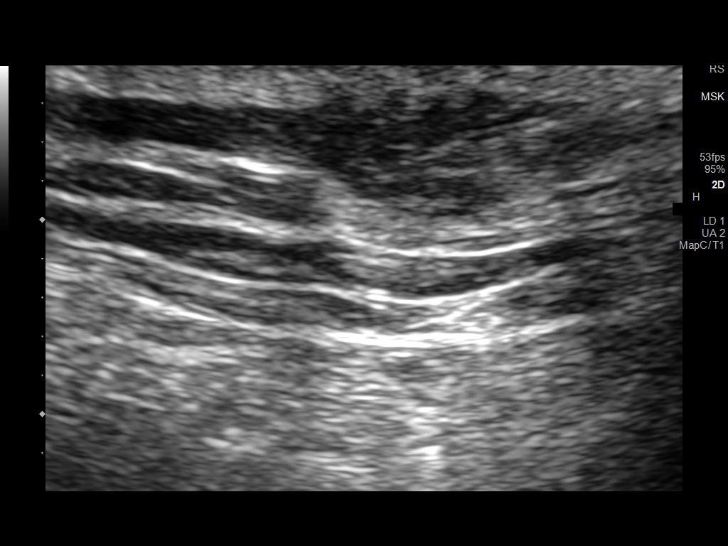
[im 8/23]
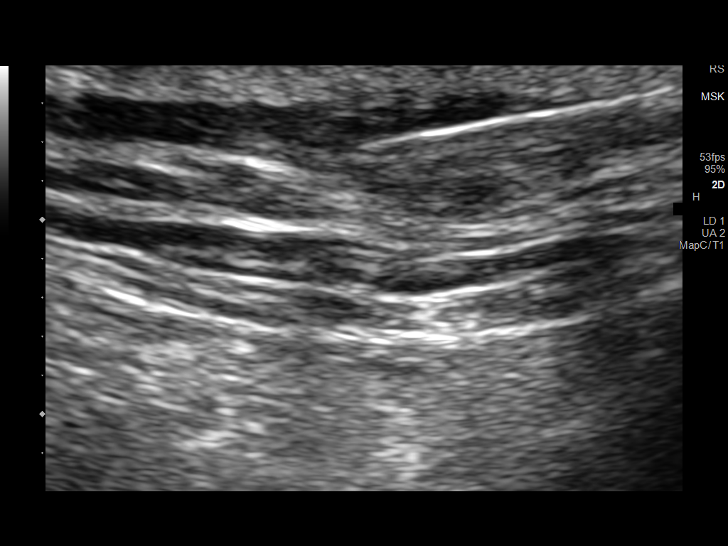
[im 10/23]
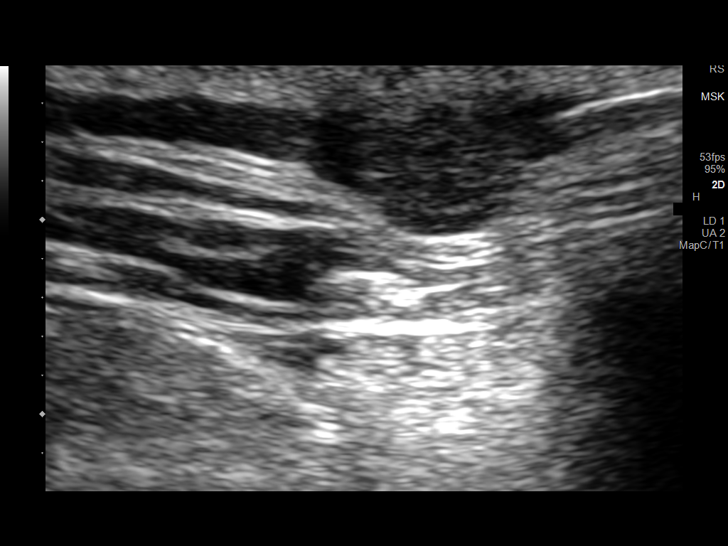
[im 12/23]
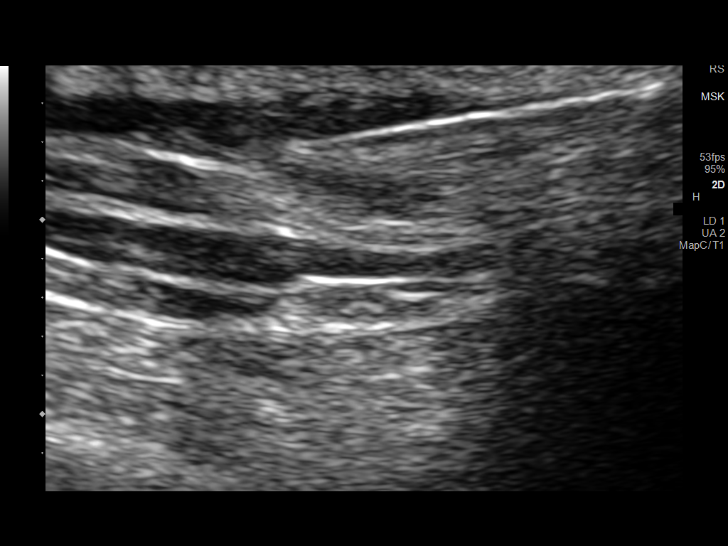
[im 14/23]
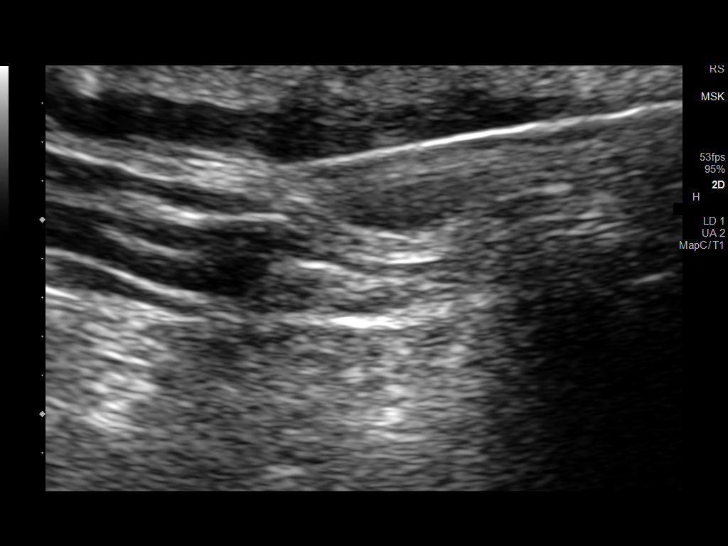
[im 16/23]
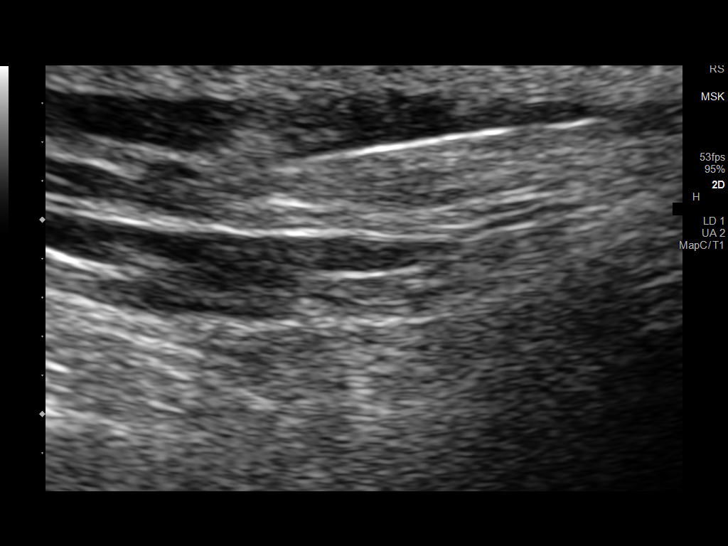
[im 17/23]
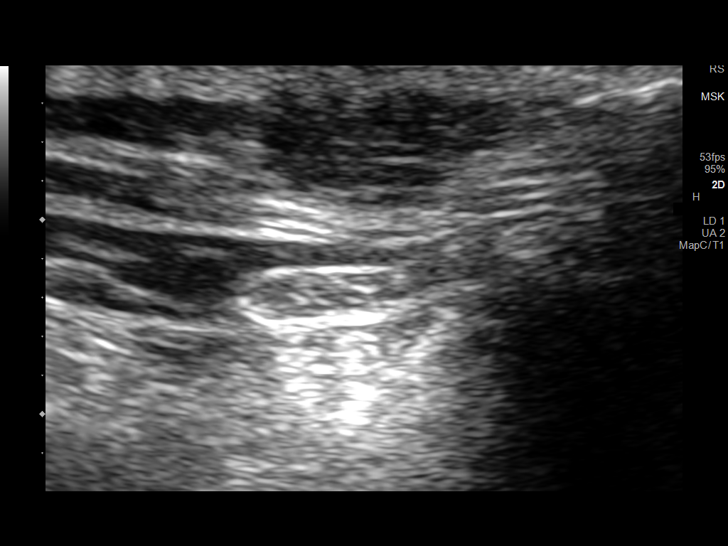
[im 19/23]
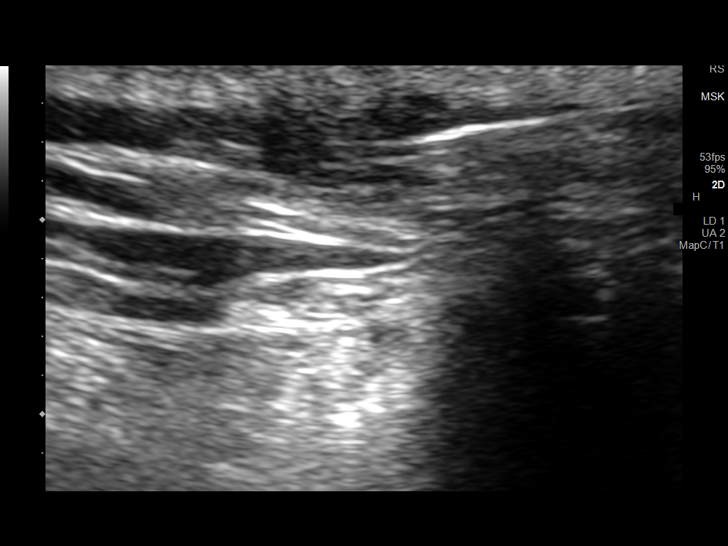
[im 21/23]
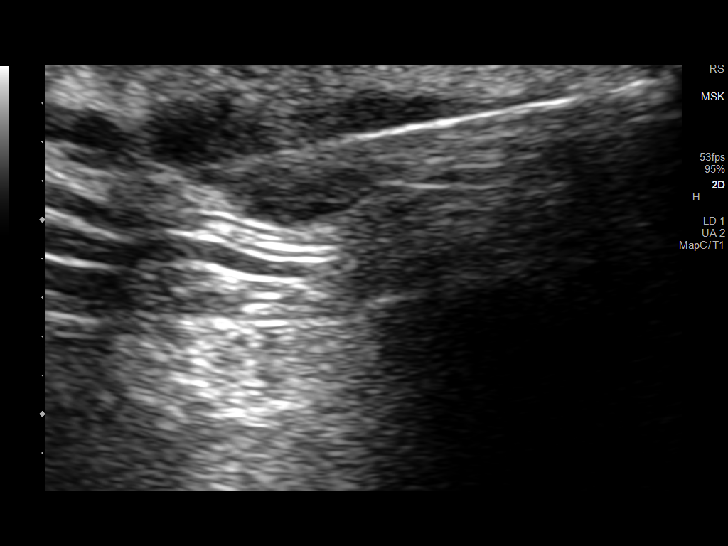
[im 23/23]
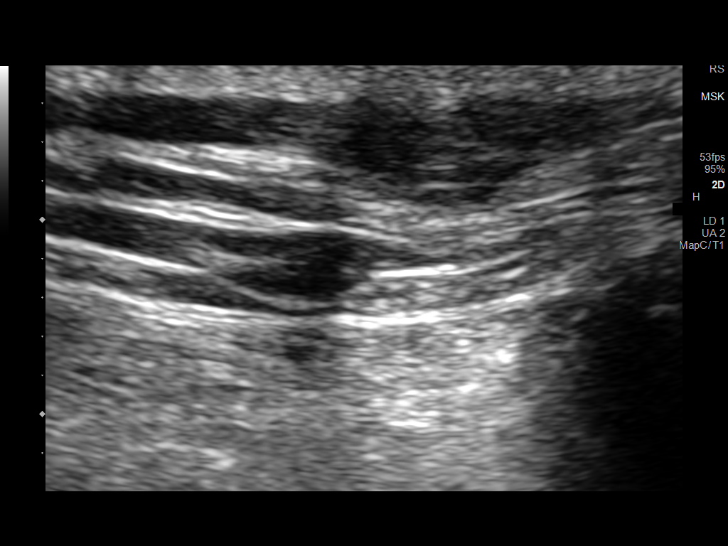

[13 of 23 positions shown; findings below may reference images not displayed]

MEDICATIONS:
None

ANESTHESIA/SEDATION:
None, per patient request

COMPLICATIONS:
None immediate.
The operative was prepped and draped in the usual sterile fashion,
and a sterile drape was applied covering the operative field. A
timeout was performed prior to the initiation of the procedure.
Local anesthesia was provided with 1% lidocaine with epinephrine.

Under direct ultrasound guidance, an 18 gauge core needle device was
utilized to obtain to obtain 6 core needle biopsies of the
indeterminate subcutaneous nodule within the mid, medial aspect of
the left side of the back.

The samples were placed in saline and submitted to pathology. The
needle was removed and hemostasis was achieved with manual
compression. Post procedure scan was negative for significant
hematoma. A dressing was placed. The patient tolerated the procedure
well without immediate postprocedural complication.
IMPRESSION: Technically successful ultrasound guided biopsy of indeterminate
hypermetabolic subcutaneous nodule within the mid, medial aspect of
the left side of the back.

## 2021-11-09 MED ORDER — LIDOCAINE-EPINEPHRINE 1 %-1:100000 IJ SOLN
INTRAMUSCULAR | Status: AC
  Filled 2021-11-09: qty 1

## 2021-11-09 MED ORDER — SODIUM CHLORIDE 0.9 % IV SOLN: INTRAVENOUS | Status: DC

## 2021-11-09 NOTE — Consult Note (Signed)
Chief Complaint: Patient was seen in consultation today for  image guided biopsy of subcutaneous back soft tissue  nodule   Referring Physician(s): Katragadda,Sreedhar  Supervising Physician: Sandi Mariscal  Patient Status: [facility] - Out-pt  History of Present Illness: Elizabeth Wu is a 70 y.o. female smoker  with hx recently diagnosed bilateral breast cancer, HLD, and PET scan on 10/22/21 which revealed:  1. Metastatic breast cancer as evidenced by hypermetabolic nodules in the left breast, pulmonary lymphangitic carcinomatosis, peritoneal carcinomatosis and hypermetabolic subcutaneous soft tissue nodules. 2. Cirrhosis. 3. Cholelithiasis. 4. Left renal stone. 5. Aortic atherosclerosis (ICD10-I70.0). Coronary artery calcification.  CT chest yesterday revealed:  . Redemonstrated, spiculated mass of the superior segment right lower lobe with partial cavitation measuring 3.4 x 2.1 cm. Additional finely spiculated nodule of the lateral segment right middle lobe measuring 0.9 x 0.9 cm. These nodules were both previously PET avid 2. Innumerable additional small nodules throughout the lungs, many concentrated along the fissures, with interlobular septal thickening throughout. 3. Multiple peritoneal nodules in the included upper abdomen. 4. Constellation of findings is not significantly changed compared to prior PET-CT dated 10/21/2021 and consistent with widespread metastatic disease, including pulmonary nodular, lymphangitic, and peritoneal metastatic disease. 5. 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 Recommend cardiothoracic/vascular surgery referral if clinically appropriate given advanced malignancy. This recommendation follows 2010 ACCF/AHA/AATS/ACR/ASA/SCA/SCAI/SIR/STS/SVM  Guidelines for the Diagnosis and Management of Patients With Thoracic Aortic Disease. Circulation. 2010; 121: G867-Y195. Aortic aneurysm NOS (ICD10-I71.9) 6. Coronary artery disease.   Aortic Atherosclerosis (ICD10-I70.0).  She is scheduled for bronchoscopy on 12/22. She presents today for image guided biopsy of SQ ST back nodule.   Past Medical History:  Diagnosis Date   Allergy    DJD (degenerative joint disease)    Hyperlipidemia    Osteopenia     Past Surgical History:  Procedure Laterality Date   APPENDECTOMY     HERNIA REPAIR     TONSILLECTOMY     TONSILLECTOMY      Allergies: Patient has no known allergies.  Medications: Prior to Admission medications   Medication Sig Start Date End Date Taking? Authorizing Provider  cholecalciferol (VITAMIN D3) 25 MCG (1000 UNIT) tablet Take 1,000 Units by mouth daily.   Yes [provider]  Ensure (ENSURE) Take 237 mLs by mouth 2 (two) times daily between meals.   Yes [provider]  folic acid (FOLVITE) 1 MG tablet Take 1 mg by mouth daily.   Yes [provider]  HYDROcodone-acetaminophen (NORCO/VICODIN) 5-325 MG tablet Take 1 tablet by mouth every 6 (six) hours as needed for moderate pain. 10/25/21  Yes Derek Jack, MD  ibuprofen (ADVIL) 200 MG tablet Take 400 mg by mouth every 6 (six) hours as needed for moderate pain.   Yes [provider]  loperamide (IMODIUM A-D) 2 MG tablet Take 2 mg by mouth 4 (four) times daily as needed for diarrhea or loose stools.   Yes [provider]  losartan (COZAAR) 50 MG tablet Take 1 tablet (50 mg total) by mouth daily. 11/30/20  Yes Gottschalk, Leatrice Jewels M, DO  Omega-3 Fatty Acids (FISH OIL) 1000 MG CAPS Take 1,000 mg by mouth daily.   Yes [provider]  prochlorperazine (COMPAZINE) 10 MG tablet Take 1 tablet (10 mg total) by mouth every 6 (  six) hours as needed for nausea or vomiting. 10/25/21  Yes Derek Jack, MD  simvastatin  (ZOCOR) 20 MG tablet Take 1 tablet (20 mg total) by mouth at bedtime. 11/30/20  Yes Gottschalk, Leatrice Jewels M, DO  cyanocobalamin (,VITAMIN B-12,) 1000 MCG/ML injection Inject 1,000 mcg into the muscle every 30 (thirty) days.    [provider]  dronabinol (MARINOL) 2.5 MG capsule Take 1 capsule (2.5 mg total) by mouth 2 (two) times daily before a meal. 10/25/21   Derek Jack, MD  ibuprofen (ADVIL) 600 MG tablet Take 1 tablet (600 mg total) by mouth 2 (two) times daily as needed for moderate pain. 08/06/21   Ivy Lynn, NP     Family History  Problem Relation Age of Onset   Alzheimer's disease Mother    Cancer Father        stomach cancer   Diabetes Sister    Heart disease Brother        MI    Social History   Socioeconomic History   Marital status: Single    Spouse name: Not on file   Number of children: Not on file   Years of education: Not on file   Highest education level: 12th grade  Occupational History   Occupation: [employer]  Tobacco Use   Smoking status: Every Day    Packs/day: 0.50    Years: 40.00    Pack years: 20.00    Types: Cigarettes   Smokeless tobacco: Never   Tobacco comments:    2 cigarettes smoked daily ARJ 11/05/21  Vaping Use   Vaping Use: Never used  Substance and Sexual Activity   Alcohol use: No   Drug use: No   Sexual activity: Not on file  Other Topics Concern   Not on file  Social History Narrative   Not on file   Social Determinants of Health   Financial Resource Strain: Not on file  Food Insecurity: Not on file  Transportation Needs: Not on file  Physical Activity: Not on file  Stress: Not on file  Social Connections: Not on file      Review of Systems denies fever,HA,CP,worsening dyspnea, vomiting or bleeding; she does have wt loss/ dim appetite, abd/back pain with rad down RLE, occ nausea  Vital Signs: BP 118/83 (BP Location: Right Arm)    Pulse (!) 108    Temp 98.3 F (36.8 C) (Oral)    Ht 5\' 4"  (1.626  m)    Wt 133 lb (60.3 kg)    BMI 22.83 kg/m   Physical Exam awake/alert; chest- dim BS on right , left clear; heart- RRR; abd- soft, some diffuse gen tenderness to palpation, few BS; no LE edema; painful SQ nodule left mid back region  Imaging: NM PET Image Initial (PI) Skull Base To Thigh (F-18 FDG)  Result Date: 10/22/2021 CLINICAL DATA:  Initial treatment strategy for breast cancer. EXAM: NUCLEAR MEDICINE PET SKULL BASE TO THIGH TECHNIQUE: 8.0 mCi F-18 FDG was injected intravenously. Full-ring PET imaging was performed from the skull base to thigh after the radiotracer. CT data was obtained and used for attenuation correction and anatomic localization. Fasting blood glucose: 134 mg/dl COMPARISON:  CT abdomen pelvis 03/26/2010. FINDINGS: Mediastinal blood pool activity: SUV max 2.3 Liver activity: SUV max NA NECK: No abnormal hypermetabolism. Incidental CT findings: None. CHEST: Spiculated mass in the superior segment right lower lobe measures 2.3 x 3.4 cm (7/101) with an SUV max 8.1. Hypermetabolic right middle lobe nodule measures 9 mm (  7/132) with an SUV max 2.7. No hypermetabolic mediastinal, hilar or axillary lymph nodes. A 1.5 x 2.1 cm nodule in the upper outer left breast has an SUV max 2.8. 12 mm nodule in the supra-areolar medial left breast has an SUV max 3.1. Subcutaneous soft tissue nodule overlying the posterior lower left ribs measures 1.4 cm (3/110) with an SUV max 2.8. Incidental CT findings: Atherosclerotic calcification of the aorta, aortic valve and coronary arteries. There may be a calcified ductus diverticulum along the inferior margin of the aortic arch. Heart size normal. No pericardial or pleural effusion. Extensive perilymphatic nodularity throughout the lungs, in addition to the nodules and masses described above. ABDOMEN/PELVIS: No abnormal hypermetabolism in the liver, adrenal glands, spleen or pancreas. 10 mm left omental nodule (3/145), SUV max 2.3. Soft tissue nodule in left  pericolic mesentery measures 7 mm (3/180) with an SUV max 1.8. Soft tissue nodule posterior to the left psoas muscle measures 9 mm (3/195) with an SUV max 2.1. Incidental CT findings: Subcentimeter low-attenuation lesion in the left hepatic lobe, too small to characterize. Liver margin is irregular. Stone in the gallbladder. Adrenal glands and right kidney are unremarkable. Punctate stone in the left kidney. Spleen, pancreas, stomach and bowel are unremarkable. Tiny nodule posterior to the upper pole right kidney measures approximately 3 mm, too small for PET resolution but likely metastatic. Additional scattered small subcutaneous nodules, too small for PET resolution but also likely metastatic. Atherosclerotic calcification of the aorta. SKELETON: No abnormal hypermetabolism. Incidental CT findings: Degenerative changes in the spine.  Osteopenia. IMPRESSION: 1. Metastatic breast cancer as evidenced by hypermetabolic nodules in the left breast, pulmonary lymphangitic carcinomatosis, peritoneal carcinomatosis and hypermetabolic subcutaneous soft tissue nodules. 2. Cirrhosis. 3. Cholelithiasis. 4. Left renal stone. 5. Aortic atherosclerosis (ICD10-I70.0). Coronary artery calcification. Electronically Signed   By: Lorin Picket M.D.   On: 10/22/2021 12:07   CT Super D Chest Wo Contrast  Result Date: 11/09/2021 CLINICAL DATA:  Metastatic breast cancer, right lower lobe mass EXAM: CT CHEST WITHOUT CONTRAST TECHNIQUE: Multidetector CT imaging of the chest was performed using thin slice collimation for electromagnetic bronchoscopy planning purposes, without intravenous contrast. COMPARISON:  PET-CT, 10/21/2021 FINDINGS: Cardiovascular: Aortic atherosclerosis. 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  Normal heart size. Three-vessel coronary artery calcifications. No pericardial effusion. Mediastinum/Nodes: No enlarged mediastinal, hilar, or axillary lymph nodes. Thyroid gland, trachea, and esophagus demonstrate no significant findings. Lungs/Pleura: Redemonstrated, spiculated mass of the superior segment right lower lobe with partial cavitation measuring 3.4 x 2.1 cm (series 3, image 29). Additional finely spiculated nodule of the lateral segment right middle lobe measuring 0.9 x 0.9 cm (series 3, image 30). Innumerable additional small nodules throughout the lungs, many concentrated along the fissures, with interlobular septal thickening throughout. No pleural effusion or pneumothorax. Upper Abdomen: No acute abnormality. Gallstone. Multiple peritoneal nodules in the included upper abdomen (series 2, image 54) Musculoskeletal: No chest wall mass or suspicious bone lesions identified. Left breast masses containing biopsy marking clips (series 2, image 23, 9). IMPRESSION: 1. Redemonstrated, spiculated mass of the superior segment right lower lobe with partial cavitation measuring 3.4 x 2.1 cm. Additional finely spiculated nodule of the lateral segment right middle lobe measuring 0.9 x 0.9 cm. These nodules were both previously PET avid 2.  Innumerable additional small nodules throughout the lungs, many concentrated along the fissures, with interlobular septal thickening throughout. 3. Multiple peritoneal nodules in the included upper abdomen. 4. Constellation of findings is not significantly changed compared to prior PET-CT dated 10/21/2021 and consistent with widespread metastatic disease, including pulmonary nodular, lymphangitic, and peritoneal metastatic disease. 5. 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 Recommend  cardiothoracic/vascular surgery referral if clinically appropriate given advanced malignancy. This recommendation follows 2010 ACCF/AHA/AATS/ACR/ASA/SCA/SCAI/SIR/STS/SVM Guidelines for the Diagnosis and Management of Patients With Thoracic Aortic Disease. Circulation. 2010; 121: G387-F643. Aortic aneurysm NOS (ICD10-I71.9) 6. Coronary artery disease. Aortic Atherosclerosis (ICD10-I70.0). Electronically Signed   By: Delanna Ahmadi M.D.   On: 11/09/2021 09:08    Labs:  CBC: Recent Labs    07/06/21 0905  WBC 6.8  HGB 12.2  HCT 36.9  PLT 325    COAGS: No results for input(s): INR, APTT in the last 8760 hours.  BMP: Recent Labs    11/30/20 0931 03/01/21 1001 07/06/21 0905 07/21/21 1530  NA 139 143 136 142  K 4.2 4.8 3.0* 3.2*  CL 102 103 102 101  CO2 25 24 27 25   GLUCOSE 93 95 98 123*  BUN 15 13 11 10   CALCIUM 9.4 9.9 8.8* 9.9  CREATININE 0.85 0.91 0.78 0.82  GFRNONAA 70  --  >60  --   GFRAA 81  --   --   --     LIVER FUNCTION TESTS: Recent Labs    03/01/21 1001 07/06/21 0905  BILITOT 0.8 1.2  AST 15 15  ALT 15 13  ALKPHOS 126* 83  PROT 7.3 6.8  ALBUMIN 4.7 3.7    TUMOR MARKERS: No results for input(s): AFPTM, CEA, CA199, CHROMGRNA in the last 8760 hours.  Assessment and Plan: 70 y.o. female smoker  with hx recently diagnosed bilateral breast cancer, HLD, and PET scan on 10/22/21 which revealed:  1. Metastatic breast cancer as evidenced by hypermetabolic nodules in the left breast, pulmonary lymphangitic carcinomatosis, peritoneal carcinomatosis and hypermetabolic subcutaneous soft tissue nodules. 2. Cirrhosis. 3. Cholelithiasis. 4. Left renal stone. 5. Aortic atherosclerosis (ICD10-I70.0). Coronary artery calcification.  CT chest yesterday revealed:  . Redemonstrated, spiculated mass of the superior segment right lower lobe with partial cavitation measuring 3.4 x 2.1 cm. Additional finely spiculated nodule of the lateral segment right middle lobe  measuring 0.9 x 0.9 cm. These nodules were both previously PET avid 2. Innumerable additional small nodules throughout the lungs, many concentrated along the fissures, with interlobular septal thickening throughout. 3. Multiple peritoneal nodules in the included upper abdomen. 4. Constellation of findings is not significantly changed compared to prior PET-CT dated 10/21/2021 and consistent with widespread metastatic disease, including pulmonary nodular, lymphangitic, and peritoneal metastatic disease. 5. 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 Recommend cardiothoracic/vascular surgery referral if clinically appropriate given advanced malignancy. This recommendation follows 2010 ACCF/AHA/AATS/ACR/ASA/SCA/SCAI/SIR/STS/SVM Guidelines for the Diagnosis and Management of Patients With Thoracic Aortic Disease. Circulation. 2010; 121: P295-J884. Aortic aneurysm NOS (ICD10-I71.9) 6. Coronary artery disease.   Aortic Atherosclerosis (ICD10-I70.0).  She is scheduled for bronchoscopy on 12/22. She presents today for image  guided biopsy of SQ ST back nodule.Risks and benefits of procedure was discussed with the patient  including, but not limited to bleeding, infection, damage to adjacent structures or low yield requiring additional tests.  All of the questions were answered and there is agreement to proceed.  Consent signed and in chart.    Thank you for this interesting consult.  I greatly enjoyed meeting Elizabeth Wu and look forward to participating in their care.  A copy of this report was sent to the requesting provider on this date.  Electronically Signed: D. Rowe Robert, PA-C 11/09/2021, 11:53 AM   I spent a total of 25 minutes    in face to face in clinical consultation, greater than 50%  of which was counseling/coordinating care for image guided biopsy of subcutaneous soft tissue back nodule

## 2021-11-09 NOTE — Progress Notes (Signed)
Elizabeth Wu denies chest pain or shortness of breath. Patient denies having any s/s of Covid in her household.  Patient denies any known exposure to Covid.  Elizabeth Wu tested negative for Covid 11/08/21. I instructed Elizabeth Wu to hold vitamins and fish oil until after procedure.  Mr. Yum PCP is Dr Ronnie Doss.  I instructed patient to shower with antibiotic soap, if it is available.  Dry off with a clean towel. Do not put lotion, powder, cologne or deodorant or makeup.No jewelry or piercings. Men may shave their face and neck. Woman should not shave. No nail polish, artificial or acrylic nails. Wear clean clothes, brush your teeth. Glasses, contact lens,dentures or partials may not be worn in the OR. If you need to wear them, please bring a case for glasses, do not wear contacts or bring a case, the hospital does not have contact cases, dentures or partials will have to be removed , make sure they are clean, we will provide a denture cup to put them in. You will need some one to drive you home and a responsible person over the age of 9 to stay with you for the first 24 hours after surgery.

## 2021-11-09 NOTE — Procedures (Signed)
Pre Procedure Dx: Concern for metastatic breast cancer.  Post Procedural Dx: Same  Technically successful US guided biopsy of indeterminate nodule within the SQ tissue of the medial aspect of the right side of the back.   EBL: None No immediate complications.   Ronny Bacon, MD Pager #: [phone]

## 2021-11-11 ENCOUNTER — Ambulatory Visit (HOSPITAL_COMMUNITY)
Admission: RE | Admit: 2021-11-11 | Discharge: 2021-11-11 | Disposition: A | Discharge status: Home / Self Care | Payer: Medicare HMO | Source: Ambulatory Visit | Attending: Emergency Medicine | Admitting: Emergency Medicine

## 2021-11-11 ENCOUNTER — Ambulatory Visit (HOSPITAL_COMMUNITY): Payer: Medicare HMO

## 2021-11-11 ENCOUNTER — Ambulatory Visit (HOSPITAL_COMMUNITY): Payer: Medicare HMO | Admitting: Certified Registered Nurse Anesthetist

## 2021-11-11 ENCOUNTER — Encounter (HOSPITAL_COMMUNITY)
Admission: RE | Disposition: A | Discharge status: Home / Self Care | Payer: Self-pay | Source: Ambulatory Visit | Attending: Emergency Medicine

## 2021-11-11 ENCOUNTER — Other Ambulatory Visit: Payer: Self-pay

## 2021-11-11 ENCOUNTER — Encounter (HOSPITAL_COMMUNITY): Payer: Self-pay | Admitting: Emergency Medicine

## 2021-11-11 DIAGNOSIS — J984 Other disorders of lung: Secondary | ICD-10-CM | POA: Diagnosis not present

## 2021-11-11 DIAGNOSIS — Z87891 Personal history of nicotine dependence: Secondary | ICD-10-CM | POA: Insufficient documentation

## 2021-11-11 DIAGNOSIS — Z9889 Other specified postprocedural states: Secondary | ICD-10-CM

## 2021-11-11 DIAGNOSIS — C50919 Malignant neoplasm of unspecified site of unspecified female breast: Secondary | ICD-10-CM | POA: Insufficient documentation

## 2021-11-11 DIAGNOSIS — C7801 Secondary malignant neoplasm of right lung: Secondary | ICD-10-CM | POA: Insufficient documentation

## 2021-11-11 DIAGNOSIS — R918 Other nonspecific abnormal finding of lung field: Secondary | ICD-10-CM

## 2021-11-11 DIAGNOSIS — C3411 Malignant neoplasm of upper lobe, right bronchus or lung: Secondary | ICD-10-CM | POA: Diagnosis not present

## 2021-11-11 DIAGNOSIS — C3431 Malignant neoplasm of lower lobe, right bronchus or lung: Secondary | ICD-10-CM | POA: Diagnosis not present

## 2021-11-11 DIAGNOSIS — R911 Solitary pulmonary nodule: Secondary | ICD-10-CM | POA: Diagnosis not present

## 2021-11-11 DIAGNOSIS — M1990 Unspecified osteoarthritis, unspecified site: Secondary | ICD-10-CM | POA: Diagnosis not present

## 2021-11-11 DIAGNOSIS — I10 Essential (primary) hypertension: Secondary | ICD-10-CM | POA: Diagnosis not present

## 2021-11-11 DIAGNOSIS — Z419 Encounter for procedure for purposes other than remedying health state, unspecified: Secondary | ICD-10-CM

## 2021-11-11 DIAGNOSIS — E785 Hyperlipidemia, unspecified: Secondary | ICD-10-CM | POA: Diagnosis not present

## 2021-11-11 HISTORY — PX: BRONCHIAL NEEDLE ASPIRATION BIOPSY: SHX5106

## 2021-11-11 HISTORY — DX: Essential (primary) hypertension: I10

## 2021-11-11 HISTORY — DX: Malignant (primary) neoplasm, unspecified: C80.1

## 2021-11-11 HISTORY — DX: Other complications of anesthesia, initial encounter: T88.59XA

## 2021-11-11 HISTORY — PX: BRONCHIAL BIOPSY: SHX5109

## 2021-11-11 HISTORY — DX: Nausea with vomiting, unspecified: R11.2

## 2021-11-11 HISTORY — PX: FIDUCIAL MARKER PLACEMENT: SHX6858

## 2021-11-11 HISTORY — PX: BRONCHIAL BRUSHINGS: SHX5108

## 2021-11-11 HISTORY — DX: Other specified postprocedural states: Z98.890

## 2021-11-11 IMAGING — CR DG CHEST 1V PORT
2 series · 2 of 2 positions shown · non-contrast
Comparison: [DATE]

CLINICAL DATA: Post bronchoscopy with biopsy

EXAM:
PORTABLE CHEST 1 VIEW

[AP (1 of 2)]
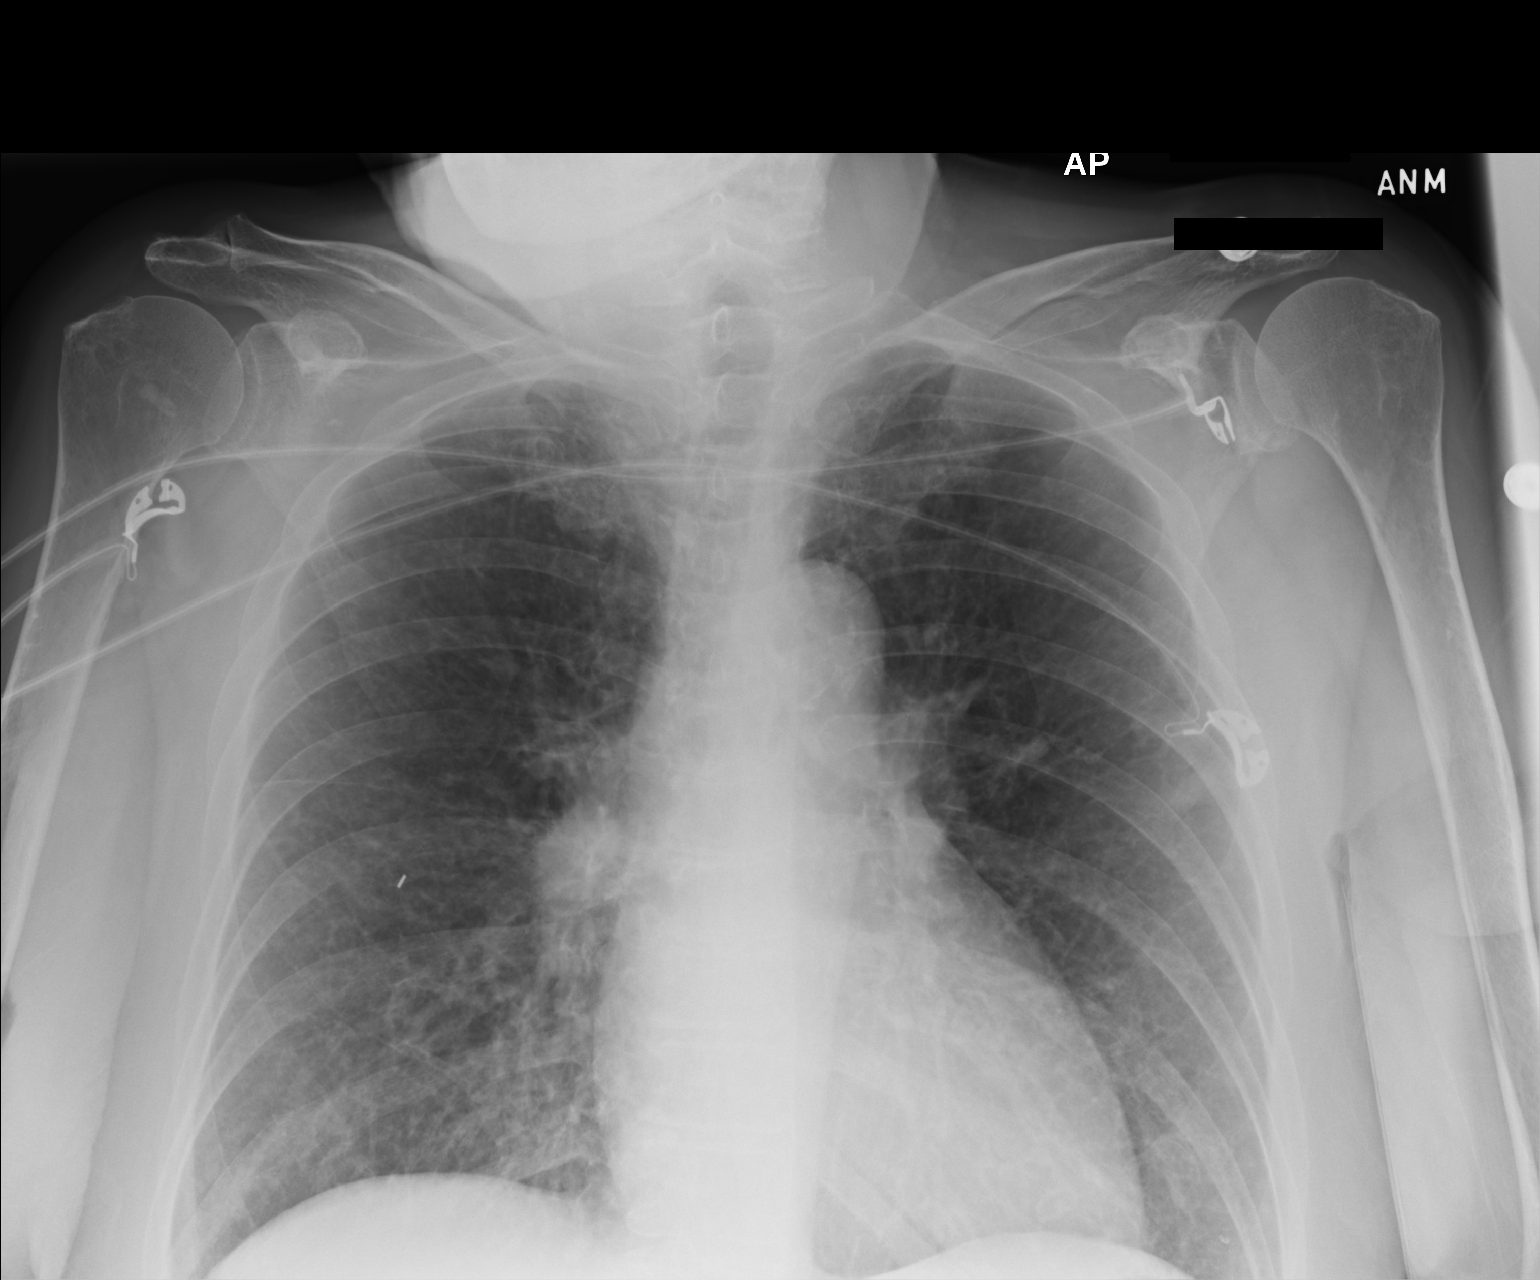

[AP (2 of 2)]
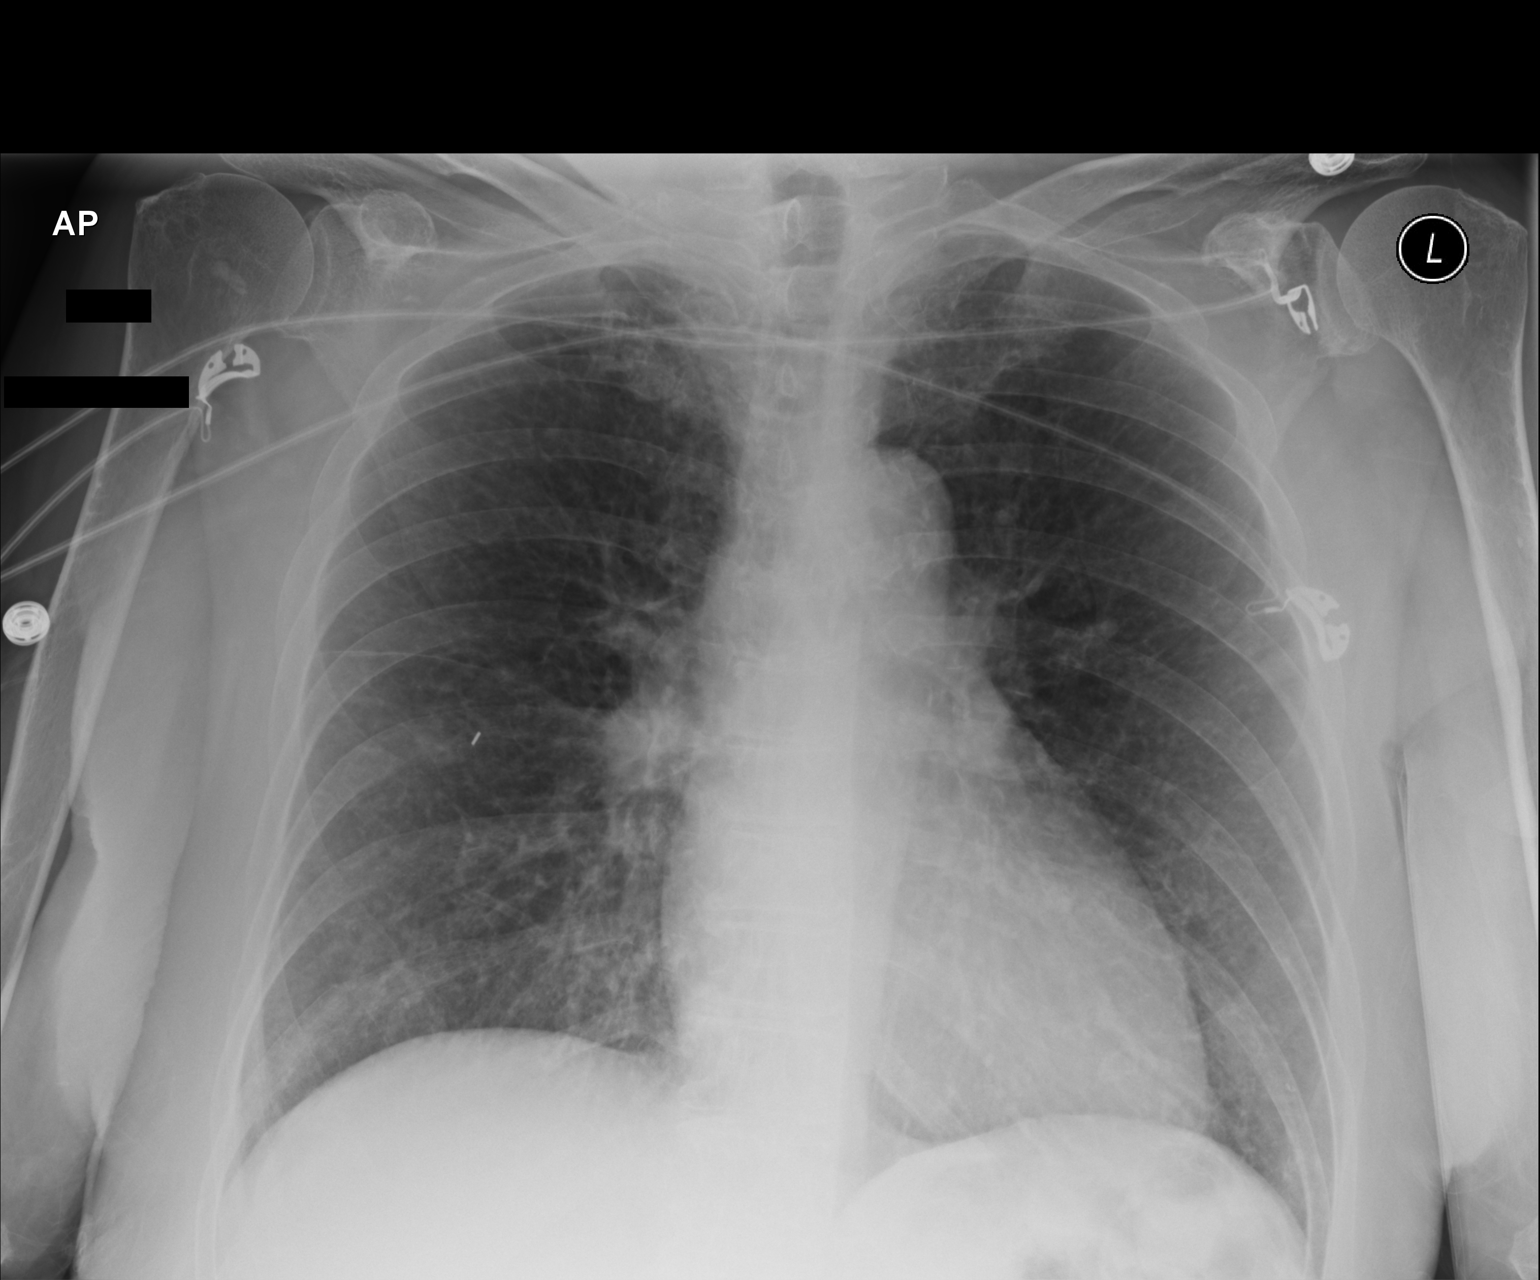

[2 of 2 positions shown; findings below may reference images not displayed]

FINDINGS: Heart is normal size. Minimal right infrahilar airspace disease.
This is likely related to bronchoscopy. No pneumothorax. No
effusions. Left lung clear.
IMPRESSION: No pneumothorax following bronchoscopy.

## 2021-11-11 SURGERY — BRONCHOSCOPY, WITH BIOPSY USING ELECTROMAGNETIC NAVIGATION
Anesthesia: General

## 2021-11-11 MED ORDER — CHLORHEXIDINE GLUCONATE 0.12 % MT SOLN
15.0000 mL | Freq: Once | OROMUCOSAL | Status: AC
  Administered 2021-11-11: 07:00:00 15 mL via OROMUCOSAL
  Filled 2021-11-11: qty 15

## 2021-11-11 MED ORDER — FENTANYL CITRATE (PF) 250 MCG/5ML IJ SOLN
INTRAMUSCULAR | Status: DC | PRN
  Administered 2021-11-11: 50 ug via INTRAVENOUS
  Administered 2021-11-11: 100 ug via INTRAVENOUS

## 2021-11-11 MED ORDER — AMISULPRIDE (ANTIEMETIC) 5 MG/2ML IV SOLN
10.0000 mg | Freq: Once | INTRAVENOUS | Status: AC | PRN
  Administered 2021-11-11: 10:00:00 10 mg via INTRAVENOUS

## 2021-11-11 MED ORDER — SUGAMMADEX SODIUM 200 MG/2ML IV SOLN
INTRAVENOUS | Status: DC | PRN
Start: 2021-11-11 — End: 2021-11-11
  Administered 2021-11-11: 180 mg via INTRAVENOUS

## 2021-11-11 MED ORDER — OXYCODONE HCL 5 MG PO TABS: 5.0000 mg | ORAL_TABLET | Freq: Once | ORAL | Status: DC | PRN

## 2021-11-11 MED ORDER — PROMETHAZINE HCL 25 MG/ML IJ SOLN: 6.2500 mg | INTRAMUSCULAR | Status: DC | PRN

## 2021-11-11 MED ORDER — MIDAZOLAM HCL 2 MG/2ML IJ SOLN
INTRAMUSCULAR | Status: DC | PRN
  Administered 2021-11-11: 2 mg via INTRAVENOUS

## 2021-11-11 MED ORDER — ONDANSETRON HCL 4 MG/2ML IJ SOLN
INTRAMUSCULAR | Status: DC | PRN
  Administered 2021-11-11: 4 mg via INTRAVENOUS

## 2021-11-11 MED ORDER — ROCURONIUM BROMIDE 10 MG/ML (PF) SYRINGE
PREFILLED_SYRINGE | INTRAVENOUS | Status: DC | PRN
  Administered 2021-11-11: 60 mg via INTRAVENOUS

## 2021-11-11 MED ORDER — PHENYLEPHRINE 40 MCG/ML (10ML) SYRINGE FOR IV PUSH (FOR BLOOD PRESSURE SUPPORT)
PREFILLED_SYRINGE | INTRAVENOUS | Status: DC | PRN
  Administered 2021-11-11: 120 ug via INTRAVENOUS

## 2021-11-11 MED ORDER — AMISULPRIDE (ANTIEMETIC) 5 MG/2ML IV SOLN
INTRAVENOUS | Status: AC
  Filled 2021-11-11: qty 4

## 2021-11-11 MED ORDER — PROPOFOL 10 MG/ML IV BOLUS
INTRAVENOUS | Status: DC | PRN
  Administered 2021-11-11: 120 mg via INTRAVENOUS

## 2021-11-11 MED ORDER — LIDOCAINE 2% (20 MG/ML) 5 ML SYRINGE
INTRAMUSCULAR | Status: DC | PRN
  Administered 2021-11-11: 60 mg via INTRAVENOUS

## 2021-11-11 MED ORDER — PHENYLEPHRINE HCL-NACL 20-0.9 MG/250ML-% IV SOLN
INTRAVENOUS | Status: DC | PRN
  Administered 2021-11-11: 25 ug/min via INTRAVENOUS

## 2021-11-11 MED ORDER — LACTATED RINGERS IV SOLN: INTRAVENOUS | Status: DC

## 2021-11-11 MED ORDER — DEXAMETHASONE SODIUM PHOSPHATE 10 MG/ML IJ SOLN
INTRAMUSCULAR | Status: DC | PRN
  Administered 2021-11-11: 10 mg via INTRAVENOUS

## 2021-11-11 MED ORDER — OXYCODONE HCL 5 MG/5ML PO SOLN: 5.0000 mg | Freq: Once | ORAL | Status: DC | PRN

## 2021-11-11 MED ORDER — HYDROMORPHONE HCL 1 MG/ML IJ SOLN
0.2500 mg | INTRAMUSCULAR | Status: DC | PRN
Start: 2021-11-11 — End: 2021-11-11

## 2021-11-11 SURGICAL SUPPLY — 1 items: superlock fiducial marker ×2 IMPLANT

## 2021-11-11 NOTE — Discharge Instructions (Signed)
Flexible Bronchoscopy, Care After This sheet gives you information about how to care for yourself after your test. Your doctor may also give you more specific instructions. If you have problems or questions, contact your doctor. Follow these instructions at home: Eating and drinking Do not eat or drink anything (not even water) for 2 hours after your test, or until your numbing medicine (local anesthetic) wears off. When your numbness is gone and your cough and gag reflexes have come back, you may: Eat only soft foods. Slowly drink liquids. The day after the test, go back to your normal diet. Driving Do not drive for 24 hours if you were given a medicine to help you relax (sedative). Do not drive or use heavy machinery while taking prescription pain medicine. General instructions  Take over-the-counter and prescription medicines only as told by your doctor. Return to your normal activities as told. Ask what activities are safe for you. Do not use any products that have nicotine or tobacco in them. This includes cigarettes and e-cigarettes. If you need help quitting, ask your doctor. Keep all follow-up visits as told by your doctor. This is important. It is very important if you had a tissue sample (biopsy) taken. Get help right away if: You have shortness of breath that gets worse. You get light-headed. You feel like you are going to pass out (faint). You have chest pain. You cough up: More than a little blood. More blood than before. Summary Do not eat or drink anything (not even water) for 2 hours after your test, or until your numbing medicine wears off. Do not use cigarettes. Do not use e-cigarettes. Get help right away if you have chest pain.  Please call our office for any questions or concerns.  [phone].  This information is not intended to replace advice given to you by your health care provider. Make sure you discuss any questions you have with your health care  provider. Document Released: 09/04/2009 Document Revised: 10/20/2017 Document Reviewed: 11/25/2016 Elsevier Patient Education  2020 [employer].

## 2021-11-11 NOTE — Op Note (Signed)
Video Bronchoscopy with Robotic Assisted Bronchoscopic Navigation   Date of Operation: 11/11/2021   Pre-op Diagnosis: Right lower lobe mass, right middle lobe nodule  Post-op Diagnosis: Right lower lobe mass, right upper lobe nodule  Surgeon: Baltazar Apo  Assistants: None  Anesthesia: General endotracheal anesthesia  Operation: Flexible video fiberoptic bronchoscopy with robotic assistance and biopsies.  Estimated Blood Loss: Minimal  Complications: None  Indications and History: Elizabeth Wu is a 70 y.o. female with history of tobacco use and newly diagnosed breast cancer.  PET scan showed hypermetabolic right lower lobe mass,.  Hypermetabolic right lower lobe nodule.  Recommendation was made to achieve tissue diagnosis with Navigational bronchoscopy. The risks, benefits, complications, treatment options and expected outcomes were discussed with the patient.  The possibilities of pneumothorax, pneumonia, reaction to medication, pulmonary aspiration, perforation of a viscus, bleeding, failure to diagnose a condition and creating a complication requiring transfusion or operation were discussed with the patient who freely signed the consent.    Description of Procedure: The patient was seen in the Preoperative Area, was examined and was deemed appropriate to proceed.  The patient was taken to [facility] endoscopy room 3, identified as Elizabeth Wu and the procedure verified as Flexible Video Fiberoptic Bronchoscopy.  A Time Out was held and the above information confirmed.   Prior to the date of the procedure a high-resolution CT scan of the chest was performed. Utilizing ION software program a virtual tracheobronchial tree was generated to allow the creation of distinct navigation pathways to the patient's parenchymal abnormalities. After being taken to the operating room general anesthesia was initiated and the patient  was orally intubated. The video fiberoptic bronchoscope was introduced via  the endotracheal tube and a general inspection was performed which showed normal right and left lung anatomy, aspiration of the bilateral mainstems was completed to remove any remaining secretions. Robotic catheter inserted into patient's endotracheal tube.   Target #1 right lower lobe mass: The distinct navigation pathways prepared prior to this procedure were then utilized to navigate to patient's lesion identified on CT scan. The robotic catheter was secured into place and the vision probe was withdrawn.  Lesion location was approximated using fluoroscopy and radial endobronchial ultrasound for peripheral targeting. Under fluoroscopic guidance transbronchial needle brushings, transbronchial needle biopsies, and transbronchial forceps biopsies were performed to be sent for cytology and pathology.   Target #2 right upper lobe nodule: The distinct navigation pathways prepared prior to this procedure were then utilized to navigate to patient's lesion identified on CT scan.  After navigation planning it was determined that the presumed right middle lobe nodule was actually at the very inferior aspect of the right upper lobe adjacent to the minor fissure.  The robotic catheter was secured into place and the vision probe was withdrawn.  Lesion location was approximated using fluoroscopy and radial endobronchial ultrasound for peripheral targeting.  Local registration and targeting was performed using Cios three-dimensional imaging.  Under fluoroscopic guidance transbronchial needle brushings, transbronchial needle biopsies, and transbronchial forceps biopsies were performed to be sent for cytology and pathology. A single fiducial marker was placed adjacent to the pulmonary nodule under fluoroscopic guidance.  At the end of the procedure a general airway inspection was performed and there was no evidence of active bleeding. The bronchoscope was removed.  The patient tolerated the procedure well. There was no  significant blood loss and there were no obvious complications. A post-procedural chest x-ray is pending.  Samples Target #1: 1. Transbronchial needle  brushings from right lower lobe mass 2. Transbronchial Wang needle biopsies from right lower lobe mass 3. Transbronchial forceps biopsies from right lower lobe mass  Samples Target #2: 1. Transbronchial needle brushings from right upper lobe nodule 2. Transbronchial Wang needle biopsies from right upper lobe nodule 3. Transbronchial forceps biopsies from right upper lobe nodule  Plans:  The patient will be discharged from the PACU to home when recovered from anesthesia and after chest x-ray is reviewed. We will review the cytology, pathology and microbiology results with the patient when they become available. Outpatient followup will be with Dr. Lamonte Sakai and Dr. Delton Coombes.   Baltazar Apo, MD, PhD 11/11/2021, 9:26 AM [facility] [phone] or if no answer before 7:00PM call [phone] For any issues after 7:00PM please call eLink [phone]

## 2021-11-11 NOTE — Anesthesia Postprocedure Evaluation (Signed)
Anesthesia Post Note  Patient: Elizabeth Wu  Procedure(s) Performed: ROBOTIC ASSISTED NAVIGATIONAL BRONCHOSCOPY BRONCHIAL BRUSHINGS BRONCHIAL NEEDLE ASPIRATION BIOPSIES BRONCHIAL BIOPSIES FIDUCIAL MARKER PLACEMENT     Patient location during evaluation: PACU Anesthesia Type: General Level of consciousness: awake and alert Pain management: pain level controlled Vital Signs Assessment: post-procedure vital signs reviewed and stable Respiratory status: spontaneous breathing, nonlabored ventilation, respiratory function stable and patient connected to nasal cannula oxygen Cardiovascular status: blood pressure returned to baseline and stable Postop Assessment: no apparent nausea or vomiting Anesthetic complications: no   No notable events documented.  Last Vitals:  Vitals:   11/11/21 0955 11/11/21 1010  BP: (!) 122/56 (!) 113/51  Pulse: 96 91  Resp: 15 11  Temp:  (!) 36.2 C  SpO2: 92% 92%    Last Pain:  Vitals:   11/11/21 1010  TempSrc:   PainSc: 0-No pain                 Lynda Rainwater

## 2021-11-11 NOTE — Interval H&P Note (Signed)
History and Physical Interval Note:  11/11/2021 7:25 AM  Elizabeth Wu  has presented today for surgery, with the diagnosis of lung nodule.  The various methods of treatment have been discussed with the patient and family. After consideration of risks, benefits and other options for treatment, the patient has consented to  Procedure(s): ROBOTIC ASSISTED NAVIGATIONAL BRONCHOSCOPY (N/A) as a surgical intervention.  The patient's history has been reviewed, patient examined, no change in status, stable for surgery.  I have reviewed the patient's chart and labs.  Questions were answered to the patient's satisfaction.     Collene Gobble

## 2021-11-11 NOTE — Transfer of Care (Signed)
Immediate Anesthesia Transfer of Care Note  Patient: DAYANNA PRYCE  Procedure(s) Performed: ROBOTIC ASSISTED NAVIGATIONAL BRONCHOSCOPY BRONCHIAL BRUSHINGS BRONCHIAL NEEDLE ASPIRATION BIOPSIES BRONCHIAL BIOPSIES FIDUCIAL MARKER PLACEMENT  Patient Location: PACU  Anesthesia Type:General  Level of Consciousness: awake and alert   Airway & Oxygen Therapy: Patient Spontanous Breathing and Patient connected to face mask oxygen  Post-op Assessment: Report given to RN and Post -op Vital signs reviewed and stable  Post vital signs: Reviewed and stable  Last Vitals:  Vitals Value Taken Time  BP 122/56 11/11/21 0954  Temp 36.9 C 11/11/21 0940  Pulse 97 11/11/21 0956  Resp 20 11/11/21 0956  SpO2 91 % 11/11/21 0956  Vitals shown include unvalidated device data.  Last Pain:  Vitals:   11/11/21 0940  TempSrc:   PainSc: 0-No pain      Patients Stated Pain Goal: 3 (49/67/59 1638)  Complications: No notable events documented.

## 2021-11-11 NOTE — Anesthesia Preprocedure Evaluation (Addendum)
Anesthesia Evaluation  Patient identified by MRN, date of birth, ID band Patient awake    Reviewed: Allergy & Precautions, NPO status , Patient's Chart, lab work & pertinent test results  History of Anesthesia Complications (+) PONV  Airway Mallampati: II  TM Distance: >3 FB Neck ROM: Full    Dental  (+) Edentulous Upper, Edentulous Lower   Pulmonary neg pulmonary ROS, Current Smoker and Patient abstained from smoking.,    Pulmonary exam normal breath sounds clear to auscultation       Cardiovascular hypertension, Pt. on medications negative cardio ROS Normal cardiovascular exam Rhythm:Regular Rate:Normal     Neuro/Psych negative neurological ROS  negative psych ROS   GI/Hepatic negative GI ROS, Neg liver ROS,   Endo/Other  negative endocrine ROS  Renal/GU negative Renal ROS  negative genitourinary   Musculoskeletal  (+) Arthritis , Osteoarthritis,    Abdominal   Peds negative pediatric ROS (+)  Hematology negative hematology ROS (+)   Anesthesia Other Findings Metastatic Breast Cancer  Reproductive/Obstetrics negative OB ROS                            Anesthesia Physical Anesthesia Plan  ASA: 3  Anesthesia Plan: General   Post-op Pain Management:    Induction: Intravenous  PONV Risk Score and Plan: 3 and Ondansetron, Dexamethasone, Midazolam and Treatment may vary due to age or medical condition  Airway Management Planned: Oral ETT  Additional Equipment:   Intra-op Plan:   Post-operative Plan: Extubation in OR  Informed Consent: I have reviewed the patients History and Physical, chart, labs and discussed the procedure including the risks, benefits and alternatives for the proposed anesthesia with the patient or authorized representative who has indicated his/her understanding and acceptance.     Dental advisory given  Plan Discussed with: CRNA  Anesthesia Plan  Comments:         Anesthesia Quick Evaluation

## 2021-11-11 NOTE — Anesthesia Procedure Notes (Signed)
Procedure Name: Intubation Date/Time: 11/11/2021 7:43 AM Performed by: Bryson Corona, CRNA Pre-anesthesia Checklist: Patient identified, Emergency Drugs available, Suction available and Patient being monitored Patient Re-evaluated:Patient Re-evaluated prior to induction Oxygen Delivery Method: Circle System Utilized Preoxygenation: Pre-oxygenation with 100% oxygen Induction Type: IV induction Ventilation: Mask ventilation without difficulty Laryngoscope Size: Mac and 3 Grade View: Grade I Tube type: Oral Tube size: 8.5 mm Number of attempts: 1 Airway Equipment and Method: Stylet Placement Confirmation: ETT inserted through vocal cords under direct vision, positive ETCO2 and breath sounds checked- equal and bilateral Secured at: 21 cm Tube secured with: Tape Dental Injury: Teeth and Oropharynx as per pre-operative assessment

## 2021-11-15 ENCOUNTER — Encounter (HOSPITAL_COMMUNITY): Payer: Self-pay | Admitting: Emergency Medicine

## 2021-11-16 ENCOUNTER — Ambulatory Visit (INDEPENDENT_AMBULATORY_CARE_PROVIDER_SITE_OTHER): Payer: Medicare HMO

## 2021-11-16 ENCOUNTER — Telehealth: Payer: Self-pay | Admitting: Emergency Medicine

## 2021-11-16 DIAGNOSIS — E538 Deficiency of other specified B group vitamins: Secondary | ICD-10-CM | POA: Diagnosis not present

## 2021-11-16 LAB — SURGICAL PATHOLOGY

## 2021-11-16 MED ORDER — CYANOCOBALAMIN 1000 MCG/ML IJ SOLN
1000.0000 ug | Freq: Once | INTRAMUSCULAR | Status: AC
  Administered 2021-11-16: 15:00:00 1000 ug via INTRAMUSCULAR

## 2021-11-16 NOTE — Progress Notes (Signed)
Cyanocobalamin injection given to left deltoid.  Patient tolerated well. 

## 2021-11-16 NOTE — Telephone Encounter (Signed)
Discussed bronchoscopy results with the patient. Shows adenoCA, consistent w lung primary. Her needle bx of skin lesion on her back shows the same.   She has an OV w Dr Delton Coombes on 12/29 to plan next steps.

## 2021-11-17 LAB — CYTOLOGY - NON PAP

## 2021-11-17 NOTE — Progress Notes (Signed)
[facility] [address],  [address]   Patient Care Team: [name], DO as PCP - General (Family Medicine) Gala Romney Cristopher Estimable, MD as Consulting Physician (Gastroenterology) Brien Mates, RN as Oncology Nurse Navigator (Oncology) Derek Jack, MD as Medical Oncologist (Medical Oncology)  SUMMARY OF ONCOLOGIC HISTORY: Oncology History   No history exists.    CHIEF COMPLIANT: Follow-up of bilateral breast cancer   INTERVAL HISTORY: [name] is a 70 y.o. female here today for follow up of her bilateral breast cancer. Her last visit was on 10/25/2021.   Today she reports feeling well. She reports pain from a lump on the left side of her head; this pain disrupts her sleep. She reports continued pain from a lump on her lower back. She has lost 7 lbs since 12/5. She reports constipation. She is drinking 2 Ensure daily. She is eating 1 small meal daily. She is taking 1 hydrocodone tablet every 6 hours. She is not taking Marinol as she believed it was causing constipation.   REVIEW OF SYSTEMS:   Review of Systems  Constitutional:  Positive for unexpected weight change (-5 lbs). Negative for appetite change (70%).  HENT:   Positive for lump/mass (left side of head and back).   Respiratory:  Positive for cough.   Gastrointestinal:  Positive for constipation and nausea.  Musculoskeletal:  Positive for arthralgias (6/10 all over) and back pain.  Neurological:  Positive for headaches.  All other systems reviewed and are negative.  I have reviewed the past medical history, past surgical history, social history and family history with the patient and they are unchanged from previous note.   ALLERGIES:   has No Known Allergies.   MEDICATIONS:  Current Outpatient Medications  Medication Sig Dispense Refill   cholecalciferol (VITAMIN D3) 25 MCG (1000 UNIT) tablet Take 1,000 Units by mouth daily.     cyanocobalamin (,VITAMIN B-12,)  1000 MCG/ML injection Inject 1,000 mcg into the muscle every 30 (thirty) days.     Ensure (ENSURE) Take 237 mLs by mouth 2 (two) times daily between meals.     folic acid (FOLVITE) 1 MG tablet Take 1 mg by mouth daily.     HYDROcodone-acetaminophen (NORCO/VICODIN) 5-325 MG tablet Take 1 tablet by mouth every 6 (six) hours as needed for moderate pain. 60 tablet 0   loperamide (IMODIUM A-D) 2 MG tablet Take 2 mg by mouth 4 (four) times daily as needed for diarrhea or loose stools.     losartan (COZAAR) 50 MG tablet Take 1 tablet (50 mg total) by mouth daily. 90 tablet 3   Omega-3 Fatty Acids (FISH OIL) 1000 MG CAPS Take 1,000 mg by mouth daily.     prochlorperazine (COMPAZINE) 10 MG tablet Take 1 tablet (10 mg total) by mouth every 6 (six) hours as needed for nausea or vomiting. 30 tablet 0   simvastatin (ZOCOR) 20 MG tablet Take 1 tablet (20 mg total) by mouth at bedtime. 90 tablet 3   ibuprofen (ADVIL) 200 MG tablet Take 400 mg by mouth every 6 (six) hours as needed for moderate pain. (Patient not taking: Reported on 11/18/2021)     No current facility-administered medications for this visit.     PHYSICAL EXAMINATION: Performance status (ECOG): 1 - Symptomatic but completely ambulatory  Vitals:   11/18/21 1440  BP: 139/78  Pulse: 82  Resp: 19  Temp: 98.2 F (36.8 C)  SpO2: 96%   Wt Readings from Last  3 Encounters:  11/18/21 130 lb (59 kg)  11/11/21 133 lb (60.3 kg)  11/09/21 133 lb (60.3 kg)   Physical Exam Vitals reviewed.  Constitutional:      Appearance: Normal appearance.     Comments: In wheelchair  HENT:     Mouth/Throat:     Comments: Thrush present on tongue Cardiovascular:     Rate and Rhythm: Normal rate and regular rhythm.     Pulses: Normal pulses.     Heart sounds: Normal heart sounds.  Pulmonary:     Effort: Pulmonary effort is normal.     Breath sounds: Normal breath sounds.  Neurological:     General: No focal deficit present.     Mental Status: She is  alert and oriented to person, place, and time.  Psychiatric:        Mood and Affect: Mood normal.        Behavior: Behavior normal.    Breast Exam Chaperone: Thana Ates     LABORATORY DATA:  I have reviewed the data as listed CMP Latest Ref Rng & Units 07/21/2021 07/06/2021 03/01/2021  Glucose 65 - 99 mg/dL 123(H) 98 95  BUN 8 - 27 mg/dL 10 11 13   Creatinine 0.57 - 1.00 mg/dL 0.82 0.78 0.91  Sodium 134 - 144 mmol/L 142 136 143  Potassium 3.5 - 5.2 mmol/L 3.2(L) 3.0(L) 4.8  Chloride 96 - 106 mmol/L 101 102 103  CO2 20 - 29 mmol/L 25 27 24   Calcium 8.7 - 10.3 mg/dL 9.9 8.8(L) 9.9  Total Protein 6.5 - 8.1 g/dL - 6.8 7.3  Total Bilirubin 0.3 - 1.2 mg/dL - 1.2 0.8  Alkaline Phos 38 - 126 U/L - 83 126(H)  AST 15 - 41 U/L - 15 15  ALT 0 - 44 U/L - 13 15   No results found for: JDB520 Lab Results  Component Value Date   WBC 8.6 11/09/2021   HGB 12.6 11/09/2021   HCT 37.1 11/09/2021   MCV 95.9 11/09/2021   PLT 392 11/09/2021   NEUTROABS 3.8 07/06/2021    ASSESSMENT:  Bilateral breast cancer: - Screening mammogram on 08/24/2021 with suspicious 0.9 cm mass in the left breast at 10 o'clock position, 1.3 cm mass at the 12 o'clock position.  Suspicious 0.7 cm mass in the right breast at 10 o'clock position. - Biopsy of the left breast 12:00 and 10 o'clock position consistent with invasive ductal carcinoma, grade 3.  Tumor cells are negative for HER2.  ER is 10% positive with weak staining.  PR is 5% positive with moderate staining.  Ki-67 is 15%.  Tissue for this case was left in formalin for 72 hours, therefore repeat prognostic panel was recommended on excision specimen. - Biopsy of the right breast 10 o'clock position shows invasive lobular carcinoma, ER 60% positive, PR 80% positive, Ki-67 2%, HER2 2+.  HER2 FISH pending. - PET scan on 10/21/2021 with a right lower lobe lung spiculated mass measuring 2.3 x 3.4 cm with SUV 8.1.  Hypermetabolic right middle lobe lung nodule measuring 9 mm  with SUV 2.7.  1.5 x 2.1 cm left breast upper outer nodule SUV 2.8.  12 mm nodule in the supra-areolar medial left breast SUV 3.1.  Subcutaneous soft tissue nodule overlying the posterior lower left ribs measuring 1.4 cm with SUV 2.8.  10 mm left omental nodule.  Soft tissue nodule in the left pericolic mesentery measures 7 mm with SUV 1.8.  Soft tissue nodule posterior to the left psoas  muscle measures 9 mm with SUV 2.1. - MRI of the lumbar spine on 09/14/2021 with 1 cm foci of low T1 and high T2 signal within L5 and S1 vertebral bodies, nonspecific, could be marrow space metastatic disease. - 25 pound weight loss since the beginning of this year due to decreased appetite and eating. - Lower back and right hip pain since February/March of this year on and off. - Soft tissue nodule on the back biopsy on 11/09/2021 shows metastatic poorly differentiated carcinoma, CK7 and TTF-1 positive, p40 negative.  Negative for CK20, GATA3, SOX10. - Bronchoscopy and biopsy of the right lower lobe and right upper lobe consistent with adenocarcinoma, TTF-1 and Napsin patchy positive, negative for p40.    Social/family history: - She lives at home with her friend.  She is retired and worked in Architect, as a Artist, and Corporate investment banker.  She is current active smoker, half to 1 pack/day for 50 to 60 years. - Father had stomach cancer.  Brother had lung cancer.   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 - We have reviewed right lower lobe lung nodule FNA, brushing and biopsies which showed adenocarcinoma with malignant cells showing patchy positivity with TTF-1 and Napsin A and negative for p40. - Recommend MRI of the brain with and without contrast to complete work-up. - Recommend NGS testing on  the skin nodule biopsy. - We discussed further management in the form of palliative therapy for metastatic lung cancer versus best supportive care in the form of hospice.  The patient elected to move forward with active therapy at this time. - Recommend port placement. - Will discuss further treatment plan once we receive the NGS test results. - If she does not have any targetable mutations, she will be offered chemoimmunotherapy with carboplatin, pemetrexed and pembrolizumab. - She is already receiving B12 injections and is taking folic acid daily. - She was told to try melatonin for sleep.  2.  Bilateral breast cancer: - We will not be doing definitive treatment given her metastatic lung cancer. - We discussed about starting her on anastrozole 1 mg daily to control it.  We discussed side effects in detail.  We have sent a prescription to her pharmacy.  3.  Low back pain and right hip pain: - She is taking hydrocodone 5/325 every 6 hours. - She is constipated.  We have given stool regimen and told her to start Colace daily. - Will increase hydrocodone to 10/325 mg every 6 hours.  4.  Weight loss/loss of appetite: - She has not started Marinol 2.5 mg twice daily.  She had to pay $90 co-pay. - She has lost further weight of 7 pounds since last visit. - We will start her on Megace 400 mg twice daily. - She is drinking 2 cans of boost daily.  She is eating 1 small meal per day.  We will consider dietary evaluation. - Continue Compazine as needed for nausea.  5.  Oral thrush: - We will start her on Diflucan 200 mg loading dose followed by 100 mg daily for 6 days.  Breast Cancer therapy associated bone loss: I have recommended calcium, Vitamin D and weight bearing exercises.  Orders placed this encounter:  No orders of the defined types were placed in this encounter.  The patient has a good understanding of the overall plan. She agrees with it. She will call with any problems that may  develop before the next visit here.  Derek Jack, MD [place] [phone]   I, Thana Ates, am acting as a scribe for Dr. Derek Jack.  I, Derek Jack MD, have reviewed the above documentation for accuracy and completeness, and I agree with the above.

## 2021-11-18 ENCOUNTER — Other Ambulatory Visit: Payer: Self-pay

## 2021-11-18 ENCOUNTER — Other Ambulatory Visit (HOSPITAL_COMMUNITY): Payer: Self-pay

## 2021-11-18 ENCOUNTER — Inpatient Hospital Stay (HOSPITAL_COMMUNITY): Payer: Medicare HMO | Admitting: Hematology

## 2021-11-18 VITALS — BP 139/78 | HR 82 | Temp 98.2°F | Resp 19 | Ht 64.0 in | Wt 130.0 lb

## 2021-11-18 DIAGNOSIS — G893 Neoplasm related pain (acute) (chronic): Secondary | ICD-10-CM | POA: Diagnosis not present

## 2021-11-18 DIAGNOSIS — Z170 Estrogen receptor positive status [ER+]: Secondary | ICD-10-CM | POA: Diagnosis not present

## 2021-11-18 DIAGNOSIS — C786 Secondary malignant neoplasm of retroperitoneum and peritoneum: Secondary | ICD-10-CM | POA: Diagnosis not present

## 2021-11-18 DIAGNOSIS — C50411 Malignant neoplasm of upper-outer quadrant of right female breast: Secondary | ICD-10-CM

## 2021-11-18 DIAGNOSIS — C3490 Malignant neoplasm of unspecified part of unspecified bronchus or lung: Secondary | ICD-10-CM | POA: Diagnosis not present

## 2021-11-18 DIAGNOSIS — C50412 Malignant neoplasm of upper-outer quadrant of left female breast: Secondary | ICD-10-CM | POA: Diagnosis not present

## 2021-11-18 DIAGNOSIS — C50212 Malignant neoplasm of upper-inner quadrant of left female breast: Secondary | ICD-10-CM | POA: Diagnosis not present

## 2021-11-18 DIAGNOSIS — Z79891 Long term (current) use of opiate analgesic: Secondary | ICD-10-CM | POA: Diagnosis not present

## 2021-11-18 DIAGNOSIS — R634 Abnormal weight loss: Secondary | ICD-10-CM | POA: Diagnosis not present

## 2021-11-18 DIAGNOSIS — C771 Secondary and unspecified malignant neoplasm of intrathoracic lymph nodes: Secondary | ICD-10-CM | POA: Diagnosis not present

## 2021-11-18 DIAGNOSIS — C7800 Secondary malignant neoplasm of unspecified lung: Secondary | ICD-10-CM | POA: Diagnosis not present

## 2021-11-18 MED ORDER — FLUCONAZOLE 100 MG PO TABS: 100.0000 mg | ORAL_TABLET | Freq: Every day | ORAL | 0 refills | Status: DC

## 2021-11-18 MED ORDER — HYDROCODONE-ACETAMINOPHEN 10-325 MG PO TABS: 1.0000 | ORAL_TABLET | Freq: Four times a day (QID) | ORAL | 0 refills | Status: DC | PRN

## 2021-11-18 MED ORDER — ANASTROZOLE 1 MG PO TABS: 1.0000 mg | ORAL_TABLET | Freq: Every day | ORAL | 6 refills | Status: DC

## 2021-11-18 MED ORDER — MEGESTROL ACETATE 400 MG/10ML PO SUSP: 400.0000 mg | Freq: Two times a day (BID) | ORAL | 3 refills | Status: DC

## 2021-11-18 NOTE — Patient Instructions (Addendum)
[place] at [facility] Discharge Instructions  You were seen and examined today by Dr. Delton Coombes. Dr. Delton Coombes reviewed each one of your recent pathology reports.  You have been diagnosed with 3 different types of cancer.  Breast Cancer is present in both of your breasts. One breast has Invasive Ductal while the other has the Invasive Lobular Carcinoma. These are both being fed by estrogen. Dr. Delton Coombes has recommended Anastrazole daily. This is an anti-estrogen pill taken once daily.  You have also been diagnosed with Stage IV Lung Cancer. This has spread beyond your lung and to other areas of your body, this was confirmed by biopsy on your skin lesion. This means that your cancer cannot be cured but it can be controlled with treatment. Dr. Delton Coombes has recommended Next Generation Sequencing (NGS) testing on your skin biopsy. Treatment of your lung cancer requires a Port-A-Cath.  To complete the staging of your lung cancer, you need a brain MRI. This is done for all lung cancers.  For pain control, Dr. Delton Coombes has increased your pain medication. Take two pills once every 6 hours until you run out. Dr. Delton Coombes will send in a new prescription. Dr. Delton Coombes has also recommended Megace, this is an appetite stimulant.   Thank you for choosing [place] at [facility] to provide your oncology and hematology care.  To afford each patient quality time with our provider, please arrive at least 15 minutes before your scheduled appointment time.   If you have a lab appointment with the [facility] please come in thru the Main Entrance and check in at the main information desk.  You need to re-schedule your appointment should you arrive 10 or more minutes late.  We strive to give you quality time with our providers, and arriving late affects you and other patients whose appointments are after yours.  Also, if you no show three or more times  for appointments you may be dismissed from the clinic at the providers discretion.     Again, thank you for choosing [facility].  Our hope is that these requests will decrease the amount of time that you wait before being seen by our physicians.       _____________________________________________________________  Should you have questions after your visit to [facility], please contact our office at [phone] and follow the prompts.  Our office hours are 8:00 a.m. and 4:30 p.m. Monday - Friday.  Please note that voicemails left after 4:00 p.m. may not be returned until the following business day.  We are closed weekends and major holidays.  You do have access to a nurse 24-7, just call the main number to the clinic [phone] and do not press any options, hold on the line and a nurse will answer the phone.    For prescription refill requests, have your pharmacy contact our office and allow 72 hours.    Due to Covid, you will need to wear a mask upon entering the hospital. If you do not have a mask, a mask will be given to you at the Main Entrance upon arrival. For doctor visits, patients may have 1 support person age 78 or older with them. For treatment visits, patients can not have anyone with them due to social distancing guidelines and our immunocompromised population.

## 2021-11-19 ENCOUNTER — Encounter (HOSPITAL_COMMUNITY): Payer: Self-pay

## 2021-11-19 NOTE — Progress Notes (Signed)
[employer] testing sent on [phone] per Dr. Delton Coombes

## 2021-11-21 ENCOUNTER — Other Ambulatory Visit: Payer: Self-pay | Admitting: Family Medicine

## 2021-11-21 DIAGNOSIS — I10 Essential (primary) hypertension: Secondary | ICD-10-CM

## 2021-11-23 ENCOUNTER — Encounter: Payer: Medicare HMO | Admitting: Dietician

## 2021-11-23 ENCOUNTER — Ambulatory Visit (HOSPITAL_COMMUNITY): Payer: Medicare HMO | Admitting: Hematology

## 2021-11-23 ENCOUNTER — Telehealth: Payer: Self-pay | Admitting: Dietician

## 2021-11-23 NOTE — Telephone Encounter (Signed)
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 She is currently receiving anastrozole. Further treatment plan pending NGS test results.   Past medical history includes HTN, chronic, constipation, DJD, osteopenia, HLD  Spoke with patient via telephone. She reports pain has been better over the last couple of days. Patient reports trying to eat more. She tried marinol but "that did not agree with her". Patient is now taking Megace twice daily, she hopes this one will work. Patient reports eating half [employer] roast beef sandwich last night, had the other half for breakfast this morning. She is drinking 2 Ensure Plus (350 kcal, 16 grams protein). Patient usually as one Gatorade and and a bottle of Dr. Malachi Bonds. She does not like water.    Nutrition Focused Physical Exam: unable to complete   Medications: hydrocodone, D3, Q68, folic acid, colace, megace   Labs: 12/20 labs reviewed   Anthropometrics: Weights decreased 5.5% in 3 weeks. Patient weights have decreased ~19% from her usual weight in the last 8 months; significant  Height: 5'4" Weight: 130 lb (11/18/21) UBW: 159 lb 12.8 oz (03/01/21) BMI: 22.31   NUTRITION DIAGNOSIS: Unintentional weight loss related to cancer as evidenced by reported decreased appetite and 19% (30 lb) decrease from usual weight in 8 months which is significant for time frame.    INTERVENTION:  Educated on importance of adequate calories and protein to maintain weights, strength Discussed strategies for poor appetite, encouraged pt to eat small frequent meals and snacks q2 hours - will mail handout with snack ideas and shake recipes Provided tips for increasing calories and protein - will mail handout  Continue drinking Ensure Plus/equivalent, recommend 2-3 daily - coupons provided on 12/29 at office, will mail additional coupons   Continue taking appetite stimulant per MD Contact information provided     MONITORING, EVALUATION, GOAL: Patient will tolerate increased calories and protein to promote weight gain   Next Visit: Monday January 23 in clinic

## 2021-11-25 ENCOUNTER — Other Ambulatory Visit: Payer: Self-pay | Admitting: *Deleted

## 2021-11-25 NOTE — Progress Notes (Signed)
The proposed treatment discussed in conference is for discussion purpose only and is not a binding recommendation. The patient was not been physically examined, or presented with their treatment options. Therefore, final treatment plans cannot be decided.  

## 2021-11-29 ENCOUNTER — Other Ambulatory Visit: Payer: Self-pay

## 2021-11-29 ENCOUNTER — Ambulatory Visit (HOSPITAL_COMMUNITY)
Admission: RE | Admit: 2021-11-29 | Discharge: 2021-11-29 | Disposition: A | Discharge status: Home / Self Care | Payer: Medicare HMO | Source: Ambulatory Visit | Attending: Hematology | Admitting: Hematology

## 2021-11-29 DIAGNOSIS — C3490 Malignant neoplasm of unspecified part of unspecified bronchus or lung: Secondary | ICD-10-CM | POA: Diagnosis not present

## 2021-11-29 DIAGNOSIS — Z720 Tobacco use: Secondary | ICD-10-CM | POA: Diagnosis not present

## 2021-11-29 DIAGNOSIS — G319 Degenerative disease of nervous system, unspecified: Secondary | ICD-10-CM | POA: Diagnosis not present

## 2021-11-29 DIAGNOSIS — C7989 Secondary malignant neoplasm of other specified sites: Secondary | ICD-10-CM | POA: Diagnosis not present

## 2021-11-29 DIAGNOSIS — Z801 Family history of malignant neoplasm of trachea, bronchus and lung: Secondary | ICD-10-CM | POA: Diagnosis not present

## 2021-11-29 DIAGNOSIS — C3481 Malignant neoplasm of overlapping sites of right bronchus and lung: Secondary | ICD-10-CM | POA: Diagnosis not present

## 2021-11-29 IMAGING — MR MR HEAD WO/W CM
13 of 15 series · 36 of 48 positions shown · IV contrast (7 ml Gadavist)
Comparison: PET-CT [DATE].

CLINICAL DATA: Provided history: Malignant neoplasm of unspecified
part of unspecified bronchus or lung. Non-small cell lung cancer,
staging.

EXAM:
MRI HEAD WITHOUT AND WITH CONTRAST
TECHNIQUE: Multiplanar, multiecho pulse sequences of the brain and surrounding
structures were obtained without and with intravenous contrast.
CONTRAST:  7mL GADAVIST GADOBUTROL 1 MMOL/ML IV SOLN

[Series 5: DWI · axial · 4.0mm · 0.88mm/px · z∈[-70,+69]mm · 3 of 36 slices shown (1 of 6)]
[im 1/36]
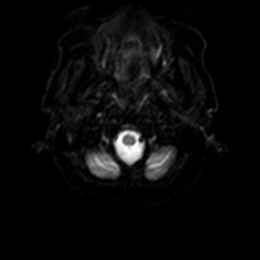
[im 18/36]
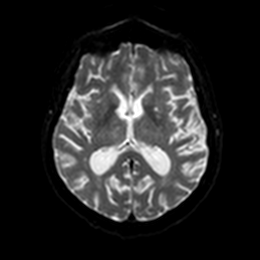
[im 36/36]
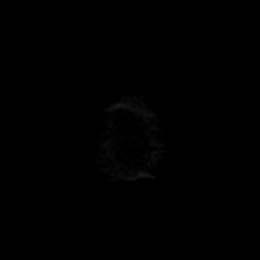

[Series 5: DWI · axial · 4.0mm · 0.88mm/px · z∈[-70,+69]mm · 3 of 36 slices shown (2 of 6)]
[im 1/36]
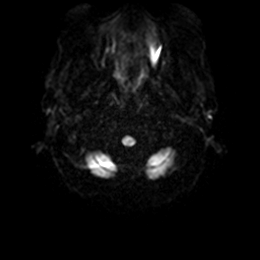
[im 18/36]
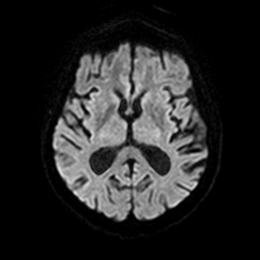
[im 36/36]
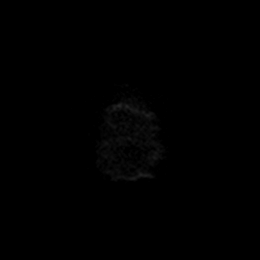

[Series 6: DWI · axial · 4.0mm · 0.88mm/px · z∈[-70,+69]mm · 4 of 36 slices shown (3 of 6)]
[im 1/36]
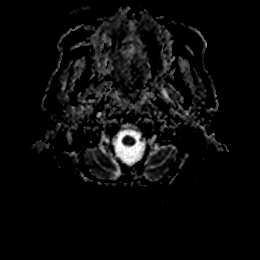
[im 12/36]
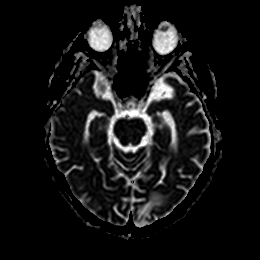
[im 24/36]
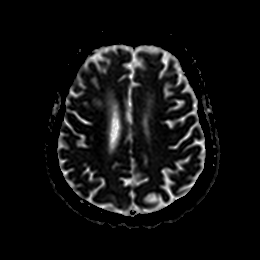
[im 36/36]
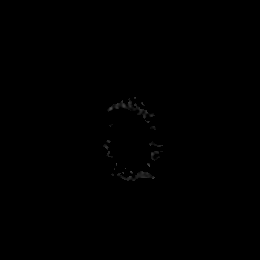

[Series 7: DWI · coronal · 5.0mm · 0.88mm/px · 3 of 28 slices shown (4 of 6)]
[im 1/28]
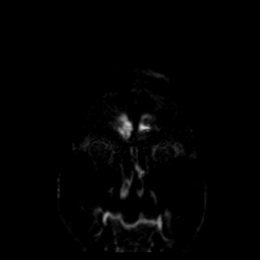
[im 14/28]
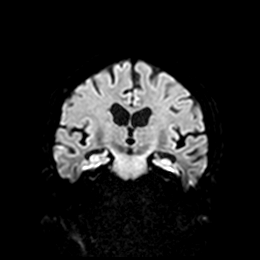
[im 28/28]
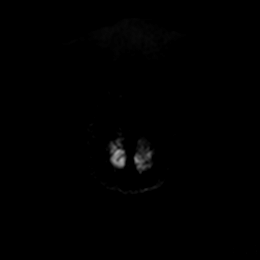

[Series 7: DWI · coronal · 5.0mm · 0.88mm/px · 3 of 28 slices shown (5 of 6)]
[im 1/28]
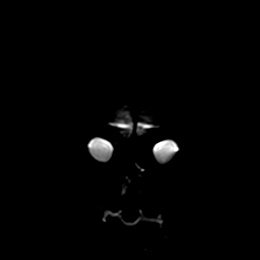
[im 14/28]
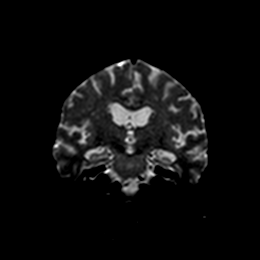
[im 28/28]
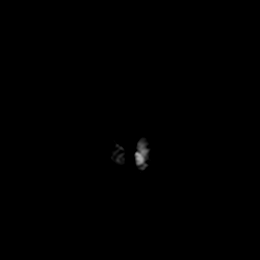

[Series 8: DWI · coronal · 5.0mm · 0.88mm/px · 3 of 28 slices shown (6 of 6)]
[im 1/28]
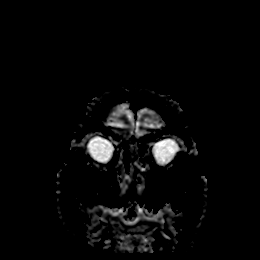
[im 14/28]
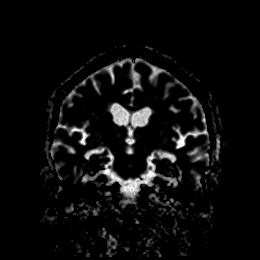
[im 28/28]
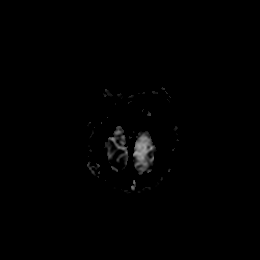

[Series 9: T1 · sagittal · 5.0mm · 0.94mm/px · 2 of 21 slices shown (1 of 2)]
[im 1/21]
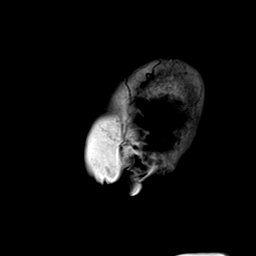
[im 21/21]
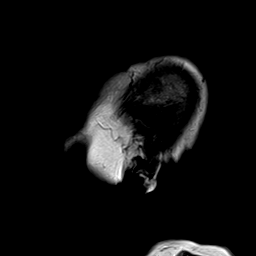

[Series 10: T2 · axial · 5.0mm · 0.72mm/px · z∈[-67,+66]mm · 2 of 20 slices shown (1 of 2)]
[im 1/20]
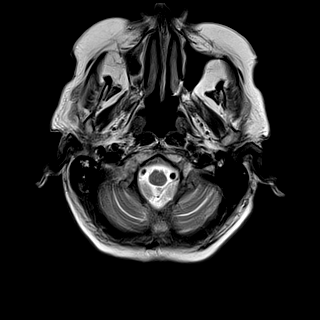
[im 20/20]
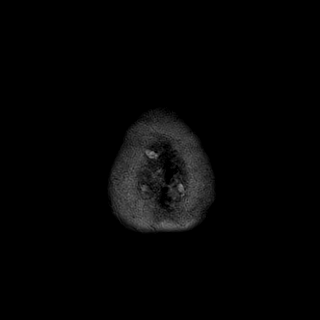

[Series 11: ax hemo · axial · 5.0mm · 0.86mm/px · z∈[-71,+72]mm · 2 of 25 slices shown]
[im 1/25]
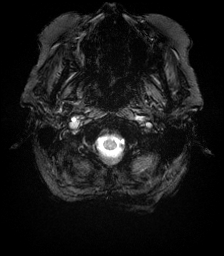
[im 25/25]
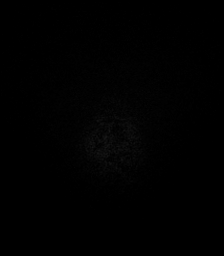

[Series 12: FLAIR · axial · 4.0mm · 0.43mm/px · z∈[-62,+62]mm · 3 of 32 slices shown]
[im 1/32]
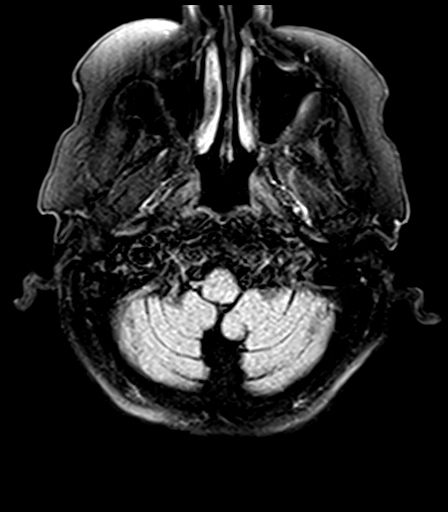
[im 16/32]
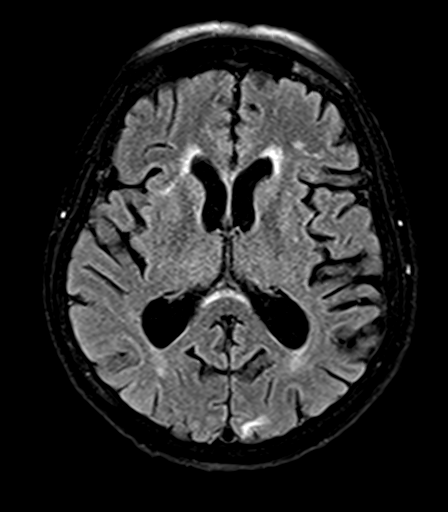
[im 32/32]
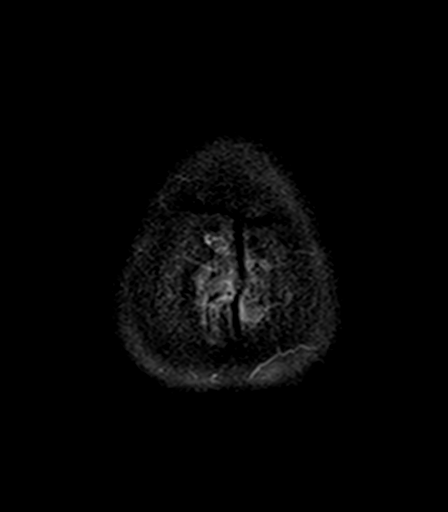

[Series 14: T2 · coronal · 5.0mm · 0.72mm/px · 3 of 28 slices shown (2 of 2)]
[im 1/28]
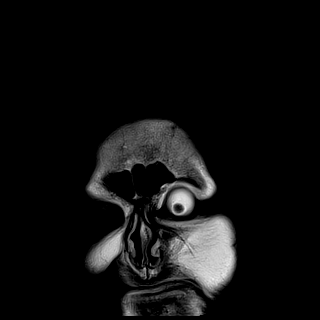
[im 14/28]
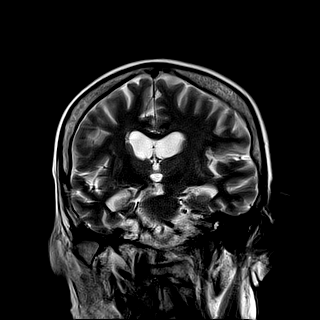
[im 28/28]
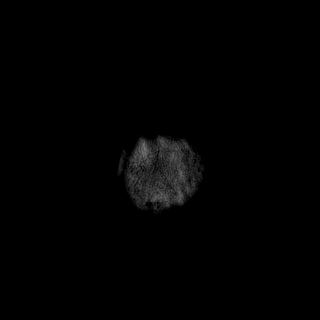

[Series 16: T1 post-contrast · coronal · 5.0mm · 0.34mm/px · 3 of 29 slices shown]
[im 1/29]
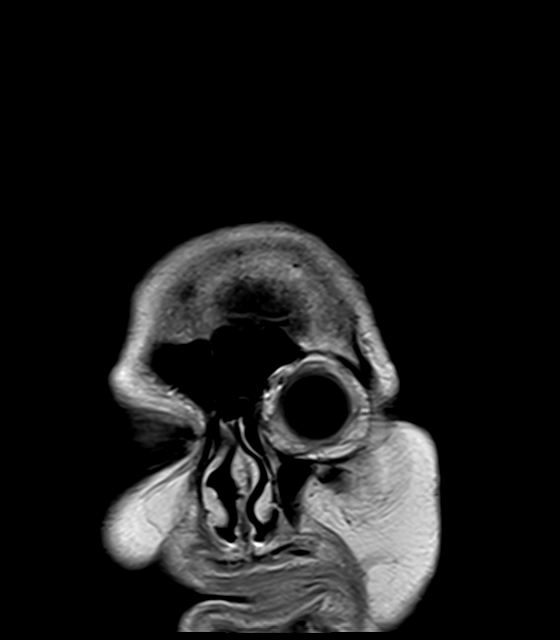
[im 15/29]
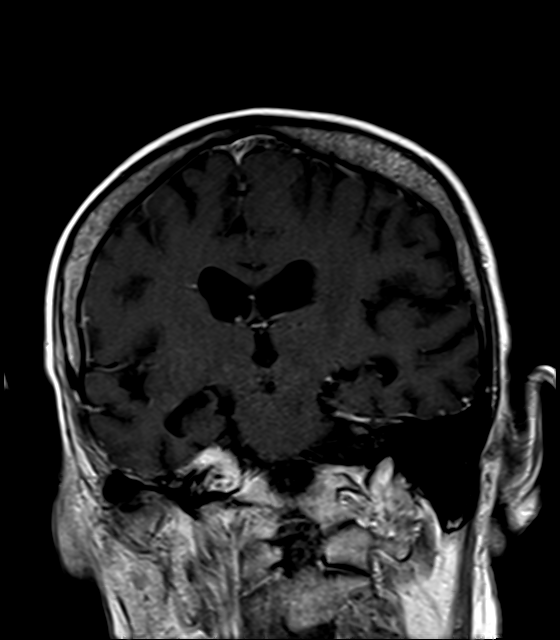
[im 29/29]
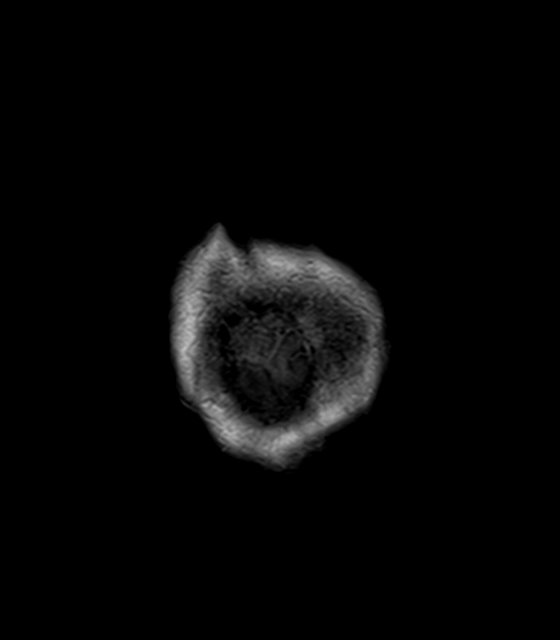

[Series 17: T1 · sagittal · 5.0mm · 0.94mm/px · 2 of 25 slices shown (2 of 2)]
[im 1/25]
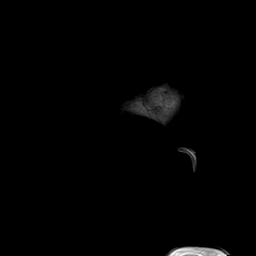
[im 25/25]
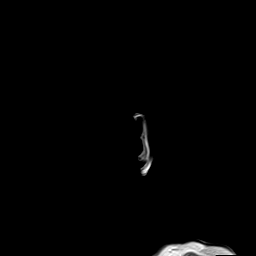

[36 of 48 positions shown; findings below may reference images not displayed]

FINDINGS: Mild-to-moderate intermittent motion degradation.

Brain:

Mild generalized cerebral and cerebellar atrophy.

Small chronic cortical/subcortical infarct within the left occipital
lobe (left PCA vascular territory).

Elsewhere, there is mild multifocal T2 FLAIR hyperintense signal
abnormality within the cerebral white matter and pons, nonspecific
but compatible with chronic small vessel ischemic disease.

Small chronic lacunar infarcts within the bilateral basal ganglia.

4 mm nodular enhancing focus along the left postcentral gyrus
(series 15, image 42) (series 16, image 11).

There is no acute infarct.

No chronic intracranial blood products.

No extra-axial fluid collection.

No midline shift.

Vascular: Maintained flow voids within the proximal large arterial
vessels.

Skull and upper cervical spine: 10 mm enhancing focus within the
left parietal calvarium (series 15, image 51). Incompletely assessed
cervical spondylosis.

Sinuses/Orbits: Visualized orbits show no acute finding. No
significant paranasal sinus disease.

Other: Trace fluid within the bilateral mastoid air cells.

Impression #2 and #3 will be called to the ordering clinician or
representative by the Radiologist Assistant, and communication
documented in the PACS or [REDACTED].
IMPRESSION: 1. Intermittently motion degraded exam.
2. 4 mm nodular enhancing focus along the left postcentral gyrus,
highly suspicious for a solitary intracranial metastasis.
3. 10 mm enhancing focus within the left parietal calvarium.
Although nonspecific, osseous metastatic disease cannot be excluded.
Attention recommended on follow-up.
4. Small chronic cortical/subcortical infarct within the left
occipital lobe.
5. Chronic small vessel ischemic disease, as described.
6. Mild generalized parenchymal atrophy.
7. Trace fluid within the bilateral mastoid air cells.

## 2021-11-29 MED ORDER — GADOBUTROL 1 MMOL/ML IV SOLN
7.0000 mL | Freq: Once | INTRAVENOUS | Status: AC | PRN
  Administered 2021-11-29: 7 mL via INTRAVENOUS

## 2021-11-30 ENCOUNTER — Encounter (HOSPITAL_COMMUNITY): Payer: Self-pay

## 2021-11-30 NOTE — Progress Notes (Signed)
[place] at [facility] called with report on MRI brain W Wo Contrast concerning impression 2 and 4. Printed and given to Dr. Delton Coombes.  Per Dr. Delton Coombes no further action at this time and he will discuss this at her appointment on 12/13/21.

## 2021-11-30 NOTE — Patient Instructions (Signed)
Elizabeth Wu  11/30/2021     @PREFPERIOPPHARMACY @   Your procedure is scheduled on  12/06/2021.   Report to [facility] at  0900 A.M.   Call this number if you have problems the morning of surgery:  [phone]   Remember:  Do not eat or drink after midnight.      Take these medicines the morning of surgery with A SIP OF WATER                         armidex, hydrocodone (if needed).     Do not wear jewelry, make-up or nail polish.  Do not wear lotions, powders, or perfumes, or deodorant.  Do not shave 48 hours prior to surgery.  Men may shave face and neck.  Do not bring valuables to the hospital.  [facility] is not responsible for any belongings or valuables.  Contacts, dentures or bridgework may not be worn into surgery.  Leave your suitcase in the car.  After surgery it may be brought to your room.  For patients admitted to the hospital, discharge time will be determined by your treatment team.  Patients discharged the day of surgery will not be allowed to drive home and must have someone with them for 24 hours.    Special instructions:   DO NOT smoke tobacco or vape for 24 hours before your procedure.  Please read over the following fact sheets that you were given. Coughing and Deep Breathing, Surgical Site Infection Prevention, Anesthesia Post-op Instructions, and Care and Recovery After Surgery      Implanted [facility] Guide An implanted port is a device that is placed under the skin. It is usually placed in the chest. The device may vary based on the need. Implanted ports can be used to give IV medicine, to take blood, or to give fluids. You may have an implanted port if: You need IV medicine that would be irritating to the small veins in your hands or arms. You need IV medicines, such as chemotherapy, for a long period of time. You need IV nutrition for a long period of time. You may have fewer limitations when using a port than you would  if you used other types of long-term IVs. You will also likely be able to return to normal activities after your incision heals. An implanted port has two main parts: Reservoir. The reservoir is the part where a needle is inserted to give medicines or draw blood. The reservoir is round. After the port is placed, it appears as a small, raised area under your skin. Catheter. The catheter is a small, thin tube that connects the reservoir to a vein. Medicine that is inserted into the reservoir goes into the catheter and then into the vein. How is my port accessed? To access your port: A numbing cream may be placed on the skin over the port site. Your health care provider will put on a mask and sterile gloves. The skin over your port will be cleaned carefully with a germ-killing soap and allowed to dry. Your health care provider will gently pinch the port and insert a needle into it. Your health care provider will check for a blood return to make sure the port is in the vein and is still working (patent). If your port needs to remain accessed to get medicine continuously (constant infusion), your health care provider will place  a clear bandage (dressing) over the needle site. The dressing and needle will need to be changed every week, or as told by your health care provider. What is flushing? Flushing helps keep the port working. Follow instructions from your health care provider about how and when to flush the port. Ports are usually flushed with saline solution or a medicine called heparin. The need for flushing will depend on how the port is used: If the port is only used from time to time to give medicines or draw blood, the port may need to be flushed: Before and after medicines have been given. Before and after blood has been drawn. As part of routine maintenance. Flushing may be recommended every 4-6 weeks. If a constant infusion is running, the port may not need to be flushed. Throw away any  syringes in a disposal container that is meant for sharp items (sharps container). You can buy a sharps container from a pharmacy, or you can make one by using an empty hard plastic bottle with a cover. How long will my port stay implanted? The port can stay in for as long as your health care provider thinks it is needed. When it is time for the port to come out, a surgery will be done to remove it. The surgery will be similar to the procedure that was done to put the port in. Follow these instructions at home: Caring for your port and port site Flush your port as told by your health care provider. If you need an infusion over several days, follow instructions from your health care provider about how to take care of your port site. Make sure you: Change your dressing as told by your health care provider. Wash your hands with soap and water for at least 20 seconds before and after you change your dressing. If soap and water are not available, use alcohol-based hand sanitizer. Place any used dressings or infusion bags into a plastic bag. Throw that bag in the trash. Keep the dressing that covers the needle clean and dry. Do not get it wet. Do not use scissors or sharp objects near the infusion tubing. Keep any external tubes clamped, unless they are being used. Check your port site every day for signs of infection. Check for: Redness, swelling, or pain. Fluid or blood. Warmth. Pus or a bad smell. Protect the skin around the port site. Avoid wearing bra straps that rub or irritate the site. Protect the skin around your port from seat belts. Place a soft pad over your chest if needed. Bathe or shower as told by your health care provider. The site may get wet as long as you are not actively receiving an infusion. General instructions  Return to your normal activities as told by your health care provider. Ask your health care provider what activities are safe for you. Carry a medical alert card or  wear a medical alert bracelet at all times. This will let health care providers know that you have an implanted port in case of an emergency. Where to find more information American Cancer Society: www.cancer.[place] [employer] On[employer]: www.cancer.net Contact a health care provider if: You have a fever or chills. You have redness, swelling, or pain at the port site. You have fluid or blood coming from your port site. Your incision feels warm to the touch. You have pus or a bad smell coming from the port site. Summary Implanted ports are usually placed in the chest for long-term  IV access. Follow instructions from your health care provider about flushing the port and changing bandages (dressings). Take care of the area around your port by avoiding clothing that puts pressure on the area, and by watching for signs of infection. Protect the skin around your port from seat belts. Place a soft pad over your chest if needed. Contact a health care provider if you have a fever or you have redness, swelling, pain, fluid, or a bad smell at the port site. This information is not intended to replace advice given to you by your health care provider. Make sure you discuss any questions you have with your health care provider. Document Revised: 05/11/2021 Document Reviewed: 05/11/2021 Elsevier Patient Education  2022 [place] Insertion, Care After The following information offers guidance on how to care for yourself after your procedure. Your health care provider may also give you more specific instructions. If you have problems or questions, contact your health care provider. What can I expect after the procedure? After the procedure, it is common to have: Discomfort at the port insertion site. Bruising on the skin over the port. This should improve over 3-4 days. Follow these instructions at home: [facility] care After your port is placed, you will get a manufacturer's  information card. The card has information about your port. Keep this card with you at all times. Take care of the port as told by your health care provider. Ask your health care provider if you or a family member can get training for taking care of the port at home. A home health care nurse will be be available to help care for the port. Make sure to remember what type of port you have. Incision care   Follow instructions from your health care provider about how to take care of your port insertion site. Make sure you: Wash your hands with soap and water for at least 20 seconds before and after you change your bandage (dressing). If soap and water are not available, use hand sanitizer. Change your dressing as told by your health care provider. Leave stitches (sutures), skin glue, or adhesive strips in place. These skin closures may need to stay in place for 2 weeks or longer. If adhesive strip edges start to loosen and curl up, you may trim the loose edges. Do not remove adhesive strips completely unless your health care provider tells you to do that. Check your port insertion site every day for signs of infection. Check for: Redness, swelling, or pain. Fluid or blood. Warmth. Pus or a bad smell. Activity Return to your normal activities as told by your health care provider. Ask your health care provider what activities are safe for you. You may have to avoid lifting. Ask your health care provider how much you can safely lift. General instructions Take over-the-counter and prescription medicines only as told by your health care provider. Do not take baths, swim, or use a hot tub until your health care provider approves. Ask your health care provider if you may take showers. You may only be allowed to take sponge baths. If you were given a sedative during the procedure, it can affect you for several hours. Do not drive or operate machinery until your health care provider says that it is  safe. Wear a medical alert bracelet in case of an emergency. This will tell any health care providers that you have a port. Keep all follow-up visits. This is important. Contact a health care provider if:  You cannot flush your port with saline as directed, or you cannot draw blood from the port. You have a fever or chills. You have redness, swelling, or pain around your port insertion site. You have fluid or blood coming from your port insertion site. Your port insertion site feels warm to the touch. You have pus or a bad smell coming from the port insertion site. Get help right away if: You have chest pain or shortness of breath. You have bleeding from your port that you cannot control. These symptoms may be an emergency. Get help right away. Call 911. Do not wait to see if the symptoms will go away. Do not drive yourself to the hospital. Summary Take care of the port as told by your health care provider. Keep the manufacturer's information card with you at all times. Change your dressing as told by your health care provider. Contact a health care provider if you have a fever or chills or if you have redness, swelling, or pain around your port insertion site. Keep all follow-up visits. This information is not intended to replace advice given to you by your health care provider. Make sure you discuss any questions you have with your health care provider. Document Revised: 05/11/2021 Document Reviewed: 05/11/2021 Elsevier Patient Education  [place] After This sheet gives you information about how to care for yourself after your procedure. Your health care provider may also give you more specific instructions. If you have problems or questions, contact your health care provider. What can I expect after the procedure? After the procedure, it is common to have: Tiredness. Forgetfulness about what happened after the procedure. Impaired judgment for  important decisions. Nausea or vomiting. Some difficulty with balance. Follow these instructions at home: For the time period you were told by your health care provider:   Rest as needed. Do not participate in activities where you could fall or become injured. Do not drive or use machinery. Do not drink alcohol. Do not take sleeping pills or medicines that cause drowsiness. Do not make important decisions or sign legal documents. Do not take care of children on your own. Eating and drinking Follow the diet that is recommended by your health care provider. Drink enough fluid to keep your urine pale yellow. If you vomit: Drink water, juice, or soup when you can drink without vomiting. Make sure you have little or no nausea before eating solid foods. General instructions Have a responsible adult stay with you for the time you are told. It is important to have someone help care for you until you are awake and alert. Take over-the-counter and prescription medicines only as told by your health care provider. If you have sleep apnea, surgery and certain medicines can increase your risk for breathing problems. Follow instructions from your health care provider about wearing your sleep device: Anytime you are sleeping, including during daytime naps. While taking prescription pain medicines, sleeping medicines, or medicines that make you drowsy. Avoid smoking. Keep all follow-up visits as told by your health care provider. This is important. Contact a health care provider if: You keep feeling nauseous or you keep vomiting. You feel light-headed. You are still sleepy or having trouble with balance after 24 hours. You develop a rash. You have a fever. You have redness or swelling around the IV site. Get help right away if: You have trouble breathing. You have new-onset confusion at home. Summary For several hours after your procedure, you  may feel tired. You may also be forgetful and have  poor judgment. Have a responsible adult stay with you for the time you are told. It is important to have someone help care for you until you are awake and alert. Rest as told. Do not drive or operate machinery. Do not drink alcohol or take sleeping pills. Get help right away if you have trouble breathing, or if you suddenly become confused. This information is not intended to replace advice given to you by your health care provider. Make sure you discuss any questions you have with your health care provider. Document Revised: 07/23/2020 Document Reviewed: 10/10/2019 Elsevier Patient Education  2022 [place]. How to Use Chlorhexidine for Bathing Chlorhexidine gluconate (CHG) is a germ-killing (antiseptic) solution that is used to clean the skin. It can get rid of the bacteria that normally live on the skin and can keep them away for about 24 hours. To clean your skin with CHG, you may be given: A CHG solution to use in the shower or as part of a sponge bath. A prepackaged cloth that contains CHG. Cleaning your skin with CHG may help lower the risk for infection: While you are staying in the intensive care unit of the hospital. If you have a vascular access, such as a central line, to provide short-term or long-term access to your veins. If you have a catheter to drain urine from your bladder. If you are on a ventilator. A ventilator is a machine that helps you breathe by moving air in and out of your lungs. After surgery. What are the risks? Risks of using CHG include: A skin reaction. Hearing loss, if CHG gets in your ears and you have a perforated eardrum. Eye injury, if CHG gets in your eyes and is not rinsed out. The CHG product catching fire. Make sure that you avoid smoking and flames after applying CHG to your skin. Do not use CHG: If you have a chlorhexidine allergy or have previously reacted to chlorhexidine. On babies younger than 35 months of age. How to use CHG solution Use  CHG only as told by your health care provider, and follow the instructions on the label. Use the full amount of CHG as directed. Usually, this is one bottle. During a shower Follow these steps when using CHG solution during a shower (unless your health care provider gives you different instructions): Start the shower. Use your normal soap and shampoo to wash your face and hair. Turn off the shower or move out of the shower stream. Pour the CHG onto a clean washcloth. Do not use any type of brush or rough-edged sponge. Starting at your neck, lather your body down to your toes. Make sure you follow these instructions: If you will be having surgery, pay special attention to the part of your body where you will be having surgery. Scrub this area for at least 1 minute. Do not use CHG on your head or face. If the solution gets into your ears or eyes, rinse them well with water. Avoid your genital area. Avoid any areas of skin that have broken skin, cuts, or scrapes. Scrub your back and under your arms. Make sure to wash skin folds. Let the lather sit on your skin for 1-2 minutes or as long as told by your health care provider. Thoroughly rinse your entire body in the shower. Make sure that all body creases and crevices are rinsed well. Dry off with a clean towel. Do not put  any substances on your body afterward--such as powder, lotion, or perfume--unless you are told to do so by your health care provider. Only use lotions that are recommended by the manufacturer. Put on clean clothes or pajamas. If it is the night before your surgery, sleep in clean sheets.  During a sponge bath Follow these steps when using CHG solution during a sponge bath (unless your health care provider gives you different instructions): Use your normal soap and shampoo to wash your face and hair. Pour the CHG onto a clean washcloth. Starting at your neck, lather your body down to your toes. Make sure you follow these  instructions: If you will be having surgery, pay special attention to the part of your body where you will be having surgery. Scrub this area for at least 1 minute. Do not use CHG on your head or face. If the solution gets into your ears or eyes, rinse them well with water. Avoid your genital area. Avoid any areas of skin that have broken skin, cuts, or scrapes. Scrub your back and under your arms. Make sure to wash skin folds. Let the lather sit on your skin for 1-2 minutes or as long as told by your health care provider. Using a different clean, wet washcloth, thoroughly rinse your entire body. Make sure that all body creases and crevices are rinsed well. Dry off with a clean towel. Do not put any substances on your body afterward--such as powder, lotion, or perfume--unless you are told to do so by your health care provider. Only use lotions that are recommended by the manufacturer. Put on clean clothes or pajamas. If it is the night before your surgery, sleep in clean sheets. How to use CHG prepackaged cloths Only use CHG cloths as told by your health care provider, and follow the instructions on the label. Use the CHG cloth on clean, dry skin. Do not use the CHG cloth on your head or face unless your health care provider tells you to. When washing with the CHG cloth: Avoid your genital area. Avoid any areas of skin that have broken skin, cuts, or scrapes. Before surgery Follow these steps when using a CHG cloth to clean before surgery (unless your health care provider gives you different instructions): Using the CHG cloth, vigorously scrub the part of your body where you will be having surgery. Scrub using a back-and-forth motion for 3 minutes. The area on your body should be completely wet with CHG when you are done scrubbing. Do not rinse. Discard the cloth and let the area air-dry. Do not put any substances on the area afterward, such as powder, lotion, or perfume. Put on clean clothes  or pajamas. If it is the night before your surgery, sleep in clean sheets.  For general bathing Follow these steps when using CHG cloths for general bathing (unless your health care provider gives you different instructions). Use a separate CHG cloth for each area of your body. Make sure you wash between any folds of skin and between your fingers and toes. Wash your body in the following order, switching to a new cloth after each step: The front of your neck, shoulders, and chest. Both of your arms, under your arms, and your hands. Your stomach and groin area, avoiding the genitals. Your right leg and foot. Your left leg and foot. The back of your neck, your back, and your buttocks. Do not rinse. Discard the cloth and let the area air-dry. Do not put any substances  on your body afterward--such as powder, lotion, or perfume--unless you are told to do so by your health care provider. Only use lotions that are recommended by the manufacturer. Put on clean clothes or pajamas. Contact a health care provider if: Your skin gets irritated after scrubbing. You have questions about using your solution or cloth. You swallow any chlorhexidine. Call your local poison control center (1-(253)043-3318 in the [place] Get help right away if: Your eyes itch badly, or they become very red or swollen. Your skin itches badly and is red or swollen. Your hearing changes. You have trouble seeing. You have swelling or tingling in your mouth or throat. You have trouble breathing. These symptoms may represent a serious problem that is an emergency. Do not wait to see if the symptoms will go away. Get medical help right away. Call your local emergency services (911 in the [place] Do not drive yourself to the hospital. Summary Chlorhexidine gluconate (CHG) is a germ-killing (antiseptic) solution that is used to clean the skin. Cleaning your skin with CHG may help to lower your risk for infection. You may be given CHG to  use for bathing. It may be in a bottle or in a prepackaged cloth to use on your skin. Carefully follow your health care provider's instructions and the instructions on the product label. Do not use CHG if you have a chlorhexidine allergy. Contact your health care provider if your skin gets irritated after scrubbing. This information is not intended to replace advice given to you by your health care provider. Make sure you discuss any questions you have with your health care provider. Document Revised: 01/18/2021 Document Reviewed: 01/18/2021 Elsevier Patient Education  2022 [employer].

## 2021-12-02 ENCOUNTER — Inpatient Hospital Stay (HOSPITAL_COMMUNITY): Payer: Medicare HMO

## 2021-12-02 ENCOUNTER — Other Ambulatory Visit: Payer: Self-pay

## 2021-12-02 ENCOUNTER — Other Ambulatory Visit (HOSPITAL_COMMUNITY): Payer: Self-pay | Admitting: *Deleted

## 2021-12-02 ENCOUNTER — Encounter (HOSPITAL_COMMUNITY)
Admission: RE | Admit: 2021-12-02 | Discharge: 2021-12-02 | Disposition: A | Discharge status: Home / Self Care | Payer: Medicare HMO | Source: Ambulatory Visit | Attending: General Surgery | Admitting: General Surgery

## 2021-12-02 ENCOUNTER — Inpatient Hospital Stay (HOSPITAL_COMMUNITY): Payer: Medicare HMO | Attending: Hematology | Admitting: Hematology

## 2021-12-02 VITALS — BP 118/87 | HR 95 | Temp 97.6°F | Resp 20 | Ht 64.0 in | Wt 122.2 lb

## 2021-12-02 DIAGNOSIS — C786 Secondary malignant neoplasm of retroperitoneum and peritoneum: Secondary | ICD-10-CM | POA: Diagnosis not present

## 2021-12-02 DIAGNOSIS — F17210 Nicotine dependence, cigarettes, uncomplicated: Secondary | ICD-10-CM | POA: Diagnosis not present

## 2021-12-02 DIAGNOSIS — R634 Abnormal weight loss: Secondary | ICD-10-CM | POA: Insufficient documentation

## 2021-12-02 DIAGNOSIS — Z800 Family history of malignant neoplasm of digestive organs: Secondary | ICD-10-CM | POA: Insufficient documentation

## 2021-12-02 DIAGNOSIS — C50411 Malignant neoplasm of upper-outer quadrant of right female breast: Secondary | ICD-10-CM | POA: Diagnosis not present

## 2021-12-02 DIAGNOSIS — Z170 Estrogen receptor positive status [ER+]: Secondary | ICD-10-CM | POA: Diagnosis not present

## 2021-12-02 DIAGNOSIS — C3431 Malignant neoplasm of lower lobe, right bronchus or lung: Secondary | ICD-10-CM | POA: Diagnosis not present

## 2021-12-02 DIAGNOSIS — R630 Anorexia: Secondary | ICD-10-CM | POA: Diagnosis not present

## 2021-12-02 DIAGNOSIS — C792 Secondary malignant neoplasm of skin: Secondary | ICD-10-CM | POA: Diagnosis not present

## 2021-12-02 DIAGNOSIS — Z79811 Long term (current) use of aromatase inhibitors: Secondary | ICD-10-CM | POA: Diagnosis not present

## 2021-12-02 DIAGNOSIS — C3411 Malignant neoplasm of upper lobe, right bronchus or lung: Secondary | ICD-10-CM | POA: Insufficient documentation

## 2021-12-02 DIAGNOSIS — M545 Low back pain, unspecified: Secondary | ICD-10-CM | POA: Insufficient documentation

## 2021-12-02 DIAGNOSIS — M25551 Pain in right hip: Secondary | ICD-10-CM | POA: Insufficient documentation

## 2021-12-02 DIAGNOSIS — Z79899 Other long term (current) drug therapy: Secondary | ICD-10-CM | POA: Insufficient documentation

## 2021-12-02 DIAGNOSIS — Z5112 Encounter for antineoplastic immunotherapy: Secondary | ICD-10-CM | POA: Insufficient documentation

## 2021-12-02 DIAGNOSIS — Z79891 Long term (current) use of opiate analgesic: Secondary | ICD-10-CM | POA: Diagnosis not present

## 2021-12-02 DIAGNOSIS — C3490 Malignant neoplasm of unspecified part of unspecified bronchus or lung: Secondary | ICD-10-CM

## 2021-12-02 DIAGNOSIS — K5900 Constipation, unspecified: Secondary | ICD-10-CM | POA: Insufficient documentation

## 2021-12-02 DIAGNOSIS — C7931 Secondary malignant neoplasm of brain: Secondary | ICD-10-CM | POA: Insufficient documentation

## 2021-12-02 DIAGNOSIS — Z801 Family history of malignant neoplasm of trachea, bronchus and lung: Secondary | ICD-10-CM | POA: Insufficient documentation

## 2021-12-02 DIAGNOSIS — C50212 Malignant neoplasm of upper-inner quadrant of left female breast: Secondary | ICD-10-CM | POA: Diagnosis not present

## 2021-12-02 LAB — COMPREHENSIVE METABOLIC PANEL
ALT: 31 U/L (ref 0–44)
AST: 27 U/L (ref 15–41)
Albumin: 3.3 g/dL — ABNORMAL LOW (ref 3.5–5.0)
Alkaline Phosphatase: 72 U/L (ref 38–126)
Anion gap: 9 (ref 5–15)
BUN: 32 mg/dL — ABNORMAL HIGH (ref 8–23)
CO2: 24 mmol/L (ref 22–32)
Calcium: 9.2 mg/dL (ref 8.9–10.3)
Chloride: 105 mmol/L (ref 98–111)
Creatinine, Ser: 0.86 mg/dL (ref 0.44–1.00)
GFR, Estimated: >60 mL/min (ref >60)
Glucose, Bld: 149 mg/dL — ABNORMAL HIGH (ref 70–99)
Potassium: 3.9 mmol/L (ref 3.5–5.1)
Sodium: 138 mmol/L (ref 135–145)
Total Bilirubin: 0.7 mg/dL (ref 0.3–1.2)
Total Protein: 6.9 g/dL (ref 6.5–8.1)

## 2021-12-02 LAB — CBC WITH DIFFERENTIAL/PLATELET
Abs Immature Granulocytes: 0.03 10*3/uL (ref 0.00–0.07)
Basophils Absolute: 0.1 10*3/uL (ref 0.0–0.1)
Basophils Relative: 1 %
Eosinophils Absolute: 0.1 10*3/uL (ref 0.0–0.5)
Eosinophils Relative: 1 %
HCT: 38.6 % (ref 36.0–46.0)
Hemoglobin: 13.1 g/dL (ref 12.0–15.0)
Immature Granulocytes: 0 %
Lymphocytes Relative: 26 %
Lymphs Abs: 2.4 10*3/uL (ref 0.7–4.0)
MCH: 32.5 pg (ref 26.0–34.0)
MCHC: 33.9 g/dL (ref 30.0–36.0)
MCV: 95.8 fL (ref 80.0–100.0)
Monocytes Absolute: 0.7 10*3/uL (ref 0.1–1.0)
Monocytes Relative: 8 %
Neutro Abs: 5.9 10*3/uL (ref 1.7–7.7)
Neutrophils Relative %: 64 %
Platelets: 447 10*3/uL — ABNORMAL HIGH (ref 150–400)
RBC: 4.03 MIL/uL (ref 3.87–5.11)
RDW: 13.1 % (ref 11.5–15.5)
WBC: 9.3 10*3/uL (ref 4.0–10.5)
nRBC: 0 % (ref 0.0–0.2)

## 2021-12-02 LAB — MAGNESIUM: Magnesium: 2 mg/dL (ref 1.7–2.4)

## 2021-12-02 MED ORDER — SODIUM CHLORIDE 0.9 % IV SOLN: Freq: Once | INTRAVENOUS | Status: AC

## 2021-12-02 MED ORDER — OXYCODONE HCL 10 MG PO TABS: 10.0000 mg | ORAL_TABLET | Freq: Four times a day (QID) | ORAL | 0 refills | Status: DC | PRN

## 2021-12-02 NOTE — Progress Notes (Signed)
Patient presents today for follow up with Dr. Delton Coombes. Labs drawn. Per A Anderson RN / Dr. Delton Coombes infuse 1 Liter of NS over 2 hours. Vital signs within normal limits. BUN 32 all other labs within normal limits.   1 Liter of NA given today per MD orders. Tolerated infusion without adverse affects. Vital signs stable. No complaints at this time. Discharged from clinic ambulatory in stable condition. Alert and oriented x 3. F/U with [facility] as scheduled.

## 2021-12-02 NOTE — Patient Instructions (Addendum)
[facility] at [facility] Discharge Instructions   You were seen and examined today by Dr. Delton Coombes.  Continue Megace 10 ml twice a day for appetite.  Eat whatever you would like and have a taste for to maintain your weight.  Continue Senekot for constipation.  Hold this medication if you should have diarrhea.  We will give you IV fluids today and check your lab work.  Return as previously scheduled.   Thank you for choosing [place] at [facility] to provide your oncology and hematology care.  To afford each patient quality time with our provider, please arrive at least 15 minutes before your scheduled appointment time.   If you have a lab appointment with the [facility] please come in thru the Main Entrance and check in at the main information desk.  You need to re-schedule your appointment should you arrive 10 or more minutes late.  We strive to give you quality time with our providers, and arriving late affects you and other patients whose appointments are after yours.  Also, if you no show three or more times for appointments you may be dismissed from the clinic at the providers discretion.     Again, thank you for choosing [facility].  Our hope is that these requests will decrease the amount of time that you wait before being seen by our physicians.       _____________________________________________________________  Should you have questions after your visit to [facility], please contact our office at [phone] and follow the prompts.  Our office hours are 8:00 a.m. and 4:30 p.m. Monday - Friday.  Please note that voicemails left after 4:00 p.m. may not be returned until the following business day.  We are closed weekends and major holidays.  You do have access to a nurse 24-7, just call the main number to the clinic [phone] and do not press any options, hold on the line and a nurse will answer  the phone.    For prescription refill requests, have your pharmacy contact our office and allow 72 hours.    Due to Covid, you will need to wear a mask upon entering the hospital. If you do not have a mask, a mask will be given to you at the Main Entrance upon arrival. For doctor visits, patients may have 1 support person age 61 or older with them. For treatment visits, patients can not have anyone with them due to social distancing guidelines and our immunocompromised population.

## 2021-12-02 NOTE — Patient Instructions (Signed)
[facility]  Discharge Instructions: Thank you for choosing [facility] to provide your oncology and hematology care.  If you have a lab appointment with the [facility], please come in thru the Main Entrance and check in at the main information desk.  Wear comfortable clothing and clothing appropriate for easy access to any Portacath or PICC line.   We strive to give you quality time with your provider. You may need to reschedule your appointment if you arrive late (15 or more minutes).  Arriving late affects you and other patients whose appointments are after yours.  Also, if you miss three or more appointments without notifying the office, you may be dismissed from the clinic at the providers discretion.      For prescription refill requests, have your pharmacy contact our office and allow 72 hours for refills to be completed.    Today you received the following : 1 Liter of NS.       To help prevent nausea and vomiting after your treatment, we encourage you to take your nausea medication as directed.  BELOW ARE SYMPTOMS THAT SHOULD BE REPORTED IMMEDIATELY: *FEVER GREATER THAN 100.4 F (38 C) OR HIGHER *CHILLS OR SWEATING *NAUSEA AND VOMITING THAT IS NOT CONTROLLED WITH YOUR NAUSEA MEDICATION *UNUSUAL SHORTNESS OF BREATH *UNUSUAL BRUISING OR BLEEDING *URINARY PROBLEMS (pain or burning when urinating, or frequent urination) *BOWEL PROBLEMS (unusual diarrhea, constipation, pain near the anus) TENDERNESS IN MOUTH AND THROAT WITH OR WITHOUT PRESENCE OF ULCERS (sore throat, sores in mouth, or a toothache) UNUSUAL RASH, SWELLING OR PAIN  UNUSUAL VAGINAL DISCHARGE OR ITCHING   Items with * indicate a potential emergency and should be followed up as soon as possible or go to the Emergency Department if any problems should occur.  Please show the CHEMOTHERAPY ALERT CARD or IMMUNOTHERAPY ALERT CARD at check-in to the Emergency Department and triage nurse.  Should  you have questions after your visit or need to cancel or reschedule your appointment, please contact [facility] [phone]  and follow the prompts.  Office hours are 8:00 a.m. to 4:30 p.m. Monday - Friday. Please note that voicemails left after 4:00 p.m. may not be returned until the following business day.  We are closed weekends and major holidays. You have access to a nurse at all times for urgent questions. Please call the main number to the clinic [phone] and follow the prompts.  For any non-urgent questions, you may also contact your provider using MyChart. We now offer e-Visits for anyone 33 and older to request care online for non-urgent symptoms. For details visit [email or web address].   Also download the MyChart app! Go to the app store, search "MyChart", open the app, select [facility], and log in with your MyChart username and password.  Due to Covid, a mask is required upon entering the hospital/clinic. If you do not have a mask, one will be given to you upon arrival. For doctor visits, patients may have 1 support person aged 67 or older with them. For treatment visits, patients cannot have anyone with them due to current Covid guidelines and our immunocompromised population.

## 2021-12-02 NOTE — Progress Notes (Signed)
[facility] [address], [place] [address]   Patient Care Team: [name], DO as PCP - General (Family Medicine) Gala Romney Cristopher Estimable, MD as Consulting Physician (Gastroenterology) Brien Mates, RN as Oncology Nurse Navigator (Oncology) Derek Jack, MD as Medical Oncologist (Medical Oncology)  SUMMARY OF ONCOLOGIC HISTORY: Oncology History   No history exists.    CHIEF COMPLIANT: Follow-up of bilateral breast cancer   INTERVAL HISTORY: [name] is a 71 y.o. female here today for follow up of her bilateral breast cancer. Her last visit was on 11/18/2021.   Today she reports feeling poorly. She has been eating less. She has lost 8 lbs since 12/29. She is taking Megace BID starting on 1/2 which slightly increased her eating, but she is still below her baseline. She reports drinking 2 Ensure daily. She reports continued back pain which she rates 8/10 for which she is taking hydrocodone every 6 hours; the pain relief from the hydrocodone is lasting 2 hours. She reports hydrocodone makes her mildly sleepy. She is spending most of her day in the bed due to pain. She is taking anastrozole and tolerating it well. She reports watery diarrhea starting today. She has been taking Synocot BID since 1/9 for constipation.   REVIEW OF SYSTEMS:   Review of Systems  Constitutional:  Positive for appetite change and fatigue.  Respiratory:  Positive for cough and shortness of breath.   Musculoskeletal:  Positive for arthralgias (4/10 legs).  Psychiatric/Behavioral:  Positive for sleep disturbance.   All other systems reviewed and are negative.  I have reviewed the past medical history, past surgical history, social history and family history with the patient and they are unchanged from previous note.   ALLERGIES:   has No Known Allergies.   MEDICATIONS:  Current Outpatient Medications  Medication Sig Dispense Refill   anastrozole (ARIMIDEX) 1 MG  tablet Take 1 tablet (1 mg total) by mouth daily. 30 tablet 6   Cholecalciferol (VITAMIN D) 50 MCG (2000 UT) tablet Take 2,000 Units by mouth daily.     cyanocobalamin (,VITAMIN B-12,) 1000 MCG/ML injection Inject 1,000 mcg into the muscle every 30 (thirty) days.     docusate sodium (COLACE) 100 MG capsule Take 100 mg by mouth 2 (two) times daily.     Ensure (ENSURE) Take 237 mLs by mouth 2 (two) times daily between meals.     fluconazole (DIFLUCAN) 100 MG tablet Take 1 tablet (100 mg total) by mouth daily. Take two tabs on day one followed by one tablet daily. 8 tablet 0   folic acid (FOLVITE) 1 MG tablet Take 1 mg by mouth daily.     megestrol (MEGACE) 400 MG/10ML suspension Take 10 mLs (400 mg total) by mouth 2 (two) times daily. 480 mL 3   Omega-3 Fatty Acids (FISH OIL) 1000 MG CAPS Take 1,000 mg by mouth daily.     simvastatin (ZOCOR) 20 MG tablet Take 1 tablet (20 mg total) by mouth at bedtime. 90 tablet 3   HYDROcodone-acetaminophen (NORCO) 10-325 MG tablet Take 1 tablet by mouth every 6 (six) hours as needed. (Patient not taking: Reported on 12/02/2021) 120 tablet 0   ibuprofen (ADVIL) 200 MG tablet Take 400 mg by mouth every 6 (six) hours as needed for moderate pain. (Patient not taking: Reported on 12/02/2021)     losartan (COZAAR) 50 MG tablet TAKE 1 TABLET EVERY DAY 90 tablet 3   prochlorperazine (COMPAZINE) 10 MG tablet Take  1 tablet (10 mg total) by mouth every 6 (six) hours as needed for nausea or vomiting. (Patient not taking: Reported on 12/02/2021) 30 tablet 0   No current facility-administered medications for this visit.     PHYSICAL EXAMINATION: Performance status (ECOG): 1 - Symptomatic but completely ambulatory  Vitals:   12/02/21 1156  BP: 118/87  Pulse: 95  Resp: 20  Temp: 97.6 F (36.4 C)  SpO2: 98%   Wt Readings from Last 3 Encounters:  12/02/21 122 lb 3.2 oz (55.4 kg)  11/18/21 130 lb (59 kg)  11/11/21 133 lb (60.3 kg)   Physical Exam Vitals reviewed.   Constitutional:      Appearance: Normal appearance.  Cardiovascular:     Rate and Rhythm: Normal rate and regular rhythm.     Pulses: Normal pulses.     Heart sounds: Normal heart sounds.  Pulmonary:     Effort: Pulmonary effort is normal.     Breath sounds: Normal breath sounds.  Neurological:     General: No focal deficit present.     Mental Status: She is alert and oriented to person, place, and time.  Psychiatric:        Mood and Affect: Mood normal.        Behavior: Behavior normal.    Breast Exam Chaperone: Thana Ates     LABORATORY DATA:  I have reviewed the data as listed CMP Latest Ref Rng & Units 07/21/2021 07/06/2021 03/01/2021  Glucose 65 - 99 mg/dL 123(H) 98 95  BUN 8 - 27 mg/dL 10 11 13   Creatinine 0.57 - 1.00 mg/dL 0.82 0.78 0.91  Sodium 134 - 144 mmol/L 142 136 143  Potassium 3.5 - 5.2 mmol/L 3.2(L) 3.0(L) 4.8  Chloride 96 - 106 mmol/L 101 102 103  CO2 20 - 29 mmol/L 25 27 24   Calcium 8.7 - 10.3 mg/dL 9.9 8.8(L) 9.9  Total Protein 6.5 - 8.1 g/dL - 6.8 7.3  Total Bilirubin 0.3 - 1.2 mg/dL - 1.2 0.8  Alkaline Phos 38 - 126 U/L - 83 126(H)  AST 15 - 41 U/L - 15 15  ALT 0 - 44 U/L - 13 15   No results found for: [id number] Lab Results  Component Value Date   WBC 8.6 11/09/2021   HGB 12.6 11/09/2021   HCT 37.1 11/09/2021   MCV 95.9 11/09/2021   PLT 392 11/09/2021   NEUTROABS 3.8 07/06/2021    ASSESSMENT:  Bilateral breast cancer: - Screening mammogram on 08/24/2021 with suspicious 0.9 cm mass in the left breast at 10 o'clock position, 1.3 cm mass at the 12 o'clock position.  Suspicious 0.7 cm mass in the right breast at 10 o'clock position. - Biopsy of the left breast 12:00 and 10 o'clock position consistent with invasive ductal carcinoma, grade 3.  Tumor cells are negative for HER2.  ER is 10% positive with weak staining.  PR is 5% positive with moderate staining.  Ki-67 is 15%.  Tissue for this case was left in formalin for 72 hours, therefore repeat  prognostic panel was recommended on excision specimen. - Biopsy of the right breast 10 o'clock position shows invasive lobular carcinoma, ER 60% positive, PR 80% positive, Ki-67 2%, HER2 2+.  HER2 FISH pending. - PET scan on 10/21/2021 with a right lower lobe lung spiculated mass measuring 2.3 x 3.4 cm with SUV 8.1.  Hypermetabolic right middle lobe lung nodule measuring 9 mm with SUV 2.7.  1.5 x 2.1 cm left breast upper outer nodule SUV  2.8.  12 mm nodule in the supra-areolar medial left breast SUV 3.1.  Subcutaneous soft tissue nodule overlying the posterior lower left ribs measuring 1.4 cm with SUV 2.8.  10 mm left omental nodule.  Soft tissue nodule in the left pericolic mesentery measures 7 mm with SUV 1.8.  Soft tissue nodule posterior to the left psoas muscle measures 9 mm with SUV 2.1. - MRI of the lumbar spine on 09/14/2021 with 1 cm foci of low T1 and high T2 signal within L5 and S1 vertebral bodies, nonspecific, could be marrow space metastatic disease. - 25 pound weight loss since the beginning of this year due to decreased appetite and eating. - Lower back and right hip pain since February/March of this year on and off. - Soft tissue nodule on the back biopsy on 11/09/2021 shows metastatic poorly differentiated carcinoma, CK7 and TTF-1 positive, p40 negative.  Negative for CK20, GATA3, SOX10. - Bronchoscopy and biopsy of the right lower lobe and right upper lobe consistent with adenocarcinoma, TTF-1 and Napsin patchy positive, negative for p40.    Social/family history: - She lives at home with her friend.  She is retired and worked in Architect, as a Artist, and Corporate investment banker.  She is current active smoker, half to 1 pack/day for 50 to 60 years. - Father had stomach cancer.  Brother had lung cancer.   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  10 mm enhancing focus in the left parietal calvarium. - NGS test results are pending at this time. - Reviewed labs today with which showed near normal CBC and LFTs.  Albumin is mildly low at 3.3. - As she is feeling weak, we have recommended giving some IV hydration.  She is scheduled for port on Monday. - RTC at previous scheduled appointment.  2.  Bilateral breast cancer: - We will not be doing definitive treatment given her metastatic lung cancer. - We discussed about starting her on anastrozole 1 mg daily to control it.  We discussed side effects in detail.  We have sent a prescription to her pharmacy.  3.  Low back pain and right hip pain: - She is taking hydrocodone 10/325 every 6 hours as needed which is not helping. - We will start her on oxycodone 10 mg every 6 hours as needed.  4.  Weight loss/loss of appetite: - Marinol did not help.  We started her on Megace 400 mg twice daily on 11/22/2020.  She is eating better but not completely normal.  She is drinking 2 cans of Ensure per day.  5.  Oral thrush: - She used 7 days of Diflucan.  Ritta Slot has cleared.  6.  Constipation: - Continue Senokot 1 to 2 tablets daily which is helping.  Breast Cancer therapy associated bone loss: I have recommended calcium, Vitamin D and weight bearing exercises.  Orders placed this encounter:  No orders of the defined types were placed in this encounter.   The patient has a good understanding of the overall plan. She agrees with it. She will call with any problems that may develop before the next visit here.  Derek Jack, MD [facility] [phone]   I, Thana Ates, am acting as a scribe for Dr. Derek Jack.  Kinnie Scales MD, have reviewed the above  documentation for accuracy and completeness, and I agree with the above.

## 2021-12-03 DIAGNOSIS — C3481 Malignant neoplasm of overlapping sites of right bronchus and lung: Secondary | ICD-10-CM | POA: Diagnosis not present

## 2021-12-03 DIAGNOSIS — Z720 Tobacco use: Secondary | ICD-10-CM | POA: Diagnosis not present

## 2021-12-03 DIAGNOSIS — C7989 Secondary malignant neoplasm of other specified sites: Secondary | ICD-10-CM | POA: Diagnosis not present

## 2021-12-03 DIAGNOSIS — Z801 Family history of malignant neoplasm of trachea, bronchus and lung: Secondary | ICD-10-CM | POA: Diagnosis not present

## 2021-12-06 ENCOUNTER — Other Ambulatory Visit: Payer: Self-pay

## 2021-12-06 ENCOUNTER — Encounter (HOSPITAL_COMMUNITY): Payer: Self-pay | Admitting: General Surgery

## 2021-12-06 ENCOUNTER — Encounter (HOSPITAL_COMMUNITY)
Admission: RE | Disposition: A | Discharge status: Home / Self Care | Payer: Self-pay | Source: Home / Self Care | Attending: Emergency Medicine

## 2021-12-06 ENCOUNTER — Encounter (HOSPITAL_COMMUNITY): Payer: Self-pay | Admitting: Anesthesiology

## 2021-12-06 ENCOUNTER — Ambulatory Visit (HOSPITAL_COMMUNITY): Payer: Medicare HMO

## 2021-12-06 ENCOUNTER — Ambulatory Visit (HOSPITAL_COMMUNITY)
Admission: RE | Admit: 2021-12-06 | Discharge: 2021-12-06 | Disposition: A | Discharge status: Home / Self Care | Payer: Medicare HMO | Attending: Emergency Medicine | Admitting: Emergency Medicine

## 2021-12-06 ENCOUNTER — Encounter (HOSPITAL_COMMUNITY): Payer: Self-pay

## 2021-12-06 DIAGNOSIS — I4891 Unspecified atrial fibrillation: Secondary | ICD-10-CM | POA: Diagnosis not present

## 2021-12-06 DIAGNOSIS — Z538 Procedure and treatment not carried out for other reasons: Secondary | ICD-10-CM | POA: Insufficient documentation

## 2021-12-06 DIAGNOSIS — R002 Palpitations: Secondary | ICD-10-CM | POA: Diagnosis not present

## 2021-12-06 DIAGNOSIS — N63 Unspecified lump in unspecified breast: Secondary | ICD-10-CM | POA: Diagnosis present

## 2021-12-06 DIAGNOSIS — J984 Other disorders of lung: Secondary | ICD-10-CM | POA: Insufficient documentation

## 2021-12-06 DIAGNOSIS — C3490 Malignant neoplasm of unspecified part of unspecified bronchus or lung: Secondary | ICD-10-CM | POA: Insufficient documentation

## 2021-12-06 DIAGNOSIS — I4892 Unspecified atrial flutter: Secondary | ICD-10-CM | POA: Insufficient documentation

## 2021-12-06 DIAGNOSIS — C50912 Malignant neoplasm of unspecified site of left female breast: Secondary | ICD-10-CM | POA: Diagnosis not present

## 2021-12-06 DIAGNOSIS — C799 Secondary malignant neoplasm of unspecified site: Secondary | ICD-10-CM | POA: Diagnosis not present

## 2021-12-06 DIAGNOSIS — I493 Ventricular premature depolarization: Secondary | ICD-10-CM | POA: Diagnosis not present

## 2021-12-06 DIAGNOSIS — R000 Tachycardia, unspecified: Secondary | ICD-10-CM | POA: Insufficient documentation

## 2021-12-06 DIAGNOSIS — I471 Supraventricular tachycardia: Secondary | ICD-10-CM | POA: Insufficient documentation

## 2021-12-06 DIAGNOSIS — C7981 Secondary malignant neoplasm of breast: Secondary | ICD-10-CM | POA: Diagnosis not present

## 2021-12-06 DIAGNOSIS — C50411 Malignant neoplasm of upper-outer quadrant of right female breast: Secondary | ICD-10-CM | POA: Diagnosis present

## 2021-12-06 DIAGNOSIS — R0602 Shortness of breath: Secondary | ICD-10-CM | POA: Diagnosis not present

## 2021-12-06 DIAGNOSIS — I480 Paroxysmal atrial fibrillation: Secondary | ICD-10-CM | POA: Diagnosis not present

## 2021-12-06 DIAGNOSIS — C50212 Malignant neoplasm of upper-inner quadrant of left female breast: Secondary | ICD-10-CM | POA: Diagnosis present

## 2021-12-06 DIAGNOSIS — C50412 Malignant neoplasm of upper-outer quadrant of left female breast: Secondary | ICD-10-CM | POA: Diagnosis present

## 2021-12-06 DIAGNOSIS — N6320 Unspecified lump in the left breast, unspecified quadrant: Secondary | ICD-10-CM | POA: Diagnosis not present

## 2021-12-06 LAB — CBC WITH DIFFERENTIAL/PLATELET
Abs Immature Granulocytes: 0.03 10*3/uL (ref 0.00–0.07)
Basophils Absolute: 0.1 10*3/uL (ref 0.0–0.1)
Basophils Relative: 1 %
Eosinophils Absolute: 0 10*3/uL (ref 0.0–0.5)
Eosinophils Relative: 0 %
HCT: 36.5 % (ref 36.0–46.0)
Hemoglobin: 11.6 g/dL — ABNORMAL LOW (ref 12.0–15.0)
Immature Granulocytes: 0 %
Lymphocytes Relative: 33 %
Lymphs Abs: 4.1 10*3/uL — ABNORMAL HIGH (ref 0.7–4.0)
MCH: 32 pg (ref 26.0–34.0)
MCHC: 31.8 g/dL (ref 30.0–36.0)
MCV: 100.8 fL — ABNORMAL HIGH (ref 80.0–100.0)
Monocytes Absolute: 1.1 10*3/uL — ABNORMAL HIGH (ref 0.1–1.0)
Monocytes Relative: 9 %
Neutro Abs: 7.1 10*3/uL (ref 1.7–7.7)
Neutrophils Relative %: 57 %
Platelets: 423 10*3/uL — ABNORMAL HIGH (ref 150–400)
RBC: 3.62 MIL/uL — ABNORMAL LOW (ref 3.87–5.11)
RDW: 12.8 % (ref 11.5–15.5)
WBC: 12.5 10*3/uL — ABNORMAL HIGH (ref 4.0–10.5)
nRBC: 0 % (ref 0.0–0.2)

## 2021-12-06 LAB — COMPREHENSIVE METABOLIC PANEL
ALT: 38 U/L (ref 0–44)
AST: 22 U/L (ref 15–41)
Albumin: 3.1 g/dL — ABNORMAL LOW (ref 3.5–5.0)
Alkaline Phosphatase: 68 U/L (ref 38–126)
Anion gap: 9 (ref 5–15)
BUN: 19 mg/dL (ref 8–23)
CO2: 21 mmol/L — ABNORMAL LOW (ref 22–32)
Calcium: 8.7 mg/dL — ABNORMAL LOW (ref 8.9–10.3)
Chloride: 106 mmol/L (ref 98–111)
Creatinine, Ser: 0.64 mg/dL (ref 0.44–1.00)
GFR, Estimated: >60 mL/min (ref >60)
Glucose, Bld: 111 mg/dL — ABNORMAL HIGH (ref 70–99)
Potassium: 3.4 mmol/L — ABNORMAL LOW (ref 3.5–5.1)
Sodium: 136 mmol/L (ref 135–145)
Total Bilirubin: 0.7 mg/dL (ref 0.3–1.2)
Total Protein: 6.7 g/dL (ref 6.5–8.1)

## 2021-12-06 LAB — D-DIMER, QUANTITATIVE: D-Dimer, Quant: 1.39 ug/mL-FEU — ABNORMAL HIGH (ref 0.00–0.50)

## 2021-12-06 LAB — TROPONIN I (HIGH SENSITIVITY)
Troponin I (High Sensitivity): 5 ng/L (ref <18)
Troponin I (High Sensitivity): 9 ng/L (ref <18)

## 2021-12-06 LAB — TSH: TSH: 2.382 u[IU]/mL (ref 0.350–4.500)

## 2021-12-06 LAB — MAGNESIUM: Magnesium: 1.9 mg/dL (ref 1.7–2.4)

## 2021-12-06 IMAGING — CT CT ANGIO CHEST
2 of 6 series · 18 of 46 positions shown · IV contrast (Omnipaque or Isovue)
Comparison: Noncontrast chest CT [DATE]

CLINICAL DATA: Pulmonary embolism suspected. High probability due
to tachycardia and left-sided chest pain that began this morning

EXAM:
CT ANGIOGRAPHY CHEST WITH CONTRAST
TECHNIQUE: Multidetector CT imaging of the chest was performed using the
standard protocol during bolus administration of intravenous
contrast. Multiplanar CT image reconstructions and MIPs were
obtained to evaluate the vascular anatomy.

[Series 6: pe axial thins · axial · 0.69mm/px · z∈[-256,-19]mm · 15 of 324 slices shown]
[im 14/324  lung]
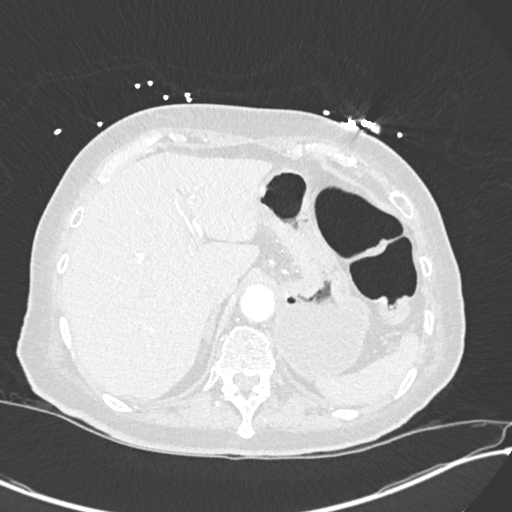
[im 41/324  soft-tissue]
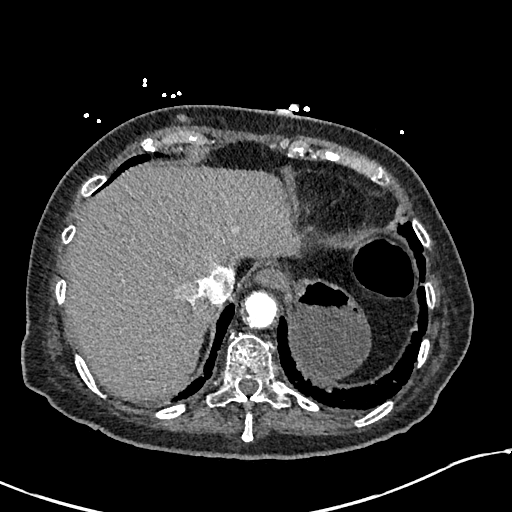
[im 54/324  lung]
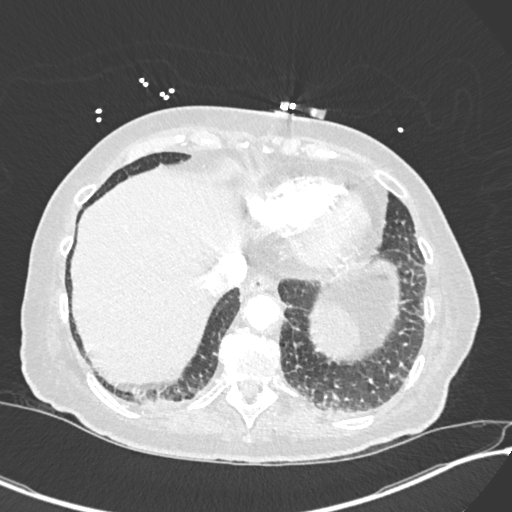
[im 81/324  soft-tissue]
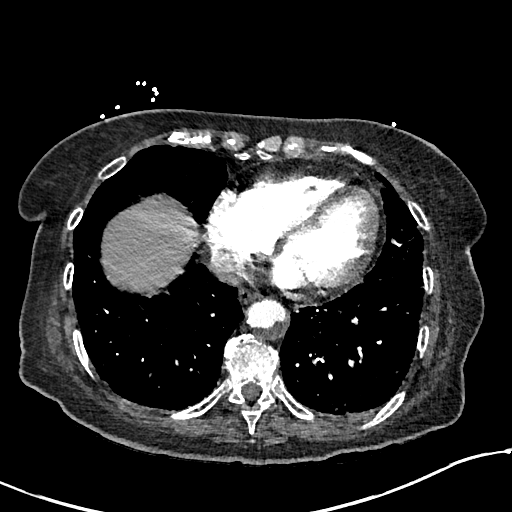
[im 95/324  lung]
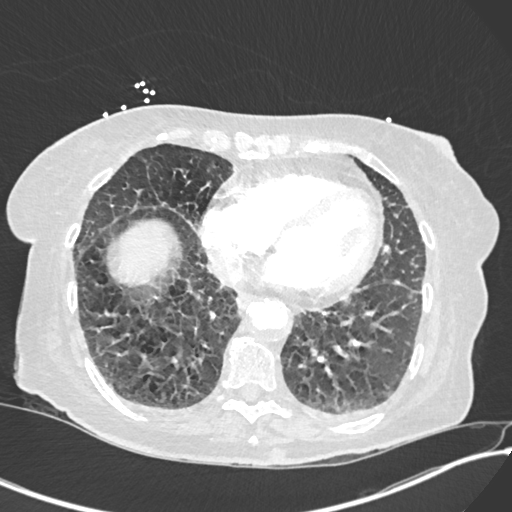
[im 122/324  soft-tissue]
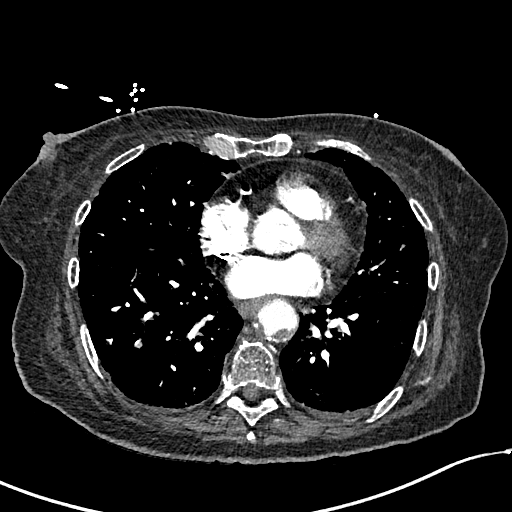
[im 135/324  lung]
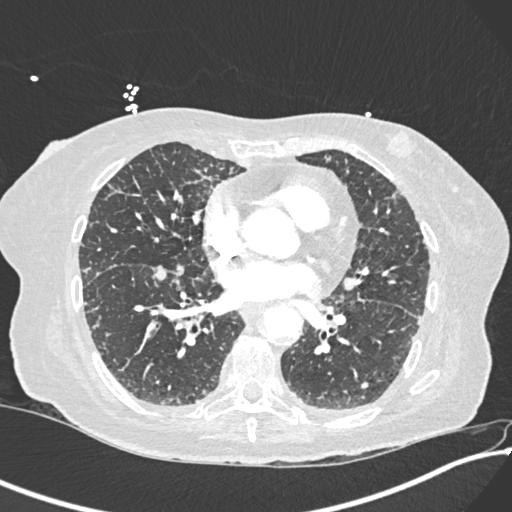
[im 162/324  soft-tissue]
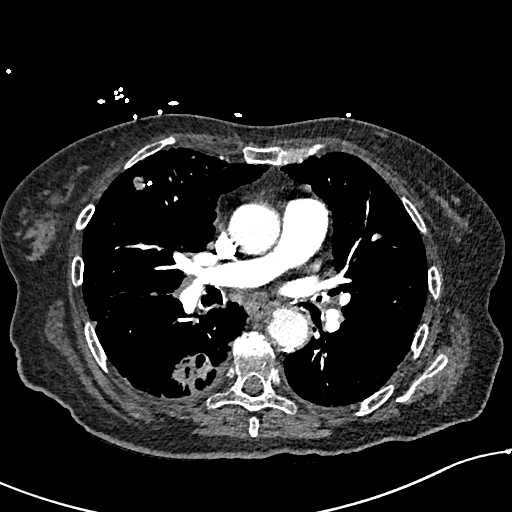
[im 189/324  lung]
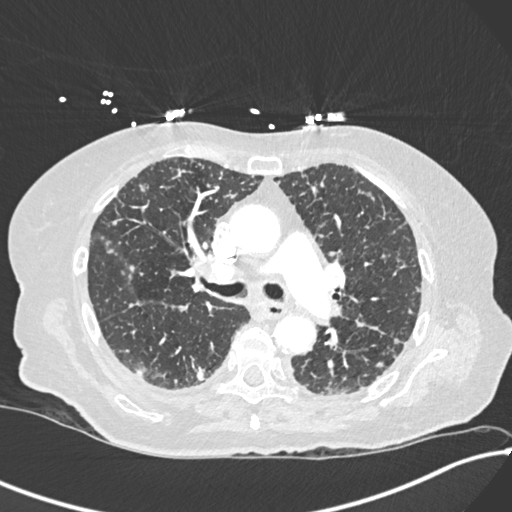
[im 202/324  soft-tissue]
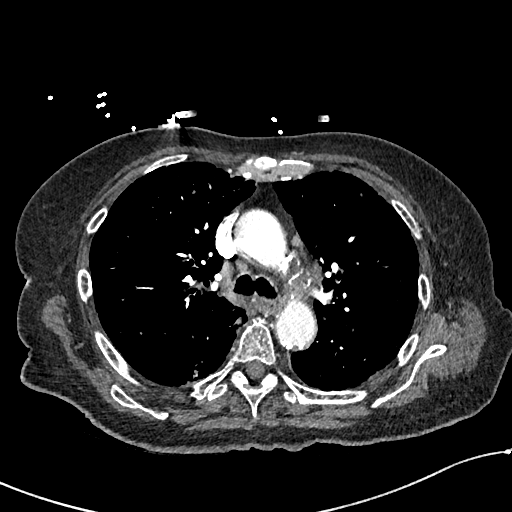
[im 229/324  lung]
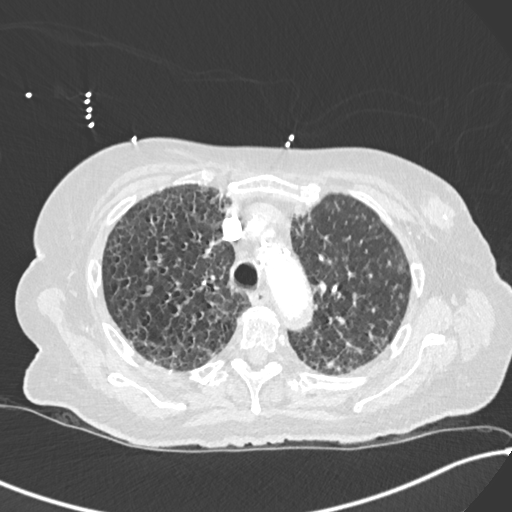
[im 243/324  soft-tissue]
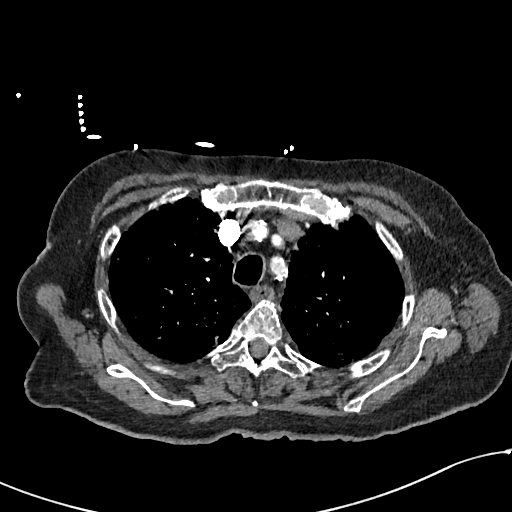
[im 270/324  lung]
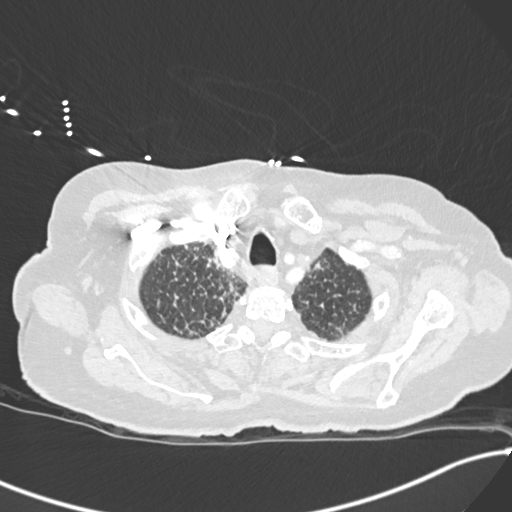
[im 283/324  soft-tissue]
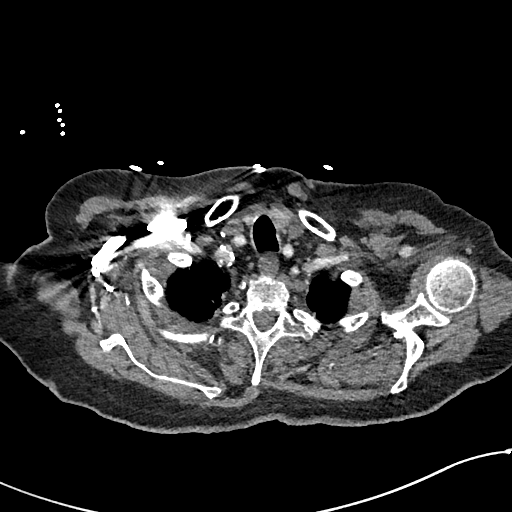
[im 310/324  lung]
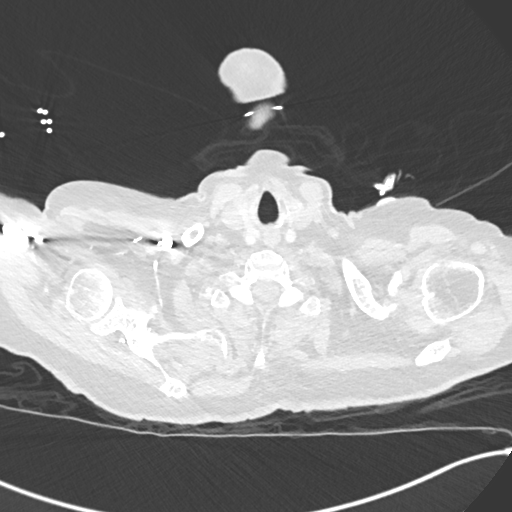

[Series 8: cor soft · coronal · 0.54mm/px · 3 of 151 slices shown]
[im 38/151  soft-tissue]
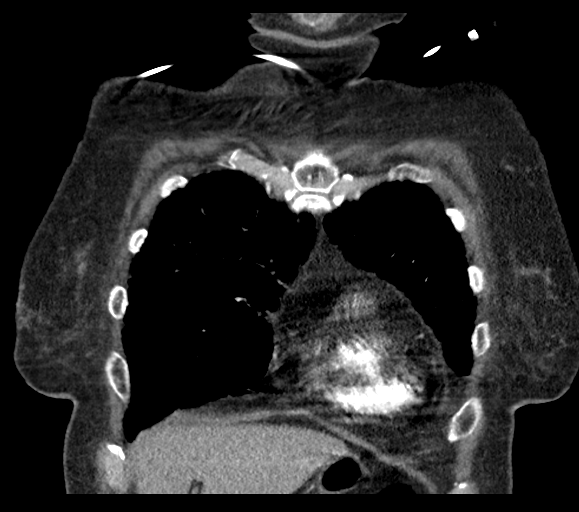
[im 76/151  soft-tissue]
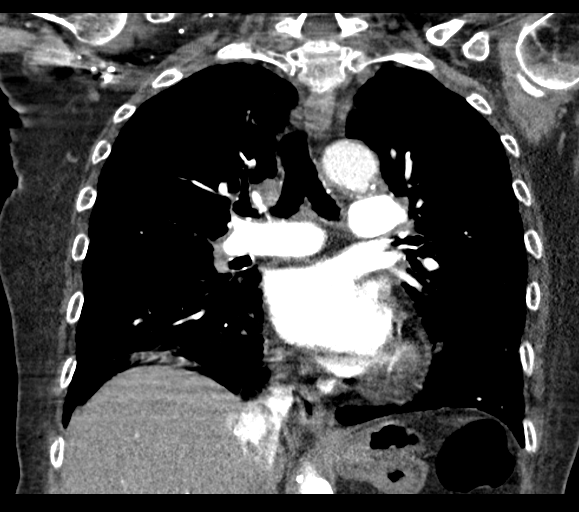
[im 113/151  soft-tissue]
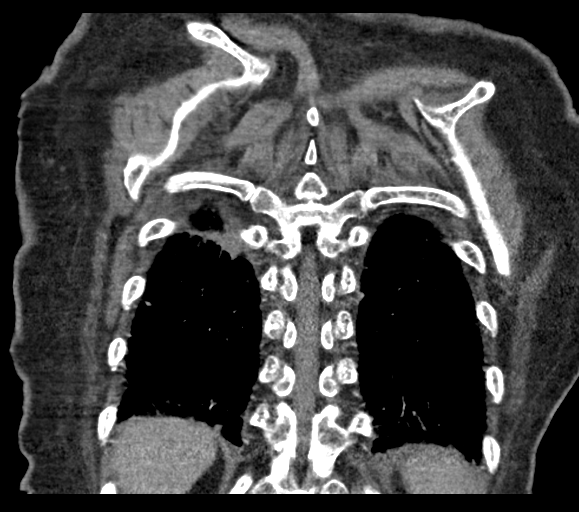

[18 of 46 positions shown; findings below may reference images not displayed]

RADIATION DOSE REDUCTION: This exam was performed according to the
departmental dose-optimization program which includes automated
exposure control, adjustment of the mA and/or kV according to
patient size and/or use of iterative reconstruction technique.

CONTRAST:  75mL OMNIPAQUE IOHEXOL 350 MG/ML SOLN
FINDINGS: Cardiovascular: Satisfactory opacification of the pulmonary arteries
to the segmental level. No evidence of pulmonary embolism. Normal
heart size. No pericardial effusion. Atheromatous calcification of
the aorta and coronaries. Irregular luminal plaque at the descending
aorta.

Mediastinum/Nodes: Negative for adenopathy

Lungs/Pleura: Diffuse micro nodularity and fissure lobulation.
Partially cavitary mass in the subpleural right lower lobe measuring
3.3 cm. No detected progression of these nodules but there is
superimposed interlobular septal thickening. No pleural effusion. No
focal consolidation.

Upper Abdomen: No acute finding

Musculoskeletal: No acute or aggressive finding.

Other: 2 left breast masses. The lower and more medial now measures
18 mm as compared to 15 mm.

Review of the MIP images confirms the above findings.
IMPRESSION: 1. Negative for pulmonary embolism.
2. Mild interstitial edema superimposed on pulmonary metastatic
disease with lymphangitic component.
3. Two left breast masses, 1 measuring mildly larger than on CT last
month.

## 2021-12-06 IMAGING — DX DG CHEST 1V PORT
1 series · 1 of 1 positions shown · non-contrast
Comparison: [DATE] and chest CT [DATE]

CLINICAL DATA: Shortness of breath.  Tachycardia.

EXAM:
PORTABLE CHEST 1 VIEW

[chest ap]
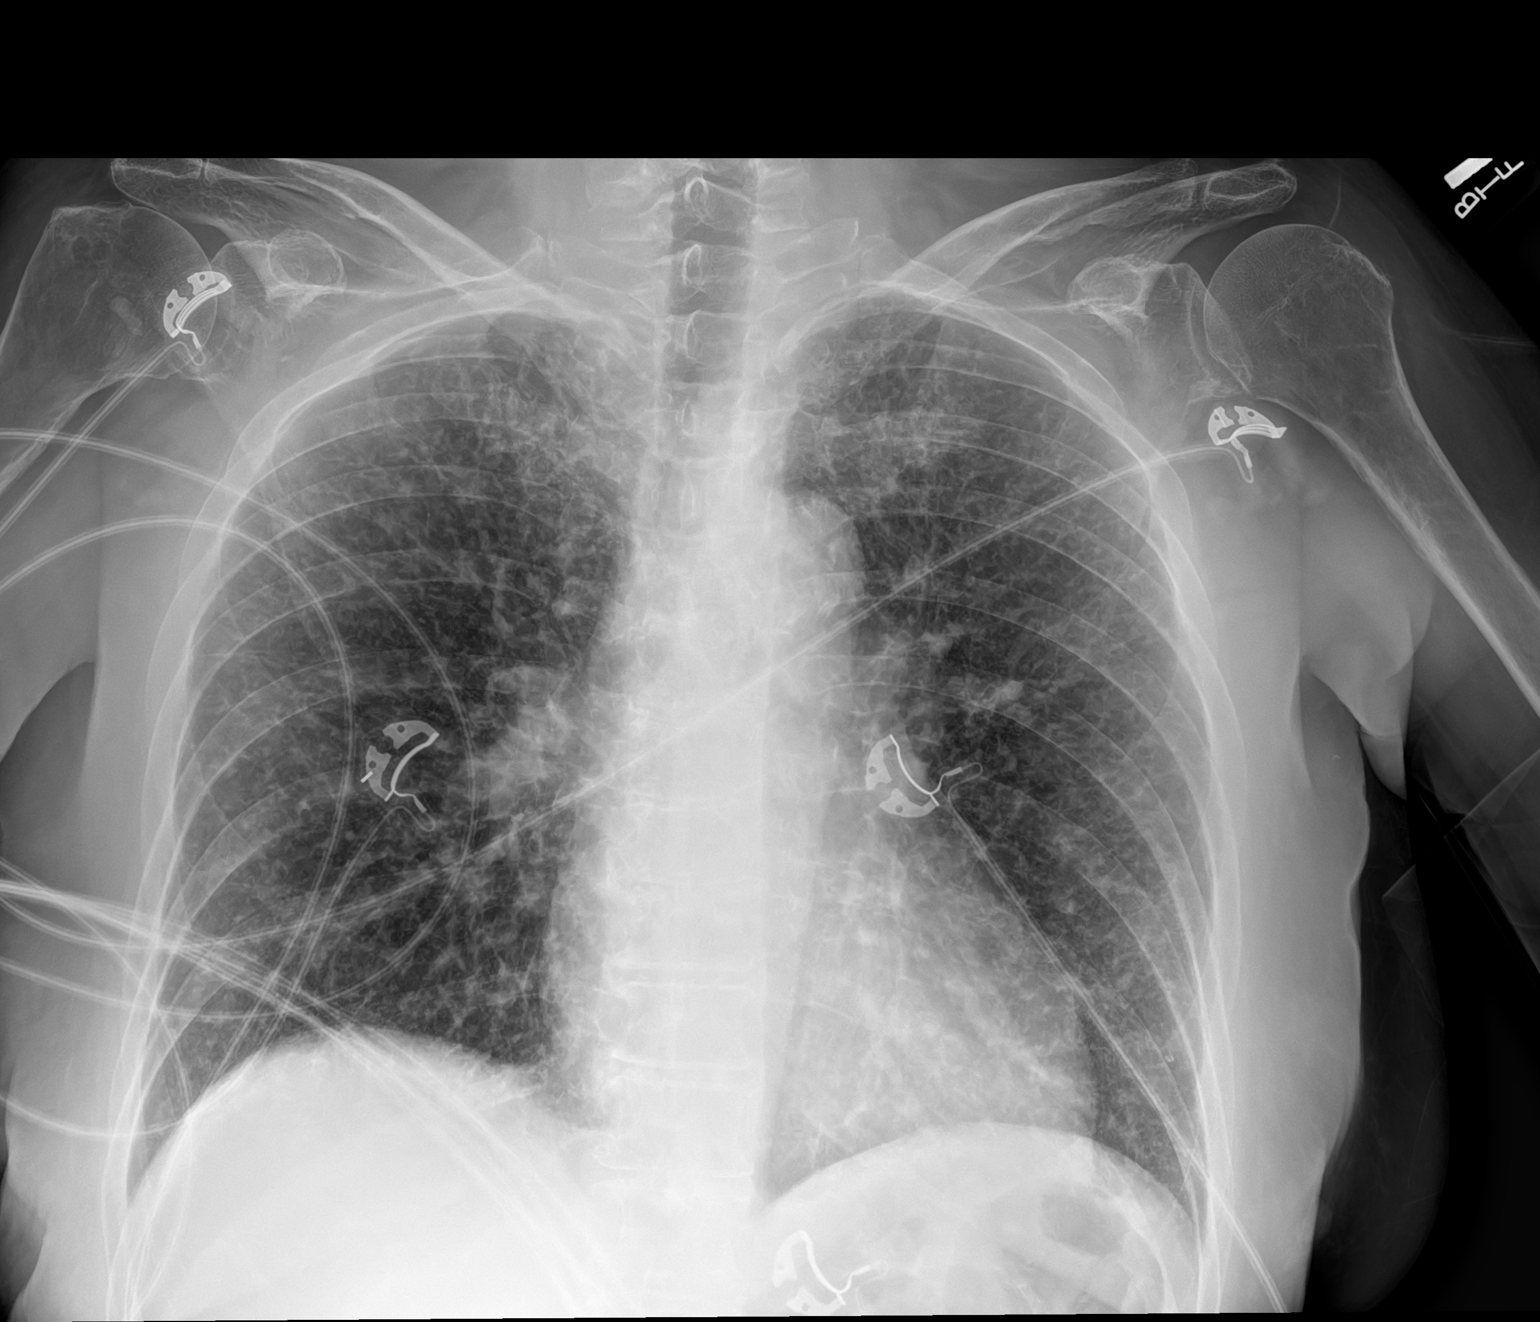

[1 of 1 positions shown; findings below may reference images not displayed]

FINDINGS: Prominent interstitial lung markings appear to be chronic. In
addition, there are tiny nodular densities in lungs and similar to
the prior chest CT. No significant airspace disease or lung
consolidation. Heart size is within normal limits and stable.
Trachea is midline. Negative for a pneumothorax. No overt pulmonary
edema.
IMPRESSION: Chronic lung changes without acute findings.

## 2021-12-06 SURGERY — INSERTION, TUNNELED CENTRAL VENOUS DEVICE, WITH PORT
Anesthesia: Monitor Anesthesia Care

## 2021-12-06 MED ORDER — METOPROLOL TARTRATE 25 MG PO TABS: 25.0000 mg | ORAL_TABLET | Freq: Two times a day (BID) | ORAL | 1 refills | Status: DC

## 2021-12-06 MED ORDER — DILTIAZEM HCL 25 MG/5ML IV SOLN
10.0000 mg | Freq: Once | INTRAVENOUS | Status: AC
  Administered 2021-12-06: 10 mg via INTRAVENOUS
  Filled 2021-12-06: qty 5

## 2021-12-06 MED ORDER — METOPROLOL TARTRATE 5 MG/5ML IV SOLN
5.0000 mg | Freq: Once | INTRAVENOUS | Status: AC
Start: 2021-12-06 — End: 2021-12-06
  Administered 2021-12-06: 5 mg via INTRAVENOUS
  Filled 2021-12-06: qty 5

## 2021-12-06 MED ORDER — CEFAZOLIN SODIUM-DEXTROSE 2-4 GM/100ML-% IV SOLN: 2.0000 g | INTRAVENOUS | Status: DC

## 2021-12-06 MED ORDER — MORPHINE SULFATE (PF) 2 MG/ML IV SOLN
2.0000 mg | Freq: Once | INTRAVENOUS | Status: AC
  Administered 2021-12-06: 2 mg via INTRAVENOUS
  Filled 2021-12-06: qty 1

## 2021-12-06 MED ORDER — POTASSIUM CHLORIDE CRYS ER 20 MEQ PO TBCR
40.0000 meq | EXTENDED_RELEASE_TABLET | Freq: Once | ORAL | Status: AC
  Administered 2021-12-06: 40 meq via ORAL
  Filled 2021-12-06: qty 2

## 2021-12-06 MED ORDER — SODIUM CHLORIDE 0.9 % IV BOLUS
500.0000 mL | Freq: Once | INTRAVENOUS | Status: AC
  Administered 2021-12-06: 500 mL via INTRAVENOUS

## 2021-12-06 MED ORDER — CHLORHEXIDINE GLUCONATE CLOTH 2 % EX PADS: 6.0000 | MEDICATED_PAD | Freq: Once | CUTANEOUS | Status: DC

## 2021-12-06 MED ORDER — METOPROLOL TARTRATE 25 MG PO TABS
25.0000 mg | ORAL_TABLET | Freq: Once | ORAL | Status: AC
  Administered 2021-12-06: 25 mg via ORAL
  Filled 2021-12-06: qty 1

## 2021-12-06 MED ORDER — HYDROMORPHONE HCL 1 MG/ML IJ SOLN
0.5000 mg | Freq: Once | INTRAMUSCULAR | Status: AC
  Administered 2021-12-06: 0.5 mg via INTRAVENOUS
  Filled 2021-12-06: qty 1

## 2021-12-06 MED ORDER — IOHEXOL 350 MG/ML SOLN
100.0000 mL | Freq: Once | INTRAVENOUS | Status: AC | PRN
  Administered 2021-12-06: 75 mL via INTRAVENOUS

## 2021-12-06 MED ORDER — DILTIAZEM HCL-DEXTROSE 125-5 MG/125ML-% IV SOLN (PREMIX)
5.0000 mg/h | INTRAVENOUS | Status: DC
  Administered 2021-12-06: 5 mg/h via INTRAVENOUS
  Filled 2021-12-06: qty 125

## 2021-12-06 MED ORDER — LIDOCAINE HCL (PF) 1 % IJ SOLN
INTRAMUSCULAR | Status: AC
  Filled 2021-12-06: qty 30

## 2021-12-06 MED ORDER — HEPARIN SOD (PORK) LOCK FLUSH 100 UNIT/ML IV SOLN
INTRAVENOUS | Status: AC
  Filled 2021-12-06: qty 5

## 2021-12-06 MED ORDER — CHLORHEXIDINE GLUCONATE CLOTH 2 % EX PADS
6.0000 | MEDICATED_PAD | Freq: Once | CUTANEOUS | Status: DC
Start: 2021-12-06 — End: 2021-12-06

## 2021-12-06 NOTE — Progress Notes (Signed)
Pt sent to ED for tachycardia. EKG perform. ST, HR 175. Pt c/o of sob and L lower Chest pain. Dr. Delma Freeze made aware.    Pt taken to ER. Handout given to RN. Niece called and made aware

## 2021-12-06 NOTE — ED Notes (Signed)
Patient transported to CT 

## 2021-12-06 NOTE — ED Provider Notes (Signed)
[facility] EMERGENCY DEPARTMENT Provider Note   CSN: [id number] Arrival date & time: 12/06/21  0930     History  Chief Complaint  Patient presents with   Tachycardia    Elizabeth Wu is a 71 y.o. female.  Patient has a history of lung and breast cancer.  She was going to get a Port-A-Cath today and it was noticed that she was in rapid A. fib at a rate of about 170.  Patient complains of palpitation and weakness  The history is provided by the patient and medical records. No language interpreter was used.  Palpitations Palpitations quality:  Irregular Onset quality:  Unable to specify Timing:  Constant Progression:  Unchanged Chronicity:  New Context: not anxiety   Relieved by:  Nothing Worsened by:  Nothing Ineffective treatments:  None tried Associated symptoms: no back pain, no chest pain and no cough       Home Medications Prior to Admission medications   Medication Sig Start Date End Date Taking? Authorizing Provider  anastrozole (ARIMIDEX) 1 MG tablet Take 1 tablet (1 mg total) by mouth daily. 11/18/21  Yes Derek Jack, MD  Cholecalciferol (VITAMIN D) 50 MCG (2000 UT) tablet Take 2,000 Units by mouth daily.   Yes [provider]  cyanocobalamin (,VITAMIN B-12,) 1000 MCG/ML injection Inject 1,000 mcg into the muscle every 30 (thirty) days.   Yes [provider]  docusate sodium (COLACE) 100 MG capsule Take 100 mg by mouth 2 (two) times daily.   Yes [provider]  Ensure (ENSURE) Take 237 mLs by mouth 2 (two) times daily between meals.   Yes [provider]  folic acid (FOLVITE) 1 MG tablet Take 1 mg by mouth daily.   Yes [provider]  HYDROcodone-acetaminophen (NORCO) 10-325 MG tablet Take 1 tablet by mouth every 6 (six) hours as needed. Patient not taking: Reported on 12/02/2021 11/18/21  Yes Derek Jack, MD  ibuprofen (ADVIL) 200 MG tablet Take 400 mg by mouth every 6 (six) hours as needed for  moderate pain. Patient not taking: Reported on 12/02/2021   Yes [provider]  losartan (COZAAR) 50 MG tablet TAKE 1 TABLET EVERY DAY 11/23/21  Yes Gottschalk, Ashly M, DO  megestrol (MEGACE) 400 MG/10ML suspension Take 10 mLs (400 mg total) by mouth 2 (two) times daily. 11/18/21  Yes Derek Jack, MD  metoprolol tartrate (LOPRESSOR) 25 MG tablet Take 1 tablet (25 mg total) by mouth 2 (two) times daily. 12/06/21  Yes Milton Ferguson, MD  Omega-3 Fatty Acids (FISH OIL) 1000 MG CAPS Take 1,000 mg by mouth daily.   Yes [provider]  prochlorperazine (COMPAZINE) 10 MG tablet Take 1 tablet (10 mg total) by mouth every 6 (six) hours as needed for nausea or vomiting. Patient not taking: Reported on 12/02/2021 10/25/21  Yes Derek Jack, MD  simvastatin (ZOCOR) 20 MG tablet Take 1 tablet (20 mg total) by mouth at bedtime. 11/30/20  Yes Gottschalk, Leatrice Jewels M, DO  fluconazole (DIFLUCAN) 100 MG tablet Take 1 tablet (100 mg total) by mouth daily. Take two tabs on day one followed by one tablet daily. 11/18/21   Derek Jack, MD  Oxycodone HCl 10 MG TABS Take 1 tablet (10 mg total) by mouth every 6 (six) hours as needed. 12/02/21   Derek Jack, MD      Allergies    Patient has no known allergies.    Review of Systems   Review of Systems  Constitutional:  Negative for appetite change  and fatigue.  HENT:  Negative for congestion, ear discharge and sinus pressure.   Eyes:  Negative for discharge.  Respiratory:  Negative for cough.   Cardiovascular:  Positive for palpitations. Negative for chest pain.  Gastrointestinal:  Negative for abdominal pain and diarrhea.  Genitourinary:  Negative for frequency and hematuria.  Musculoskeletal:  Negative for back pain.  Skin:  Negative for rash.  Neurological:  Negative for seizures and headaches.  Psychiatric/Behavioral:  Negative for hallucinations.    Physical Exam Updated Vital Signs BP 120/64    Pulse (!) 106     Temp 98.7 F (37.1 C) (Oral)    Resp (!) 24    Ht 5\' 4"  (1.626 m)    Wt 55.4 kg    SpO2 93%    BMI 20.98 kg/m  Physical Exam Vitals and nursing note reviewed.  Constitutional:      Appearance: She is well-developed.  HENT:     Head: Normocephalic.  Eyes:     General: No scleral icterus.    Conjunctiva/sclera: Conjunctivae normal.  Neck:     Thyroid: No thyromegaly.  Cardiovascular:     Rate and Rhythm: Tachycardia present. Rhythm irregular.     Heart sounds: No murmur heard.   No friction rub. No gallop.  Pulmonary:     Breath sounds: No stridor. No wheezing or rales.  Chest:     Chest wall: No tenderness.  Abdominal:     General: There is no distension.     Tenderness: There is no abdominal tenderness. There is no rebound.  Musculoskeletal:        General: Normal range of motion.     Cervical back: Neck supple.  Lymphadenopathy:     Cervical: No cervical adenopathy.  Skin:    Findings: No erythema or rash.  Neurological:     Mental Status: She is oriented to person, place, and time.     Motor: No abnormal muscle tone.     Coordination: Coordination normal.  Psychiatric:        Behavior: Behavior normal.    ED Results / Procedures / Treatments   Labs (all labs ordered are listed, but only abnormal results are displayed) Labs Reviewed  CBC WITH DIFFERENTIAL/PLATELET - Abnormal; Notable for the following components:      Result Value   WBC 12.5 (*)    RBC 3.62 (*)    Hemoglobin 11.6 (*)    MCV 100.8 (*)    Platelets 423 (*)    Lymphs Abs 4.1 (*)    Monocytes Absolute 1.1 (*)    All other components within normal limits  D-DIMER, QUANTITATIVE - Abnormal; Notable for the following components:   D-Dimer, Quant 1.39 (*)    All other components within normal limits  COMPREHENSIVE METABOLIC PANEL - Abnormal; Notable for the following components:   Potassium 3.4 (*)    CO2 21 (*)    Glucose, Bld 111 (*)    Calcium 8.7 (*)    Albumin 3.1 (*)    All other  components within normal limits  MAGNESIUM  TSH  TROPONIN I (HIGH SENSITIVITY)  TROPONIN I (HIGH SENSITIVITY)    EKG None  Radiology CT Angio Chest PE W and/or Wo Contrast  Result Date: 12/06/2021 CLINICAL DATA:  Pulmonary embolism suspected. High probability due to tachycardia and left-sided chest pain that began this morning EXAM: CT ANGIOGRAPHY CHEST WITH CONTRAST TECHNIQUE: Multidetector CT imaging of the chest was performed using the standard protocol during bolus administration of  intravenous contrast. Multiplanar CT image reconstructions and MIPs were obtained to evaluate the vascular anatomy. RADIATION DOSE REDUCTION: This exam was performed according to the departmental dose-optimization program which includes automated exposure control, adjustment of the mA and/or kV according to patient size and/or use of iterative reconstruction technique. CONTRAST:  68mL OMNIPAQUE IOHEXOL 350 MG/ML SOLN COMPARISON:  Noncontrast chest CT 11/08/2021 FINDINGS: Cardiovascular: Satisfactory opacification of the pulmonary arteries to the segmental level. No evidence of pulmonary embolism. Normal heart size. No pericardial effusion. Atheromatous calcification of the aorta and coronaries. Irregular luminal plaque at the descending aorta. Mediastinum/Nodes: Negative for adenopathy Lungs/Pleura: Diffuse micro nodularity and fissure lobulation. Partially cavitary mass in the subpleural right lower lobe measuring 3.3 cm. No detected progression of these nodules but there is superimposed interlobular septal thickening. No pleural effusion. No focal consolidation. Upper Abdomen: No acute finding Musculoskeletal: No acute or aggressive finding. Other: 2 left breast masses. The lower and more medial now measures 18 mm as compared to 15 mm. Review of the MIP images confirms the above findings. IMPRESSION: 1. Negative for pulmonary embolism. 2. Mild interstitial edema superimposed on pulmonary metastatic disease with  lymphangitic component. 3. Two left breast masses, 1 measuring mildly larger than on CT last month. Electronically Signed   By: Jorje Guild M.D.   On: 12/06/2021 11:35   DG Chest Port 1 View  Result Date: 12/06/2021 CLINICAL DATA:  Shortness of breath.  Tachycardia. EXAM: PORTABLE CHEST 1 VIEW COMPARISON:  11/11/2021 and chest CT 08/02/2021 FINDINGS: Prominent interstitial lung markings appear to be chronic. In addition, there are tiny nodular densities in lungs and similar to the prior chest CT. No significant airspace disease or lung consolidation. Heart size is within normal limits and stable. Trachea is midline. Negative for a pneumothorax. No overt pulmonary edema. IMPRESSION: Chronic lung changes without acute findings. Electronically Signed   By: Markus Daft M.D.   On: 12/06/2021 10:02    Procedures Procedures    Medications Ordered in ED Medications  metoprolol tartrate (LOPRESSOR) tablet 25 mg (has no administration in time range)  sodium chloride 0.9 % bolus 500 mL (0 mLs Intravenous Stopped 12/06/21 1006)  diltiazem (CARDIZEM) injection 10 mg (10 mg Intravenous Given 12/06/21 0941)  morphine 2 MG/ML injection 2 mg (2 mg Intravenous Given 12/06/21 0945)  metoprolol tartrate (LOPRESSOR) injection 5 mg (5 mg Intravenous Given 12/06/21 1004)  HYDROmorphone (DILAUDID) injection 0.5 mg (0.5 mg Intravenous Given 12/06/21 1109)  iohexol (OMNIPAQUE) 350 MG/ML injection 100 mL (75 mLs Intravenous Contrast Given 12/06/21 1120)  potassium chloride SA (KLOR-CON M) CR tablet 40 mEq (40 mEq Oral Given 12/06/21 1506)    ED Course/ Medical Decision Making/ A&P  CRITICAL CARE Performed by: Milton Ferguson Total critical care time: 40 minutes Critical care time was exclusive of separately billable procedures and treating other patients. Critical care was necessary to treat or prevent imminent or life-threatening deterioration. Critical care was time spent personally by me on the following activities:  development of treatment plan with patient and/or surrogate as well as nursing, discussions with consultants, evaluation of patient's response to treatment, examination of patient, obtaining history from patient or surrogate, ordering and performing treatments and interventions, ordering and review of laboratory studies, ordering and review of radiographic studies, pulse oximetry and re-evaluation of patient's condition.    Patient with rapid A. fib.  Patient was initially given 20 mg of diltiazem and put on a drip.  Her rate only slowed down a little bit.  She  was then given 5 mg of Lopressor and she converted to normal sinus rhythm.  I have spoke with Dr. Harl Bowie cardiology and he recommended Lopressor 25 mg twice a day and they will see her in follow-up in the office.  I spoke with Dr. Delton Coombes and he would like the port placed this week if possible.  I spoke with Dr. Constance Haw and she is going to arrange to have the Port-A-Cath placed this Friday.                         Medical Decision Making Amount and/or Complexity of Data Reviewed Labs: ordered. Radiology: ordered.  Risk Prescription drug management.   Patient with rapid atrial fibs that is controlled on Lopressor she will be discharged home and follow-up with cardiology and oncology and Friday with surgery for her Port-A-Cath This patient presents to the ED for concern of palpitations, this involves an extensive number of treatment options, and is a complaint that carries with it a high risk of complications and morbidity.  The differential diagnosis includes atrial flutter, V. tach, PE, MI   Co morbidities that complicate the patient evaluation  Breast cancer and lung cancer   Additional history obtained:  Additional history obtained from relative External records from outside source obtained and reviewed including hospital record   Lab Tests:  I Ordered, and personally interpreted labs.  The pertinent results include: White  count elevated and D-dimer elevated   Imaging Studies ordered:  I ordered imaging studies including chest x-ray and CT angio I independently visualized and interpreted imaging which showed CT angio shows pulmonary metastatic disease but no PE I agree with the radiologist interpretation   Cardiac Monitoring:  The patient was maintained on a cardiac monitor.  I personally viewed and interpreted the cardiac monitored which showed an underlying rhythm of: Rapid atrial flutter   Medicines ordered and prescription drug management:  I ordered medication including Cardizem and Lopressor for rapid atrial fibs Reevaluation of the patient after these medicines showed that the patient improved I have reviewed the patients home medicines and have made adjustments as needed   Test Considered:  Cardiac cath  Critical Interventions:  IV Cardizem and Lopressor   Consultations Obtained:  I requested consultation with the cardiology, oncology, and general surgery,  and discussed lab and imaging findings as well as pertinent plan - they recommend: Cardiology recommended sending the patient home on Lopressor.,  Oncology recommended getting her Port-A-Cath on as soon as possible, general surgery will do the Port-A-Cath this Friday.   Problem List / ED Course:  Lung cancer breast cancer and rapid atrial fibs   Reevaluation:  After the interventions noted above, I reevaluated the patient and found that they have :improved   Social Determinants of Health:  None   Dispostion:  After consideration of the diagnostic results and the patients response to treatment, I feel that the patent would benefit from discharge home and put on Lopressor with an appointment to get the Port-A-Cath on Friday.         Final Clinical Impression(s) / ED Diagnoses Final diagnoses:  Paroxysmal atrial fibrillation ([place])    Rx / DC Orders ED Discharge Orders          Ordered    metoprolol tartrate  (LOPRESSOR) 25 MG tablet  2 times daily        12/06/21 1518              Errin Whitelaw,  Broadus John, MD 12/09/21 [phone]

## 2021-12-06 NOTE — ED Notes (Signed)
Pt and family verbalized understanding of discharge instructions

## 2021-12-06 NOTE — Discharge Instructions (Signed)
Dr. Harl Bowie is going to schedule an appointment for you to follow-up with him for your fast heart rate.  He is 1 the cardiologist here.  Dr. Constance Haw is going to reschedule your Port-A-Cath placement for this Friday.  They are going to get in touch with you

## 2021-12-06 NOTE — ED Triage Notes (Signed)
Pt brought over to ED from Short Stay with c/o heartrate up to 175bpm and in SVT. Pt was supposed to be having a port a cath placed this morning but when placed on cardiac monitor, pt was found to be in SVT.

## 2021-12-06 NOTE — Progress Notes (Signed)
Patient discussed with Dr Roderic Palau. Brought to ER from short stay undergoing plans for port placement for metastatic breast cancer as well as a lung cancer primary, during short stat assessment found to be in afib with RVR. In the ER received diltiazem 13m IV x 1 and lopressor 579mx 1, converted back to SR.   Hgb 11.6 Plt 423 K 3.4 Trop neg x 1 EKG 925AM aflutter CXR chronic lung changes CT PE no PE, metastatic lung disease, two breast masses    New diagnosis of aflutter. Converted in ER with IV dilt and lopressor. Would start oral lopressor 2586mid. CHADS2Vasc score is at least 3. 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 Can arrange outpatient echo and cardiology appointment.    JonCarlyle Dolly

## 2021-12-07 ENCOUNTER — Other Ambulatory Visit: Payer: Self-pay

## 2021-12-07 ENCOUNTER — Encounter (HOSPITAL_COMMUNITY): Payer: Self-pay

## 2021-12-08 ENCOUNTER — Encounter (HOSPITAL_COMMUNITY)
Admit: 2021-12-08 | Discharge: 2021-12-08 | Disposition: A | Discharge status: Home / Self Care | Payer: Medicare HMO | Attending: General Surgery | Admitting: General Surgery

## 2021-12-09 ENCOUNTER — Encounter: Payer: Self-pay | Admitting: Cardiology

## 2021-12-09 ENCOUNTER — Ambulatory Visit: Payer: Medicare HMO | Admitting: Cardiology

## 2021-12-09 ENCOUNTER — Other Ambulatory Visit: Payer: Self-pay

## 2021-12-09 VITALS — BP 110/58 | HR 133 | Ht 64.0 in | Wt 125.4 lb

## 2021-12-09 DIAGNOSIS — R9431 Abnormal electrocardiogram [ECG] [EKG]: Secondary | ICD-10-CM

## 2021-12-09 DIAGNOSIS — I4892 Unspecified atrial flutter: Secondary | ICD-10-CM | POA: Insufficient documentation

## 2021-12-09 DIAGNOSIS — I483 Typical atrial flutter: Secondary | ICD-10-CM | POA: Diagnosis not present

## 2021-12-09 DIAGNOSIS — I4891 Unspecified atrial fibrillation: Secondary | ICD-10-CM | POA: Diagnosis not present

## 2021-12-09 DIAGNOSIS — C50212 Malignant neoplasm of upper-inner quadrant of left female breast: Secondary | ICD-10-CM

## 2021-12-09 MED ORDER — METOPROLOL TARTRATE 50 MG PO TABS: 50.0000 mg | ORAL_TABLET | Freq: Two times a day (BID) | ORAL | 3 refills | Status: DC

## 2021-12-09 NOTE — Assessment & Plan Note (Signed)
Per oncology.  Avoiding anticoagulation at this time.  She may proceed with port placement.

## 2021-12-09 NOTE — Patient Instructions (Signed)
Medication Instructions:   Stop Taking Losartan  Increase Lopressor to 50 mg Two Times Daily   *If you need a refill on your cardiac medications before your next appointment, please call your pharmacy*   Lab Work: NONE  If you have labs (blood work) drawn today and your tests are completely normal, you will receive your results only by: [facility] (if you have MyChart) OR A paper copy in the mail If you have any lab test that is abnormal or we need to change your treatment, we will call you to review the results.   Testing/Procedures: Your physician has requested that you have an echocardiogram. Echocardiography is a painless test that uses sound waves to create images of your heart. It provides your doctor with information about the size and shape of your heart and how well your hearts chambers and valves are working. This procedure takes approximately one hour. There are no restrictions for this procedure.    Follow-Up: At [facility], you and your health needs are our priority.  As part of our continuing mission to provide you with exceptional heart care, we have created designated Provider Care Teams.  These Care Teams include your primary Cardiologist (physician) and Advanced Practice Providers (APPs -  Physician Assistants and Nurse Practitioners) who all work together to provide you with the care you need, when you need it.  We recommend signing up for the patient portal called "MyChart".  Sign up information is provided on this After Visit Summary.  MyChart is used to connect with patients for Virtual Visits (Telemedicine).  Patients are able to view lab/test results, encounter notes, upcoming appointments, etc.  Non-urgent messages can be sent to your provider as well.   To learn more about what you can do with MyChart, go to [email or web address].    Your next appointment:   4 -6 week(s)  The format for your next appointment:   In Person  Provider:   You will  see one of the following Advanced Practice Providers on your designated Care Team:   Bernerd Pho, PA-C  Ermalinda Barrios, PA-C   Other Instructions Thank you for choosing [facility]!

## 2021-12-09 NOTE — Progress Notes (Signed)
Cardiology Office Note:    Date:  12/09/2021   ID:  Elizabeth Wu, DOB 1951-04-05, MRN [id number]  PCP:  [name], DO   [facility] Providers Cardiologist:  None     Referring MD: [name], DO    History of Present Illness:    Elizabeth Wu is a 71 y.o. female here for ER follow-up atrial fibrillation.  During her assessment for port placement for metastatic breast cancer she was found to have rapid atrial flutter heart rate 175 bpm.  Lung cancer primary.  This was discussed with Dr. Harl Bowie.  Oral Lopressor 25 mg twice a day was started.  Her chads Vascor was at least 3.  She needed port placement and questionable brain met on MRI so anticoagulation was not started.  Outpatient echo will be ordered.  She has 2 breast masses.  Her troponin was negative.  Hemoglobin 11.6 EKG showed flutter.  EKG today 12/09/2021 shows sinus tachycardia with PACs heart rate 123 bpm. Prior EKG showed 175 bpm rapid atrial flutter.  Today she is in wheelchair.  Reports mild shortness of breath.  She sometimes has some chest discomfort when her heart is racing.  Mild cough.  Denies any bleeding.  Has bruising from ER, blood draws.  Past Medical History:  Diagnosis Date   Allergy    Cancer ([place])    Breast   Complication of anesthesia    DJD (degenerative joint disease)    Hyperlipidemia    Hypertension    Osteopenia    PONV (postoperative nausea and vomiting)     Past Surgical History:  Procedure Laterality Date   APPENDECTOMY     BRONCHIAL BIOPSY  11/11/2021   Procedure: BRONCHIAL BIOPSIES;  Surgeon: Collene Gobble, MD;  Location: [place];  Service: Pulmonary;;   BRONCHIAL BRUSHINGS  11/11/2021   Procedure: BRONCHIAL BRUSHINGS;  Surgeon: Collene Gobble, MD;  Location: [facility] ENDOSCOPY;  Service: Pulmonary;;   BRONCHIAL NEEDLE ASPIRATION BIOPSY  11/11/2021   Procedure: BRONCHIAL NEEDLE ASPIRATION BIOPSIES;  Surgeon: Collene Gobble, MD;  Location: [place] ENDOSCOPY;   Service: Pulmonary;;   FIDUCIAL MARKER PLACEMENT  11/11/2021   Procedure: FIDUCIAL MARKER PLACEMENT;  Surgeon: Collene Gobble, MD;  Location: [facility] ENDOSCOPY;  Service: Pulmonary;;   HERNIA REPAIR     TONSILLECTOMY     TONSILLECTOMY      Current Medications: Current Meds  Medication Sig   anastrozole (ARIMIDEX) 1 MG tablet Take 1 tablet (1 mg total) by mouth daily.   Cholecalciferol (VITAMIN D) 50 MCG (2000 UT) tablet Take 2,000 Units by mouth daily.   cyanocobalamin (,VITAMIN B-12,) 1000 MCG/ML injection Inject 1,000 mcg into the muscle every 30 (thirty) days.   docusate sodium (COLACE) 100 MG capsule Take 100 mg by mouth 2 (two) times daily.   Ensure (ENSURE) Take 237 mLs by mouth 2 (two) times daily between meals.   fluconazole (DIFLUCAN) 100 MG tablet Take 1 tablet (100 mg total) by mouth daily. Take two tabs on day one followed by one tablet daily.   folic acid (FOLVITE) 1 MG tablet Take 1 mg by mouth daily.   HYDROcodone-acetaminophen (NORCO) 10-325 MG tablet Take 1 tablet by mouth every 6 (six) hours as needed.   megestrol (MEGACE) 400 MG/10ML suspension Take 10 mLs (400 mg total) by mouth 2 (two) times daily.   metoprolol tartrate (LOPRESSOR) 50 MG tablet Take 1 tablet (50 mg total) by mouth 2 (two) times daily.   Omega-3 Fatty Acids (  FISH OIL) 1000 MG CAPS Take 1,000 mg by mouth daily.   prochlorperazine (COMPAZINE) 10 MG tablet Take 1 tablet (10 mg total) by mouth every 6 (six) hours as needed for nausea or vomiting.   simvastatin (ZOCOR) 20 MG tablet Take 1 tablet (20 mg total) by mouth at bedtime.   [DISCONTINUED] losartan (COZAAR) 50 MG tablet TAKE 1 TABLET EVERY DAY   [DISCONTINUED] metoprolol tartrate (LOPRESSOR) 25 MG tablet Take 1 tablet (25 mg total) by mouth 2 (two) times daily.     Allergies:   Patient has no known allergies.   Social History   Socioeconomic History   Marital status: Single    Spouse name: Not on file   Number of children: Not on file   Years of  education: Not on file   Highest education level: 12th grade  Occupational History   Occupation: Quality Control  Tobacco Use   Smoking status: Every Day    Packs/day: 0.30    Years: 55.00    Pack years: 16.50    Types: Cigarettes   Smokeless tobacco: Never  Vaping Use   Vaping Use: Never used  Substance and Sexual Activity   Alcohol use: No   Drug use: No   Sexual activity: Not on file  Other Topics Concern   Not on file  Social History Narrative   Not on file   Social Determinants of Health   Financial Resource Strain: Not on file  Food Insecurity: Not on file  Transportation Needs: Not on file  Physical Activity: Not on file  Stress: Not on file  Social Connections: Not on file     Family History: The patient's family history includes Alzheimer's disease in her mother; Cancer in her father; Diabetes in her sister; Heart disease in her brother.  ROS:   Please see the history of present illness.    No fevers chills nausea vomiting syncope bleeding all other systems reviewed and are negative.  EKGs/Labs/Other Studies Reviewed:    The following studies were reviewed today: CT scan of chest personally reviewed, no PE, coronary artery calcification fairly diffuse noted.  No pericardial effusion.  EKG:  EKG is  ordered today.  The ekg ordered today demonstrates sinus tachycardia 129 with PACs.  Recent Labs: 12/06/2021: ALT 38; BUN 19; Creatinine, Ser 0.64; Hemoglobin 11.6; Magnesium 1.9; Platelets 423; Potassium 3.4; Sodium 136; TSH 2.382  Recent Lipid Panel    Component Value Date/Time   CHOL 191 06/30/2021 1008   CHOL 266 (H) 05/31/2013 0927   TRIG 197 (H) 06/30/2021 1008   TRIG 177 (H) 05/31/2013 0927   HDL 48 06/30/2021 1008   HDL 42 05/31/2013 0927   CHOLHDL 4.0 06/30/2021 1008   LDLCALC 109 (H) 06/30/2021 1008   LDLCALC 189 (H) 05/31/2013 0927     Risk Assessment/Calculations:              Physical Exam:    VS:  BP (!) 110/58    Pulse (!) 133     Ht 5' 4"  (1.626 m)    Wt 125 lb 6.4 oz (56.9 kg)    SpO2 93%    BMI 21.52 kg/m     Wt Readings from Last 3 Encounters:  12/09/21 125 lb 6.4 oz (56.9 kg)  12/07/21 122 lb 3.2 oz (55.4 kg)  12/06/21 122 lb 3.2 oz (55.4 kg)     GEN:  Well nourished, well developed in no acute distress HEENT: Normal NECK: No JVD; No carotid bruits LYMPHATICS:  No lymphadenopathy CARDIAC: Tachy with ectopy, no murmurs, no rubs, gallops RESPIRATORY:  Clear to auscultation without rales, wheezing or rhonchi  ABDOMEN: Soft, non-tender, non-distended MUSCULOSKELETAL:  No edema; No deformity  SKIN: Warm and dry NEUROLOGIC:  Alert and oriented x 3 PSYCHIATRIC:  Normal affect   ASSESSMENT:    1. Atrial fibrillation, unspecified type ([place])   2. Typical atrial flutter ([place])   3. Nonspecific abnormal electrocardiogram (ECG) (EKG)   4. Malignant neoplasm of upper-inner quadrant of left female breast, unspecified estrogen receptor status ([place])    PLAN:    In order of problems listed above:  Atrial flutter ([place]) Seen on ECG in the emergency department 175 bpm rapid atrial flutter.  Converted to sinus tachycardia with PACs.  Agree with no anticoagulation given her ongoing need for port, as well as potential brain metastasis.  Continue to reassess.  Since she is currently sinus tachycardia 129, I will increase her metoprolol to 31m BID.  Malignant neoplasm of upper-inner quadrant of left female breast ([facility] Per oncology.  Avoiding anticoagulation at this time.  She may proceed with port placement.         Medication Adjustments/Labs and Tests Ordered: Current medicines are reviewed at length with the patient today.  Concerns regarding medicines are outlined above.  Orders Placed This Encounter  Procedures   EKG 12-Lead   ECHOCARDIOGRAM COMPLETE   Meds ordered this encounter  Medications   metoprolol tartrate (LOPRESSOR) 50 MG tablet    Sig: Take 1 tablet (50 mg total) by mouth 2 (two) times  daily.    Dispense:  180 tablet    Refill:  3    Patient Instructions  Medication Instructions:   Stop Taking Losartan  Increase Lopressor to 50 mg Two Times Daily   *If you need a refill on your cardiac medications before your next appointment, please call your pharmacy*   Lab Work: NONE  If you have labs (blood work) drawn today and your tests are completely normal, you will receive your results only by: MyChart Message (if you have MyChart) OR A paper copy in the mail If you have any lab test that is abnormal or we need to change your treatment, we will call you to review the results.   Testing/Procedures: Your physician has requested that you have an echocardiogram. Echocardiography is a painless test that uses sound waves to create images of your heart. It provides your doctor with information about the size and shape of your heart and how well your hearts chambers and valves are working. This procedure takes approximately one hour. There are no restrictions for this procedure.    Follow-Up: At [facility] you and your health needs are our priority.  As part of our continuing mission to provide you with exceptional heart care, we have created designated Provider Care Teams.  These Care Teams include your primary Cardiologist (physician) and Advanced Practice Providers (APPs -  Physician Assistants and Nurse Practitioners) who all work together to provide you with the care you need, when you need it.  We recommend signing up for the patient portal called "MyChart".  Sign up information is provided on this After Visit Summary.  MyChart is used to connect with patients for Virtual Visits (Telemedicine).  Patients are able to view lab/test results, encounter notes, upcoming appointments, etc.  Non-urgent messages can be sent to your provider as well.   To learn more about what you can do with MyChart, go to [email or web address]  Your next appointment:   4 -6  week(s)  The format for your next appointment:   In Person  Provider:   You will see one of the following Advanced Practice Providers on your designated Care Team:   Bernerd Pho, PA-C  Ermalinda Barrios, PA-C   Other Instructions Thank you for choosing [facility]!       Signed, Candee Furbish, MD  12/09/2021 3:05 PM     [facility]

## 2021-12-09 NOTE — Assessment & Plan Note (Signed)
Seen on ECG in the emergency department 175 bpm rapid atrial flutter.  Converted to sinus tachycardia with PACs.  Agree with no anticoagulation given her ongoing need for port, as well as potential brain metastasis.  Continue to reassess.  Since she is currently sinus tachycardia 129, I will increase her metoprolol to 50mg  BID.

## 2021-12-10 ENCOUNTER — Ambulatory Visit (HOSPITAL_COMMUNITY): Payer: Medicare HMO | Admitting: Anesthesiology

## 2021-12-10 ENCOUNTER — Ambulatory Visit (HOSPITAL_COMMUNITY): Payer: Medicare HMO

## 2021-12-10 ENCOUNTER — Encounter (HOSPITAL_COMMUNITY): Payer: Self-pay

## 2021-12-10 ENCOUNTER — Encounter (HOSPITAL_COMMUNITY)
Admission: RE | Disposition: A | Discharge status: Home / Self Care | Payer: Self-pay | Source: Home / Self Care | Attending: General Surgery

## 2021-12-10 ENCOUNTER — Encounter (HOSPITAL_COMMUNITY): Payer: Self-pay | Admitting: General Surgery

## 2021-12-10 ENCOUNTER — Ambulatory Visit (HOSPITAL_COMMUNITY)
Admission: RE | Admit: 2021-12-10 | Discharge: 2021-12-10 | Disposition: A | Discharge status: Home / Self Care | Payer: Medicare HMO | Attending: General Surgery | Admitting: General Surgery

## 2021-12-10 DIAGNOSIS — F17210 Nicotine dependence, cigarettes, uncomplicated: Secondary | ICD-10-CM | POA: Diagnosis not present

## 2021-12-10 DIAGNOSIS — C799 Secondary malignant neoplasm of unspecified site: Secondary | ICD-10-CM | POA: Insufficient documentation

## 2021-12-10 DIAGNOSIS — I10 Essential (primary) hypertension: Secondary | ICD-10-CM | POA: Diagnosis not present

## 2021-12-10 DIAGNOSIS — C50412 Malignant neoplasm of upper-outer quadrant of left female breast: Secondary | ICD-10-CM | POA: Diagnosis not present

## 2021-12-10 DIAGNOSIS — C50912 Malignant neoplasm of unspecified site of left female breast: Secondary | ICD-10-CM | POA: Diagnosis not present

## 2021-12-10 DIAGNOSIS — C50411 Malignant neoplasm of upper-outer quadrant of right female breast: Secondary | ICD-10-CM

## 2021-12-10 DIAGNOSIS — C50911 Malignant neoplasm of unspecified site of right female breast: Secondary | ICD-10-CM | POA: Insufficient documentation

## 2021-12-10 DIAGNOSIS — C50212 Malignant neoplasm of upper-inner quadrant of left female breast: Secondary | ICD-10-CM | POA: Diagnosis present

## 2021-12-10 DIAGNOSIS — C50919 Malignant neoplasm of unspecified site of unspecified female breast: Secondary | ICD-10-CM | POA: Diagnosis not present

## 2021-12-10 DIAGNOSIS — C3490 Malignant neoplasm of unspecified part of unspecified bronchus or lung: Secondary | ICD-10-CM

## 2021-12-10 DIAGNOSIS — Z95828 Presence of other vascular implants and grafts: Secondary | ICD-10-CM

## 2021-12-10 HISTORY — PX: PORTACATH PLACEMENT: SHX2246

## 2021-12-10 IMAGING — DX DG CHEST 1V PORT
1 series · 1 of 1 positions shown · non-contrast
Comparison: Chest CTA [DATE] and earlier.

CLINICAL DATA: 70-year-old female left Port-A-Cath placement.
Metastatic breast cancer.

EXAM:
PORTABLE CHEST 1 VIEW

[chest ap]
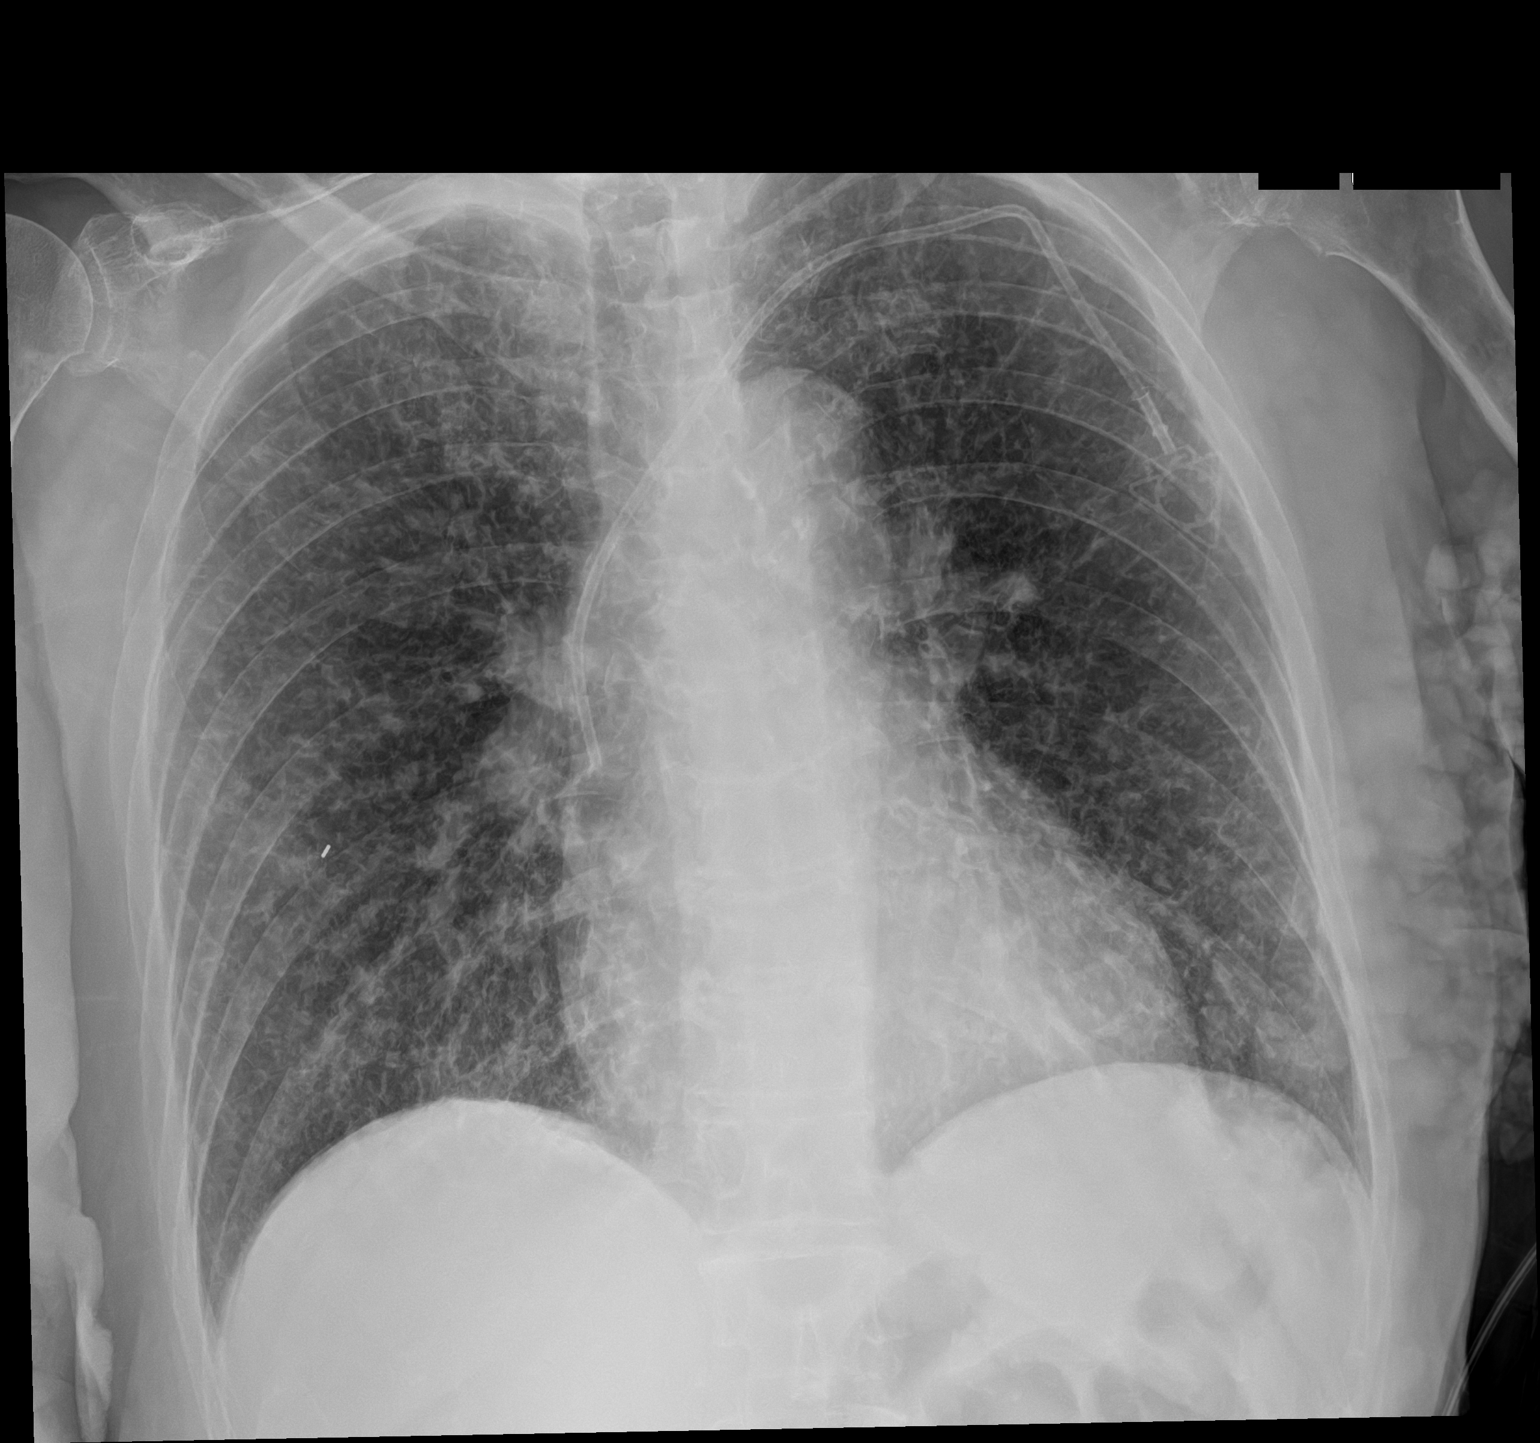

[1 of 1 positions shown; findings below may reference images not displayed]

FINDINGS: Portable AP semi upright view at [T1] hours. Left subclavian
approach power port placed. No pneumothorax or adverse features
identified. Stable cardiac size and mediastinal contours. Extensive
bilateral reticulonodular abnormal pulmonary opacity persists. No
pleural effusion or consolidation. Negative visible bowel gas.
Stable visualized osseous structures.
IMPRESSION: 1. Left chest Port-A-Cath with no adverse features.
2. Ongoing diffuse pulmonary reticulonodular opacity, nonspecific
but suspicious for lymphangitic carcinomatosis in this setting.

## 2021-12-10 SURGERY — INSERTION, TUNNELED CENTRAL VENOUS DEVICE, WITH PORT
Anesthesia: General | Site: Chest | Laterality: Left

## 2021-12-10 MED ORDER — CHLORHEXIDINE GLUCONATE CLOTH 2 % EX PADS: 6.0000 | MEDICATED_PAD | Freq: Once | CUTANEOUS | Status: DC

## 2021-12-10 MED ORDER — ONDANSETRON HCL 4 MG/2ML IJ SOLN
INTRAMUSCULAR | Status: AC
  Administered 2021-12-10: 4 mg
  Filled 2021-12-10: qty 2

## 2021-12-10 MED ORDER — LIDOCAINE HCL (PF) 2 % IJ SOLN
INTRAMUSCULAR | Status: AC
  Filled 2021-12-10: qty 5

## 2021-12-10 MED ORDER — SODIUM CHLORIDE (PF) 0.9 % IJ SOLN
INTRAMUSCULAR | Status: DC | PRN
  Administered 2021-12-10: 500 mL

## 2021-12-10 MED ORDER — ORAL CARE MOUTH RINSE: 15.0000 mL | Freq: Once | OROMUCOSAL | Status: DC

## 2021-12-10 MED ORDER — FENTANYL CITRATE (PF) 100 MCG/2ML IJ SOLN
INTRAMUSCULAR | Status: AC
  Filled 2021-12-10: qty 2

## 2021-12-10 MED ORDER — PHENYLEPHRINE 40 MCG/ML (10ML) SYRINGE FOR IV PUSH (FOR BLOOD PRESSURE SUPPORT)
PREFILLED_SYRINGE | INTRAVENOUS | Status: DC | PRN
  Administered 2021-12-10 (×4): 80 ug via INTRAVENOUS

## 2021-12-10 MED ORDER — FENTANYL CITRATE (PF) 100 MCG/2ML IJ SOLN
INTRAMUSCULAR | Status: DC | PRN
Start: 2021-12-10 — End: 2021-12-10
  Administered 2021-12-10: 50 ug via INTRAVENOUS

## 2021-12-10 MED ORDER — LIDOCAINE HCL (PF) 1 % IJ SOLN
INTRAMUSCULAR | Status: AC
  Filled 2021-12-10: qty 30

## 2021-12-10 MED ORDER — LACTATED RINGERS IV SOLN: INTRAVENOUS | Status: DC

## 2021-12-10 MED ORDER — HEPARIN SOD (PORK) LOCK FLUSH 100 UNIT/ML IV SOLN
INTRAVENOUS | Status: AC
  Filled 2021-12-10: qty 5

## 2021-12-10 MED ORDER — PROPOFOL 500 MG/50ML IV EMUL
INTRAVENOUS | Status: DC | PRN
  Administered 2021-12-10: 200 ug/kg/min via INTRAVENOUS

## 2021-12-10 MED ORDER — ONDANSETRON HCL 4 MG/2ML IJ SOLN: 4.0000 mg | Freq: Once | INTRAMUSCULAR | Status: DC

## 2021-12-10 MED ORDER — PHENYLEPHRINE 40 MCG/ML (10ML) SYRINGE FOR IV PUSH (FOR BLOOD PRESSURE SUPPORT)
PREFILLED_SYRINGE | INTRAVENOUS | Status: AC
  Filled 2021-12-10: qty 10

## 2021-12-10 MED ORDER — CHLORHEXIDINE GLUCONATE 0.12 % MT SOLN: 15.0000 mL | Freq: Once | OROMUCOSAL | Status: DC

## 2021-12-10 MED ORDER — CEFAZOLIN SODIUM-DEXTROSE 2-4 GM/100ML-% IV SOLN
INTRAVENOUS | Status: AC
  Filled 2021-12-10: qty 100

## 2021-12-10 MED ORDER — HEPARIN SOD (PORK) LOCK FLUSH 100 UNIT/ML IV SOLN
INTRAVENOUS | Status: DC | PRN
  Administered 2021-12-10: 500 [IU] via INTRAVENOUS

## 2021-12-10 MED ORDER — CEFAZOLIN SODIUM-DEXTROSE 2-4 GM/100ML-% IV SOLN
2.0000 g | INTRAVENOUS | Status: AC
  Administered 2021-12-10: 2 g via INTRAVENOUS

## 2021-12-10 MED ORDER — SUGAMMADEX SODIUM 500 MG/5ML IV SOLN
INTRAVENOUS | Status: AC
  Filled 2021-12-10: qty 5

## 2021-12-10 MED ORDER — PROPOFOL 10 MG/ML IV BOLUS
INTRAVENOUS | Status: AC
  Filled 2021-12-10: qty 20

## 2021-12-10 MED ORDER — LIDOCAINE HCL (PF) 1 % IJ SOLN
INTRAMUSCULAR | Status: DC | PRN
  Administered 2021-12-10: 10 mL

## 2021-12-10 SURGICAL SUPPLY — 33 items
APPLICATOR CHLORAPREP 10.5 ORG (MISCELLANEOUS) ×2 IMPLANT
BAG DECANTER FOR FLEXI CONT (MISCELLANEOUS) ×2 IMPLANT
CLOTH BEACON ORANGE TIMEOUT ST (SAFETY) ×2 IMPLANT
COVER LIGHT HANDLE STERIS (MISCELLANEOUS) ×4 IMPLANT
COVER PROBE U/S 5X48 (MISCELLANEOUS) ×1 IMPLANT
DECANTER SPIKE VIAL GLASS SM (MISCELLANEOUS) ×2 IMPLANT
DERMABOND ADVANCED (GAUZE/BANDAGES/DRESSINGS) ×1
DERMABOND ADVANCED .7 DNX12 (GAUZE/BANDAGES/DRESSINGS) ×1 IMPLANT
DRAPE C-ARM FOLDED MOBILE STRL (DRAPES) ×2 IMPLANT
ELECT REM PT RETURN 9FT ADLT (ELECTROSURGICAL) ×2
ELECTRODE REM PT RTRN 9FT ADLT (ELECTROSURGICAL) ×1 IMPLANT
GAUZE 4X4 16PLY ~~LOC~~+RFID DBL (SPONGE) ×1 IMPLANT
GLOVE SURG ENC MOIS LTX SZ6.5 (GLOVE) ×2 IMPLANT
GLOVE SURG LTX SZ6.5 (GLOVE) ×1 IMPLANT
GLOVE SURG POLYISO LF SZ7 (GLOVE) ×2 IMPLANT
GLOVE SURG UNDER POLY LF SZ7 (GLOVE) ×4 IMPLANT
GOWN STRL REUS W/TWL LRG LVL3 (GOWN DISPOSABLE) ×4 IMPLANT
IV NS 500ML (IV SOLUTION) ×2
IV NS 500ML BAXH (IV SOLUTION) ×1 IMPLANT
KIT PORT POWER 8FR ISP MRI (Port) ×2 IMPLANT
KIT TURNOVER KIT A (KITS) ×2 IMPLANT
MANIFOLD NEPTUNE II (INSTRUMENTS) ×2 IMPLANT
NDL HYPO 25X1 1.5 SAFETY (NEEDLE) ×1 IMPLANT
NEEDLE HYPO 25X1 1.5 SAFETY (NEEDLE) ×2 IMPLANT
PACK MINOR (CUSTOM PROCEDURE TRAY) ×2 IMPLANT
PAD ARMBOARD 7.5X6 YLW CONV (MISCELLANEOUS) ×2 IMPLANT
SET BASIN LINEN APH (SET/KITS/TRAYS/PACK) ×2 IMPLANT
SUT MNCRL AB 4-0 PS2 18 (SUTURE) ×2 IMPLANT
SUT PROLENE 2 0 SH 30 (SUTURE) ×2 IMPLANT
SUT VIC AB 3-0 SH 27 (SUTURE) ×2
SUT VIC AB 3-0 SH 27X BRD (SUTURE) ×1 IMPLANT
SYR 10ML LL (SYRINGE) ×4 IMPLANT
SYR CONTROL 10ML LL (SYRINGE) ×2 IMPLANT

## 2021-12-10 NOTE — Anesthesia Postprocedure Evaluation (Signed)
Anesthesia Post Note  Patient: Elizabeth Wu  Procedure(s) Performed: INSERTION PORT-A-CATH (Left: Chest)  Patient location during evaluation: PACU Anesthesia Type: General Level of consciousness: awake and alert Pain management: pain level controlled Vital Signs Assessment: post-procedure vital signs reviewed and stable Respiratory status: spontaneous breathing, nonlabored ventilation, respiratory function stable and patient connected to nasal cannula oxygen Cardiovascular status: stable and blood pressure returned to baseline Postop Assessment: no apparent nausea or vomiting Anesthetic complications: no   No notable events documented.   Last Vitals:  Vitals:   12/10/21 1015 12/10/21 1032  BP: 122/79 (!) 144/90  Pulse: 83 87  Resp: 16 18  Temp:  36.6 C  SpO2: 100% 99%    Last Pain:  Vitals:   12/10/21 1032  TempSrc: Oral  PainSc:                  Trixie Rude

## 2021-12-10 NOTE — Anesthesia Preprocedure Evaluation (Signed)
Anesthesia Evaluation  Patient identified by MRN, date of birth, ID band Patient awake    Reviewed: Allergy & Precautions, NPO status , Patient's Chart, lab work & pertinent test results  History of Anesthesia Complications (+) PONV  Airway Mallampati: II  TM Distance: >3 FB Neck ROM: Full    Dental  (+) Edentulous Upper, Edentulous Lower   Pulmonary neg pulmonary ROS, Current Smoker and Patient abstained from smoking.,    Pulmonary exam normal breath sounds clear to auscultation       Cardiovascular hypertension, Pt. on medications Normal cardiovascular exam+ dysrhythmias (extreme sinus tach)  Rhythm:Regular Rate:Normal     Neuro/Psych negative neurological ROS  negative psych ROS   GI/Hepatic negative GI ROS, Neg liver ROS,   Endo/Other  negative endocrine ROS  Renal/GU negative Renal ROS  negative genitourinary   Musculoskeletal  (+) Arthritis , Osteoarthritis,    Abdominal   Peds negative pediatric ROS (+)  Hematology negative hematology ROS (+)   Anesthesia Other Findings Metastatic Breast Cancer  Reproductive/Obstetrics negative OB ROS                             Anesthesia Physical  Anesthesia Plan  ASA: 3  Anesthesia Plan: General   Post-op Pain Management:    Induction: Intravenous  PONV Risk Score and Plan: 2 and Dexamethasone, Ondansetron and TIVA  Airway Management Planned: Natural Airway and Nasal Cannula  Additional Equipment:   Intra-op Plan:   Post-operative Plan:   Informed Consent: I have reviewed the patients History and Physical, chart, labs and discussed the procedure including the risks, benefits and alternatives for the proposed anesthesia with the patient or authorized representative who has indicated his/her understanding and acceptance.     Dental advisory given  Plan Discussed with: CRNA  Anesthesia Plan Comments:          Anesthesia Quick Evaluation

## 2021-12-10 NOTE — Op Note (Signed)
Operative Note 12/10/21   Preoperative Diagnosis: Bilateral breast cancer, lung cancer    Postoperative Diagnosis: Same   Procedure(s) Performed: Port-A-Cath placement,  left subclavian    Surgeon: Lanell Matar. Constance Haw, MD   Assistants: No qualified resident was available   Anesthesia: Monitored anesthesia care   Anesthesiologist: Trixie Rude, MD    Specimens: None   Estimated Blood Loss: Minimal   Fluoroscopy time: 5 seconds   Blood Replacement: None    Complications: None    Operative Findings: Normal anatomy   Indications: Ms. Bensman is a 71 yo with bilateral breast cancer and lung cancer. She is getting chemotherapy for treatment. Discussed risk of bleeding, infection, pneumothorax, malfunction of port.   Procedure: The patient was brought into the operating room and monitored anesthesia care was induced.  One percent lidocaine was used for local anesthesia.   The left chest and neck was prepped and draped in the usual sterile fashion.  Preoperative antibiotics were given.  An incision was made below the left clavicle. A subcutaneous pocket was formed. The needles advanced into the subclavian vein using the Seldinger technique after adjusting her arm and making her head down. A guidewire was then advanced into the right atrium under fluoroscopic guidance.  Ectopia was noted at baseline and additional was not seen. An introducer and peel-away sheath were placed over the guidewire. The catheter was then inserted through the peel-away sheath and the peel-away sheath was removed.  A spot film was performed to confirm the position. The catheter was then attached to the port and the port placed in subcutaneous pocket. Adequate positioning was confirmed by fluoroscopy. Hemostasis was confirmed, and the port was secured with 2-0 prolene sutures.  Good backflow of blood was noted on aspiration of the port. The port was flushed with heparin flush. Subcutaneous layer was reapproximated using  a 3-0 Vicryl interrupted suture. The skin was closed using a 4-0 Vicryl subcuticular suture. Dermabond was applied.    All tape and needle counts were correct at the end of the procedure. The patient was transferred to PACU in stable condition. A chest x-ray will be performed at that time.  Curlene Labrum, MD [facility] [address] [place], [place] [address] [phone] (office)

## 2021-12-10 NOTE — Discharge Instructions (Signed)
Keep area clean and dry. You can take a shower in 24 hours. Do not submerge in water.  Take tylenol and ibuprofen for pain control and take your home narcotics as prescribed. You just had 90 pills of Roxicodone 10 mg filled this month.

## 2021-12-10 NOTE — Transfer of Care (Signed)
Immediate Anesthesia Transfer of Care Note  Patient: Elizabeth Wu  Procedure(s) Performed: INSERTION PORT-A-CATH (Left: Chest)  Patient Location: PACU  Anesthesia Type:MAC  Level of Consciousness: awake, alert , oriented and patient cooperative  Airway & Oxygen Therapy: Patient Spontanous Breathing and Patient connected to nasal cannula oxygen  Post-op Assessment: Report given to RN, Post -op Vital signs reviewed and stable and Patient moving all extremities  Post vital signs: Reviewed and stable  Last Vitals:  Vitals Value Taken Time  BP 118/69 12/10/21 1000  Temp 36.7 C 12/10/21 0956  Pulse 73 12/10/21 1000  Resp 13 12/10/21 1000  SpO2 100 % 12/10/21 1000  Vitals shown include unvalidated device data.  Last Pain:  Vitals:   12/10/21 0817  PainSc: 7       Patients Stated Pain Goal: 3 (82/57/49 3552)  Complications: No notable events documented.

## 2021-12-10 NOTE — Progress Notes (Signed)
[facility]  Updated sister. She has narcotics she just filled. No additional rx needed. Follow up PRN.   Curlene Labrum, MD [facility] [address] [place], [place] [address] [phone] (office)

## 2021-12-10 NOTE — H&P (Signed)
[facility] History and Physical    Elizabeth Wu is a 71 y.o. female.  HPI: Elizabeth Wu is a 71 yo who has bilateral breast cancer and lung cancer with metastatic disease. She needs a port placed. She came into the hospital Monday to get this but had tachycardia/ A fib and was placed on Lopressor after converting in the ED with Cardizem. Cardiology said she could go home and she was rescheduled.   Past Medical History:  Diagnosis Date   Allergy    Cancer ([place])    Breast   Complication of anesthesia    DJD (degenerative joint disease)    Hyperlipidemia    Hypertension    Osteopenia    PONV (postoperative nausea and vomiting)     Past Surgical History:  Procedure Laterality Date   APPENDECTOMY     BRONCHIAL BIOPSY  11/11/2021   Procedure: BRONCHIAL BIOPSIES;  Surgeon: Collene Gobble, MD;  Location: [facility] ENDOSCOPY;  Service: Pulmonary;;   BRONCHIAL BRUSHINGS  11/11/2021   Procedure: BRONCHIAL BRUSHINGS;  Surgeon: Collene Gobble, MD;  Location: [facility] ENDOSCOPY;  Service: Pulmonary;;   BRONCHIAL NEEDLE ASPIRATION BIOPSY  11/11/2021   Procedure: BRONCHIAL NEEDLE ASPIRATION BIOPSIES;  Surgeon: Collene Gobble, MD;  Location: [place] ENDOSCOPY;  Service: Pulmonary;;   FIDUCIAL MARKER PLACEMENT  11/11/2021   Procedure: FIDUCIAL MARKER PLACEMENT;  Surgeon: Collene Gobble, MD;  Location: [facility] ENDOSCOPY;  Service: Pulmonary;;   HERNIA REPAIR     TONSILLECTOMY     TONSILLECTOMY      Family History  Problem Relation Age of Onset   Alzheimer's disease Mother    Cancer Father        stomach cancer   Diabetes Sister    Heart disease Brother        MI    Social History   Tobacco Use   Smoking status: Every Day    Packs/day: 0.30    Years: 55.00    Pack years: 16.50    Types: Cigarettes   Smokeless tobacco: Never  Vaping Use   Vaping Use: Never used  Substance Use Topics   Alcohol use: No   Drug use: No    Medications: I have reviewed the patient's current  medications. Current Facility-Administered Medications  Medication Dose Route Frequency Provider Last Rate Last Admin   ceFAZolin (ANCEF) 2-4 GM/100ML-% IVPB            ceFAZolin (ANCEF) IVPB 2g/100 mL premix  2 g Intravenous On Call to OR Virl Cagey, MD       chlorhexidine (PERIDEX) 0.12 % solution 15 mL  15 mL Mouth/Throat Once Pugh, Ellyn Hack, MD       Or   MEDLINE mouth rinse  15 mL Mouth Rinse Once Pugh, Ellyn Hack, MD       Chlorhexidine Gluconate Cloth 2 % PADS 6 each  6 each Topical Once Virl Cagey, MD       And   Chlorhexidine Gluconate Cloth 2 % PADS 6 each  6 each Topical Once Virl Cagey, MD       lactated ringers infusion   Intravenous Continuous Trixie Rude, MD 10 mL/hr at 12/10/21 0832 New Bag at 12/10/21 0832   ondansetron (ZOFRAN) injection 4 mg  4 mg Intravenous Once Trixie Rude, MD        Medications Prior to Admission  Medication Sig Dispense Refill   anastrozole (ARIMIDEX) 1 MG tablet Take 1 tablet (1 mg  total) by mouth daily. 30 tablet 6   metoprolol tartrate (LOPRESSOR) 50 MG tablet Take 1 tablet (50 mg total) by mouth 2 (two) times daily. 180 tablet 3   Cholecalciferol (VITAMIN D) 50 MCG (2000 UT) tablet Take 2,000 Units by mouth daily.     cyanocobalamin (,VITAMIN B-12,) 1000 MCG/ML injection Inject 1,000 mcg into the muscle every 30 (thirty) days.     docusate sodium (COLACE) 100 MG capsule Take 100 mg by mouth 2 (two) times daily.     Ensure (ENSURE) Take 237 mLs by mouth 2 (two) times daily between meals.     fluconazole (DIFLUCAN) 100 MG tablet Take 1 tablet (100 mg total) by mouth daily. Take two tabs on day one followed by one tablet daily. 8 tablet 0   folic acid (FOLVITE) 1 MG tablet Take 1 mg by mouth daily.     HYDROcodone-acetaminophen (NORCO) 10-325 MG tablet Take 1 tablet by mouth every 6 (six) hours as needed. 120 tablet 0   ibuprofen (ADVIL) 200 MG tablet Take 400 mg by mouth every 6 (six) hours as needed for moderate pain.  (Patient not taking: Reported on 12/02/2021)     megestrol (MEGACE) 400 MG/10ML suspension Take 10 mLs (400 mg total) by mouth 2 (two) times daily. 480 mL 3   Omega-3 Fatty Acids (FISH OIL) 1000 MG CAPS Take 1,000 mg by mouth daily.     Oxycodone HCl 10 MG TABS Take 1 tablet (10 mg total) by mouth every 6 (six) hours as needed. (Patient not taking: Reported on 12/09/2021) 90 tablet 0   prochlorperazine (COMPAZINE) 10 MG tablet Take 1 tablet (10 mg total) by mouth every 6 (six) hours as needed for nausea or vomiting. 30 tablet 0   simvastatin (ZOCOR) 20 MG tablet Take 1 tablet (20 mg total) by mouth at bedtime. 90 tablet 3    No Known Allergies    ROS:  A comprehensive review of systems was negative except for: Respiratory: positive for sob Cardiovascular: positive for tachycardia  Blood pressure 126/64, pulse 80, SpO2 97 %. Physical Exam Vitals reviewed.  HENT:     Head: Normocephalic.     Nose: Nose normal.  Eyes:     Extraocular Movements: Extraocular movements intact.  Cardiovascular:     Rate and Rhythm: Normal rate.  Pulmonary:     Effort: Pulmonary effort is normal.  Abdominal:     General: There is no distension.     Palpations: Abdomen is soft.     Tenderness: There is no abdominal tenderness.  Musculoskeletal:        General: Normal range of motion.  Skin:    General: Skin is warm.  Neurological:     General: No focal deficit present.     Mental Status: She is alert.  Psychiatric:        Mood and Affect: Mood normal.        Behavior: Behavior normal.    Results: None  Assessment & Plan:  Elizabeth Wu is a 71 y.o. female with bilateral breast cancer, lung cancer. She is right handed. Will plan for left side.   Discussed risk of bleeding, infection, pneumothorax, and malfunction of the line, use of fluoroscopy and ultrasound.    All questions were answered to the satisfaction of the patient.  Virl Cagey 12/10/2021, 8:48 AM

## 2021-12-13 ENCOUNTER — Inpatient Hospital Stay (HOSPITAL_COMMUNITY): Payer: Medicare HMO | Admitting: Hematology

## 2021-12-13 ENCOUNTER — Inpatient Hospital Stay (HOSPITAL_COMMUNITY): Payer: Medicare HMO | Admitting: Dietician

## 2021-12-13 ENCOUNTER — Encounter (HOSPITAL_COMMUNITY): Payer: Self-pay | Admitting: General Surgery

## 2021-12-13 ENCOUNTER — Other Ambulatory Visit: Payer: Self-pay

## 2021-12-13 ENCOUNTER — Encounter (HOSPITAL_COMMUNITY): Payer: Self-pay | Admitting: *Deleted

## 2021-12-13 VITALS — BP 102/67 | HR 103 | Temp 96.7°F | Resp 18 | Ht 64.0 in | Wt 122.9 lb

## 2021-12-13 DIAGNOSIS — C792 Secondary malignant neoplasm of skin: Secondary | ICD-10-CM | POA: Diagnosis not present

## 2021-12-13 DIAGNOSIS — Z170 Estrogen receptor positive status [ER+]: Secondary | ICD-10-CM | POA: Diagnosis not present

## 2021-12-13 DIAGNOSIS — C50212 Malignant neoplasm of upper-inner quadrant of left female breast: Secondary | ICD-10-CM | POA: Diagnosis not present

## 2021-12-13 DIAGNOSIS — C3431 Malignant neoplasm of lower lobe, right bronchus or lung: Secondary | ICD-10-CM | POA: Diagnosis not present

## 2021-12-13 DIAGNOSIS — Z5112 Encounter for antineoplastic immunotherapy: Secondary | ICD-10-CM | POA: Diagnosis not present

## 2021-12-13 DIAGNOSIS — C50411 Malignant neoplasm of upper-outer quadrant of right female breast: Secondary | ICD-10-CM

## 2021-12-13 DIAGNOSIS — C3411 Malignant neoplasm of upper lobe, right bronchus or lung: Secondary | ICD-10-CM | POA: Diagnosis not present

## 2021-12-13 DIAGNOSIS — C786 Secondary malignant neoplasm of retroperitoneum and peritoneum: Secondary | ICD-10-CM | POA: Diagnosis not present

## 2021-12-13 DIAGNOSIS — C7931 Secondary malignant neoplasm of brain: Secondary | ICD-10-CM | POA: Diagnosis not present

## 2021-12-13 MED ORDER — MISC. DEVICES MISC: 0 refills | Status: DC

## 2021-12-13 NOTE — Progress Notes (Signed)
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 She is currently receiving anastrozole. She is pending start of immunotherapy with Keytruda on 1/26.  Met with patient in clinic. She reports having significant pain in hip and back this afternoon. Patient is ready to leave office. Patient reports appetite is poor. Patient is taking appetite stimulant (Megace). She is drinking 2 Ensure Plus daily. Patient endorses dysphagia, states her "last real meal was 09/28" Patient reports difficulty with regular textures for "the longest time but managed." This has worsened in the last few months, now limited to foods that "slide down easy" recalling jello, oatmeal, pudding, mashed potatoes, soups, hard boiled eggs.   Medications: reviewed   Labs: 1/16 - K 3.4, Glucose 111  Anthropometrics: Weight 122 lb 14.4 oz today   1/19 - 125 lb 6.4 oz 1/16 - 122 lb 3.2 oz  12/29 - 130 lb  12/5 - 137 lb 8 oz    NUTRITION DIAGNOSIS: Unintentional weight loss ongoing    INTERVENTION:  Discussed soft moist high protein foods - handout with ideas provided Discussed ways to alter texture of foods for ease of intake Educated on ways to add calories and protein to foods currently tolerating (using milk to prepare oatmeal, adding cheese, using Ensure to prepare shakes) - shake recipes provided Continue drinking Ensure Plus, recommend 4-5 daily - coupons provided  Consider referral to GI/SLP for assessment of progressive dysphagia - message sent to provider Patient has contact information     MONITORING, EVALUATION, GOAL: weight trends, intake   NEXT VISIT: Thursday February 16 during infusion

## 2021-12-13 NOTE — Patient Instructions (Addendum)
[place] at [facility] Discharge Instructions   You were seen and examined today by Dr. Delton Coombes.  He reviewed the results of your Caris testing.  It shows you would benefit from an immunotherapy drug called Keytruda.  It would be given in the clinic once every 3 weeks. We would give you this drug on this schedule as long as it continues to help control this cancer. This cancer is not curable but treatable with this drug.    We will repeat scans after several treatments to assess treatment response.  We will also repeat an MRI of the brain to assess the spot noted on prior exam.   Return as scheduled for lab work, office visit, and treatment.    Thank you for choosing [place] at [facility] to provide your oncology and hematology care.  To afford each patient quality time with our provider, please arrive at least 15 minutes before your scheduled appointment time.   If you have a lab appointment with the [facility] please come in thru the Main Entrance and check in at the main information desk.  You need to re-schedule your appointment should you arrive 10 or more minutes late.  We strive to give you quality time with our providers, and arriving late affects you and other patients whose appointments are after yours.  Also, if you no show three or more times for appointments you may be dismissed from the clinic at the providers discretion.     Again, thank you for choosing [facility].  Our hope is that these requests will decrease the amount of time that you wait before being seen by our physicians.       _____________________________________________________________  Should you have questions after your visit to [facility], please contact our office at [phone] and follow the prompts.  Our office hours are 8:00 a.m. and 4:30 p.m. Monday - Friday.  Please note that voicemails left after 4:00 p.m. may not be  returned until the following business day.  We are closed weekends and major holidays.  You do have access to a nurse 24-7, just call the main number to the clinic [phone] and do not press any options, hold on the line and a nurse will answer the phone.    For prescription refill requests, have your pharmacy contact our office and allow 72 hours.    Due to Covid, you will need to wear a mask upon entering the hospital. If you do not have a mask, a mask will be given to you at the Main Entrance upon arrival. For doctor visits, patients may have 1 support person age 36 or older with them. For treatment visits, patients can not have anyone with them due to social distancing guidelines and our immunocompromised population.

## 2021-12-13 NOTE — Progress Notes (Signed)
START ON PATHWAY REGIMEN - Non-Small Cell Lung     A cycle is every 21 days:     Pembrolizumab   **Always confirm dose/schedule in your pharmacy ordering system**  Patient Characteristics: Stage IV Metastatic, Nonsquamous, Molecular Analysis Completed, Molecular Alteration Present and Targeted Therapy Exhausted OR EGFR Exon 20+ or KRAS G12C+ or HER2+ Present and No Prior Chemo/Immunotherapy OR No Alteration Present, Initial  Chemotherapy/Immunotherapy, PS = 2, No Alteration Present, No Alteration Present, PD-L1 Expression Positive ? 50% (TPS) Therapeutic Status: Stage IV Metastatic Histology: Nonsquamous Cell Broad Molecular Profiling Status: Molecular Analysis Completed Molecular Analysis Results: No Alteration Present ECOG Performance Status: 2 Chemotherapy/Immunotherapy Line of Therapy: Initial Chemotherapy/Immunotherapy EGFR Exons 18-21 Mutation Testing Status: Completed and Negative ALK Fusion/Rearrangement Testing Status: Completed and Negative BRAF V600 Mutation Testing Status: Completed and Negative KRAS G12C Mutation Testing Status: Completed and Negative MET Exon 14 Mutation Testing Status: Completed and Negative RET Fusion/Rearrangement Testing Status: Completed and Negative HER2 Mutation Testing Status: Completed and Negative NTRK Fusion/Rearrangement Testing Status: Completed and Negative ROS1 Fusion/Rearrangement Testing Status: Completed and Negative PD-L1 Expression Status: PD-L1 Positive ? 50% (TPS) Intent of Therapy: Non-Curative / Palliative Intent, Discussed with Patient 

## 2021-12-13 NOTE — Progress Notes (Signed)
Patient is taking Anastrozole as prescribed.  She has not missed any doses and reports no side effects at this time.

## 2021-12-13 NOTE — Progress Notes (Signed)
Elizabeth Wu, [place] [address]   CLINIC:  Medical Oncology/Hematology  PCP:  [name], DO [address] 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, a 71 y.o. female, returns for routine follow-up of her bilateral breast cancer. Dacy was last seen on 12/02/2021.   Today she reports feeling fair. Her appetite is intermittently poor, and she is unable to chew. She is drinking 2-3 Ensure daily. She has lost 2.5 lbs since 01/19. She reports fatigue, and she denies nausea. She denies history of autoimmune issues. She reports pain in her right lower back and right hip. She reports occasional tremors in her right hand. She is taking Megace BID which she reports helps slightly. She is currently taking hydrocodone which reports helps relieve her pain better than oxycodone.   REVIEW OF SYSTEMS:  Review of Systems  Constitutional:  Positive for appetite change and fatigue.  HENT:   Positive for trouble swallowing.   Respiratory:  Positive for shortness of breath.   Cardiovascular:  Positive for  palpitations.  Gastrointestinal:  Negative for nausea.  Musculoskeletal:  Positive for arthralgias (R leg 8/10) and back pain.  Neurological:  Positive for headaches.  Psychiatric/Behavioral:  Positive for sleep disturbance.   All other systems reviewed and are negative.  PAST MEDICAL/SURGICAL HISTORY:  Past Medical History:  Diagnosis Date   Allergy    Cancer ([facility])    Breast   Complication of anesthesia    DJD (degenerative joint disease)    Hyperlipidemia    Hypertension    Osteopenia    PONV (postoperative nausea and vomiting)    Past Surgical History:  Procedure Laterality Date   APPENDECTOMY     BRONCHIAL BIOPSY  11/11/2021   Procedure: BRONCHIAL BIOPSIES;  Surgeon: Collene Gobble, MD;  Location: [facility];  Service: Pulmonary;;   BRONCHIAL BRUSHINGS  11/11/2021   Procedure: BRONCHIAL BRUSHINGS;  Surgeon: Collene Gobble, MD;  Location: [facility];  Service: Pulmonary;;   BRONCHIAL NEEDLE ASPIRATION BIOPSY  11/11/2021   Procedure: BRONCHIAL NEEDLE ASPIRATION BIOPSIES;  Surgeon: Collene Gobble, MD;  Location: [facility];  Service: Pulmonary;;   FIDUCIAL MARKER PLACEMENT  11/11/2021   Procedure: FIDUCIAL MARKER PLACEMENT;  Surgeon: Collene Gobble, MD;  Location: [facility];  Service: Pulmonary;;   HERNIA REPAIR     PORTACATH PLACEMENT Left 12/10/2021   Procedure: INSERTION PORT-A-CATH;  Surgeon: Virl Cagey, MD;  Location: [facility];  Service: General;  Laterality: Left;   TONSILLECTOMY     TONSILLECTOMY      SOCIAL HISTORY:  Social History   Socioeconomic  History   Marital status: Single    Spouse name: Not on file   Number of children: Not on file   Years of education: Not on file   Highest education level: 12th grade  Occupational History   Occupation: [employer]  Tobacco Use   Smoking status: Every Day    Packs/day: 0.30    Years: 55.00    Pack years: 16.50    Types: Cigarettes   Smokeless tobacco: Never  Vaping Use   Vaping Use:  Never used  Substance and Sexual Activity   Alcohol use: No   Drug use: No   Sexual activity: Not on file  Other Topics Concern   Not on file  Social History Narrative   Not on file   Social Determinants of Health   Financial Resource Strain: Not on file  Food Insecurity: Not on file  Transportation Needs: Not on file  Physical Activity: Not on file  Stress: Not on file  Social Connections: Not on file  Intimate Partner Violence: Not on file    FAMILY HISTORY:  Family History  Problem Relation Age of Onset   Alzheimer's disease Mother    Cancer Father        stomach cancer   Diabetes Sister    Heart disease Brother        MI    CURRENT MEDICATIONS:  Current Outpatient Medications  Medication Sig Dispense Refill   anastrozole (ARIMIDEX) 1 MG tablet Take 1 tablet (1 mg total) by mouth daily. 30 tablet 6   Cholecalciferol (VITAMIN D) 50 MCG (2000 UT) tablet Take 2,000 Units by mouth daily.     cyanocobalamin (,VITAMIN B-12,) 1000 MCG/ML injection Inject 1,000 mcg into the muscle every 30 (thirty) days.     docusate sodium (COLACE) 100 MG capsule Take 100 mg by mouth 2 (two) times daily.     Ensure (ENSURE) Take 237 mLs by mouth 2 (two) times daily between meals.     fluconazole (DIFLUCAN) 100 MG tablet Take 1 tablet (100 mg total) by mouth daily. Take two tabs on day one followed by one tablet daily. 8 tablet 0   folic acid (FOLVITE) 1 MG tablet Take 1 mg by mouth daily.     HYDROcodone-acetaminophen (NORCO) 10-325 MG tablet Take 1 tablet by mouth every 6 (six) hours as needed. 120 tablet 0   ibuprofen (ADVIL) 200 MG tablet Take 400 mg by mouth every 6 (six) hours as needed for moderate pain.     megestrol (MEGACE) 400 MG/10ML suspension Take 10 mLs (400 mg total) by mouth 2 (two) times daily. 480 mL 3   metoprolol tartrate (LOPRESSOR) 50 MG tablet Take 1 tablet (50 mg total) by mouth 2 (two) times daily. 180 tablet 3   Omega-3 Fatty Acids (FISH OIL) 1000 MG CAPS Take 1,000  mg by mouth daily.     simvastatin (ZOCOR) 20 MG tablet Take 1 tablet (20 mg total) by mouth at bedtime. 90 tablet 3   Oxycodone HCl 10 MG TABS Take 1 tablet (10 mg total) by mouth every 6 (six) hours as needed. (Patient not taking: Reported on 12/13/2021) 90 tablet 0   prochlorperazine (COMPAZINE) 10 MG tablet Take 1 tablet (10 mg total) by mouth every 6 (six) hours as needed for nausea or vomiting. (Patient not taking: Reported on 12/13/2021) 30 tablet 0   No current facility-administered medications for this visit.    ALLERGIES:  No Known Allergies  PHYSICAL EXAM:  Performance status (ECOG):  1 - Symptomatic but completely ambulatory  Vitals:   12/13/21 1252  BP: 102/67  Pulse: (!) 103  Resp: 18  Temp: (!) 96.7 F (35.9 C)  SpO2: 95%   Wt Readings from Last 3 Encounters:  12/13/21 122 lb 14.4 oz (55.7 kg)  12/09/21 125 lb 6.4 oz (56.9 kg)  12/07/21 122 lb 3.2 oz (55.4 kg)   Physical Exam Vitals reviewed.  Constitutional:      Appearance: Normal appearance.     Comments: In wheelchair  Cardiovascular:     Rate and Rhythm: Normal rate and regular rhythm.     Pulses: Normal pulses.     Heart sounds: Normal heart sounds.  Pulmonary:     Effort: Pulmonary effort is normal.     Breath sounds: Normal breath sounds.  Neurological:     General: No focal deficit present.     Mental Status: She is alert and oriented to person, place, and time.  Psychiatric:        Mood and Affect: Mood normal.        Behavior: Behavior normal.     LABORATORY DATA:  I have reviewed the labs as listed.  CBC Latest Ref Rng & Units 12/06/2021 12/02/2021 11/09/2021  WBC 4.0 - 10.5 K/uL 12.5(H) 9.3 8.6  Hemoglobin 12.0 - 15.0 g/dL 11.6(L) 13.1 12.6  Hematocrit 36.0 - 46.0 % 36.5 38.6 37.1  Platelets 150 - 400 K/uL 423(H) 447(H) 392   CMP Latest Ref Rng & Units 12/06/2021 12/02/2021 07/21/2021  Glucose 70 - 99 mg/dL 111(H) 149(H) 123(H)  BUN 8 - 23 mg/dL 19 32(H) 10  Creatinine 0.44 - 1.00 mg/dL  0.64 0.86 0.82  Sodium 135 - 145 mmol/L 136 138 142  Potassium 3.5 - 5.1 mmol/L 3.4(L) 3.9 3.2(L)  Chloride 98 - 111 mmol/L 106 105 101  CO2 22 - 32 mmol/L 21(L) 24 25  Calcium 8.9 - 10.3 mg/dL 8.7(L) 9.2 9.9  Total Protein 6.5 - 8.1 g/dL 6.7 6.9 -  Total Bilirubin 0.3 - 1.2 mg/dL 0.7 0.7 -  Alkaline Phos 38 - 126 U/L 68 72 -  AST 15 - 41 U/L 22 27 -  ALT 0 - 44 U/L 38 31 -    DIAGNOSTIC IMAGING:  I have independently reviewed the scans and discussed with the patient. CT Angio Chest PE W and/or Wo Contrast  Result Date: 12/06/2021 CLINICAL DATA:  Pulmonary embolism suspected. High probability due to tachycardia and left-sided chest pain that began this morning EXAM: CT ANGIOGRAPHY CHEST WITH CONTRAST TECHNIQUE: Multidetector CT imaging of the chest was performed using the standard protocol during bolus administration of intravenous contrast. Multiplanar CT image reconstructions and MIPs were obtained to evaluate the vascular anatomy. RADIATION DOSE REDUCTION: This exam was performed according to the departmental dose-optimization program which includes automated exposure control, adjustment of the mA and/or kV according to patient size and/or use of iterative reconstruction technique. CONTRAST:  14m OMNIPAQUE IOHEXOL 350 MG/ML SOLN COMPARISON:  Noncontrast chest CT 11/08/2021 FINDINGS: Cardiovascular: Satisfactory opacification of the pulmonary arteries to the segmental level. No evidence of pulmonary embolism. Normal heart size. No pericardial effusion. Atheromatous calcification of the aorta and coronaries. Irregular luminal plaque at the descending aorta. Mediastinum/Nodes: Negative for adenopathy Lungs/Pleura: Diffuse micro nodularity and fissure lobulation. Partially cavitary mass in the subpleural right lower lobe measuring 3.3 cm. No detected progression of these nodules but there is superimposed interlobular septal thickening. No pleural effusion. No focal consolidation. Upper Abdomen: No  acute finding Musculoskeletal:  No acute or aggressive finding. Other: 2 left breast masses. The lower and more medial now measures 18 mm as compared to 15 mm. Review of the MIP images confirms the above findings. IMPRESSION: 1. Negative for pulmonary embolism. 2. Mild interstitial edema superimposed on pulmonary metastatic disease with lymphangitic component. 3. Two left breast masses, 1 measuring mildly larger than on CT last month. Electronically Signed   By: Jorje Guild M.D.   On: 12/06/2021 11:35   MR Brain W Wo Contrast  Result Date: 11/29/2021 CLINICAL DATA:  Provided history: Malignant neoplasm of unspecified part of unspecified bronchus or lung. Non-small cell lung cancer, staging. EXAM: MRI HEAD WITHOUT AND WITH CONTRAST TECHNIQUE: Multiplanar, multiecho pulse sequences of the brain and surrounding structures were obtained without and with intravenous contrast. CONTRAST:  44mL GADAVIST GADOBUTROL 1 MMOL/ML IV SOLN COMPARISON:  PET-CT 10/21/2021. FINDINGS: Mild-to-moderate intermittent motion degradation. Brain: Mild generalized cerebral and cerebellar atrophy. 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 Elsewhere, there is mild multifocal T2 FLAIR hyperintense signal abnormality within the cerebral white matter and pons, nonspecific but compatible with chronic small vessel ischemic disease. Small chronic lacunar infarcts within the bilateral basal ganglia. 4 mm nodular enhancing focus along the left postcentral gyrus (series 15, image 42) (series 16, image 11). There is no acute infarct. No chronic intracranial blood products. No extra-axial fluid collection. No midline shift. Vascular: Maintained flow voids within the proximal large arterial vessels. Skull and upper cervical spine: 10 mm enhancing focus within the left parietal calvarium (series 15, image 51). Incompletely assessed cervical spondylosis. Sinuses/Orbits: Visualized orbits  show no acute finding. No significant paranasal sinus disease. Other: Trace fluid within the bilateral mastoid air cells. Impression #2 and #3 will be called to the ordering clinician or representative by the Radiologist Assistant, and communication documented in the PACS or [employer]. IMPRESSION: 1. Intermittently motion degraded exam. 2. 4 mm nodular enhancing focus along the left postcentral gyrus, highly suspicious for a solitary intracranial metastasis. 3. 10 mm enhancing focus within the left parietal calvarium. Although nonspecific, osseous metastatic disease cannot be excluded. Attention recommended on follow-up. 4. 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 5. Chronic small vessel ischemic disease, as described. 6. Mild generalized parenchymal atrophy. 7. Trace fluid within the bilateral mastoid air cells. Electronically Signed   By: Kellie Simmering D.O.   On: 11/29/2021 17:33   DG Chest Port 1 View  Result Date: 12/10/2021 CLINICAL DATA:  71 year old female left Port-A-Cath placement. Metastatic breast cancer. EXAM: PORTABLE CHEST 1 VIEW COMPARISON:  Chest CTA 12/06/2021 and earlier. FINDINGS: Portable AP semi upright view at 1004 hours. Left subclavian approach power port placed. No pneumothorax or adverse features identified. Stable cardiac size and mediastinal contours. Extensive bilateral reticulonodular abnormal pulmonary opacity persists. No pleural effusion or consolidation. Negative visible bowel gas. Stable visualized osseous structures. IMPRESSION: 1. Left chest Port-A-Cath with no adverse features. 2. 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 Electronically Signed   By: Genevie Ann M.D.   On: 12/10/2021 10:13   DG Chest Port 1 View  Result Date: 12/06/2021 CLINICAL DATA:  Shortness of breath.  Tachycardia. EXAM: PORTABLE CHEST 1 VIEW COMPARISON:  11/11/2021 and chest CT  08/02/2021 FINDINGS: Prominent interstitial lung markings appear to be chronic. In addition, there are tiny nodular densities in lungs and similar to the prior chest CT. No significant airspace disease or lung consolidation. Heart size  is within normal limits and stable. Trachea is midline. Negative for a pneumothorax. No overt pulmonary edema. IMPRESSION: Chronic lung changes without acute findings. Electronically Signed   By: Markus Daft M.D.   On: 12/06/2021 10:02   DG C-Arm 1-60 Min-No Report  Result Date: 12/10/2021 Fluoroscopy was utilized by the requesting physician.  No radiographic interpretation.     ASSESSMENT:  Bilateral breast cancer: - Screening mammogram on 08/24/2021 with suspicious 0.9 cm mass in the left breast at 10 o'clock position, 1.3 cm mass at the 12 o'clock position.  Suspicious 0.7 cm mass in the right breast at 10 o'clock position. - Biopsy of the left breast 12:00 and 10 o'clock position consistent with invasive ductal carcinoma, grade 3.  Tumor cells are negative for HER2.  ER is 10% positive with weak staining.  PR is 5% positive with moderate staining.  Ki-67 is 15%.  Tissue for this case was left in formalin for 72 hours, therefore repeat prognostic panel was recommended on excision specimen. - Biopsy of the right breast 10 o'clock position shows invasive lobular carcinoma, ER 60% positive, PR 80% positive, Ki-67 2%, HER2 2+.  HER2 FISH pending. - PET scan on 10/21/2021 with a right lower lobe lung spiculated mass measuring 2.3 x 3.4 cm with SUV 8.1.  Hypermetabolic right middle lobe lung nodule measuring 9 mm with SUV 2.7.  1.5 x 2.1 cm left breast upper outer nodule SUV 2.8.  12 mm nodule in the supra-areolar medial left breast SUV 3.1.  Subcutaneous soft tissue nodule overlying the posterior lower left ribs measuring 1.4 cm with SUV 2.8.  10 mm left omental nodule.  Soft tissue nodule in the left pericolic mesentery measures 7 mm with SUV 1.8.  Soft tissue nodule posterior  to the left psoas muscle measures 9 mm with SUV 2.1. - MRI of the lumbar spine on 09/14/2021 with 1 cm foci of low T1 and high T2 signal within L5 and S1 vertebral bodies, nonspecific, could be marrow space metastatic disease. - 25 pound weight loss since the beginning of this year due to decreased appetite and eating. - Lower back and right hip pain since February/March of this year on and off. - Soft tissue nodule on the back biopsy on 11/09/2021 shows metastatic poorly differentiated carcinoma, CK7 and TTF-1 positive, p40 negative.  Negative for CK20, GATA3, SOX10. - Bronchoscopy and biopsy of the right lower lobe and right upper lobe consistent with adenocarcinoma, TTF-1 and Napsin patchy positive, negative for p40. - NGS: PD-L1 TPS 75% (22 C3).  Nontargetable ATM and K-ras G12V mutation present.    Social/family history: - She lives at home with her friend.  She is retired and worked in Architect, as a Artist, and Corporate investment banker.  She is current active smoker, half to 1 pack/day for 50 to 60 years. - Father had stomach cancer.  Brother had lung cancer.   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 - She has TPS 75% indicating that she will get good response from single agent immunotherapy. - We talked about pembrolizumab and its side effects in detail. - We also talked about comfort care.  Patient would like to undergo active treatment.  We will schedule her for Keytruda infusion.  She will be evaluated in our symptom management clinic 1 week after [employer].  I will see her back in 3 weeks prior to her cycle 2.  2.  Bilateral breast cancer: - We have not offered definitive treatment given her metastatic lung cancer. - Continue anastrozole daily.  She is tolerating it well.  3.  Low back pain and right hip pain: -She is continuing to take hydrocodone 10/325 mg daily.  She took oxycodone 3 times and  said that it did not help.  4.  Weight loss/loss of appetite: - Continue Megace 400 mg twice daily. - She is drinking 2 cans of Ensure per day and eating 2 small meals per day. - Recommend increasing Ensure Plus to 5 cans/day.  5.  Constipation: - Continue Senokot daily.  6.  Brain metastasis: - MRI of the brain showed 4 mm nodular enhancing focus along the left posterior central gyrus. - Plan to repeat MRI brain in 3 months.    Orders placed this encounter:  No orders of the defined types were placed in this encounter.    Derek Jack, MD [facility] [phone]   I, Thana Ates, am acting as a scribe for Dr. Derek Jack.  I, Derek Jack MD, have reviewed the above documentation for accuracy and completeness, and I agree with the above.

## 2021-12-14 ENCOUNTER — Other Ambulatory Visit (HOSPITAL_COMMUNITY): Payer: Self-pay | Admitting: *Deleted

## 2021-12-14 DIAGNOSIS — C3490 Malignant neoplasm of unspecified part of unspecified bronchus or lung: Secondary | ICD-10-CM

## 2021-12-14 DIAGNOSIS — C50411 Malignant neoplasm of upper-outer quadrant of right female breast: Secondary | ICD-10-CM

## 2021-12-15 ENCOUNTER — Other Ambulatory Visit (HOSPITAL_COMMUNITY): Payer: Self-pay | Admitting: *Deleted

## 2021-12-15 ENCOUNTER — Ambulatory Visit: Payer: Medicare HMO | Admitting: Emergency Medicine

## 2021-12-15 DIAGNOSIS — R1310 Dysphagia, unspecified: Secondary | ICD-10-CM

## 2021-12-15 NOTE — Patient Instructions (Signed)
[facility] [place] Chemotherapy Teaching   You are diagnosed with Stage IV lung cancer.  We will treat you in the clinic every 3 weeks with an immunotherapy drug called Keytruda.  The intent of treatment is to shrink and control this cancer, prevent it from spreading further, and to alleviate any symptoms related to this disease.  You will see the doctor regularly throughout treatment.  We will obtain blood work from you prior to every treatment and monitor your results to make sure it is safe to give your treatment. The doctor monitors your response to treatment by the way you are feeling, your blood work, and by obtaining scans periodically.  There will be wait times while you are here for treatment.  It will take about 30 minutes to 1 hour for your lab work to result.  Then there will be wait times while pharmacy mixes your medications.    Pembrolizumab Beryle Flock)  About This Drug Pembrolizumab is used to treat cancer. It is given in the vein (IV).  This drug will take 30 minutes to infuse.  Possible Side Effects  Tiredness  Fever  Nausea  Decreased appetite (decreased hunger)  Loose bowel movements (diarrhea)  Constipation (not able to move bowels)  Trouble breathing  Rash  Itching  Muscle and bone pain  Cough  Note: Each of the side effects above was reported in 20% or greater of patients treated with pembrolizumab. Not all possible side effects are included above.  Warnings and Precautions   This drug works with your immune system and can cause inflammation in any of your organs and tissues and can change how they work. This may put you at risk for developing serious medical problems which can very rarely be fatal.   Colitis (swelling or inflammation in the colon) - symptoms are loose bowel movements (diarrhea) stomach cramping, and sometimes blood in the stool   Changes in liver function. Your liver function will be checked as needed.   Changes in kidney function,  which can very rarely be fatal. Your kidney function will be checked as needed.   Inflammation (swelling) of the lungs which can very rarely be fatal - you may have a dry cough or trouble breathing.   This drug may affect some of your hormone glands (especially the thyroid, adrenals, pituitary and pancreas). Your hormone levels will be checked as needed.   Blood sugar levels may change and you may develop diabetes. If you already have diabetes, changes may need to be made to your diabetes medication.   Severe allergic skin reaction, which can very rarely be fatal. You may develop blisters on your skin that are filled with fluid or a severe red rash all over your body that may be painful.   Increased risk of organ rejection in patients who have received donor organs   Increased risk of complications in patients who will undergo a stem cell transplant after receiving pembrolizumab.   While you are getting this drug in your vein (IV), you may have a reaction to the drug. Your nurse will check you closely for these signs: fever or shaking chills, flushing, facial swelling, feeling dizzy, headache, trouble breathing, rash, itching, chest tightness, or chest pain. These reactions may occur after your infusion. If this happens, call 911 for emergency care.  Important Information This drug may be present in the saliva, tears, sweat, urine, stool, vomit, semen, and vaginal secretions. Talk to your doctor and/or your nurse about the necessary precautions to  take during this time.  Treating Side Effects   Ask your doctor or nurse about medicines that are available to help stop or lessen constipation, diarrhea and/or nausea.   Drink plenty of fluids (a minimum of eight glasses per day is recommended).   If you are not able to move your bowels, check with your doctor or nurse before you use any enemas, laxatives, or suppositories   To help with nausea and vomiting, eat small, frequent meals instead of  three large meals a day. Choose foods and drinks that are at room temperature. Ask your nurse or doctor about other helpful tips and medicine that is available to help or stop lessen these symptoms.   If you get diarrhea, eat low-fiber foods that are high in protein and calories and avoid foods that can irritate your digestive tracts or lead to cramping. Ask your nurse or doctor about medicine that can lessen or stop your diarrhea.   Manage tiredness by pacing your activities for the day. Be sure to include periods of rest between energy-draining activities   Keeping your pain under control is important to your wellbeing. Please tell your doctor or nurse if you are experiencing pain.   If you have diabetes, keep good control of your blood sugar level. Tell your nurse or your doctor if your glucose levels are higher or lower than normal   If you get a rash do not put anything on it unless your doctor or nurse says you may. Keep the area around the rash clean and dry. Ask your doctor for medicine if your rash bothers you.   Infusion reactions may happen for 24 hours after your infusion. If this happens, call 911 for emergency care.  Food and Drug Interactions   There are no known interactions of pembrolizumab with food.   There are no known interactions of pembrolizumab with other medications.   Tell your doctor and pharmacist about all the medicines and dietary supplements (vitamins, minerals, herbs and others) that you are taking at this time. The safety and use of dietary supplements and alternative agents are often not known. Using these might affect your cancer or interfere with your treatment. Until more is known, you should not use dietary supplements or alternative agents without your cancer doctor's help.   When to Call the Doctor Call your doctor or nurse if you have any of the following symptoms and/or any new or unusual symptoms:   Fever of 100.4 F (38 C) or higher   Chills    Wheezing or trouble breathing   Rash or itching   Feeling dizzy or lightheaded   Loose bowel movements (diarrhea) more than 4 times a day or diarrhea with weakness or lightheadedness, or diarrhea that is not controlled by medications   Nausea that stops you from eating or drinking, and/or that is not relieved by prescribed medicines   Lasting loss of appetite or rapid weight loss of five pounds in a week   Fatigue that interferes with your daily activities   No bowel movement for 3 days or you feel uncomfortable   Extreme weakness that interferes with normal activities   Bad abdominal pain, especially in upper right area   Decreased urine   Unusual thirst or passing urine often   Rash that is not relieved by prescribed medicines   Flu-like symptoms: fever, headache, muscle and joint aches, and fatigue (low energy, feeling weak)   Signs of liver problems: dark urine, pale bowel movements, bad  stomach pain, feeling very tired and weak, unusual itching, or yellowing of the eyes or skin   Signs of infusion reactions such as fever or shaking chills, flushing, facial swelling, feeling dizzy, headache, trouble breathing, rash, itching, chest tightness, or chest pain.   Reproduction Warnings   Pregnancy warning: This drug may have harmful effects on the unborn baby. Women of childbearing potential should use effective methods of birth control during your cancer treatment and for at least 4 months after treatment. Let your doctor know right away if you think you may be pregnant   Breast feeding warning: It is not known if this drug passes into breast milk. It is recommended that women do not breastfeed during treatment and for 4 months after treatment.   Fertility warning: Human fertility studies have not been done with this drug. Talk with your doctor or nurse if you plan to have children.   SELF CARE ACTIVITIES WHILE ON CHEMOTHERAPY/IMMUNOTHERAPY:  Hydration Increase your fluid  intake 48 hours prior to treatment and drink at least 8 to 12 cups (64 ounces) of water/decaffeinated beverages per day after treatment. You can still have your cup of coffee or soda but these beverages do not count as part of your 8 to 12 cups that you need to drink daily. No alcohol intake.  Medications Continue taking your normal prescription medication as prescribed.  If you start any new herbal or new supplements please let us know first to make sure it is safe.  Mouth Care Have teeth cleaned professionally before starting treatment. Keep dentures and partial plates clean. Use soft toothbrush and do not use mouthwashes that contain alcohol. Biotene is a good mouthwash that is available at most pharmacies or may be ordered by calling [phone]. Use warm salt water gargles (1 teaspoon salt per 1 quart warm water) before and after meals and at bedtime. Or you may rinse with 2 tablespoons of three-percent hydrogen peroxide mixed in eight ounces of water. If you are still having problems with your mouth or sores in your mouth please call the clinic. If you need dental work, please let the doctor know before you go for your appointment so that we can coordinate the best possible time for you in regards to your chemo regimen. You need to also let your dentist know that you are actively taking chemo. We may need to do labs prior to your dental appointment.  Skin Care Always use sunscreen that has not expired and with SPF (Sun Protection Factor) of 50 or higher. Wear hats to protect your head from the sun. Remember to use sunscreen on your hands, ears, face, & feet.  Use good moisturizing lotions such as udder cream, eucerin, or even Vaseline. Some chemotherapies can cause dry skin, color changes in your skin and nails.    Avoid long, hot showers or baths. Use gentle, fragrance-free soaps and laundry detergent. Use moisturizers, preferably creams or ointments rather than lotions because the thicker  consistency is better at preventing skin dehydration. Apply the cream or ointment within 15 minutes of showering. Reapply moisturizer at night, and moisturize your hands every time after you wash them.   Infection Prevention Please wash your hands for at least 30 seconds using warm soapy water. Handwashing is the #1 way to prevent the spread of germs. Stay away from sick people or people who are getting over a cold. If you develop respiratory systems such as green/yellow mucus production or productive cough or persistent cough let us  know and we will see if you need an antibiotic. It is a good idea to keep a pair of gloves on when going into grocery stores/[employer] to decrease your risk of coming into contact with germs on the carts, etc. Carry alcohol hand gel with you at all times and use it frequently if out in public. If your temperature reaches 100.5 or higher please call the clinic and let us know.  If it is after hours or on the weekend please go to the ER if your temperature is over 100.4.  Please have your own personal thermometer at home to use.    Sex and bodily fluids If you are going to have sex, a condom must be used to protect the person that isn't taking immunotherapy. For a few days after treatment, immunotherapy can be excreted through your bodily fluids.  When using the toilet please close the lid and flush the toilet twice.  Do this for a few day after you have had immunotherapy.   Contraception It is not known for sure whether or not immunotherapy drugs can be passed on through semen or secretions from the vagina. Because of this some doctors advise people to use a barrier method if you have sex during treatment. This applies to vaginal, anal or oral sex.  Generally, doctors advise a barrier method only for the time you are actually having the treatment and for about a week after your treatment.  Advice like this can be worrying, but this does not mean that you have to avoid being  intimate with your partner. You can still have close contact with your partner and continue to enjoy sex.  Animals If you have cats or birds we just ask that you not change the litter or change the cage.  Please have someone else do this for you while you are on immunotherapy.   Food Safety During and After Cancer Treatment Food safety is important for people both during and after cancer treatment. Cancer and cancer treatments, such as chemotherapy, radiation therapy, and stem cell/bone marrow transplantation, often weaken the immune system. This makes it harder for your body to protect itself from foodborne illness, also called food poisoning. Foodborne illness is caused by eating food that contains harmful bacteria, parasites, or viruses.  Foods to avoid Some foods have a higher risk of becoming tainted with bacteria. These include: Unwashed fresh fruit and vegetables, especially leafy vegetables that can hide dirt and other contaminants Raw sprouts, such as alfalfa sprouts Raw or undercooked beef, especially ground beef, or other raw or undercooked meat and poultry Fatty, fried, or spicy foods immediately before or after treatment.  These can sit heavy on your stomach and make you feel nauseous. Raw or undercooked shellfish, such as oysters. Sushi and sashimi, which often contain raw fish.  Unpasteurized beverages, such as unpasteurized fruit juices, raw milk, raw yogurt, or cider Undercooked eggs, such as soft boiled, over easy, and poached; raw, unpasteurized eggs; or foods made with raw egg, such as homemade raw cookie dough and homemade mayonnaise  Simple steps for food safety  Shop smart. Do not buy food stored or displayed in an unclean area. Do not buy bruised or damaged fruits or vegetables. Do not buy cans that have cracks, dents, or bulges. Pick up foods that can spoil at the end of your shopping trip and store them in a cooler on the way home.  Prepare and clean up foods  carefully. Rinse all fresh fruits and vegetables  under running water, and dry them with a clean towel or paper towel. Clean the top of cans before opening them. After preparing food, wash your hands for 20 seconds with hot water and soap. Pay special attention to areas between fingers and under nails. Clean your utensils and dishes with hot water and soap. Disinfect your kitchen and cutting boards using 1 teaspoon of liquid, unscented bleach mixed into 1 quart of water.    Dispose of old food. Eat canned and packaged food before its expiration date (the use by or best before date). Consume refrigerated leftovers within 3 to 4 days. After that time, throw out the food. Even if the food does not smell or look spoiled, it still may be unsafe. Some bacteria, such as Listeria, can grow even on foods stored in the refrigerator if they are kept for too long.  Take precautions when eating out. At restaurants, avoid buffets and salad bars where food sits out for a long time and comes in contact with many people. Food can become contaminated when someone with a virus, often a norovirus, or another bug handles it. Put any leftover food in a to-go container yourself, rather than having the server do it. And, refrigerate leftovers as soon as you get home. Choose restaurants that are clean and that are willing to prepare your food as you order it cooked.    SYMPTOMS TO REPORT AS SOON AS POSSIBLE AFTER TREATMENT:  FEVER GREATER THAN 100.4 F  CHILLS WITH OR WITHOUT FEVER  NAUSEA AND VOMITING THAT IS NOT CONTROLLED WITH YOUR NAUSEA MEDICATION  UNUSUAL SHORTNESS OF BREATH  UNUSUAL BRUISING OR BLEEDING  TENDERNESS IN MOUTH AND THROAT WITH OR WITHOUT PRESENCE OF ULCERS  URINARY PROBLEMS  BOWEL PROBLEMS  UNUSUAL RASH     Wear comfortable clothing and clothing appropriate for easy access to any Portacath or PICC line. Let us know if there is anything that we can do to make your therapy  better!   What to do if you need assistance after hours or on the weekends: CALL [phone].  HOLD on the line, do not hang up.  You will hear multiple messages but at the end you will be connected with a nurse triage line.  They will contact the doctor if necessary.  Most of the time they will be able to assist you.  Do not call the hospital operator.    I have been informed and understand all of the instructions given to me and have received a copy. I have been instructed to call the clinic [phone] or my family physician as soon as possible for continued medical care, if indicated. I do not have any more questions at this time but understand that I may call the [facility] or the Patient Navigator at [phone] during office hours should I have questions or need assistance in obtaining follow-up care.

## 2021-12-16 ENCOUNTER — Other Ambulatory Visit (HOSPITAL_COMMUNITY): Payer: Medicare HMO

## 2021-12-16 ENCOUNTER — Encounter (HOSPITAL_COMMUNITY): Payer: Self-pay

## 2021-12-16 ENCOUNTER — Other Ambulatory Visit (HOSPITAL_COMMUNITY): Payer: Self-pay | Admitting: Specialist

## 2021-12-16 ENCOUNTER — Ambulatory Visit (HOSPITAL_COMMUNITY): Payer: Medicare HMO

## 2021-12-16 DIAGNOSIS — R1310 Dysphagia, unspecified: Secondary | ICD-10-CM

## 2021-12-17 ENCOUNTER — Ambulatory Visit (INDEPENDENT_AMBULATORY_CARE_PROVIDER_SITE_OTHER): Payer: Medicare HMO | Admitting: *Deleted

## 2021-12-17 DIAGNOSIS — E538 Deficiency of other specified B group vitamins: Secondary | ICD-10-CM | POA: Diagnosis not present

## 2021-12-17 MED ORDER — CYANOCOBALAMIN 1000 MCG/ML IJ SOLN
1000.0000 ug | Freq: Once | INTRAMUSCULAR | Status: AC
  Administered 2021-12-17: 1000 ug via INTRAMUSCULAR

## 2021-12-17 NOTE — Progress Notes (Signed)
Pharmacist Chemotherapy Monitoring - Initial Assessment    Anticipated start date: 12/21/21   The following has been reviewed per standard work regarding the patient's treatment regimen: The patient's diagnosis, treatment plan and drug doses, and organ/hematologic function Lab orders and baseline tests specific to treatment regimen  The treatment plan start date, drug sequencing, and pre-medications Prior authorization status  Patient's documented medication list, including drug-drug interaction screen and prescriptions for anti-emetics and supportive care specific to the treatment regimen The drug concentrations, fluid compatibility, administration routes, and timing of the medications to be used The patient's access for treatment and lifetime cumulative dose history, if applicable  The patient's medication allergies and previous infusion related reactions, if applicable   Changes made to treatment plan:  treatment plan date  Follow up needed:  N/A   Wynona Neat, [facility], 12/17/2021  9:56 AM

## 2021-12-19 ENCOUNTER — Encounter (HOSPITAL_COMMUNITY): Payer: Self-pay

## 2021-12-20 ENCOUNTER — Other Ambulatory Visit: Payer: Self-pay

## 2021-12-20 ENCOUNTER — Ambulatory Visit (HOSPITAL_COMMUNITY)
Admission: RE | Admit: 2021-12-20 | Discharge: 2021-12-20 | Disposition: A | Discharge status: Home / Self Care | Payer: Medicare HMO | Source: Ambulatory Visit | Attending: Hematology | Admitting: Hematology

## 2021-12-20 ENCOUNTER — Ambulatory Visit (HOSPITAL_COMMUNITY): Payer: Medicare HMO | Attending: Hematology | Admitting: Speech Pathology

## 2021-12-20 ENCOUNTER — Encounter (HOSPITAL_COMMUNITY): Payer: Self-pay | Admitting: Speech Pathology

## 2021-12-20 DIAGNOSIS — R059 Cough, unspecified: Secondary | ICD-10-CM | POA: Diagnosis not present

## 2021-12-20 DIAGNOSIS — R1310 Dysphagia, unspecified: Secondary | ICD-10-CM | POA: Diagnosis not present

## 2021-12-20 DIAGNOSIS — R1312 Dysphagia, oropharyngeal phase: Secondary | ICD-10-CM | POA: Diagnosis not present

## 2021-12-20 IMAGING — RF DG SWALLOWING FUNCTION
13 series · 13 of 24 positions shown · non-contrast
Comparison: None.

CLINICAL DATA: Dysphagia. Cough/GE reflux disease/other secondary
diagnosis

EXAM:
MODIFIED BARIUM SWALLOW
TECHNIQUE: Different consistencies of barium were administered orally to the
patient by the Speech Pathologist. Imaging of the pharynx was
performed in the lateral projection.
FLUOROSCOPY TIME:  Fluoroscopy Time:  1 minutes 6 seconds
Radiation Exposure Index (if provided by the fluoroscopic device):
39.2 mGy
Number of Acquired Spot Images: 0

[Series 1: run · 1 of 41 frames shown]
[frame 7/41]
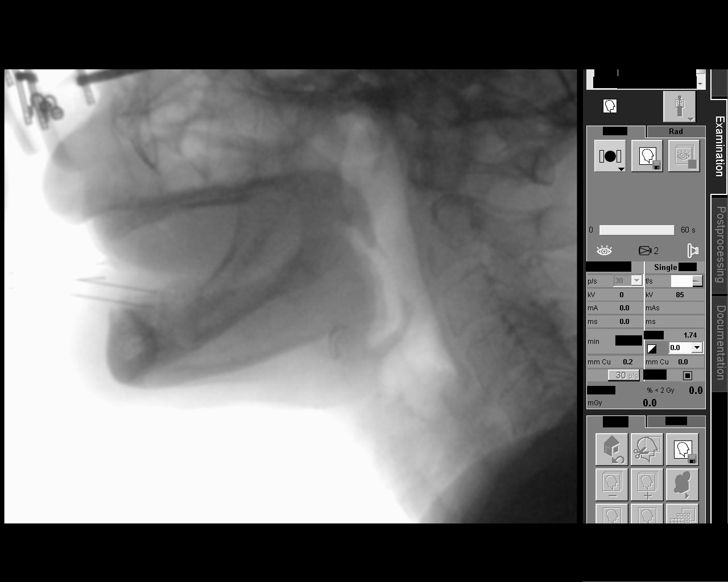

[Series 2: tsp thin 1 · 1 of 148 frames shown]
[frame 23/148]
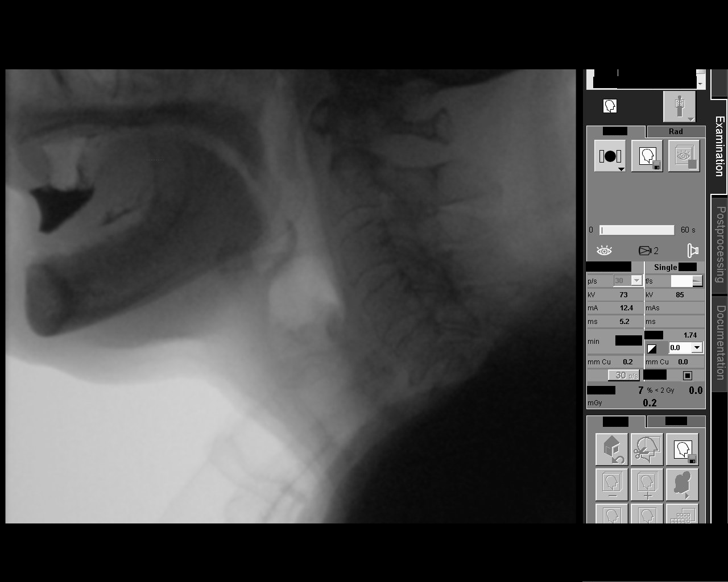

[Series 3: tsp thin 2 · 1 of 147 frames shown]
[frame 74/147]
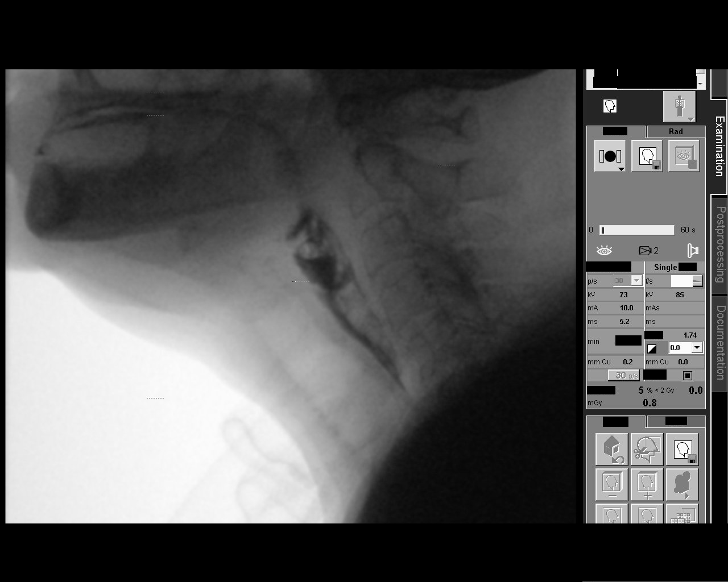

[Series 4: cup thin 1 · 1 of 194 frames shown]
[frame 98/194]
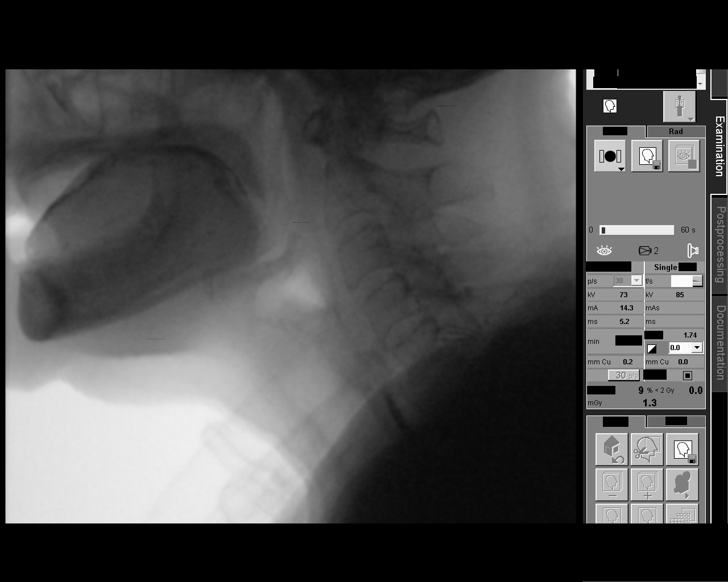

[Series 5: cup thin 2 · 1 of 132 frames shown]
[frame 67/132]
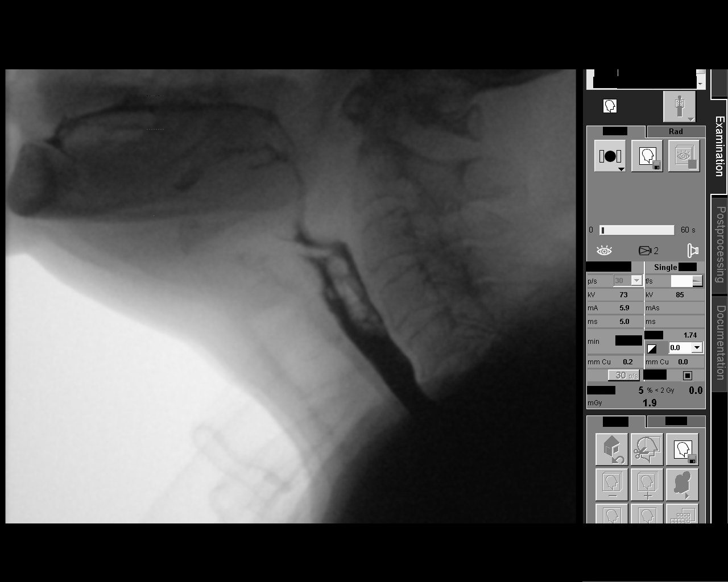

[Series 6: puree 1 · 1 of 209 frames shown]
[frame 202/209]
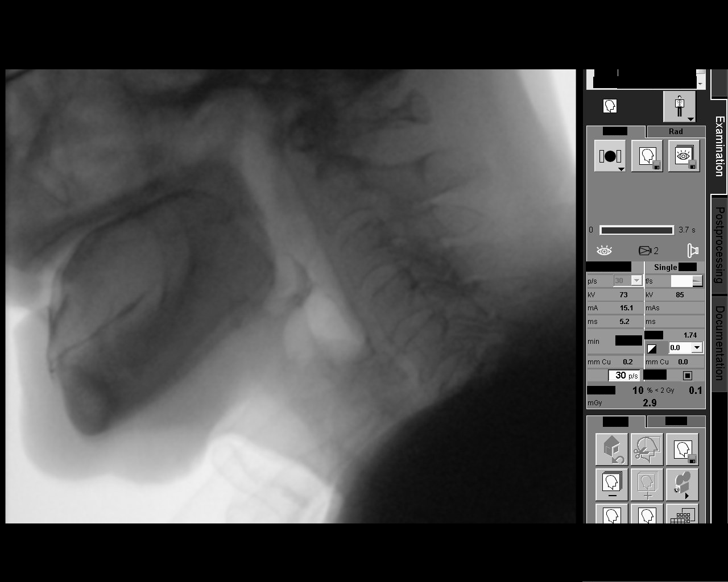

[Series 7: puree 2 · 1 of 200 frames shown]
[frame 174/200]
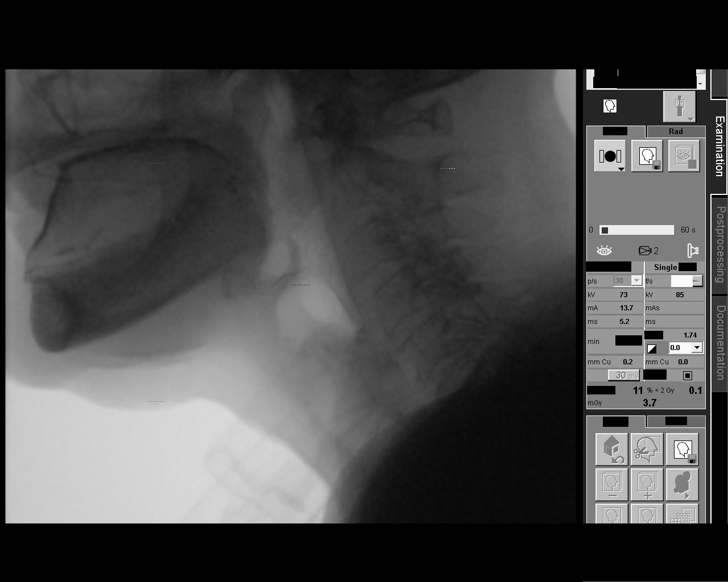

[Series 8: regular textures · 1 of 833 frames shown]
[frame 161/833]
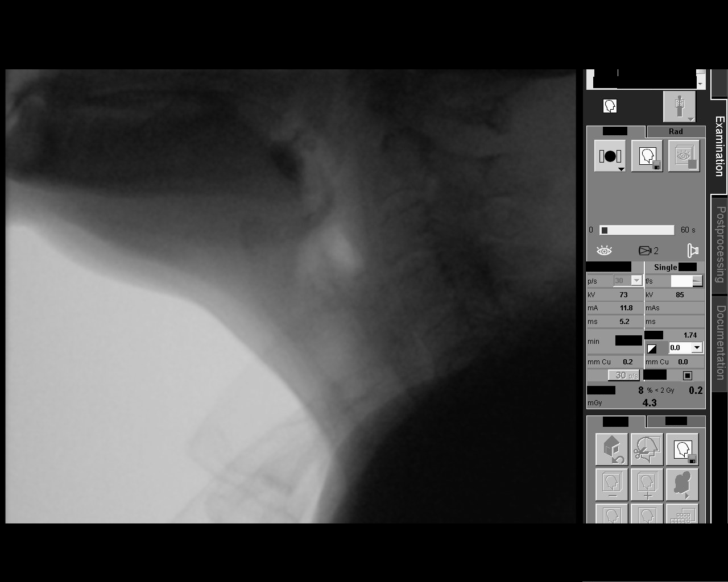

[Series 9: pill with cup thin · 1 of 311 frames shown]
[frame 156/311]
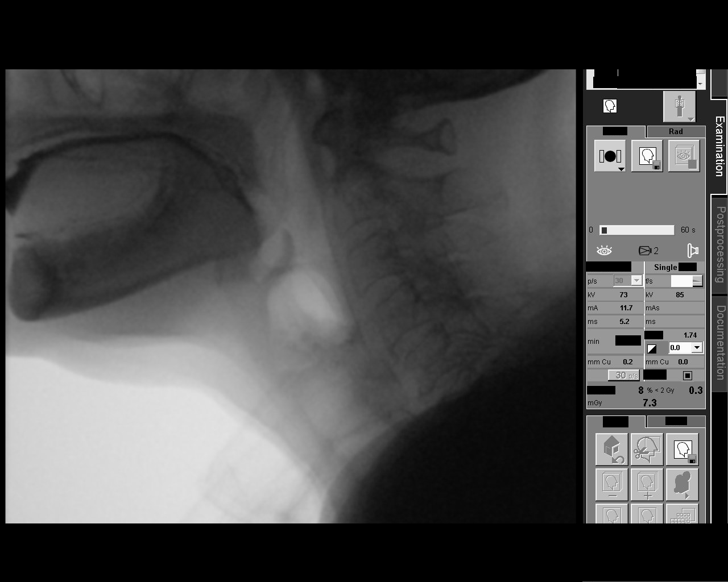

[Series 10: esophageal sweep · 1 of 183 frames shown]
[frame 114/183]
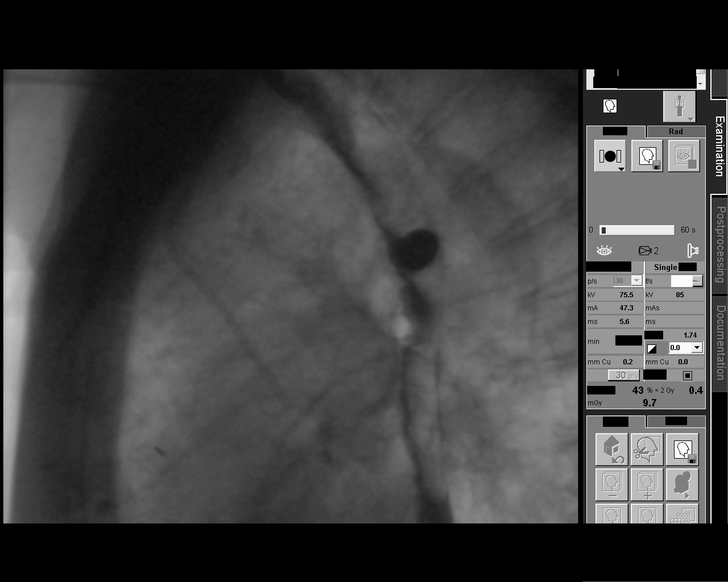

[Series 11: straw thin · 1 of 576 frames shown]
[frame 462/576]
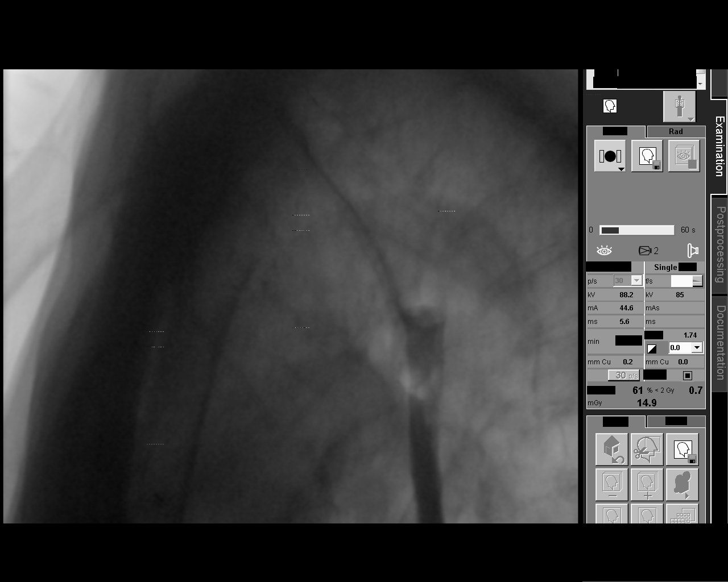

[Series 12: sweep, pill  and bite puree · 1 of 208 frames shown]
[frame 177/208]
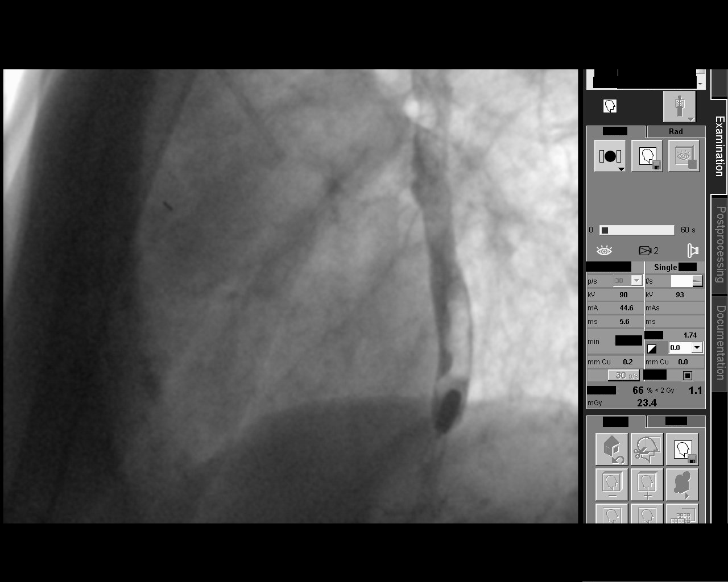

[Series 13: pill drops after puree · 1 of 415 frames shown]
[frame 413/415]
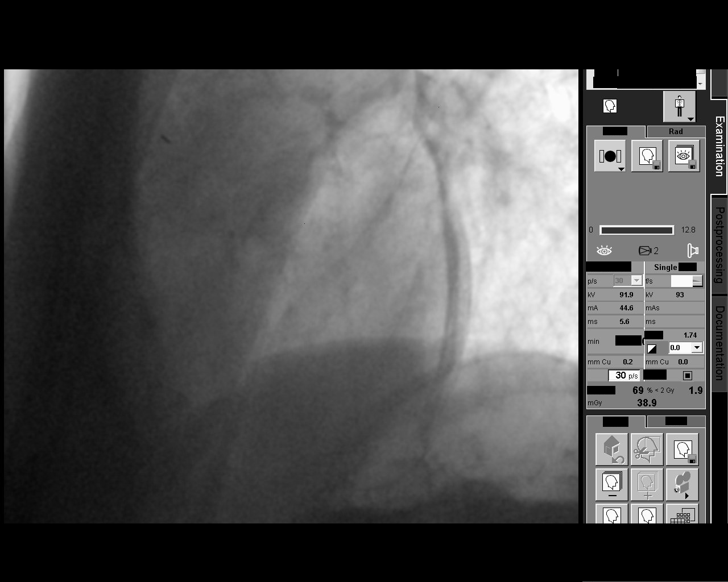

[13 of 24 positions shown; findings below may reference images not displayed]

FINDINGS: Modified barium swallow was performed by the speech pathologist.

Negative for aspiration/penetration.

Please refer to the Speech Pathology report for results and
recommendations.
IMPRESSION: Please refer to the Speech Pathologists report for complete details
and recommendations.

## 2021-12-20 IMAGING — RF DG SWALLOWING FUNCTION
1 series · 1 of 1 positions shown · non-contrast
Comparison: None.

CLINICAL DATA: Dysphagia. Cough/GE reflux disease/other secondary
diagnosis

EXAM:
MODIFIED BARIUM SWALLOW
TECHNIQUE: Different consistencies of barium were administered orally to the
patient by the Speech Pathologist. Imaging of the pharynx was
performed in the lateral projection.
FLUOROSCOPY TIME:  Fluoroscopy Time:  1 minutes 6 seconds
Radiation Exposure Index (if provided by the fluoroscopic device):
39.2 mGy
Number of Acquired Spot Images: 0

[Series 1: cp_standard · 0.25mm/px · 1 of 1 slices shown]
[im 1/1]
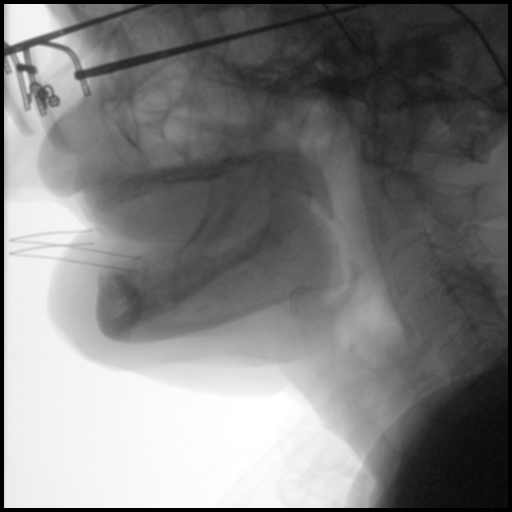

[1 of 1 positions shown; findings below may reference images not displayed]

FINDINGS: Modified barium swallow was performed by the speech pathologist.

Negative for aspiration/penetration.

Please refer to the Speech Pathology report for results and
recommendations.
IMPRESSION: Please refer to the Speech Pathologists report for complete details
and recommendations.

## 2021-12-20 NOTE — Therapy (Signed)
[facility] [place], [place], [address] Phone: [phone]   Fax:  [phone]  Modified Barium Swallow  Patient Details  Name: Elizabeth Wu MRN: [id number] Date of Birth: 12-27-1950 No data recorded  Encounter Date: 12/20/2021   End of Session - 12/20/21 1226     Visit Number 1    Number of Visits 1    Authorization Type [employer]    SLP Start Time 1132    SLP Stop Time  1205    SLP Time Calculation (min) 33 min    Activity Tolerance Patient tolerated treatment well             Past Medical History:  Diagnosis Date   Allergy    Cancer ([place])    Breast   Complication of anesthesia    DJD (degenerative joint disease)    Hyperlipidemia    Hypertension    Osteopenia    PONV (postoperative nausea and vomiting)     Past Surgical History:  Procedure Laterality Date   APPENDECTOMY     BRONCHIAL BIOPSY  11/11/2021   Procedure: BRONCHIAL BIOPSIES;  Surgeon: Collene Gobble, MD;  Location: [facility];  Service: Pulmonary;;   BRONCHIAL BRUSHINGS  11/11/2021   Procedure: BRONCHIAL BRUSHINGS;  Surgeon: Collene Gobble, MD;  Location: [facility];  Service: Pulmonary;;   BRONCHIAL NEEDLE ASPIRATION BIOPSY  11/11/2021   Procedure: BRONCHIAL NEEDLE ASPIRATION BIOPSIES;  Surgeon: Collene Gobble, MD;  Location: [facility];  Service: Pulmonary;;   FIDUCIAL MARKER PLACEMENT  11/11/2021   Procedure: FIDUCIAL MARKER PLACEMENT;  Surgeon: Collene Gobble, MD;  Location: [facility];  Service: Pulmonary;;   HERNIA REPAIR     PORTACATH PLACEMENT Left 12/10/2021   Procedure: INSERTION PORT-A-CATH;  Surgeon: Virl Cagey, MD;  Location: [facility];  Service: General;  Laterality: Left;   TONSILLECTOMY     TONSILLECTOMY      There were no vitals filed for this visit.   Subjective Assessment - 12/20/21 1218     Subjective "I just don't have an appetite."    Special Tests MBSS    Currently in Pain? No/denies                  General - 12/20/21 1219       General Information   Date of Onset 12/15/21    HPI [name] is a 71 y.o. female who was referred for MBSS by Dr. Berniece Salines. 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 She is currently receiving anastrozole. She is pending start of immunotherapy with Keytruda on 1/26. RD reports: Patient is taking appetite stimulant (Megace). She is drinking 2 Ensure Plus daily. Patient endorses dysphagia, states her "last real meal was 09/28" Patient reports difficulty with regular textures for "the longest time but managed." This has worsened in the last few months, now limited to foods that "slide down easy" recalling jello, oatmeal, pudding, mashed potatoes, soups, hard boiled eggs.    Type of Study MBS-Modified Barium Swallow Study    Diet Prior to this Study Regular;Thin liquids    Temperature Spikes Noted No    Respiratory Status Room air    History of Recent Intubation No    Behavior/Cognition Alert;Cooperative;Pleasant mood    Oral Cavity Assessment Within Functional Limits    Oral Care Completed by SLP No    Oral Cavity - Dentition Edentulous    Vision Functional  for self feeding    Self-Feeding Abilities Able to feed self    Patient Positioning Upright in chair    Baseline Vocal Quality Normal    Volitional Cough Strong    Volitional Swallow Able to elicit    Anatomy Within functional limits    Pharyngeal Secretions Not observed secondary MBS                Oral Preparation/Oral Phase - 12/20/21 1220       Oral Preparation/Oral Phase   Oral Phase Impaired      Oral - Thin   Oral - Thin Teaspoon Within functional limits    Oral - Thin Cup Within functional limits    Oral - Thin Straw Within functional limits      Oral - Solids   Oral - Puree Within functional limits    Oral - Regular Piecemeal swallowing;Imparied mastication;Delayed A-P transit    Oral - Pill  Within functional limits      Electrical stimulation - Oral Phase   Was Electrical Stimulation Used No              Pharyngeal Phase - 12/20/21 1221       Pharyngeal Phase   Pharyngeal Phase Within functional limits      Electrical Stimulation - Pharyngeal Phase   Was Electrical Stimulation Used No              Cricopharyngeal Phase - 12/20/21 1221       Cervical Esophageal Phase   Cervical Esophageal Phase Impaired      Cervical Esophageal Phase - Comment   Other Esophageal Phase Observations delayed emptying in esophagus with solids and barium tablet, but did eventually clear               Plan - 12/20/21 1227     Clinical Impression Statement MBSS completed this date. Pt presents with mild oral phase dysphagia, WNL pharyngeal phase dysphagia, and suspected primary esophageal dysphagia. 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 Pt with timely swallow initiation, hyolaryngeal excursion, no penetration/aspiration, or significant residuals. Esophageal sweep reveals delayed emptying with solids and barium tablet, but did eventually clear with puree and liquid wash. Pt initially stated she did not have any difficulty swallowing, but stated that she didn't have an appetite. After discussing with Pt's sister, she stated that she had some trouble with solids and felt they were more difficult to masticate. Given that Pt is edentulous and likely with xerostomia, she may benefit from adding moisture to solids and cut up solids into small pieces. Recommend self regulated regular textures and thin liquids with reflux precautions (provided in written form to Pt). No further SLP services indicated at this time.    Consulted and Agree with Plan of Care Patient             Patient will benefit from skilled therapeutic intervention in order to improve the  following deficits and impairments:   Dysphagia, oropharyngeal phase     Recommendations/Treatment - 12/20/21 1225       Swallow Evaluation Recommendations   Recommended Consults Consider esophageal assessment    SLP Diet Recommendations Age appropriate regular;Thin    Liquid Administration via Cup;Straw    Medication Administration Whole meds with liquid    Supervision Patient able to self feed    Postural Changes Seated upright at 90 degrees;Remain upright for at  least 30 minutes after feeds/meals              Prognosis - 12/20/21 1226       Prognosis   Prognosis for Safe Diet Advancement Good      Individuals Consulted   Consulted and Agree with Results and Recommendations Patient    Report Sent to  Referring physician             Problem List Patient Active Problem List   Diagnosis Date Noted   Primary lung adenocarcinoma ([place]) 12/10/2021   Atrial flutter ([place]) 12/09/2021   Pulmonary nodules 11/05/2021   Malignant neoplasm of upper-outer quadrant of right female breast ([place]) 09/30/2021   Malignant neoplasm of upper-inner quadrant of left female breast ([place]) 09/30/2021   Malignant neoplasm of upper-outer quadrant of left female breast ([place]) 09/30/2021   Breast lump in female 08/06/2021   Pain in right hip 06/03/2021   Pelvic fracture ([place]) 06/03/2021   Viral upper respiratory tract infection 04/14/2021   Chronic constipation 05/20/2020   Hypertension 05/29/2019   Cholelithiases 09/13/2016   Bursitis, hip 04/05/2016   Healthcare maintenance 11/04/2015   Meralgia paraesthetica 04/22/2014   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, [place], [address] Phone:  [phone]   Fax:  [phone]  Name: Elizabeth Wu MRN: [id number] Date of Birth: 06-24-1951

## 2021-12-21 ENCOUNTER — Inpatient Hospital Stay (HOSPITAL_COMMUNITY): Payer: Medicare HMO

## 2021-12-21 VITALS — BP 124/54 | HR 87 | Temp 98.2°F | Resp 18

## 2021-12-21 DIAGNOSIS — C792 Secondary malignant neoplasm of skin: Secondary | ICD-10-CM | POA: Diagnosis not present

## 2021-12-21 DIAGNOSIS — Z5112 Encounter for antineoplastic immunotherapy: Secondary | ICD-10-CM | POA: Diagnosis not present

## 2021-12-21 DIAGNOSIS — C50411 Malignant neoplasm of upper-outer quadrant of right female breast: Secondary | ICD-10-CM

## 2021-12-21 DIAGNOSIS — C786 Secondary malignant neoplasm of retroperitoneum and peritoneum: Secondary | ICD-10-CM | POA: Diagnosis not present

## 2021-12-21 DIAGNOSIS — C3431 Malignant neoplasm of lower lobe, right bronchus or lung: Secondary | ICD-10-CM | POA: Diagnosis not present

## 2021-12-21 DIAGNOSIS — C3491 Malignant neoplasm of unspecified part of right bronchus or lung: Secondary | ICD-10-CM

## 2021-12-21 DIAGNOSIS — C7931 Secondary malignant neoplasm of brain: Secondary | ICD-10-CM | POA: Diagnosis not present

## 2021-12-21 DIAGNOSIS — C3411 Malignant neoplasm of upper lobe, right bronchus or lung: Secondary | ICD-10-CM | POA: Diagnosis not present

## 2021-12-21 DIAGNOSIS — C50212 Malignant neoplasm of upper-inner quadrant of left female breast: Secondary | ICD-10-CM | POA: Diagnosis not present

## 2021-12-21 DIAGNOSIS — Z170 Estrogen receptor positive status [ER+]: Secondary | ICD-10-CM | POA: Diagnosis not present

## 2021-12-21 DIAGNOSIS — C3490 Malignant neoplasm of unspecified part of unspecified bronchus or lung: Secondary | ICD-10-CM

## 2021-12-21 LAB — CBC WITH DIFFERENTIAL/PLATELET
Abs Immature Granulocytes: 0.03 10*3/uL (ref 0.00–0.07)
Basophils Absolute: 0.1 10*3/uL (ref 0.0–0.1)
Basophils Relative: 1 %
Eosinophils Absolute: 0 10*3/uL (ref 0.0–0.5)
Eosinophils Relative: 0 %
HCT: 36.2 % (ref 36.0–46.0)
Hemoglobin: 12 g/dL (ref 12.0–15.0)
Immature Granulocytes: 0 %
Lymphocytes Relative: 26 %
Lymphs Abs: 2 10*3/uL (ref 0.7–4.0)
MCH: 31.5 pg (ref 26.0–34.0)
MCHC: 33.1 g/dL (ref 30.0–36.0)
MCV: 95 fL (ref 80.0–100.0)
Monocytes Absolute: 0.5 10*3/uL (ref 0.1–1.0)
Monocytes Relative: 6 %
Neutro Abs: 5.2 10*3/uL (ref 1.7–7.7)
Neutrophils Relative %: 67 %
Platelets: 472 10*3/uL — ABNORMAL HIGH (ref 150–400)
RBC: 3.81 MIL/uL — ABNORMAL LOW (ref 3.87–5.11)
RDW: 12.8 % (ref 11.5–15.5)
WBC: 7.8 10*3/uL (ref 4.0–10.5)
nRBC: 0 % (ref 0.0–0.2)

## 2021-12-21 LAB — COMPREHENSIVE METABOLIC PANEL
ALT: 115 U/L — ABNORMAL HIGH (ref 0–44)
AST: 83 U/L — ABNORMAL HIGH (ref 15–41)
Albumin: 3.4 g/dL — ABNORMAL LOW (ref 3.5–5.0)
Alkaline Phosphatase: 62 U/L (ref 38–126)
Anion gap: 8 (ref 5–15)
BUN: 32 mg/dL — ABNORMAL HIGH (ref 8–23)
CO2: 26 mmol/L (ref 22–32)
Calcium: 9.2 mg/dL (ref 8.9–10.3)
Chloride: 102 mmol/L (ref 98–111)
Creatinine, Ser: 0.82 mg/dL (ref 0.44–1.00)
GFR, Estimated: >60 mL/min (ref >60)
Glucose, Bld: 179 mg/dL — ABNORMAL HIGH (ref 70–99)
Potassium: 3.5 mmol/L (ref 3.5–5.1)
Sodium: 136 mmol/L (ref 135–145)
Total Bilirubin: 1 mg/dL (ref 0.3–1.2)
Total Protein: 7.2 g/dL (ref 6.5–8.1)

## 2021-12-21 LAB — TSH: TSH: 1.774 u[IU]/mL (ref 0.350–4.500)

## 2021-12-21 MED ORDER — HYDROCODONE-ACETAMINOPHEN 5-325 MG PO TABS: 1.0000 | ORAL_TABLET | Freq: Once | ORAL | Status: DC

## 2021-12-21 MED ORDER — SODIUM CHLORIDE 0.9 % IV SOLN: Freq: Once | INTRAVENOUS | Status: AC

## 2021-12-21 MED ORDER — SODIUM CHLORIDE 0.9% FLUSH
10.0000 mL | INTRAVENOUS | Status: DC | PRN
  Administered 2021-12-21: 10 mL

## 2021-12-21 MED ORDER — HEPARIN SOD (PORK) LOCK FLUSH 100 UNIT/ML IV SOLN
500.0000 [IU] | Freq: Once | INTRAVENOUS | Status: AC | PRN
  Administered 2021-12-21: 500 [IU]

## 2021-12-21 MED ORDER — SODIUM CHLORIDE 0.9 % IV SOLN
200.0000 mg | Freq: Once | INTRAVENOUS | Status: AC
  Administered 2021-12-21: 200 mg via INTRAVENOUS
  Filled 2021-12-21: qty 8

## 2021-12-21 MED ORDER — HYDROCODONE-ACETAMINOPHEN 5-325 MG PO TABS
2.0000 | ORAL_TABLET | Freq: Once | ORAL | Status: DC
  Filled 2021-12-21: qty 2

## 2021-12-21 MED ORDER — HYDROCODONE-ACETAMINOPHEN 5-325 MG PO TABS
2.0000 | ORAL_TABLET | Freq: Once | ORAL | Status: AC
  Administered 2021-12-21: 2 via ORAL

## 2021-12-21 NOTE — [facility] Notes (Addendum)
Pt presents today for C1D1 Keytruda per provider's order. Vital signs and other labs WNL for treatment today. Pt's ALT 115 and AST 83. MD made aware okay to proceed with treatment today.  Pt c/o leg pain 7/10 and stated she forget her pain medication at home, and wanted to know if Dr.K could give her something for pain while in the clinic. Orders received from Dr.K to give 2 5-325 mg tablets x 1 dose.  C1D1 Keytruda and 2 5-325 mg tablets x 1 dose given today per MD orders. Tolerated infusion without adverse affects. Vital signs stable. No complaints at this time. Discharged from clinic via wheelchair in stable condition. Alert and oriented x 3. F/U with [facility] as scheduled.

## 2021-12-22 ENCOUNTER — Telehealth (HOSPITAL_COMMUNITY): Payer: Self-pay

## 2021-12-22 LAB — T4: T4, Total: 8.3 ug/dL (ref 4.5–12.0)

## 2021-12-22 NOTE — Telephone Encounter (Signed)
Chemotherapy 24 hour follow up phone call.  Spoke with the patient and denied any problems after treatment.  Denied SOB, rash, headaches, or any other problems.  Reviewed numbers to call for after hours with understanding verbalized.

## 2021-12-23 ENCOUNTER — Other Ambulatory Visit (HOSPITAL_COMMUNITY): Payer: Medicare HMO

## 2021-12-23 ENCOUNTER — Ambulatory Visit (HOSPITAL_COMMUNITY): Payer: Medicare HMO | Admitting: Physician Assistant

## 2021-12-27 NOTE — Progress Notes (Signed)
[place] [facility] [address], [place] [address] Phone: [phone] Fax: [place] PROGRESS NOTE   Elizabeth Wu [id number] 1951/06/24 71 y.o.  Vaughan Sine is managed by Dr. Delton Coombes for stage IVb primary lung adenocarcinoma  Actively treated with chemotherapy/immunotherapy/hormonal therapy: YES  Current therapy: Pembrolizumab Beryle Flock)  Last treated: 12/21/2021  Next scheduled appointment with provider: 01/11/2022  Subjective:  Chief Complaint: Symptom management & toxicity check following immunotherapy  Elizabeth Wu is managed by Dr. Delton Coombes for stage IVb primary lung adenocarcinoma.  She had her first dose of Keytruda on 12/21/2021 and returns today for symptom management, labs, and toxicity check following her immunotherapy.  She  reports that she  tolerated her Keytruda infusion well last week, but continues to have ongoing symptoms of extreme fatigue, constipation, poor appetite, and uncontrolled pain.  FATIGUE: She reports extreme fatigue, reports that she is not even able to walk to the bathroom without assistance.  This has been ongoing and progressive over the past 2 to 3 months.  CONSTIPATION: She continues to feel constipated, has not had a bowel movement in 5 days.  She was taking Colace and Senokot for constipation, but when she started having diarrhea about 10 days ago, she stopped her laxatives and started taking Imodium.  Since that time, she has been constipated.  She feels "like there is something they are trying to come out," but that she is unable to have bowel movement.  She is not currently taking any stool softeners.  PAIN: She continues to have cancer related pain in her back, chest wall, and upper abdomen.  Pain is worsened with deep breath and with movement, up to 8/10 in intensity.  Some relief by resting in bed and being still.  She also has chronic pain of her right hip that radiates  down to her foot, related to hip fracture from January 2022 that was left unrepaired.  She has been taking hydrocodone 10/325 every 6 hours, experiencing some mild relief that lasts for an hour or two.  She reports that she took oxycodone in the past and had some relief.  She is having difficulty sleeping secondary to pain.  APPETITE & WEIGHT LOSS: She continues to have poor appetite, will eat 1-2 containers of applesauce, pudding, or yogurt daily and will drink 2 Ensure per day.  Otherwise, she is not taking in any other calories.  She drinks 16 to 32 ounces of water daily.  She has lost 4 pounds in the past week.  OTHER SYMPTOMS: - She has had dark urine x1 week.  Denies any dysuria or decreased amounts of urine. - She notes whitish plaque over her tongue for the past several months. - She reports continued dyspnea on exertion and pleuritic chest pain.  She denies any nausea, vomiting, or mouth sores.  She denies any persistent diarrhea.  She has not noticed any rash or skin changes.  No peripheral edema.  No new shortness of breath or cough.  No new neuropathy.  No headaches, seizures, or mental status changes.  No fevers or chills.  She has not noticed any bruising or bleeding.   Review of Systems:  Review of Systems  Constitutional:  Positive for activity change, appetite change, fatigue and unexpected weight change. Negative for chills, diaphoresis and fever.  HENT:  Negative for mouth sores, nosebleeds, sore throat and trouble swallowing.   Respiratory:  Positive for shortness of breath (with exertion). Negative for cough.  Cardiovascular:  Positive for chest pain (pleuritic chest wall pain with deep breaths). Negative for palpitations and leg swelling.  Gastrointestinal:  Positive for abdominal pain and constipation. Negative for blood in stool, diarrhea, nausea and vomiting.  Genitourinary:  Negative for dysuria and hematuria.       Dark urine  Musculoskeletal:  Positive for  arthralgias and back pain.  Skin:  Negative for rash.  Neurological:  Negative for dizziness, light-headedness, numbness and headaches.  Psychiatric/Behavioral:  Positive for sleep disturbance. Negative for dysphoric mood. The patient is not nervous/anxious.     Past Medical History, Surgical history, Social history, and Family history were reviewed as documented elsewhere in chart, and were updated as appropriate.   Assessment & Plan:    1.  Stage IVb primary lung adenocarcinoma - Primary oncologist is Dr. Delton Coombes - Diagnosed January 2023 - First dose of pembrolizumab received on 12/21/2021 - Labs today show essentially unremarkable CBC (platelets mildly elevated at 446).  CMP shows elevated BUN 35/creatinine 0.79; ALT 98, AST 39, bilirubin 1.7. -  She seems to have tolerated Keytruda fairly well, and the the bulk of her symptoms are related to her cancer and were present prior to starting treatment. - PLAN:  Follow-up with Dr. Delton Coombes as scheduled for labs, treatment, and MD visit on 01/11/2022  2.  Bilateral breast cancer - Diagnosed October 2022 - Definitive treatment not offered due to metastatic lung cancer - She is taking anastrozole daily, which she is tolerating well  - PLAN: Continue anastrozole daily.  3.  Weight loss & low appetite - 25 pound weight loss over the past year due to decreased appetite - Weight today is down 4 pounds from last week  - Very little oral intake, drinking 2 Ensure per day - PLAN: Continue to encourage oral intake, including 5 Ensure per day. - She is being followed by cancer center nutritionist.  4.  Constipation - She was taking Colace and Senokot for constipation, but started having diarrhea about 10 days ago, stopped laxatives and took Imodium - Since that time she has been constipated - She feels "like there is something trying to come out" but that she is unable to have bowel movement - She is not currently taking any stool softeners -  PLAN: Given instructions to take lactulose 30 mL x 1 dose as well as Dulcolax suppository x1 dose today. - If no bowel movement tomorrow, by tomorrow, can repeat lactulose in the morning and again in the evening. - If no bowel movement in 2 days, instructed to call office. - Once she has had a bowel movement, she is instructed to start taking Colace 2 tablets nightly with lactulose as needed. - Instructed that if she has diarrhea, she should stop stool softeners but not take any Imodium until she has spoken with the clinic for further advice.  5.  Pain management - Cancer related pain of back, chest wall, and upper abdomen.  Chronic right hip pain related to unrepaired fracture. - She is taking hydrocodone 10/325 mg daily, with little relief - She reports that she has previously had some relief with oxycodone - PLAN: Hydrocodone discontinued.  Prescription sent to pharmacy for oxycodone 10/325 mg, 1 tablet every 4 hours for pain. - Patient educated on opioid toxicity and overdose precautions. - Patient instructed to call clinic if her pain remains unmanaged despite these changes.  6.  Brain metastases - MRI brain showed 4 mm nodular enhancing focus along left posterior central gyrus -  No headaches, seizures, or altered mental status - PLAN: Repeat MRI brain in 3 months per Dr. Delton Coombes.  7.  Dehydration - She is drinking 16 to 32 ounces of water daily - Noted to have tachycardia, elevated BUN - Reports dark urine - PLAN: IV fluids given today (1 L NS over 2 hours) - Encouraged patient to drink 64 ounces of fluids daily - We will schedule for additional IV fluids on Friday 12/31/2021 (1 L NS over 2 hours) and Wednesday 01/05/2022 (1 L NS over 2 hours)  8.  Oral thrush - Whitish plaque on tongue, may represent oral thrush versus poor oral hygiene - PLAN: Educated on proper oral hygiene.  Prescription for nystatin oral liquid sent to pharmacy.   PLAN SUMMARY & DISPOSITION: - IV fluids  on Friday 12/31/2021 - IV fluids on Wednesday 01/05/2022 - Next scheduled visit with Dr. Raliegh Ip on 01/11/2022  Objective:   Physical Exam:  There were no vitals taken for this visit. ECOG: 3  Physical Exam Constitutional:      Appearance: Normal appearance.     Comments: Weak-appearing, fatigued  HENT:     Head: Normocephalic and atraumatic.     Mouth/Throat:     Mouth: Mucous membranes are moist.     Comments: Thick white plaque coating tongue Eyes:     Extraocular Movements: Extraocular movements intact.     Pupils: Pupils are equal, round, and reactive to light.  Cardiovascular:     Rate and Rhythm: Regular rhythm. Tachycardia present.     Pulses: Normal pulses.     Heart sounds: Normal heart sounds.  Pulmonary:     Effort: Pulmonary effort is normal.     Breath sounds: Rhonchi (anterior rhonchi cleared by cough) present.     Comments: Deferred auscultation of posterior lung fields - patient lying in supine position, significant pain when trying to turn. Abdominal:     General: Bowel sounds are normal.     Palpations: Abdomen is soft.     Tenderness: There is no abdominal tenderness.  Musculoskeletal:        General: No swelling.     Right lower leg: No edema.     Left lower leg: No edema.  Lymphadenopathy:     Cervical: No cervical adenopathy.  Skin:    General: Skin is warm and dry.  Neurological:     General: No focal deficit present.     Mental Status: She is alert and oriented to person, place, and time.  Psychiatric:        Mood and Affect: Mood normal.        Behavior: Behavior normal.    Lab Review:     Component Value Date/Time   NA 136 12/21/2021 1156   NA 142 07/21/2021 1530   K 3.5 12/21/2021 1156   CL 102 12/21/2021 1156   CO2 26 12/21/2021 1156   GLUCOSE 179 (H) 12/21/2021 1156   BUN 32 (H) 12/21/2021 1156   BUN 10 07/21/2021 1530   CREATININE 0.82 12/21/2021 1156   CREATININE 0.72 05/31/2013 0927   CALCIUM 9.2 12/21/2021 1156   PROT 7.2  12/21/2021 1156   PROT 7.3 03/01/2021 1001   ALBUMIN 3.4 (L) 12/21/2021 1156   ALBUMIN 4.7 03/01/2021 1001   AST 83 (H) 12/21/2021 1156   ALT 115 (H) 12/21/2021 1156   ALKPHOS 62 12/21/2021 1156   BILITOT 1.0 12/21/2021 1156   BILITOT 0.8 03/01/2021 1001   GFRNONAA >60 12/21/2021 1156   GFRNONAA >  89 05/31/2013 0927   GFRAA 81 11/30/2020 0931   GFRAA >89 05/31/2013 0927       Component Value Date/Time   WBC 7.8 12/21/2021 1156   RBC 3.81 (L) 12/21/2021 1156   HGB 12.0 12/21/2021 1156   HGB 12.7 05/20/2020 0941   HCT 36.2 12/21/2021 1156   HCT 37.3 05/20/2020 0941   PLT 472 (H) 12/21/2021 1156   PLT 376 05/20/2020 0941   MCV 95.0 12/21/2021 1156   MCV 96 05/20/2020 0941   MCH 31.5 12/21/2021 1156   MCHC 33.1 12/21/2021 1156   RDW 12.8 12/21/2021 1156   RDW 13.4 05/20/2020 0941   LYMPHSABS 2.0 12/21/2021 1156   LYMPHSABS 3.4 (H) 11/10/2017 1556   MONOABS 0.5 12/21/2021 1156   EOSABS 0.0 12/21/2021 1156   EOSABS 0.1 11/10/2017 1556   BASOSABS 0.1 12/21/2021 1156   BASOSABS 0.1 11/10/2017 1556   -------------------------------  Imaging from last 24 hours (if applicable):  Radiology interpretation: CT Angio Chest PE W and/or Wo Contrast  Result Date: 12/06/2021 CLINICAL DATA:  Pulmonary embolism suspected. High probability due to tachycardia and left-sided chest pain that began this morning EXAM: CT ANGIOGRAPHY CHEST WITH CONTRAST TECHNIQUE: Multidetector CT imaging of the chest was performed using the standard protocol during bolus administration of intravenous contrast. Multiplanar CT image reconstructions and MIPs were obtained to evaluate the vascular anatomy. RADIATION DOSE REDUCTION: This exam was performed according to the departmental dose-optimization program which includes automated exposure control, adjustment of the mA and/or kV according to patient size and/or use of iterative reconstruction technique. CONTRAST:  96mL OMNIPAQUE IOHEXOL 350 MG/ML SOLN COMPARISON:   Noncontrast chest CT 11/08/2021 FINDINGS: Cardiovascular: Satisfactory opacification of the pulmonary arteries to the segmental level. No evidence of pulmonary embolism. Normal heart size. No pericardial effusion. Atheromatous calcification of the aorta and coronaries. Irregular luminal plaque at the descending aorta. Mediastinum/Nodes: Negative for adenopathy Lungs/Pleura: Diffuse micro nodularity and fissure lobulation. Partially cavitary mass in the subpleural right lower lobe measuring 3.3 cm. No detected progression of these nodules but there is superimposed interlobular septal thickening. No pleural effusion. No focal consolidation. Upper Abdomen: No acute finding Musculoskeletal: No acute or aggressive finding. Other: 2 left breast masses. The lower and more medial now measures 18 mm as compared to 15 mm. Review of the MIP images confirms the above findings. IMPRESSION: 1. Negative for pulmonary embolism. 2. Mild interstitial edema superimposed on pulmonary metastatic disease with lymphangitic component. 3. Two left breast masses, 1 measuring mildly larger than on CT last month. Electronically Signed   By: Jorje Guild M.D.   On: 12/06/2021 11:35   MR Brain W Wo Contrast  Result Date: 11/29/2021 CLINICAL DATA:  Provided history: Malignant neoplasm of unspecified part of unspecified bronchus or lung. Non-small cell lung cancer, staging. EXAM: MRI HEAD WITHOUT AND WITH CONTRAST TECHNIQUE: Multiplanar, multiecho pulse sequences of the brain and surrounding structures were obtained without and with intravenous contrast. CONTRAST:  46mL GADAVIST GADOBUTROL 1 MMOL/ML IV SOLN COMPARISON:  PET-CT 10/21/2021. FINDINGS: Mild-to-moderate intermittent motion degradation. Brain: Mild generalized cerebral and cerebellar atrophy. 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 Elsewhere, there is mild multifocal T2 FLAIR hyperintense signal abnormality within the  cerebral white matter and pons, nonspecific but compatible with chronic small vessel ischemic disease. Small chronic lacunar infarcts within the bilateral basal ganglia. 4 mm nodular enhancing focus along the left postcentral gyrus (series 15, image 42) (series 16, image 11). There is no  acute infarct. No chronic intracranial blood products. No extra-axial fluid collection. No midline shift. Vascular: Maintained flow voids within the proximal large arterial vessels. Skull and upper cervical spine: 10 mm enhancing focus within the left parietal calvarium (series 15, image 51). Incompletely assessed cervical spondylosis. Sinuses/Orbits: Visualized orbits show no acute finding. No significant paranasal sinus disease. Other: Trace fluid within the bilateral mastoid air cells. Impression #2 and #3 will be called to the ordering clinician or representative by the Radiologist Assistant, and communication documented in the PACS or [employer]. IMPRESSION: 1. Intermittently motion degraded exam. 2. 4 mm nodular enhancing focus along the left postcentral gyrus, highly suspicious for a solitary intracranial metastasis. 3. 10 mm enhancing focus within the left parietal calvarium. Although nonspecific, osseous metastatic disease cannot be excluded. Attention recommended on follow-up. 4. 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 5. Chronic small vessel ischemic disease, as described. 6. Mild generalized parenchymal atrophy. 7. Trace fluid within the bilateral mastoid air cells. Electronically Signed   By: Kellie Simmering D.O.   On: 11/29/2021 17:33   DG OP Swallowing Func-Medicare/Speech Path  Result Date: 12/20/2021 Table formatting from the original result was not included. [facility] [place], [place], [address] Phone: [phone]   Fax:  [phone]   Modified Barium Swallow   Patient Details Name: CARLETHA DAWN MRN: [id number] Date  of Birth: [birth year] No data recorded   Encounter Date: 12/20/2021     End of Session - 12/20/21 1226      Visit Number 1    Number of Visits 1    Authorization Type [employer]    SLP Start Time 1132    SLP Stop Time  1205    SLP Time Calculation (min) 33 min    Activity Tolerance Patient tolerated treatment well                  Past Medical History: Diagnosis Date  Allergy    Cancer ([place])     Breast  Complication of anesthesia    DJD (degenerative joint disease)    Hyperlipidemia    Hypertension    Osteopenia    PONV (postoperative nausea and vomiting)           Past Surgical History: Procedure Laterality Date  APPENDECTOMY      BRONCHIAL BIOPSY   11/11/2021   Procedure: BRONCHIAL BIOPSIES;  Surgeon: Collene Gobble, MD;  Location: [facility];  Service: Pulmonary;;  BRONCHIAL BRUSHINGS   11/11/2021   Procedure: BRONCHIAL BRUSHINGS;  Surgeon: Collene Gobble, MD;  Location: [facility];  Service: Pulmonary;;  BRONCHIAL NEEDLE ASPIRATION BIOPSY   11/11/2021   Procedure: BRONCHIAL NEEDLE ASPIRATION BIOPSIES;  Surgeon: Collene Gobble, MD;  Location: [facility];  Service: Pulmonary;;  FIDUCIAL MARKER PLACEMENT   11/11/2021   Procedure: FIDUCIAL MARKER PLACEMENT;  Surgeon: Collene Gobble, MD;  Location: [facility];  Service: Pulmonary;;  HERNIA REPAIR      PORTACATH PLACEMENT Left 12/10/2021   Procedure: INSERTION PORT-A-CATH;  Surgeon: Virl Cagey, MD;  Location: [facility];  Service: General;  Laterality: Left;  TONSILLECTOMY      TONSILLECTOMY         There were no vitals filed for this visit.     Subjective Assessment - 12/20/21 1218      Subjective "I just don't have an appetite."    Special Tests MBSS    Currently in Pain? No/denies               ?  ?  General - 12/20/21 1219             General Information   Date of Onset 12/15/21    HPI Ms. LILLIANNE EICK is a 71 y.o. female who was referred for MBSS by Dr. Berniece Salines. Patient with metastatic right lung cancer to skin, omentum and  bilateral breast cancer. She is currently receiving anastrozole. She is pending start of immunotherapy with Keytruda on 1/26. RD reports: Patient is taking appetite stimulant (Megace). She is drinking 2 Ensure Plus daily. Patient endorses dysphagia, states her "last real meal was 09/28" Patient reports difficulty with regular textures for "the longest time but managed." This has worsened in the last few months, now limited to foods that "slide down easy" recalling jello, oatmeal, pudding, mashed potatoes, soups, hard boiled eggs.    Type of Study MBS-Modified Barium Swallow Study    Diet Prior to this Study Regular;Thin liquids    Temperature Spikes Noted No    Respiratory Status Room air    History of Recent Intubation No    Behavior/Cognition Alert;Cooperative;Pleasant mood    Oral Cavity Assessment Within Functional Limits    Oral Care Completed by SLP No    Oral Cavity - Dentition Edentulous    Vision Functional for self feeding    Self-Feeding Abilities Able to feed self    Patient Positioning Upright in chair    Baseline Vocal Quality Normal    Volitional Cough Strong    Volitional Swallow Able to elicit    Anatomy Within functional limits    Pharyngeal Secretions Not observed secondary MBS               ?      Oral Preparation/Oral Phase - 12/20/21 1220             Oral Preparation/Oral Phase   Oral Phase Impaired        Oral - Thin   Oral - Thin Teaspoon Within functional limits    Oral - Thin Cup Within functional limits    Oral - Thin Straw Within functional limits        Oral - Solids   Oral - Puree Within functional limits    Oral - Regular Piecemeal swallowing;Imparied mastication;Delayed A-P transit    Oral - Pill Within functional limits        Electrical stimulation - Oral Phase   Was Electrical Stimulation Used No                 Pharyngeal Phase - 12/20/21 1221             Pharyngeal Phase   Pharyngeal Phase Within functional limits        Electrical Stimulation - Pharyngeal Phase   Was Electrical  Stimulation Used No                 Cricopharyngeal Phase - 12/20/21 1221             Cervical Esophageal Phase   Cervical Esophageal Phase Impaired        Cervical Esophageal Phase - Comment   Other Esophageal Phase Observations delayed emptying in esophagus with solids and barium tablet, but did eventually clear               ?    Plan - 12/20/21 1227      Clinical Impression Statement MBSS completed this date. Pt presents with mild oral phase dysphagia, WNL pharyngeal phase dysphagia, and suspected primary esophageal  dysphagia. 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 Pt with timely swallow initiation, hyolaryngeal excursion, no penetration/aspiration, or significant residuals. Esophageal sweep reveals delayed emptying with solids and barium tablet, but did eventually clear with puree and liquid wash. Pt initially stated she did not have any difficulty swallowing, but stated that she didn't have an appetite. After discussing with Pt's sister, she stated that she had some trouble with solids and felt they were more difficult to masticate. Given that Pt is edentulous and likely with xerostomia, she may benefit from adding moisture to solids and cut up solids into small pieces. Recommend self regulated regular textures and thin liquids with reflux precautions (provided in written form to Pt). No further SLP services indicated at this time.    Consulted and Agree with Plan of Care Patient               Patient will benefit from skilled therapeutic intervention in order to improve the following deficits and impairments:  Dysphagia, oropharyngeal phase   ?      Recommendations/Treatment - 12/20/21 1225             Swallow Evaluation Recommendations   Recommended Consults Consider esophageal assessment    SLP Diet Recommendations Age appropriate regular;Thin    Liquid Administration via Cup;Straw     Medication Administration Whole meds with liquid    Supervision Patient able to self feed    Postural Changes Seated upright at 90 degrees;Remain upright for at least 30 minutes after feeds/meals                 Prognosis - 12/20/21 1226             Prognosis   Prognosis for Safe Diet Advancement Good        Individuals Consulted   Consulted and Agree with Results and Recommendations Patient    Report Sent to  Referring physician               Problem List    Patient Active Problem List   Diagnosis Date Noted  Primary lung adenocarcinoma ([place]) 12/10/2021  Atrial flutter ([place]) 12/09/2021  Pulmonary nodules 11/05/2021  Malignant neoplasm of upper-outer quadrant of right female breast ([place]) 09/30/2021  Malignant neoplasm of upper-inner quadrant of left female breast ([place]) 09/30/2021  Malignant neoplasm of upper-outer quadrant of left female breast ([place]) 09/30/2021  Breast lump in female 08/06/2021  Pain in right hip 06/03/2021  Pelvic fracture ([place]) 06/03/2021  Viral upper respiratory tract infection 04/14/2021  Chronic constipation 05/20/2020  Hypertension 05/29/2019  Cholelithiases 09/13/2016  Bursitis, hip 04/05/2016  Healthcare maintenance 11/04/2015  Meralgia paraesthetica 04/22/2014  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 Cough/GE reflux disease/other secondary diagnosis EXAM: MODIFIED BARIUM SWALLOW TECHNIQUE: Different consistencies of barium were administered orally to the patient by the Speech Pathologist. Imaging of the pharynx was performed in the lateral projection.  FLUOROSCOPY TIME:  Fluoroscopy Time:  1 minutes 6 seconds Radiation Exposure Index (if provided by  the fluoroscopic device): 39.2 mGy Number of Acquired Spot Images: 0 COMPARISON:  None. FINDINGS: Modified barium swallow was performed by the speech pathologist. Negative for aspiration/penetration. Please refer to the Speech Pathology report for results and recommendations. IMPRESSION: Please refer to the Speech Pathologists report for complete details and recommendations. Electronically Signed   By: Corrie Mckusick D.O.   On: 12/20/2021 12:18   DG Chest Port 1 View  Result Date: 12/10/2021 CLINICAL DATA:  71 year old female left Port-A-Cath placement. Metastatic breast cancer. EXAM: PORTABLE CHEST 1 VIEW COMPARISON:  Chest CTA 12/06/2021 and earlier. FINDINGS: Portable AP semi upright view at 1004 hours. Left subclavian approach power port placed. No pneumothorax or adverse features identified. Stable cardiac size and mediastinal contours. Extensive bilateral reticulonodular abnormal pulmonary opacity persists. No pleural effusion or consolidation. Negative visible bowel gas. Stable visualized osseous structures. IMPRESSION: 1. Left chest Port-A-Cath with no adverse features. 2. 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 Electronically Signed   By: Genevie Ann M.D.   On: 12/10/2021 10:13   DG Chest Port 1 View  Result Date: 12/06/2021 CLINICAL DATA:  Shortness of breath.  Tachycardia. EXAM: PORTABLE CHEST 1 VIEW COMPARISON:  11/11/2021 and chest CT 08/02/2021 FINDINGS: Prominent interstitial lung markings appear to be chronic. In addition, there are tiny nodular densities in lungs and similar to the prior chest CT. No significant airspace disease or lung consolidation. Heart size is within normal limits and stable. Trachea is midline. Negative for a pneumothorax. No overt pulmonary edema. IMPRESSION: Chronic lung changes without  acute findings. Electronically Signed   By: Markus Daft M.D.   On: 12/06/2021 10:02   DG C-Arm 1-60 Min-No Report  Result Date: 12/10/2021 Fluoroscopy was utilized by the requesting physician.  No radiographic interpretation.      Wrap-Up:    PLAN SUMMARY & DISPOSITION: - IV fluids on Friday 12/31/2021 - IV fluids on Wednesday 01/05/2022 - Next scheduled visit with Dr. Raliegh Ip on 01/11/2022  All questions were answered. The patient knows to call the clinic with any problems, questions or concerns.  Medical decision making: Moderate  Time spent on visit: I spent 25 minutes counseling the patient face to face. The total time spent in the appointment was 40 minutes and more than 50% was on counseling.   Harriett Rush, PA-C  12/28/21 4:38 PM

## 2021-12-28 ENCOUNTER — Inpatient Hospital Stay (HOSPITAL_COMMUNITY): Payer: Medicare HMO | Attending: Hematology

## 2021-12-28 ENCOUNTER — Inpatient Hospital Stay (HOSPITAL_BASED_OUTPATIENT_CLINIC_OR_DEPARTMENT_OTHER): Payer: Medicare HMO | Admitting: Physician Assistant

## 2021-12-28 ENCOUNTER — Other Ambulatory Visit: Payer: Self-pay

## 2021-12-28 VITALS — BP 127/79 | HR 96 | Temp 96.0°F | Resp 18

## 2021-12-28 DIAGNOSIS — E860 Dehydration: Secondary | ICD-10-CM

## 2021-12-28 DIAGNOSIS — R110 Nausea: Secondary | ICD-10-CM | POA: Diagnosis not present

## 2021-12-28 DIAGNOSIS — C50411 Malignant neoplasm of upper-outer quadrant of right female breast: Secondary | ICD-10-CM

## 2021-12-28 DIAGNOSIS — R634 Abnormal weight loss: Secondary | ICD-10-CM | POA: Insufficient documentation

## 2021-12-28 DIAGNOSIS — R5383 Other fatigue: Secondary | ICD-10-CM | POA: Diagnosis not present

## 2021-12-28 DIAGNOSIS — Z09 Encounter for follow-up examination after completed treatment for conditions other than malignant neoplasm: Secondary | ICD-10-CM

## 2021-12-28 DIAGNOSIS — C3491 Malignant neoplasm of unspecified part of right bronchus or lung: Secondary | ICD-10-CM

## 2021-12-28 DIAGNOSIS — B370 Candidal stomatitis: Secondary | ICD-10-CM

## 2021-12-28 DIAGNOSIS — R059 Cough, unspecified: Secondary | ICD-10-CM | POA: Insufficient documentation

## 2021-12-28 DIAGNOSIS — Z79811 Long term (current) use of aromatase inhibitors: Secondary | ICD-10-CM | POA: Diagnosis not present

## 2021-12-28 DIAGNOSIS — Z5112 Encounter for antineoplastic immunotherapy: Secondary | ICD-10-CM | POA: Insufficient documentation

## 2021-12-28 DIAGNOSIS — C3431 Malignant neoplasm of lower lobe, right bronchus or lung: Secondary | ICD-10-CM | POA: Insufficient documentation

## 2021-12-28 DIAGNOSIS — R630 Anorexia: Secondary | ICD-10-CM | POA: Diagnosis not present

## 2021-12-28 DIAGNOSIS — C7931 Secondary malignant neoplasm of brain: Secondary | ICD-10-CM | POA: Diagnosis not present

## 2021-12-28 DIAGNOSIS — C3490 Malignant neoplasm of unspecified part of unspecified bronchus or lung: Secondary | ICD-10-CM

## 2021-12-28 DIAGNOSIS — I10 Essential (primary) hypertension: Secondary | ICD-10-CM | POA: Insufficient documentation

## 2021-12-28 DIAGNOSIS — C792 Secondary malignant neoplasm of skin: Secondary | ICD-10-CM | POA: Insufficient documentation

## 2021-12-28 DIAGNOSIS — M8580 Other specified disorders of bone density and structure, unspecified site: Secondary | ICD-10-CM | POA: Diagnosis not present

## 2021-12-28 DIAGNOSIS — K5900 Constipation, unspecified: Secondary | ICD-10-CM

## 2021-12-28 DIAGNOSIS — E785 Hyperlipidemia, unspecified: Secondary | ICD-10-CM | POA: Diagnosis not present

## 2021-12-28 DIAGNOSIS — R000 Tachycardia, unspecified: Secondary | ICD-10-CM | POA: Insufficient documentation

## 2021-12-28 DIAGNOSIS — Z95828 Presence of other vascular implants and grafts: Secondary | ICD-10-CM

## 2021-12-28 DIAGNOSIS — C786 Secondary malignant neoplasm of retroperitoneum and peritoneum: Secondary | ICD-10-CM | POA: Diagnosis not present

## 2021-12-28 DIAGNOSIS — E876 Hypokalemia: Secondary | ICD-10-CM | POA: Insufficient documentation

## 2021-12-28 DIAGNOSIS — C50919 Malignant neoplasm of unspecified site of unspecified female breast: Secondary | ICD-10-CM | POA: Insufficient documentation

## 2021-12-28 DIAGNOSIS — F17210 Nicotine dependence, cigarettes, uncomplicated: Secondary | ICD-10-CM | POA: Diagnosis not present

## 2021-12-28 DIAGNOSIS — M2550 Pain in unspecified joint: Secondary | ICD-10-CM | POA: Diagnosis not present

## 2021-12-28 DIAGNOSIS — G893 Neoplasm related pain (acute) (chronic): Secondary | ICD-10-CM | POA: Insufficient documentation

## 2021-12-28 DIAGNOSIS — M791 Myalgia, unspecified site: Secondary | ICD-10-CM | POA: Insufficient documentation

## 2021-12-28 LAB — CBC WITH DIFFERENTIAL/PLATELET
Abs Immature Granulocytes: 0.05 10*3/uL (ref 0.00–0.07)
Basophils Absolute: 0.1 10*3/uL (ref 0.0–0.1)
Basophils Relative: 1 %
Eosinophils Absolute: 0 10*3/uL (ref 0.0–0.5)
Eosinophils Relative: 0 %
HCT: 37.7 % (ref 36.0–46.0)
Hemoglobin: 12.5 g/dL (ref 12.0–15.0)
Immature Granulocytes: 1 %
Lymphocytes Relative: 24 %
Lymphs Abs: 2.4 10*3/uL (ref 0.7–4.0)
MCH: 31.3 pg (ref 26.0–34.0)
MCHC: 33.2 g/dL (ref 30.0–36.0)
MCV: 94.3 fL (ref 80.0–100.0)
Monocytes Absolute: 0.7 10*3/uL (ref 0.1–1.0)
Monocytes Relative: 7 %
Neutro Abs: 7 10*3/uL (ref 1.7–7.7)
Neutrophils Relative %: 67 %
Platelets: 446 10*3/uL — ABNORMAL HIGH (ref 150–400)
RBC: 4 MIL/uL (ref 3.87–5.11)
RDW: 12.9 % (ref 11.5–15.5)
WBC: 10.3 10*3/uL (ref 4.0–10.5)
nRBC: 0 % (ref 0.0–0.2)

## 2021-12-28 LAB — COMPREHENSIVE METABOLIC PANEL
ALT: 98 U/L — ABNORMAL HIGH (ref 0–44)
AST: 39 U/L (ref 15–41)
Albumin: 3.7 g/dL (ref 3.5–5.0)
Alkaline Phosphatase: 73 U/L (ref 38–126)
Anion gap: 12 (ref 5–15)
BUN: 35 mg/dL — ABNORMAL HIGH (ref 8–23)
CO2: 24 mmol/L (ref 22–32)
Calcium: 9.6 mg/dL (ref 8.9–10.3)
Chloride: 101 mmol/L (ref 98–111)
Creatinine, Ser: 0.79 mg/dL (ref 0.44–1.00)
GFR, Estimated: >60 mL/min (ref >60)
Glucose, Bld: 120 mg/dL — ABNORMAL HIGH (ref 70–99)
Potassium: 3.8 mmol/L (ref 3.5–5.1)
Sodium: 137 mmol/L (ref 135–145)
Total Bilirubin: 1.7 mg/dL — ABNORMAL HIGH (ref 0.3–1.2)
Total Protein: 7.4 g/dL (ref 6.5–8.1)

## 2021-12-28 MED ORDER — HEPARIN SOD (PORK) LOCK FLUSH 100 UNIT/ML IV SOLN
500.0000 [IU] | Freq: Once | INTRAVENOUS | Status: AC
  Administered 2021-12-28: 500 [IU] via INTRAVENOUS

## 2021-12-28 MED ORDER — SODIUM CHLORIDE 0.9 % IV SOLN: INTRAVENOUS | Status: DC

## 2021-12-28 MED ORDER — SODIUM CHLORIDE 0.9% FLUSH
10.0000 mL | Freq: Once | INTRAVENOUS | Status: AC
  Administered 2021-12-28: 10 mL via INTRAVENOUS

## 2021-12-28 MED ORDER — OXYCODONE-ACETAMINOPHEN 10-325 MG PO TABS: 1.0000 | ORAL_TABLET | ORAL | 0 refills | Status: DC | PRN

## 2021-12-28 MED ORDER — NYSTATIN 100000 UNIT/ML MT SUSP: 5.0000 mL | Freq: Four times a day (QID) | OROMUCOSAL | 0 refills | Status: DC

## 2021-12-28 MED ORDER — OXYCODONE-ACETAMINOPHEN 5-325 MG PO TABS
2.0000 | ORAL_TABLET | Freq: Once | ORAL | Status: AC
  Administered 2021-12-28: 2 via ORAL
  Filled 2021-12-28: qty 2

## 2021-12-28 MED ORDER — LACTULOSE 20 GM/30ML PO SOLN: 30.0000 mL | Freq: Three times a day (TID) | ORAL | 0 refills | Status: DC | PRN

## 2021-12-28 MED ORDER — BISACODYL 10 MG RE SUPP: 10.0000 mg | RECTAL | 0 refills | Status: AC | PRN

## 2021-12-28 NOTE — Progress Notes (Signed)
Patient presents today for 1 wk follow-up visit with Tarri Abernethy, PA. RN received orders for 1 L of NS over 2 hours as well as oxycodone 10mg  for pain. Orders administered. Patient tolerated hydration therapy with no complaints voiced. Side effects with management reviewed with understanding verbalized. Port site clean and dry with no bruising or swelling noted at site. Good blood return noted before and after administration of therapy. Band aid applied. Patient left in satisfactory condition with VSS and no s/s of distress noted.

## 2021-12-28 NOTE — Patient Instructions (Signed)
[facility]  Discharge Instructions: Thank you for choosing [facility] to provide your oncology and hematology care.  If you have a lab appointment with the [facility], please come in thru the Main Entrance and check in at the main information desk.  Wear comfortable clothing and clothing appropriate for easy access to any Portacath or PICC line.   We strive to give you quality time with your provider. You may need to reschedule your appointment if you arrive late (15 or more minutes).  Arriving late affects you and other patients whose appointments are after yours.  Also, if you miss three or more appointments without notifying the office, you may be dismissed from the clinic at the providers discretion.      For prescription refill requests, have your pharmacy contact our office and allow 72 hours for refills to be completed.    Today you received the following 1 liter of normal saline and 10 mg of oxycodone, return as scheduled.  To help prevent nausea and vomiting after your treatment, we encourage you to take your nausea medication as directed.  BELOW ARE SYMPTOMS THAT SHOULD BE REPORTED IMMEDIATELY: *FEVER GREATER THAN 100.4 F (38 C) OR HIGHER *CHILLS OR SWEATING *NAUSEA AND VOMITING THAT IS NOT CONTROLLED WITH YOUR NAUSEA MEDICATION *UNUSUAL SHORTNESS OF BREATH *UNUSUAL BRUISING OR BLEEDING *URINARY PROBLEMS (pain or burning when urinating, or frequent urination) *BOWEL PROBLEMS (unusual diarrhea, constipation, pain near the anus) TENDERNESS IN MOUTH AND THROAT WITH OR WITHOUT PRESENCE OF ULCERS (sore throat, sores in mouth, or a toothache) UNUSUAL RASH, SWELLING OR PAIN  UNUSUAL VAGINAL DISCHARGE OR ITCHING   Items with * indicate a potential emergency and should be followed up as soon as possible or go to the Emergency Department if any problems should occur.  Please show the CHEMOTHERAPY ALERT CARD or IMMUNOTHERAPY ALERT CARD at check-in to the  Emergency Department and triage nurse.  Should you have questions after your visit or need to cancel or reschedule your appointment, please contact [facility] [phone]  and follow the prompts.  Office hours are 8:00 a.m. to 4:30 p.m. Monday - Friday. Please note that voicemails left after 4:00 p.m. may not be returned until the following business day.  We are closed weekends and major holidays. You have access to a nurse at all times for urgent questions. Please call the main number to the clinic [phone] and follow the prompts.  For any non-urgent questions, you may also contact your provider using MyChart. We now offer e-Visits for anyone 85 and older to request care online for non-urgent symptoms. For details visit [email or web address].   Also download the MyChart app! Go to the app store, search "MyChart", open the app, select [facility], and log in with your MyChart username and password.  Due to Covid, a mask is required upon entering the hospital/clinic. If you do not have a mask, one will be given to you upon arrival. For doctor visits, patients may have 1 support person aged 22 or older with them. For treatment visits, patients cannot have anyone with them due to current Covid guidelines and our immunocompromised population.

## 2021-12-28 NOTE — Patient Instructions (Addendum)
[facility] at [facility] Discharge Instructions  You were seen today by Tarri Abernethy PA-C for symptom management visit.  CONSTIPATION: - TODAY: Take lactulose 30 mL x 1 dose.  Take Dulcolax suppository x1 dose. - If no bowel movement before tomorrow, take another dose of lactulose in the morning.  You can repeat a third dose of lactulose tomorrow night if no bowel movement. - If you have not had a bowel movement by Thursday, please call our office. - Once you have had a bowel movement, start taking Colace 2 tablets nightly. - Take lactulose as needed if you have not had a bowel movement in more than 1 day. - If you start having diarrhea, you should stop taking all stool softeners.  PAIN: Since your hydrocodone/acetaminophen is not helping with your pain, we will switch you to oxycodone. - You can take oxycodone 10/325 mg 1 tablet every 4 hours for pain. - Please call our office for additional pain management advice if your pain is not improved.  NUTRITION: - You are continuing to lose weight.  It is important that you maintain adequate nutrition so that you can continue your cancer treatment. - Try to eat solid food throughout the day, as you are able. - Drink 5 Ensure beverages per day.  THRUSH: - Take nystatin oral liquid as prescribed.  HYDRATION: - Drink 64 ounces of water daily - You were given IV fluids during your visit today. - We will schedule you for IV fluids again on Friday, 12/31/2021 and Wednesday, 01/05/2022  FOLLOW-UP APPOINTMENT: - Your next scheduled appointment with Dr. Delton Coombes is on 01/11/2022. - However, if your symptoms are not improved or are getting worse, please call our office to schedule another symptom management visit.   Thank you for choosing [facility] at [facility] to provide your oncology and hematology care.  To afford each patient quality time with our provider, please arrive at least 15  minutes before your scheduled appointment time.   If you have a lab appointment with the [facility] please come in thru the Main Entrance and check in at the main information desk.  You need to re-schedule your appointment should you arrive 10 or more minutes late.  We strive to give you quality time with our providers, and arriving late affects you and other patients whose appointments are after yours.  Also, if you no show three or more times for appointments you may be dismissed from the clinic at the providers discretion.     Again, thank you for choosing [facility].  Our hope is that these requests will decrease the amount of time that you wait before being seen by our physicians.       _____________________________________________________________  Should you have questions after your visit to [facility], please contact our office at [phone] and follow the prompts.  Our office hours are 8:00 a.m. and 4:30 p.m. Monday - Friday.  Please note that voicemails left after 4:00 p.m. may not be returned until the following business day.  We are closed weekends and major holidays.  You do have access to a nurse 24-7, just call the main number to the clinic [phone] and do not press any options, hold on the line and a nurse will answer the phone.    For prescription refill requests, have your pharmacy contact our office and allow 72 hours.    Due to Covid, you will need  to wear a mask upon entering the hospital. If you do not have a mask, a mask will be given to you at the Main Entrance upon arrival. For doctor visits, patients may have 1 support person age 1 or older with them. For treatment visits, patients can not have anyone with them due to social distancing guidelines and our immunocompromised population.

## 2021-12-29 ENCOUNTER — Encounter (HOSPITAL_COMMUNITY): Payer: Self-pay

## 2021-12-30 ENCOUNTER — Encounter (HOSPITAL_COMMUNITY): Payer: Self-pay

## 2021-12-30 NOTE — Progress Notes (Unsigned)
Cardiology Office Note    Date:  12/30/2021   ID:  Elizabeth Wu, DOB 1951-09-14, MRN [id number]   PCP:  Gottschalk, Ashly M, [place]  Cardiologist:  None *** Advanced Practice Provider:  No care team member to display Electrophysiologist:  None   [id number]}   No chief complaint on file.   History of Present Illness:  Elizabeth Wu is a 71 y.o. female with primary lung CA and 2 malignant breast masses was found to be in rapid atrial flutter while being assessed for port placement. CHADSVASc=3 but no anticoag started b/c of question brain mets on MRI. She was started on metoprolol 25 mg bid.  Patient saw Dr. Marlou Porch in f/u 12/09/21 and was in sinus tach 123/m with PAC's. Metoprolol increased 50 mg bid. Echo ordered.    Past Medical History:  Diagnosis Date   Allergy    Cancer ([place])    Breast   Complication of anesthesia    DJD (degenerative joint disease)    Hyperlipidemia    Hypertension    Osteopenia    PONV (postoperative nausea and vomiting)     Past Surgical History:  Procedure Laterality Date   APPENDECTOMY     BRONCHIAL BIOPSY  11/11/2021   Procedure: BRONCHIAL BIOPSIES;  Surgeon: Collene Gobble, MD;  Location: [facility];  Service: Pulmonary;;   BRONCHIAL BRUSHINGS  11/11/2021   Procedure: BRONCHIAL BRUSHINGS;  Surgeon: Collene Gobble, MD;  Location: [facility] ENDOSCOPY;  Service: Pulmonary;;   BRONCHIAL NEEDLE ASPIRATION BIOPSY  11/11/2021   Procedure: BRONCHIAL NEEDLE ASPIRATION BIOPSIES;  Surgeon: Collene Gobble, MD;  Location: [facility] ENDOSCOPY;  Service: Pulmonary;;   FIDUCIAL MARKER PLACEMENT  11/11/2021   Procedure: FIDUCIAL MARKER PLACEMENT;  Surgeon: Collene Gobble, MD;  Location: [facility] ENDOSCOPY;  Service: Pulmonary;;   HERNIA REPAIR     PORTACATH PLACEMENT Left 12/10/2021   Procedure: INSERTION PORT-A-CATH;  Surgeon: Virl Cagey, MD;  Location: [facility];  Service: General;  Laterality: Left;   TONSILLECTOMY      TONSILLECTOMY      Current Medications: No outpatient medications have been marked as taking for the 01/10/22 encounter (Appointment) with Imogene Burn, PA-C.     Allergies:   Patient has no known allergies.   Social History   Socioeconomic History   Marital status: Single    Spouse name: Not on file   Number of children: Not on file   Years of education: Not on file   Highest education level: 12th grade  Occupational History   Occupation: [employer]  Tobacco Use   Smoking status: Every Day    Packs/day: 0.30    Years: 55.00    Pack years: 16.50    Types: Cigarettes   Smokeless tobacco: Never  Vaping Use   Vaping Use: Never used  Substance and Sexual Activity   Alcohol use: No   Drug use: No   Sexual activity: Not on file  Other Topics Concern   Not on file  Social History Narrative   Not on file   Social Determinants of Health   Financial Resource Strain: Not on file  Food Insecurity: Not on file  Transportation Needs: Not on file  Physical Activity: Not on file  Stress: Not on file  Social Connections: Not on file     Family History:  The patient's ***family history includes Alzheimer's disease in her mother; Cancer in her father; Diabetes in her sister; Heart  disease in her brother.   ROS:   Please see the history of present illness.    ROS All other systems reviewed and are negative.   PHYSICAL EXAM:   VS:  There were no vitals taken for this visit.  Physical Exam  GEN: Well nourished, well developed, in no acute distress  HEENT: normal  Neck: no JVD, carotid bruits, or masses Cardiac:RRR; no murmurs, rubs, or gallops  Respiratory:  clear to auscultation bilaterally, normal work of breathing GI: soft, nontender, nondistended, + BS Ext: without cyanosis, clubbing, or edema, Good distal pulses bilaterally MS: no deformity or atrophy  Skin: warm and dry, no rash Neuro:  Alert and Oriented x 3, Strength and sensation are intact Psych:  euthymic mood, full affect  Wt Readings from Last 3 Encounters:  12/21/21 118 lb (53.5 kg)  12/13/21 122 lb 14.4 oz (55.7 kg)  12/09/21 125 lb 6.4 oz (56.9 kg)      Studies/Labs Reviewed:   EKG:  EKG is*** ordered today.  The ekg ordered today demonstrates ***  Recent Labs: 12/06/2021: Magnesium 1.9 12/21/2021: TSH 1.774 12/28/2021: ALT 98; BUN 35; Creatinine, Ser 0.79; Hemoglobin 12.5; Platelets 446; Potassium 3.8; Sodium 137   Lipid Panel    Component Value Date/Time   CHOL 191 06/30/2021 1008   CHOL 266 (H) 05/31/2013 0927   TRIG 197 (H) 06/30/2021 1008   TRIG 177 (H) 05/31/2013 0927   HDL 48 06/30/2021 1008   HDL 42 05/31/2013 0927   CHOLHDL 4.0 06/30/2021 1008   LDLCALC 109 (H) 06/30/2021 1008   LDLCALC 189 (H) 05/31/2013 8891    Additional studies/ records that were reviewed today include:  ***   Risk Assessment/Calculations:   {Does this patient have ATRIAL FIBRILLATION?:[id number]}     ASSESSMENT:    No diagnosis found.   PLAN:  In order of problems listed above:  Atrial flutter converted to Sinus tachycardia no anticoag b/c of port placement and question of brain mets on MRI. On metoprolol 50 mg bid. Echo:  Lung CA/Breast CA with ? Brain mets.  Shared Decision Making/Informed Consent   {Are you ordering a CV Procedure (e.g. stress test, cath, DCCV, TEE, etc)?   Press F2        :[id number]}    Medication Adjustments/Labs and Tests Ordered: Current medicines are reviewed at length with the patient today.  Concerns regarding medicines are outlined above.  Medication changes, Labs and Tests ordered today are listed in the Patient Instructions below. There are no Patient Instructions on file for this visit.   Sumner Boast, PA-C  12/30/2021 7:51 AM    [facility] [address], [place], [place]  [address] Phone: [phone]; Fax: [phone]

## 2021-12-31 ENCOUNTER — Other Ambulatory Visit: Payer: Self-pay

## 2021-12-31 ENCOUNTER — Encounter (HOSPITAL_COMMUNITY): Payer: Self-pay

## 2021-12-31 ENCOUNTER — Inpatient Hospital Stay (HOSPITAL_COMMUNITY): Payer: Medicare HMO

## 2021-12-31 ENCOUNTER — Encounter (HOSPITAL_COMMUNITY): Payer: Self-pay | Admitting: Hematology

## 2021-12-31 ENCOUNTER — Emergency Department (HOSPITAL_COMMUNITY): Payer: Medicare HMO

## 2021-12-31 ENCOUNTER — Observation Stay (HOSPITAL_COMMUNITY)
Admission: EM | Admit: 2021-12-31 | Discharge: 2022-01-01 | Disposition: A | Discharge status: Home Health | Payer: Medicare HMO | Attending: Internal Medicine | Admitting: Internal Medicine

## 2021-12-31 DIAGNOSIS — Z79899 Other long term (current) drug therapy: Secondary | ICD-10-CM | POA: Diagnosis not present

## 2021-12-31 DIAGNOSIS — I4891 Unspecified atrial fibrillation: Secondary | ICD-10-CM | POA: Diagnosis not present

## 2021-12-31 DIAGNOSIS — I2489 Other forms of acute ischemic heart disease: Secondary | ICD-10-CM | POA: Diagnosis present

## 2021-12-31 DIAGNOSIS — F17210 Nicotine dependence, cigarettes, uncomplicated: Secondary | ICD-10-CM | POA: Insufficient documentation

## 2021-12-31 DIAGNOSIS — R000 Tachycardia, unspecified: Secondary | ICD-10-CM | POA: Diagnosis not present

## 2021-12-31 DIAGNOSIS — R2689 Other abnormalities of gait and mobility: Secondary | ICD-10-CM | POA: Insufficient documentation

## 2021-12-31 DIAGNOSIS — Z20822 Contact with and (suspected) exposure to covid-19: Secondary | ICD-10-CM | POA: Diagnosis not present

## 2021-12-31 DIAGNOSIS — R002 Palpitations: Secondary | ICD-10-CM | POA: Diagnosis present

## 2021-12-31 DIAGNOSIS — R778 Other specified abnormalities of plasma proteins: Secondary | ICD-10-CM | POA: Diagnosis not present

## 2021-12-31 DIAGNOSIS — C50911 Malignant neoplasm of unspecified site of right female breast: Secondary | ICD-10-CM | POA: Diagnosis present

## 2021-12-31 DIAGNOSIS — E876 Hypokalemia: Secondary | ICD-10-CM | POA: Diagnosis not present

## 2021-12-31 DIAGNOSIS — R531 Weakness: Secondary | ICD-10-CM | POA: Diagnosis not present

## 2021-12-31 DIAGNOSIS — I10 Essential (primary) hypertension: Secondary | ICD-10-CM | POA: Insufficient documentation

## 2021-12-31 DIAGNOSIS — Z853 Personal history of malignant neoplasm of breast: Secondary | ICD-10-CM | POA: Insufficient documentation

## 2021-12-31 DIAGNOSIS — I4892 Unspecified atrial flutter: Secondary | ICD-10-CM | POA: Diagnosis not present

## 2021-12-31 DIAGNOSIS — E785 Hyperlipidemia, unspecified: Secondary | ICD-10-CM | POA: Diagnosis present

## 2021-12-31 DIAGNOSIS — I248 Other forms of acute ischemic heart disease: Secondary | ICD-10-CM | POA: Diagnosis not present

## 2021-12-31 DIAGNOSIS — C3490 Malignant neoplasm of unspecified part of unspecified bronchus or lung: Secondary | ICD-10-CM | POA: Diagnosis present

## 2021-12-31 DIAGNOSIS — C50912 Malignant neoplasm of unspecified site of left female breast: Secondary | ICD-10-CM | POA: Diagnosis present

## 2021-12-31 LAB — CBC WITH DIFFERENTIAL/PLATELET
Abs Immature Granulocytes: 0.04 10*3/uL (ref 0.00–0.07)
Basophils Absolute: 0 10*3/uL (ref 0.0–0.1)
Basophils Relative: 0 %
Eosinophils Absolute: 0 10*3/uL (ref 0.0–0.5)
Eosinophils Relative: 0 %
HCT: 36 % (ref 36.0–46.0)
Hemoglobin: 12.3 g/dL (ref 12.0–15.0)
Immature Granulocytes: 0 %
Lymphocytes Relative: 16 %
Lymphs Abs: 2 10*3/uL (ref 0.7–4.0)
MCH: 31.6 pg (ref 26.0–34.0)
MCHC: 34.2 g/dL (ref 30.0–36.0)
MCV: 92.5 fL (ref 80.0–100.0)
Monocytes Absolute: 0.9 10*3/uL (ref 0.1–1.0)
Monocytes Relative: 7 %
Neutro Abs: 9.7 10*3/uL — ABNORMAL HIGH (ref 1.7–7.7)
Neutrophils Relative %: 77 %
Platelets: 433 10*3/uL — ABNORMAL HIGH (ref 150–400)
RBC: 3.89 MIL/uL (ref 3.87–5.11)
RDW: 13 % (ref 11.5–15.5)
WBC: 12.7 10*3/uL — ABNORMAL HIGH (ref 4.0–10.5)
nRBC: 0 % (ref 0.0–0.2)

## 2021-12-31 LAB — COMPREHENSIVE METABOLIC PANEL
ALT: 131 U/L — ABNORMAL HIGH (ref 0–44)
AST: 73 U/L — ABNORMAL HIGH (ref 15–41)
Albumin: 3.2 g/dL — ABNORMAL LOW (ref 3.5–5.0)
Alkaline Phosphatase: 60 U/L (ref 38–126)
Anion gap: 11 (ref 5–15)
BUN: 34 mg/dL — ABNORMAL HIGH (ref 8–23)
CO2: 21 mmol/L — ABNORMAL LOW (ref 22–32)
Calcium: 9.1 mg/dL (ref 8.9–10.3)
Chloride: 103 mmol/L (ref 98–111)
Creatinine, Ser: 0.88 mg/dL (ref 0.44–1.00)
GFR, Estimated: >60 mL/min (ref >60)
Glucose, Bld: 174 mg/dL — ABNORMAL HIGH (ref 70–99)
Potassium: 2.8 mmol/L — ABNORMAL LOW (ref 3.5–5.1)
Sodium: 135 mmol/L (ref 135–145)
Total Bilirubin: 1.9 mg/dL — ABNORMAL HIGH (ref 0.3–1.2)
Total Protein: 6.3 g/dL — ABNORMAL LOW (ref 6.5–8.1)

## 2021-12-31 LAB — TROPONIN I (HIGH SENSITIVITY)
Troponin I (High Sensitivity): 138 ng/L (ref <18)
Troponin I (High Sensitivity): 124 ng/L (ref <18)

## 2021-12-31 LAB — RESP PANEL BY RT-PCR (FLU A&B, COVID) ARPGX2
Influenza A by PCR: NEGATIVE
Influenza B by PCR: NEGATIVE
SARS Coronavirus 2 by RT PCR: NEGATIVE

## 2021-12-31 LAB — MAGNESIUM: Magnesium: 1.7 mg/dL (ref 1.7–2.4)

## 2021-12-31 IMAGING — DX DG CHEST 1V PORT
1 series · 1 of 1 positions shown · non-contrast
Comparison: Radiographs [DATE] and [DATE].  CT [DATE].

CLINICAL DATA: Tachycardia with weakness.  History of hypertension.

EXAM:
PORTABLE CHEST 1 VIEW

[chest ap]
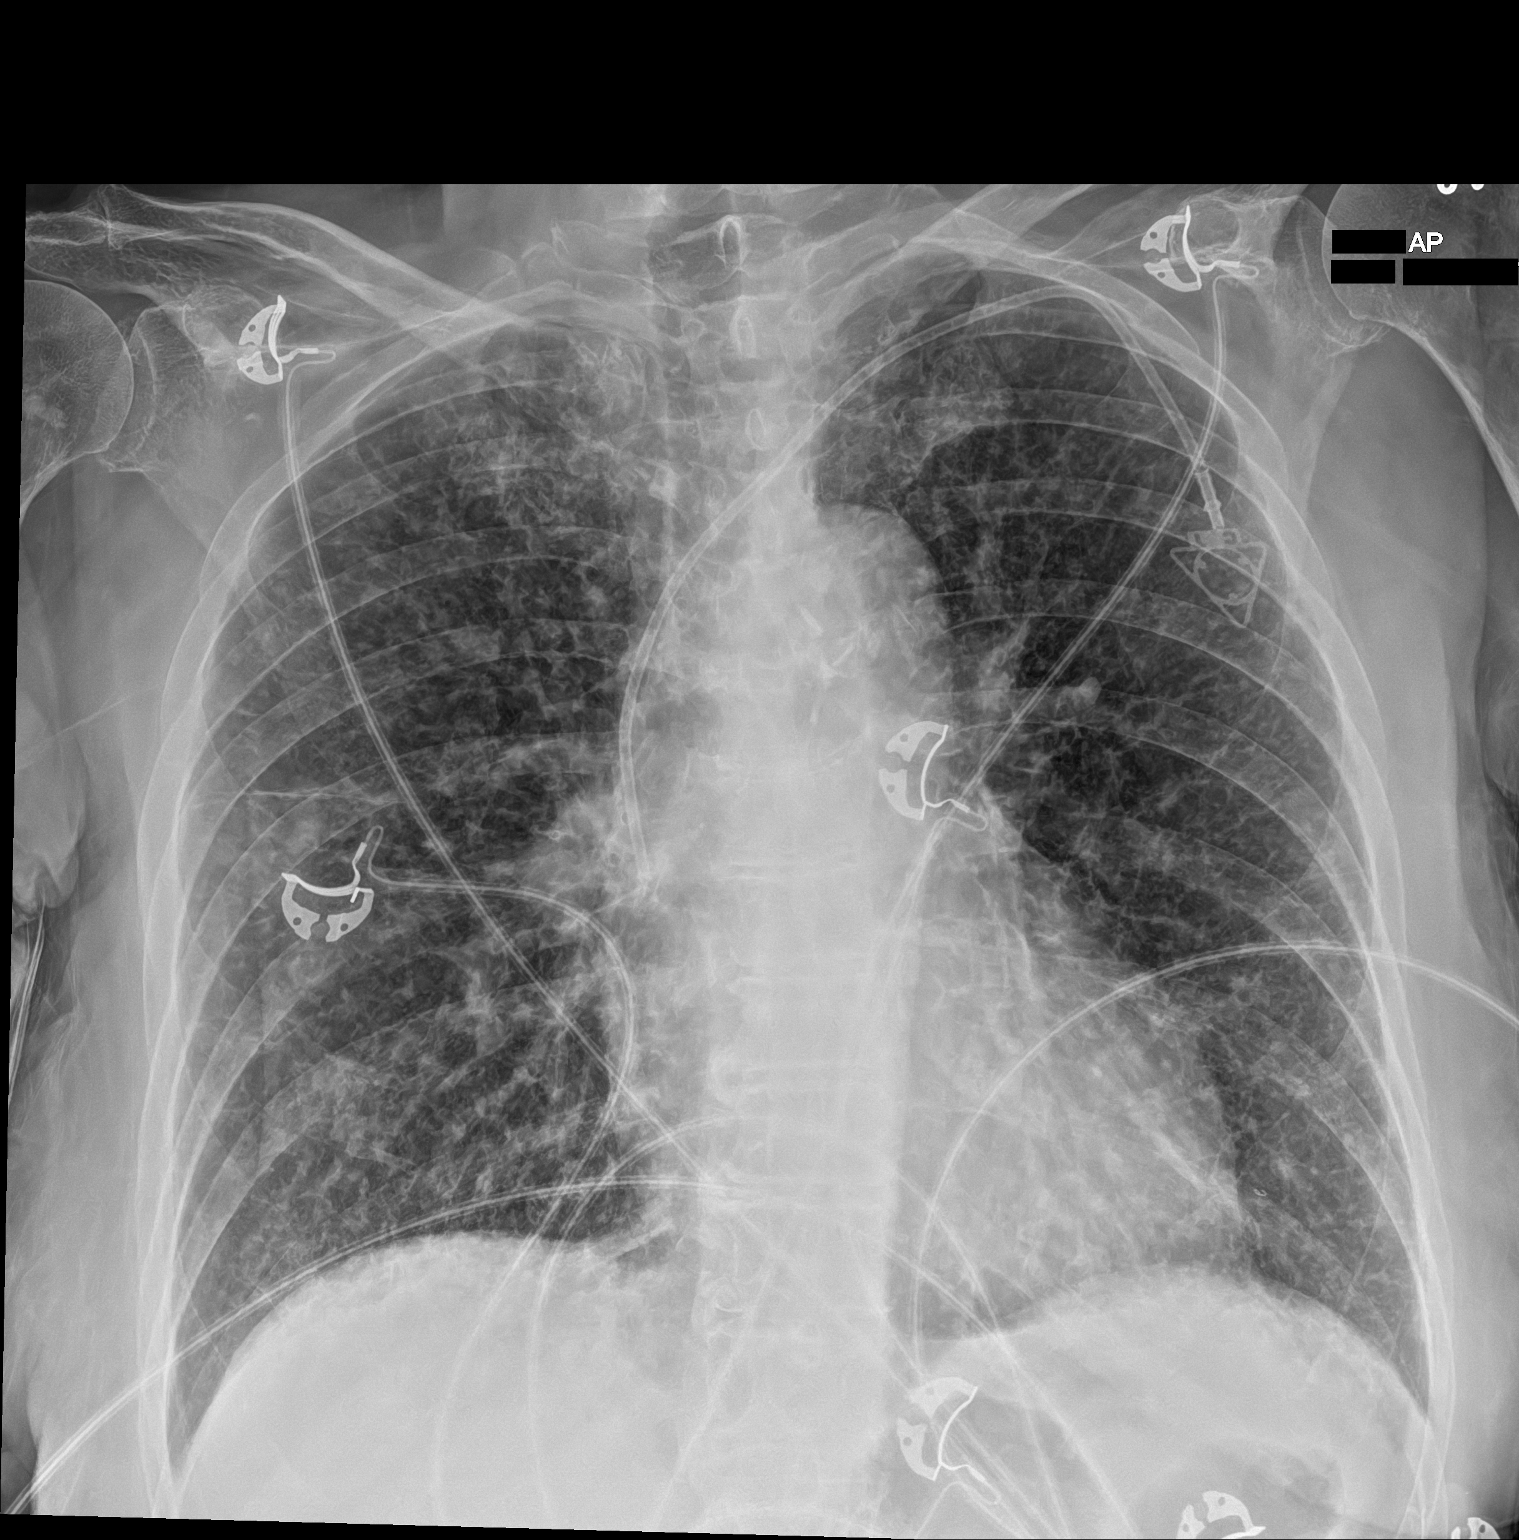

[1 of 1 positions shown; findings below may reference images not displayed]

FINDINGS: [WQ] hours. Left subclavian Port-A-Cath tip projects over the lower
SVC, unchanged. The heart size and mediastinal contours are stable.
There is aortic atherosclerosis. Extensive reticulonodular densities
throughout both lungs are grossly stable. The dominant cavitary
lesion in the right lower lobe on CT projects over the right hilum
and is not well visualized. No superimposed airspace disease,
pleural effusion or pneumothorax identified. The bones appear
unchanged. Telemetry leads overlie the chest.
IMPRESSION: Grossly stable radiographic appearance of the chest with diffuse
reticulonodular densities corresponding with probable metastatic
disease superimposed on emphysema. No apparent acute superimposed
process.

## 2021-12-31 MED ORDER — POTASSIUM CHLORIDE 10 MEQ/100ML IV SOLN
INTRAVENOUS | Status: AC
  Administered 2021-12-31: 10 meq via INTRAVENOUS
  Filled 2021-12-31: qty 100

## 2021-12-31 MED ORDER — AMIODARONE HCL 200 MG PO TABS: 200.0000 mg | ORAL_TABLET | Freq: Two times a day (BID) | ORAL | Status: DC

## 2021-12-31 MED ORDER — AMIODARONE HCL IN DEXTROSE 360-4.14 MG/200ML-% IV SOLN: 30.0000 mg/h | INTRAVENOUS | Status: DC

## 2021-12-31 MED ORDER — OXYCODONE-ACETAMINOPHEN 5-325 MG PO TABS
1.0000 | ORAL_TABLET | Freq: Once | ORAL | Status: AC
  Administered 2021-12-31: 1 via ORAL
  Filled 2021-12-31: qty 1

## 2021-12-31 MED ORDER — SODIUM CHLORIDE 0.9 % IV BOLUS
1000.0000 mL | Freq: Once | INTRAVENOUS | Status: AC
  Administered 2021-12-31: 1000 mL via INTRAVENOUS

## 2021-12-31 MED ORDER — ONDANSETRON HCL 4 MG/2ML IJ SOLN
4.0000 mg | Freq: Four times a day (QID) | INTRAMUSCULAR | Status: DC | PRN
  Administered 2021-12-31: 4 mg via INTRAVENOUS
  Filled 2021-12-31: qty 2

## 2021-12-31 MED ORDER — DILTIAZEM HCL 25 MG/5ML IV SOLN
10.0000 mg | Freq: Once | INTRAVENOUS | Status: AC
  Administered 2021-12-31: 10 mg via INTRAVENOUS
  Filled 2021-12-31: qty 5

## 2021-12-31 MED ORDER — DILTIAZEM HCL-DEXTROSE 125-5 MG/125ML-% IV SOLN (PREMIX)
5.0000 mg/h | INTRAVENOUS | Status: DC
  Administered 2021-12-31: 5 mg/h via INTRAVENOUS
  Filled 2021-12-31: qty 125

## 2021-12-31 MED ORDER — POTASSIUM CHLORIDE 10 MEQ/100ML IV SOLN
10.0000 meq | Freq: Once | INTRAVENOUS | Status: AC
  Filled 2021-12-31: qty 100

## 2021-12-31 MED ORDER — AMIODARONE HCL IN DEXTROSE 360-4.14 MG/200ML-% IV SOLN
60.0000 mg/h | INTRAVENOUS | Status: DC
  Administered 2021-12-31: 60 mg/h via INTRAVENOUS
  Filled 2021-12-31: qty 200

## 2021-12-31 MED ORDER — POTASSIUM CHLORIDE 10 MEQ/100ML IV SOLN
10.0000 meq | Freq: Once | INTRAVENOUS | Status: AC
Start: 2021-12-31 — End: 2021-12-31
  Administered 2021-12-31: 10 meq via INTRAVENOUS

## 2021-12-31 MED ORDER — ENOXAPARIN SODIUM 40 MG/0.4ML IJ SOSY
40.0000 mg | PREFILLED_SYRINGE | INTRAMUSCULAR | Status: DC
  Administered 2021-12-31: 40 mg via SUBCUTANEOUS
  Filled 2021-12-31: qty 0.4

## 2021-12-31 MED ORDER — MEGESTROL ACETATE 400 MG/10ML PO SUSP
400.0000 mg | Freq: Two times a day (BID) | ORAL | Status: DC
  Administered 2021-12-31 – 2022-01-01 (×2): 400 mg via ORAL
  Filled 2021-12-31 (×2): qty 10

## 2021-12-31 MED ORDER — OXYCODONE HCL 5 MG PO TABS
10.0000 mg | ORAL_TABLET | Freq: Four times a day (QID) | ORAL | Status: DC | PRN
  Administered 2021-12-31 – 2022-01-01 (×3): 10 mg via ORAL
  Filled 2021-12-31 (×3): qty 2

## 2021-12-31 MED ORDER — AMIODARONE HCL 200 MG PO TABS
200.0000 mg | ORAL_TABLET | Freq: Once | ORAL | Status: AC
  Administered 2021-12-31: 200 mg via ORAL
  Filled 2021-12-31: qty 1

## 2021-12-31 MED ORDER — AMIODARONE LOAD VIA INFUSION
150.0000 mg | Freq: Once | INTRAVENOUS | Status: AC
  Administered 2021-12-31: 150 mg via INTRAVENOUS
  Filled 2021-12-31: qty 83.34

## 2021-12-31 MED ORDER — AMIODARONE HCL 200 MG PO TABS
200.0000 mg | ORAL_TABLET | Freq: Two times a day (BID) | ORAL | Status: DC
  Administered 2022-01-01: 200 mg via ORAL
  Filled 2021-12-31: qty 1

## 2021-12-31 MED ORDER — ANASTROZOLE 1 MG PO TABS
1.0000 mg | ORAL_TABLET | Freq: Every day | ORAL | Status: DC
  Administered 2022-01-01: 1 mg via ORAL
  Filled 2021-12-31 (×3): qty 1

## 2021-12-31 MED ORDER — POTASSIUM CHLORIDE CRYS ER 20 MEQ PO TBCR
40.0000 meq | EXTENDED_RELEASE_TABLET | Freq: Once | ORAL | Status: AC
Start: 2021-12-31 — End: 2021-12-31
  Administered 2021-12-31: 40 meq via ORAL
  Filled 2021-12-31: qty 2

## 2021-12-31 MED ORDER — POTASSIUM CHLORIDE 10 MEQ/100ML IV SOLN
10.0000 meq | Freq: Once | INTRAVENOUS | Status: AC
  Administered 2021-12-31: 10 meq via INTRAVENOUS

## 2021-12-31 NOTE — Assessment & Plan Note (Addendum)
Statin on hold due to elevated LFT

## 2021-12-31 NOTE — ED Notes (Signed)
Dr Roderic Palau at bedside for orders and assessment upon pt reaching the room.

## 2021-12-31 NOTE — ED Provider Notes (Signed)
[facility] Provider Note   CSN: [id number] Arrival date & time: 12/31/21  7902     History  Chief Complaint  Patient presents with   Tachycardia    Elizabeth Wu is a 71 y.o. female.  Patient was sent over from the cancer center for palpitations.  Patient has a history of lung cancer  The history is provided by the patient and medical records. No language interpreter was used.  Palpitations Palpitations quality:  Irregular Onset quality:  Sudden Timing:  Constant Progression:  Worsening Chronicity:  Recurrent Context: not anxiety   Relieved by:  Nothing Worsened by:  Nothing Associated symptoms: no back pain, no chest pain and no cough       Home Medications Prior to Admission medications   Medication Sig Start Date End Date Taking? Authorizing Provider  anastrozole (ARIMIDEX) 1 MG tablet Take 1 tablet (1 mg total) by mouth daily. 11/18/21  Yes Derek Jack, MD  bisacodyl (DULCOLAX) 10 MG suppository Place 1 suppository (10 mg total) rectally as needed for moderate constipation. 12/28/21  Yes Pennington, Rebekah M, PA-C  Cholecalciferol (VITAMIN D) 50 MCG (2000 UT) tablet Take 2,000 Units by mouth daily.   Yes [provider]  cyanocobalamin (,VITAMIN B-12,) 1000 MCG/ML injection Inject 1,000 mcg into the muscle every 30 (thirty) days.   Yes [provider]  docusate sodium (COLACE) 100 MG capsule Take 100 mg by mouth 2 (two) times daily.   Yes [provider]  Ensure (ENSURE) Take 237 mLs by mouth 2 (two) times daily between meals.   Yes [provider]  folic acid (FOLVITE) 1 MG tablet Take 1 mg by mouth daily.   Yes [provider]  megestrol (MEGACE) 400 MG/10ML suspension Take 10 mLs (400 mg total) by mouth 2 (two) times daily. 11/18/21  Yes Derek Jack, MD  metoprolol tartrate (LOPRESSOR) 50 MG tablet Take 1 tablet (50 mg total) by mouth 2 (two) times daily. 12/09/21 03/09/22 Yes Jerline Pain, MD  nystatin (MYCOSTATIN) 100000 UNIT/ML suspension Take 5 mLs (500,000 Units total) by mouth 4 (four) times daily for 3 days. 12/28/21 12/31/21 Yes Pennington, Collier Bullock, PA-C  Omega-3 Fatty Acids (FISH OIL) 1000 MG CAPS Take 1,000 mg by mouth daily.   Yes [provider]  Oxycodone HCl 10 MG TABS Take 1 tablet (10 mg total) by mouth every 6 (six) hours as needed. 12/02/21  Yes Derek Jack, MD  prochlorperazine (COMPAZINE) 10 MG tablet Take 1 tablet (10 mg total) by mouth every 6 (six) hours as needed for nausea or vomiting. 10/25/21  Yes Derek Jack, MD  simvastatin (ZOCOR) 20 MG tablet Take 1 tablet (20 mg total) by mouth at bedtime. 11/30/20  Yes Gottschalk, Leatrice Jewels M, DO  fluconazole (DIFLUCAN) 100 MG tablet Take 1 tablet (100 mg total) by mouth daily. Take two tabs on day one followed by one tablet daily. Patient not taking: Reported on 12/31/2021 11/18/21   Derek Jack, MD  ibuprofen (ADVIL) 200 MG tablet Take 400 mg by mouth every 6 (six) hours as needed for moderate pain. Patient not taking: Reported on 12/31/2021    [provider]  Lactulose 20 GM/30ML SOLN Take 30 mLs (20 g total) by mouth 3 (three) times daily as needed (Consipation). Patient not taking: Reported on 12/31/2021 12/28/21   Harriett Rush, PA-C  Misc. Devices MISC Please provide with wheelchair; dx metastatic lung cancer 12/13/21   Derek Jack, MD  oxyCODONE-acetaminophen (PERCOCET) 10-325 MG  tablet Take 1 tablet by mouth every 4 (four) hours as needed for pain. Patient not taking: Reported on 12/31/2021 12/28/21   Harriett Rush, PA-C      Allergies    Patient has no known allergies.    Review of Systems   Review of Systems  Constitutional:  Negative for appetite change and fatigue.  HENT:  Negative for congestion, ear discharge and sinus pressure.   Eyes:  Negative for discharge.  Respiratory:  Negative for cough.   Cardiovascular:  Positive for  palpitations. Negative for chest pain.  Gastrointestinal:  Negative for abdominal pain and diarrhea.  Genitourinary:  Negative for frequency and hematuria.  Musculoskeletal:  Negative for back pain.  Skin:  Negative for rash.  Neurological:  Negative for seizures and headaches.  Psychiatric/Behavioral:  Negative for hallucinations.    Physical Exam Updated Vital Signs BP 98/60    Pulse 87    Temp 97.9 F (36.6 C) (Oral)    Resp 19    Ht 5\' 4"  (1.626 m)    Wt 53.5 kg    SpO2 92%    BMI 20.25 kg/m  Physical Exam Vitals and nursing note reviewed.  Constitutional:      Appearance: She is well-developed.  HENT:     Head: Normocephalic.     Mouth/Throat:     Mouth: Mucous membranes are moist.  Eyes:     General: No scleral icterus.    Conjunctiva/sclera: Conjunctivae normal.  Neck:     Thyroid: No thyromegaly.  Cardiovascular:     Rate and Rhythm: Tachycardia present. Rhythm irregular.     Heart sounds: No murmur heard.   No friction rub. No gallop.  Pulmonary:     Breath sounds: No stridor. No wheezing or rales.  Chest:     Chest wall: No tenderness.  Abdominal:     General: There is no distension.     Tenderness: There is no abdominal tenderness. There is no rebound.  Musculoskeletal:        General: Normal range of motion.     Cervical back: Neck supple.  Lymphadenopathy:     Cervical: No cervical adenopathy.  Skin:    Findings: No erythema or rash.  Neurological:     Mental Status: She is alert and oriented to person, place, and time.     Motor: No abnormal muscle tone.     Coordination: Coordination normal.  Psychiatric:        Behavior: Behavior normal.    ED Results / Procedures / Treatments   Labs (all labs ordered are listed, but only abnormal results are displayed) Labs Reviewed  CBC WITH DIFFERENTIAL/PLATELET - Abnormal; Notable for the following components:      Result Value   WBC 12.7 (*)    Platelets 433 (*)    Neutro Abs 9.7 (*)    All other  components within normal limits  COMPREHENSIVE METABOLIC PANEL - Abnormal; Notable for the following components:   Potassium 2.8 (*)    CO2 21 (*)    Glucose, Bld 174 (*)    BUN 34 (*)    Total Protein 6.3 (*)    Albumin 3.2 (*)    AST 73 (*)    ALT 131 (*)    Total Bilirubin 1.9 (*)    All other components within normal limits  TROPONIN I (HIGH SENSITIVITY) - Abnormal; Notable for the following components:   Troponin I (High Sensitivity) 138 (*)    All other components  within normal limits  TROPONIN I (HIGH SENSITIVITY) - Abnormal; Notable for the following components:   Troponin I (High Sensitivity) 124 (*)    All other components within normal limits  RESP PANEL BY RT-PCR (FLU A&B, COVID) [id number]    EKG EKG Interpretation  Date/Time:  Friday December 31 2021 09:42:37 EST Ventricular Rate:  200 PR Interval:    QRS Duration: 79 QT Interval:  214 QTC Calculation: 370 R Axis:   75 Text Interpretation: Atrial fibrillation with rapid V-rate Repolarization abnormality, prob rate related Confirmed by Milton Ferguson [phone]) on 12/31/2021 10:31:28 AM  Radiology DG Chest Port 1 View  Result Date: 12/31/2021 CLINICAL DATA:  Tachycardia with weakness.  History of hypertension. EXAM: PORTABLE CHEST 1 VIEW COMPARISON:  Radiographs 12/10/2021 and 12/06/2021.  CT 12/06/2021. FINDINGS: 1012 hours. Left subclavian Port-A-Cath tip projects over the lower SVC, unchanged. The heart size and mediastinal contours are stable. There is aortic atherosclerosis. Extensive reticulonodular densities throughout both lungs are grossly stable. The dominant cavitary lesion in the right lower lobe on CT projects over the right hilum and is not well visualized. No superimposed airspace disease, pleural effusion or pneumothorax identified. The bones appear unchanged. Telemetry leads overlie the chest. IMPRESSION: Grossly stable radiographic appearance of the chest with diffuse reticulonodular densities corresponding  with probable metastatic disease superimposed on emphysema. No apparent acute superimposed process. Electronically Signed   By: Richardean Sale M.D.   On: 12/31/2021 10:29    Procedures Procedures    Medications Ordered in ED Medications  potassium chloride 10 mEq in 100 mL IVPB (has no administration in time range)  potassium chloride 10 mEq in 100 mL IVPB (has no administration in time range)  potassium chloride 10 mEq in 100 mL IVPB (10 mEq Intravenous New Bag/Given 12/31/21 1256)  potassium chloride SA (KLOR-CON M) CR tablet 40 mEq (has no administration in time range)  sodium chloride 0.9 % bolus 1,000 mL (0 mLs Intravenous Stopped 12/31/21 1117)  diltiazem (CARDIZEM) injection 10 mg (10 mg Intravenous Given 12/31/21 1013)  sodium chloride 0.9 % bolus 1,000 mL (0 mLs Intravenous Stopped 12/31/21 1234)  potassium chloride 10 mEq in 100 mL IVPB (0 mEq Intravenous Stopped 12/31/21 1252)  amiodarone (NEXTERONE) 1.8 mg/mL load via infusion 150 mg (150 mg Intravenous Bolus from Bag 12/31/21 1243)  oxyCODONE-acetaminophen (PERCOCET/ROXICET) 5-325 MG per tablet 1 tablet (1 tablet Oral Given 12/31/21 1329)  amiodarone (PACERONE) tablet 200 mg (200 mg Oral Given 12/31/21 1329)   CRITICAL CARE Performed by: Milton Ferguson Total critical care time: 40 minutes Critical care time was exclusive of separately billable procedures and treating other patients. Critical care was necessary to treat or prevent imminent or life-threatening deterioration. Critical care was time spent personally by me on the following activities: development of treatment plan with patient and/or surrogate as well as nursing, discussions with consultants, evaluation of patient's response to treatment, examination of patient, obtaining history from patient or surrogate, ordering and performing treatments and interventions, ordering and review of laboratory studies, ordering and review of radiographic studies, pulse oximetry and  re-evaluation of patient's condition.  ED Course/ Medical Decision Making/ A&P                           Medical Decision Making Amount and/or Complexity of Data Reviewed Labs: ordered. Radiology: ordered.  Risk Prescription drug management. Decision regarding hospitalization.   Patient with atrial fib.  She converted on amiodarone.  She will be  admitted to the hospitalist for observation for hypokalemia and hypertension This patient presents to the ED for concern of irregular heartbeat, this involves an extensive number of treatment options, and is a complaint that carries with it a high risk of complications and morbidity.  The differential diagnosis includes atrial fib  Co morbidities that complicate the patient evaluation  Lung cancer   Additional history obtained:  Additional history obtained from patient External records from outside source obtained and reviewed including hospital records   Lab Tests:  I Ordered, and personally interpreted labs.  The pertinent results include: WBC elevated 12.7.  Troponin 124   Imaging Studies ordered:  I ordered imaging studies including chest x-ray I independently visualized and interpreted imaging which showed metastatic lung disease I agree with the radiologist interpretation   Cardiac Monitoring:  The patient was maintained on a cardiac monitor.  I personally viewed and interpreted the cardiac monitored which showed an underlying rhythm of: Rapid atrial fib   Medicines ordered and prescription drug management:  I ordered medication including Cardizem and amiodarone Reevaluation of the patient after these medicines showed that the patient improved I have reviewed the patients home medicines and have made adjustments as needed   Test Considered:  CT chest  Critical Interventions:  Starting IV amiodarone   Consultations Obtained:  I requested consultation with the cardiology, and hospitalist and discussed lab  and imaging findings as well as pertinent plan - they recommend: Cardiologist suggested amiodarone and hospitalist will admit   Problem List / ED Course:  Rapid atrial fib   Reevaluation:  After the interventions noted above, I reevaluated the patient and found that they have :improved   Social Determinants of Health:  Metastatic lung cancer   Dispostion:  After consideration of the diagnostic results and the patients response to treatment, I feel that the patent would benefit from admission to the hospital with rate control for atrial flutter with amiodarone.         Final Clinical Impression(s) / ED Diagnoses Final diagnoses:  Atrial fibrillation with rapid ventricular response [facility])    Rx / DC Orders ED Discharge Orders     None         Milton Ferguson, MD 01/03/22 1044

## 2021-12-31 NOTE — ED Triage Notes (Signed)
Pt came from Cancer center with HR 170

## 2021-12-31 NOTE — Assessment & Plan Note (Signed)
Troponin 138 >> 124 Not consistent with ACS Likely in setting of A Fib RVR

## 2021-12-31 NOTE — H&P (Signed)
History and Physical    Patient: Elizabeth STATON HER:[id number] DOB: [birth year] DOA: 12/31/2021 DOS: the patient was seen and examined on 12/31/2021 PCP: [name], DO  Patient coming from: Home  Chief Complaint:  Chief Complaint  Patient presents with   Tachycardia    HPI: Elizabeth Wu is a 71 y.o. female with medical history significant of bilateral breast cancer and metastatic lung cancer, recent history of A Fib RVR, HLD, who presents to the hospital with chief complaint of tachycardia.  Patient had gone to cancer center for IV fluids, however upon assessment patient was found to be short of breath with heart rate in the 170s.  Patient was immediately sent down to the ER. In the emergency department, she was found to be in a flutter with RVR.  She was given IV Cardizem but due to hypotension, cardiology was consulted.  She was started on IV amiodarone.  Patient referred for observation due to significant hypokalemia.  Patient states that she had had some exertional shortness of breath but no chest pain or palpitations.  No nausea or vomiting.   Review of Systems: As mentioned in the history of present illness. All other systems reviewed and are negative. Past Medical History:  Diagnosis Date   Allergy    Cancer ([place])    Breast   Complication of anesthesia    DJD (degenerative joint disease)    Hyperlipidemia    Hypertension    Osteopenia    PONV (postoperative nausea and vomiting)    Past Surgical History:  Procedure Laterality Date   APPENDECTOMY     BRONCHIAL BIOPSY  11/11/2021   Procedure: BRONCHIAL BIOPSIES;  Surgeon: Collene Gobble, MD;  Location: [facility];  Service: Pulmonary;;   BRONCHIAL BRUSHINGS  11/11/2021   Procedure: BRONCHIAL BRUSHINGS;  Surgeon: Collene Gobble, MD;  Location: [facility];  Service: Pulmonary;;   BRONCHIAL NEEDLE ASPIRATION BIOPSY  11/11/2021   Procedure: BRONCHIAL NEEDLE ASPIRATION BIOPSIES;  Surgeon: Collene Gobble, MD;   Location: [facility];  Service: Pulmonary;;   FIDUCIAL MARKER PLACEMENT  11/11/2021   Procedure: FIDUCIAL MARKER PLACEMENT;  Surgeon: Collene Gobble, MD;  Location: [facility];  Service: Pulmonary;;   HERNIA REPAIR     PORTACATH PLACEMENT Left 12/10/2021   Procedure: INSERTION PORT-A-CATH;  Surgeon: Virl Cagey, MD;  Location: [facility];  Service: General;  Laterality: Left;   TONSILLECTOMY     TONSILLECTOMY     Social History:  reports that she has been smoking cigarettes. She has a 16.50 pack-year smoking history. She has never used smokeless tobacco. She reports that she does not drink alcohol and does not use drugs.  No Known Allergies  Family History  Problem Relation Age of Onset   Alzheimer's disease Mother    Cancer Father        stomach cancer   Diabetes Sister    Heart disease Brother        MI    Prior to Admission medications   Medication Sig Start Date End Date Taking? Authorizing Provider  anastrozole (ARIMIDEX) 1 MG tablet Take 1 tablet (1 mg total) by mouth daily. 11/18/21  Yes Derek Jack, MD  bisacodyl (DULCOLAX) 10 MG suppository Place 1 suppository (10 mg total) rectally as needed for moderate constipation. 12/28/21  Yes Pennington, Rebekah M, PA-C  Cholecalciferol (VITAMIN D) 50 MCG (2000 UT) tablet Take 2,000 Units by mouth daily.   Yes [provider]  cyanocobalamin (,VITAMIN B-12,) 1000 MCG/ML  injection Inject 1,000 mcg into the muscle every 30 (thirty) days.   Yes [provider]  docusate sodium (COLACE) 100 MG capsule Take 100 mg by mouth 2 (two) times daily.   Yes [provider]  Ensure (ENSURE) Take 237 mLs by mouth 2 (two) times daily between meals.   Yes [provider]  folic acid (FOLVITE) 1 MG tablet Take 1 mg by mouth daily.   Yes [provider]  megestrol (MEGACE) 400 MG/10ML suspension Take 10 mLs (400 mg total) by mouth 2 (two) times daily. 11/18/21  Yes Derek Jack, MD   metoprolol tartrate (LOPRESSOR) 50 MG tablet Take 1 tablet (50 mg total) by mouth 2 (two) times daily. 12/09/21 03/09/22 Yes Jerline Pain, MD  nystatin (MYCOSTATIN) 100000 UNIT/ML suspension Take 5 mLs (500,000 Units total) by mouth 4 (four) times daily for 3 days. 12/28/21 12/31/21 Yes Pennington, Collier Bullock, PA-C  Omega-3 Fatty Acids (FISH OIL) 1000 MG CAPS Take 1,000 mg by mouth daily.   Yes [provider]  Oxycodone HCl 10 MG TABS Take 1 tablet (10 mg total) by mouth every 6 (six) hours as needed. 12/02/21  Yes Derek Jack, MD  prochlorperazine (COMPAZINE) 10 MG tablet Take 1 tablet (10 mg total) by mouth every 6 (six) hours as needed for nausea or vomiting. 10/25/21  Yes Derek Jack, MD  simvastatin (ZOCOR) 20 MG tablet Take 1 tablet (20 mg total) by mouth at bedtime. 11/30/20  Yes Gottschalk, Leatrice Jewels M, DO  fluconazole (DIFLUCAN) 100 MG tablet Take 1 tablet (100 mg total) by mouth daily. Take two tabs on day one followed by one tablet daily. Patient not taking: Reported on 12/31/2021 11/18/21   Derek Jack, MD  ibuprofen (ADVIL) 200 MG tablet Take 400 mg by mouth every 6 (six) hours as needed for moderate pain. Patient not taking: Reported on 12/31/2021    [provider]  Lactulose 20 GM/30ML SOLN Take 30 mLs (20 g total) by mouth 3 (three) times daily as needed (Consipation). Patient not taking: Reported on 12/31/2021 12/28/21   Harriett Rush, PA-C  Misc. Devices MISC Please provide with wheelchair; dx metastatic lung cancer 12/13/21   Derek Jack, MD  oxyCODONE-acetaminophen (PERCOCET) 10-325 MG tablet Take 1 tablet by mouth every 4 (four) hours as needed for pain. Patient not taking: Reported on 12/31/2021 12/28/21   Harriett Rush, PA-C    Physical Exam: Vitals:   12/31/21 1330 12/31/21 1400 12/31/21 1415 12/31/21 1430  BP: 98/60 99/66  99/66  Pulse: 87 91 85 78  Resp: 19 (!) 21 15 18   Temp:    98.9 F (37.2 C)  TempSrc:     Oral  SpO2: 92% 92% 93% 95%  Weight:      Height:        Examination: General exam: Appears calm and comfortable  Respiratory system: Clear to auscultation. Respiratory effort normal. Cardiovascular system: S1 & S2 heard, irregular rhythm, normal rate. No pedal edema.  On telemetry, patient is in normal sinus rhythm with PACs Gastrointestinal system: Abdomen is nondistended, soft and nontender. Normal bowel sounds heard. Central nervous system: Alert and oriented. Non focal exam. Speech clear  Extremities: Symmetric in appearance bilaterally  Skin: No rashes, lesions or ulcers on exposed skin  Psychiatry: Judgement and insight appear stable. Mood & affect appropriate.    Data Reviewed:  Potassium 2.8, AST 73, ALT 131, troponin 138, 124, WBC 12.7, influenza and COVID test negative.  Assessment and Plan: * Atrial  fibrillation with RVR ([place])- (present on admission) Evaluated by cardiology in the emergency department CHA2DS2-VASc score 4, however not a candidate for anticoagulation due to brain mets Loaded with IV amiodarone, transition to p.o. 200 mg twice daily Monitor on telemetry  Hypokalemia Replace, trend Check magnesium  Demand ischemia ([place])- (present on admission) Troponin 138 >> 124 Not consistent with ACS Likely in setting of A Fib RVR   Bilateral breast cancer ([place])- (present on admission) Followed by Dr. Delton Coombes  Metastatic primary lung cancer [facility])- (present on admission) Currently under treatment with pembrolizumab Followed by Dr. Delton Coombes  Hyperlipidemia- (present on admission) Statin on hold due to elevated LFT       Advance Care Planning: Full code discussed with patient at time of admission  Consults: Cardiology   Family Communication: At bedside  Severity of Illness: The appropriate patient status for this patient is OBSERVATION. Observation status is judged to be reasonable and necessary in order to provide the required intensity of  service to ensure the patient's safety. The patient's presenting symptoms, physical exam findings, and initial radiographic and laboratory data in the context of their medical condition is felt to place them at decreased risk for further clinical deterioration. Furthermore, it is anticipated that the patient will be medically stable for discharge from the hospital within 2 midnights of admission.   Author: Dessa Phi, DO 12/31/2021 3:49 PM  For on call review [email or web address].

## 2021-12-31 NOTE — Plan of Care (Signed)

## 2021-12-31 NOTE — ED Notes (Signed)
Cardiologist at bedside for assessment.

## 2021-12-31 NOTE — Progress Notes (Signed)
[place] PA at the bedside. Patient presents today for IV fluids. Upon assessment patient short of breath and heart rate 170's. Patient states she is short of breath. Orders received to transport patient to the ER at this time from R. Pennington PA.  09:28 Report called to ER charge nurse H. Crawford Therapist, sports . Reported heart rate 170 -180's and patient short of breath with verbal orders received from R. Pennington PA to transport patient to the ER .   Patient taken to the ER by B. Brown RN by wheel chair to room 4 in the ER.

## 2021-12-31 NOTE — Consult Note (Addendum)
Cardiology Consultation:   Patient ID: Elizabeth Wu MRN: [id number]; DOB: [birth year]  Admit date: 12/31/2021 Date of Consult: 12/31/2021  PCP:  [name], DO   [facility] Providers Cardiologist:  Candee Furbish, MD   {  Patient Profile:   Elizabeth Wu is a 71 y.o. 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 Roderic Palau.  History of Present Illness:   Ms. Spragg was recently examined by Dr. Marlou Porch on 12/09/2021 for follow-up from recent ED evaluation during which she was found to be in atrial flutter with RVR. Received IV Cardizem and IV Lopressor while in the ED and converted back to normal sinus rhythm. Had not been started on anticoagulation at that time given possible brain mets on MRI and upcoming port placement. She was in sinus tachycardia at the time with follow-up with Dr. Marlou Porch, therefore Lopressor was titrated to 50 mg twice daily.  She presented to the Oncology office today for planned IV fluids and reported being short of breath and heart rate was in the 170's. She was therefore transferred to the ED for further evaluation. In talking with the patient and her grandson today, she reports more shortness of breath this morning while getting ready for her appointment. Reports intermittent palpitations as well but denies any specific chest pain. No recent orthopnea, PND or pitting edema. She has experienced a decreased appetite since being started on treatment with Keytruda.  Initial labs show WBC 12.7, Hgb 12.3, platelets 433, Na+ 135, K+ 2.8 and creatinine 0.88. AST 73 and ALT 131. Initial HS Troponin 138 with repeat values pending. CXR showing known metastatic disease and emphysema with no acute abnormalities. EKG showing atrial flutter with RVR and ST depression along  the inferior and lateral leads.    She received IV Cardizem 10mg  and K+ supplementation has been ordered. SBP currently in the 90's and HR variable but into the 150's at times.   Past Medical History:  Diagnosis Date   Allergy    Cancer ([place])    Breast   Complication of anesthesia    DJD (degenerative joint disease)    Hyperlipidemia    Hypertension    Osteopenia    PONV (postoperative nausea and vomiting)     Past Surgical History:  Procedure Laterality Date   APPENDECTOMY     BRONCHIAL BIOPSY  11/11/2021   Procedure: BRONCHIAL BIOPSIES;  Surgeon: Collene Gobble, MD;  Location: [place];  Service: Pulmonary;;   BRONCHIAL BRUSHINGS  11/11/2021   Procedure: BRONCHIAL BRUSHINGS;  Surgeon: Collene Gobble, MD;  Location: [place];  Service: Pulmonary;;   BRONCHIAL NEEDLE ASPIRATION BIOPSY  11/11/2021   Procedure: BRONCHIAL NEEDLE ASPIRATION BIOPSIES;  Surgeon: Collene Gobble, MD;  Location: [facility];  Service: Pulmonary;;   FIDUCIAL MARKER PLACEMENT  11/11/2021   Procedure: FIDUCIAL MARKER PLACEMENT;  Surgeon: Collene Gobble, MD;  Location: [facility];  Service: Pulmonary;;   HERNIA REPAIR     PORTACATH PLACEMENT Left 12/10/2021   Procedure: INSERTION PORT-A-CATH;  Surgeon: Virl Cagey, MD;  Location: [facility];  Service: General;  Laterality: Left;   TONSILLECTOMY     TONSILLECTOMY       Home Medications:  Prior to Admission medications   Medication Sig Start Date End Date Taking? Authorizing Provider  anastrozole (ARIMIDEX) 1 MG tablet Take 1 tablet (1 mg total) by mouth daily. 11/18/21   Derek Jack, MD  bisacodyl (DULCOLAX) 10 MG suppository Place 1 suppository (10 mg total) rectally as needed for moderate constipation. 12/28/21   Harriett Rush, PA-C  Cholecalciferol (VITAMIN D) 50 MCG (2000 UT) tablet Take 2,000 Units by mouth daily.    [provider]  cyanocobalamin (,VITAMIN B-12,) 1000 MCG/ML injection Inject 1,000 mcg into  the muscle every 30 (thirty) days.    [provider]  docusate sodium (COLACE) 100 MG capsule Take 100 mg by mouth 2 (two) times daily.    [provider]  Ensure (ENSURE) Take 237 mLs by mouth 2 (two) times daily between meals.    [provider]  fluconazole (DIFLUCAN) 100 MG tablet Take 1 tablet (100 mg total) by mouth daily. Take two tabs on day one followed by one tablet daily. 11/18/21   Derek Jack, MD  folic acid (FOLVITE) 1 MG tablet Take 1 mg by mouth daily.    [provider]  ibuprofen (ADVIL) 200 MG tablet Take 400 mg by mouth every 6 (six) hours as needed for moderate pain.    [provider]  Lactulose 20 GM/30ML SOLN Take 30 mLs (20 g total) by mouth 3 (three) times daily as needed (Consipation). 12/28/21   Harriett Rush, PA-C  megestrol (MEGACE) 400 MG/10ML suspension Take 10 mLs (400 mg total) by mouth 2 (two) times daily. 11/18/21   Derek Jack, MD  metoprolol tartrate (LOPRESSOR) 50 MG tablet Take 1 tablet (50 mg total) by mouth 2 (two) times daily. 12/09/21 03/09/22  Jerline Pain, MD  Misc. Devices MISC Please provide with wheelchair; dx metastatic lung cancer 12/13/21   Derek Jack, MD  nystatin (MYCOSTATIN) 100000 UNIT/ML suspension Take 5 mLs (500,000 Units total) by mouth 4 (four) times daily for 3 days. 12/28/21 12/31/21  Harriett Rush, PA-C  Omega-3 Fatty Acids (FISH OIL) 1000 MG CAPS Take 1,000 mg by mouth daily.    [provider]  Oxycodone HCl 10 MG TABS Take 1 tablet (10 mg total) by mouth every 6 (six) hours as needed. 12/02/21   Derek Jack, MD  oxyCODONE-acetaminophen (PERCOCET) 10-325 MG tablet Take 1 tablet by mouth every 4 (four) hours as needed for pain. 12/28/21   Harriett Rush, PA-C  prochlorperazine (COMPAZINE) 10 MG tablet Take 1 tablet (10 mg total) by mouth every 6 (six) hours as needed for nausea or vomiting. 10/25/21   Derek Jack, MD   simvastatin (ZOCOR) 20 MG tablet Take 1 tablet (20 mg total) by mouth at bedtime. 11/30/20   [name], DO    Inpatient Medications: Scheduled Meds:  amiodarone  150 mg Intravenous Once   Continuous Infusions:  amiodarone     Followed by   amiodarone     potassium chloride     potassium chloride     potassium chloride     potassium chloride 10 mEq (12/31/21 1150)   PRN Meds:   Allergies:   No Known Allergies  Social History:   Social History   Socioeconomic History   Marital status: Single    Spouse name: Not on file   Number of children: Not on file   Years of education: Not on file   Highest education level: 12th grade  Occupational History   Occupation: [employer]  Tobacco Use   Smoking status: Every Day    Packs/day: 0.30  Years: 55.00    Pack years: 16.50    Types: Cigarettes   Smokeless tobacco: Never  Vaping Use   Vaping Use: Never used  Substance and Sexual Activity   Alcohol use: No   Drug use: No   Sexual activity: Not on file  Other Topics Concern   Not on file  Social History Narrative   Not on file   Social Determinants of Health   Financial Resource Strain: Not on file  Food Insecurity: Not on file  Transportation Needs: Not on file  Physical Activity: Not on file  Stress: Not on file  Social Connections: Not on file  Intimate Partner Violence: Not on file    Family History:    Family History  Problem Relation Age of Onset   Alzheimer's disease Mother    Cancer Father        stomach cancer   Diabetes Sister    Heart disease Brother        MI     ROS:  Please see the history of present illness.   All other ROS reviewed and negative.     Physical Exam/Data:   Vitals:   12/31/21 1024 12/31/21 1030 12/31/21 1100 12/31/21 1130  BP:  93/61 93/61 103/77  Pulse: (!) 168 81 (!) 110 91  Resp: 12 (!) 21 (!) 23 (!) 29  Temp:      TempSrc:      SpO2: 92% 93% 94% 96%  Weight:      Height:       No intake or  output data in the 24 hours ending 12/31/21 1215 Last 3 Weights 12/31/2021 12/21/2021 12/13/2021  Weight (lbs) 118 lb 118 lb 122 lb 14.4 oz  Weight (kg) 53.524 kg 53.524 kg 55.747 kg     Body mass index is 20.25 kg/m.  General: Pleasant female appearing in no acute distress.  HEENT: normal Neck: no JVD Vascular: No carotid bruits; Distal pulses 2+ bilaterally Cardiac:  normal S1, S2; Irregularly irregular, tachycardiac.  Lungs:  clear to auscultation bilaterally, no wheezing, rhonchi or rales  Abd: soft, nontender, no hepatomegaly  Ext: no pitting edema Musculoskeletal:  No deformities, BUE and BLE strength normal and equal Skin: warm and dry  Neuro:  CNs 2-12 intact, no focal abnormalities noted Psych:  Normal affect   EKG:  The EKG was personally reviewed and demonstrates: Atrial flutter with RVR and ST depression along the inferior and lateral leads.    Telemetry:  Telemetry was personally reviewed and demonstrates: Atrial flutter with RVR, HR in 150's to 160's with brief episodes of sinus beats.   Relevant CV Studies:  Echocardiogram: Pending  Laboratory Data:  High Sensitivity Troponin:   Recent Labs  Lab 12/06/21 1001 12/06/21 1216 12/31/21 1001  TROPONINIHS 5 9 138*     Chemistry Recent Labs  Lab 12/28/21 0759 12/31/21 1001  NA 137 135  K 3.8 2.8*  CL 101 103  CO2 24 21*  GLUCOSE 120* 174*  BUN 35* 34*  CREATININE 0.79 0.88  CALCIUM 9.6 9.1  GFRNONAA >60 >60  ANIONGAP 12 11    Recent Labs  Lab 12/28/21 0759 12/31/21 1001  PROT 7.4 6.3*  ALBUMIN 3.7 3.2*  AST 39 73*  ALT 98* 131*  ALKPHOS 73 60  BILITOT 1.7* 1.9*   Lipids No results for input(s): CHOL, TRIG, HDL, LABVLDL, LDLCALC, CHOLHDL in the last 168 hours.  Hematology Recent Labs  Lab 12/28/21 0759 12/31/21 1001  WBC 10.3 12.7*  RBC 4.00 3.89  HGB 12.5 12.3  HCT 37.7 36.0  MCV 94.3 92.5  MCH 31.3 31.6  MCHC 33.2 34.2  RDW 12.9 13.0  PLT 446* 433*   Thyroid No results for  input(s): TSH, FREET4 in the last 168 hours.  BNPNo results for input(s): BNP, PROBNP in the last 168 hours.  DDimer No results for input(s): DDIMER in the last 168 hours.   Radiology/Studies:  DG Chest Port 1 View  Result Date: 12/31/2021 CLINICAL DATA:  Tachycardia with weakness.  History of hypertension. EXAM: PORTABLE CHEST 1 VIEW COMPARISON:  Radiographs 12/10/2021 and 12/06/2021.  CT 12/06/2021. FINDINGS: 1012 hours. Left subclavian Port-A-Cath tip projects over the lower SVC, unchanged. The heart size and mediastinal contours are stable. There is aortic atherosclerosis. Extensive reticulonodular densities throughout both lungs are grossly stable. The dominant cavitary lesion in the right lower lobe on CT projects over the right hilum and is not well visualized. No superimposed airspace disease, pleural effusion or pneumothorax identified. The bones appear unchanged. Telemetry leads overlie the chest. IMPRESSION: Grossly stable radiographic appearance of the chest with diffuse reticulonodular densities corresponding with probable metastatic disease superimposed on emphysema. No apparent acute superimposed process. Electronically Signed   By: Richardean Sale M.D.   On: 12/31/2021 10:29     Assessment and Plan:   1. Atrial Flutter with RVR - This was initially diagnosed last month and she converted back to normal sinus rhythm while in the ED and a rate control strategy was recommended given that she was not an anticoagulation candidate given brain mets by recent imaging. - She continues to have intermittent palpitations and is noted to be in atrial flutter with RVR again. Her soft BP limits further titration of AV nodal blocking agents. Therefore, reviewed with Dr. Marlou Porch and will start IV Amiodarone. Anticipate switching to 400 mg twice daily for 1 week followed by 200 mg daily at the time of discharge. If she converts to normal sinus rhythm with IV Amiodarone, could possibly go home later today  from the ED but if she continues having episodes, could admit for 24-hour IV loading then switch to PO dosing. Would reduce PTA Lopressor to 25mg  BID given her soft BP. She is scheduled for an outpatient echocardiogram next week and would continue with plans for this unless admitted as it could be obtained once her heart rate improves. - Her CHA2DS2-VASc Score is at least 4. Not a candidate for anticoagulation at this time given brain mets by recent imaging.  2. Metastatic lung cancer to the skin and omentum along with bilateral breast cancer  - Being followed by Oncology as an outpatient and currently receiving Keytruda.   3. Elevated Troponin Values - Initial Hs Troponin was elevated to 138 with repeat values pending. She denies any recent chest pain and suspect her enzyme elevation is secondary to demand ischemia in the setting of atrial flutter with RVR. She is scheduled for an outpatient echocardiogram. No plans for ischemic evaluation at this time given her multiple medical issues.  4. Elevated LFT's -  AST 73 and ALT 131 on admission. She was on Simvastatin prior to admission and would hold for now.  Continue to follow LFT's closely as we are starting Amiodarone as outlined above.    Risk Assessment/Risk Scores:    CHA2DS2-VASc Score = 4   This indicates a 4.8% annual risk of stroke. The patient's score is based upon: CHF History: 0 HTN History: 1 Diabetes History: 0 Stroke History: 0  Vascular Disease History: 1 Age Score: 1 Gender Score: 1     For questions or updates, please contact [place] Please consult [email or web address] for contact info under    Signed, Erma Heritage, PA-C  12/31/2021 12:15 PM  Personally seen and examined. Agree with above.  We have given her IV bolus amiodarone.  This seemed to help out significantly on restoration of normal rhythm.  She was showing some brief periods of normal sinus rhythm prior to her amiodarone.  I would be fine with her  getting amiodarone 200 mg p.o. twice daily at home for a gentle load.  We will set up follow-up for her.  Hypokalemia per ER team.  Discussed with Dr. Roderic Palau.  He may have her brought in for observation in order to completely replete her potassium levels.  We will sign off.  Please let us know if we can be of further assistance.  Candee Furbish, MD

## 2021-12-31 NOTE — Assessment & Plan Note (Deleted)
Replace, trend Check magnesium

## 2021-12-31 NOTE — ED Notes (Signed)
Date and time results received: 12/31/21 1100   Test: Troponin Critical Value: 138  Name of Provider Notified: Dr. Roderic Palau  Orders Received? Or Actions Taken?: notified

## 2021-12-31 NOTE — Assessment & Plan Note (Addendum)
Evaluated by cardiology in the emergency department CHA2DS2-VASc score 4, however not a candidate for anticoagulation due to brain mets Loaded with IV amiodarone, transition to p.o. 200 mg twice daily Remains in normal sinus rhythm today

## 2021-12-31 NOTE — Assessment & Plan Note (Signed)
Currently under treatment with pembrolizumab Followed by Dr. Delton Coombes

## 2021-12-31 NOTE — Assessment & Plan Note (Signed)
Followed by Dr. Delton Coombes

## 2022-01-01 DIAGNOSIS — I4891 Unspecified atrial fibrillation: Secondary | ICD-10-CM | POA: Diagnosis not present

## 2022-01-01 LAB — HEPATIC FUNCTION PANEL
ALT: 127 U/L — ABNORMAL HIGH (ref 0–44)
AST: 58 U/L — ABNORMAL HIGH (ref 15–41)
Albumin: 2.7 g/dL — ABNORMAL LOW (ref 3.5–5.0)
Alkaline Phosphatase: 51 U/L (ref 38–126)
Bilirubin, Direct: 0.3 mg/dL — ABNORMAL HIGH (ref 0.0–0.2)
Indirect Bilirubin: 0.8 mg/dL (ref 0.3–0.9)
Total Bilirubin: 1.1 mg/dL (ref 0.3–1.2)
Total Protein: 5.2 g/dL — ABNORMAL LOW (ref 6.5–8.1)

## 2022-01-01 LAB — CBC
HCT: 30.3 % — ABNORMAL LOW (ref 36.0–46.0)
Hemoglobin: 9.9 g/dL — ABNORMAL LOW (ref 12.0–15.0)
MCH: 31.6 pg (ref 26.0–34.0)
MCHC: 32.7 g/dL (ref 30.0–36.0)
MCV: 96.8 fL (ref 80.0–100.0)
Platelets: 308 10*3/uL (ref 150–400)
RBC: 3.13 MIL/uL — ABNORMAL LOW (ref 3.87–5.11)
RDW: 13.2 % (ref 11.5–15.5)
WBC: 8 10*3/uL (ref 4.0–10.5)
nRBC: 0 % (ref 0.0–0.2)

## 2022-01-01 LAB — BASIC METABOLIC PANEL
Anion gap: 7 (ref 5–15)
BUN: 27 mg/dL — ABNORMAL HIGH (ref 8–23)
CO2: 20 mmol/L — ABNORMAL LOW (ref 22–32)
Calcium: 8 mg/dL — ABNORMAL LOW (ref 8.9–10.3)
Chloride: 109 mmol/L (ref 98–111)
Creatinine, Ser: 0.59 mg/dL (ref 0.44–1.00)
GFR, Estimated: >60 mL/min (ref >60)
Glucose, Bld: 92 mg/dL (ref 70–99)
Potassium: 4 mmol/L (ref 3.5–5.1)
Sodium: 136 mmol/L (ref 135–145)

## 2022-01-01 LAB — HIV ANTIBODY (ROUTINE TESTING W REFLEX): HIV Screen 4th Generation wRfx: NONREACTIVE

## 2022-01-01 MED ORDER — AMIODARONE HCL 200 MG PO TABS: 200.0000 mg | ORAL_TABLET | Freq: Two times a day (BID) | ORAL | 1 refills | Status: AC

## 2022-01-01 NOTE — Discharge Summary (Signed)
Physician Discharge Summary   Patient: Elizabeth Wu MRN: [id number] DOB: 06/30/1951  Admit date:     12/31/2021  Discharge date: 01/01/22  Discharge Physician: Dessa Phi   PCP: [name], DO   Recommendations at discharge:   Follow-up with PCP, cardiology 2/20, oncology 2/21  Discharge Diagnoses: Principal Problem:   Atrial fibrillation with RVR ([place]) Active Problems:   Hyperlipidemia   Metastatic primary lung cancer ([place])   Bilateral breast cancer ([place])   Demand ischemia ([place])  Resolved Problems:   Hypokalemia   Hospital Course: Elizabeth Wu is a 71 y.o. female with medical history significant of bilateral breast cancer and metastatic lung cancer, recent history of A Fib RVR, HLD, who presents to the hospital with chief complaint of tachycardia.  Patient had gone to cancer center for IV fluids, however upon assessment patient was found to be short of breath with heart rate in the 170s.  Patient was immediately sent down to the ER. In the emergency department, she was found to be in a flutter with RVR.  She was given IV Cardizem but due to hypotension, cardiology was consulted.  She was started on IV amiodarone.  Patient referred for observation due to significant hypokalemia.  Patient remained in normal sinus rhythm on p.o. amiodarone.  Potassium was replaced with normalization.  On day of discharge, she was feeling well, back to her baseline.  She will follow-up closely with her PCP, cardiology.  Assessment and Plan: * Atrial fibrillation with RVR ([place])- (present on admission) Evaluated by cardiology in the emergency department CHA2DS2-VASc score 4, however not a candidate for anticoagulation due to brain mets Loaded with IV amiodarone, transition to p.o. 200 mg twice daily Remains in normal sinus rhythm today  Demand ischemia ([place])- (present on admission) Troponin 138 >> 124 Not consistent with ACS Likely in setting of A Fib RVR   Bilateral breast  cancer ([place])- (present on admission) Followed by Dr. Delton Coombes  Metastatic primary lung cancer [facility])- (present on admission) Currently under treatment with pembrolizumab Followed by Dr. Delton Coombes  Hyperlipidemia- (present on admission) Statin on hold due to elevated LFT           Consultants: Cardiology Procedures performed: None   Disposition: Home Diet recommendation:  Cardiac diet  DISCHARGE MEDICATION: Allergies as of 01/01/2022   No Known Allergies      Medication List     STOP taking these medications    metoprolol tartrate 50 MG tablet Commonly known as: LOPRESSOR   nystatin 100000 UNIT/ML suspension Commonly known as: MYCOSTATIN   simvastatin 20 MG tablet Commonly known as: ZOCOR       TAKE these medications    amiodarone 200 MG tablet Commonly known as: PACERONE Take 1 tablet (200 mg total) by mouth 2 (two) times daily.   anastrozole 1 MG tablet Commonly known as: ARIMIDEX Take 1 tablet (1 mg total) by mouth daily.   bisacodyl 10 MG suppository Commonly known as: Dulcolax Place 1 suppository (10 mg total) rectally as needed for moderate constipation.   cyanocobalamin 1000 MCG/ML injection Commonly known as: (VITAMIN B-12) Inject 1,000 mcg into the muscle every 30 (thirty) days.   docusate sodium 100 MG capsule Commonly known as: COLACE Take 100 mg by mouth 2 (two) times daily.   Ensure Take 237 mLs by mouth 2 (two) times daily between meals.   Fish Oil 1000 MG Caps Take 1,000 mg by mouth daily.   folic acid 1 MG tablet Commonly known as:  FOLVITE Take 1 mg by mouth daily.   megestrol 400 MG/10ML suspension Commonly known as: MEGACE Take 10 mLs (400 mg total) by mouth 2 (two) times daily.   Misc. Devices Misc Please provide with wheelchair; dx metastatic lung cancer   Oxycodone HCl 10 MG Tabs Take 1 tablet (10 mg total) by mouth every 6 (six) hours as needed.   prochlorperazine 10 MG tablet Commonly known as:  COMPAZINE Take 1 tablet (10 mg total) by mouth every 6 (six) hours as needed for nausea or vomiting.   Vitamin D 50 MCG (2000 UT) tablet Take 2,000 Units by mouth daily.        Follow-up Information     Imogene Burn, PA-C. Go on 01/10/2022.   Specialty: Cardiology Contact information: [place] [address] [phone]         Derek Jack, MD. Go on 01/11/2022.   Specialty: Hematology Contact information: [place] [address] [phone]         [name], DO Follow up.   Specialty: Family Medicine Contact information: [place] [address] [phone]                 Discharge Exam: Danley Danker Weights   12/31/21 1018  Weight: 53.5 kg   Examination: General exam: Appears calm and comfortable  Respiratory system: Clear to auscultation. Respiratory effort normal. Cardiovascular system: S1 & S2 heard, RRR. No pedal edema. Gastrointestinal system: Abdomen is nondistended, soft and nontender. Normal bowel sounds heard. Central nervous system: Alert and oriented. Non focal exam. Speech clear  Extremities: Symmetric in appearance bilaterally  Skin: No rashes, lesions or ulcers on exposed skin  Psychiatry: Judgement and insight appear stable. Mood & affect appropriate.    Condition at discharge: stable  The results of significant diagnostics from this hospitalization (including imaging, microbiology, ancillary and laboratory) are listed below for reference.   Imaging Studies: CT Angio Chest PE W and/or Wo Contrast  Result Date: 12/06/2021 CLINICAL DATA:  Pulmonary embolism suspected. High probability due to tachycardia and left-sided chest pain that began this morning EXAM: CT ANGIOGRAPHY CHEST WITH CONTRAST TECHNIQUE: Multidetector CT imaging of the chest was performed using the standard protocol during bolus administration of intravenous contrast. Multiplanar CT image reconstructions and MIPs  were obtained to evaluate the vascular anatomy. RADIATION DOSE REDUCTION: This exam was performed according to the departmental dose-optimization program which includes automated exposure control, adjustment of the mA and/or kV according to patient size and/or use of iterative reconstruction technique. CONTRAST:  84mL OMNIPAQUE IOHEXOL 350 MG/ML SOLN COMPARISON:  Noncontrast chest CT 11/08/2021 FINDINGS: Cardiovascular: Satisfactory opacification of the pulmonary arteries to the segmental level. No evidence of pulmonary embolism. Normal heart size. No pericardial effusion. Atheromatous calcification of the aorta and coronaries. Irregular luminal plaque at the descending aorta. Mediastinum/Nodes: Negative for adenopathy Lungs/Pleura: Diffuse micro nodularity and fissure lobulation. Partially cavitary mass in the subpleural right lower lobe measuring 3.3 cm. No detected progression of these nodules but there is superimposed interlobular septal thickening. No pleural effusion. No focal consolidation. Upper Abdomen: No acute finding Musculoskeletal: No acute or aggressive finding. Other: 2 left breast masses. The lower and more medial now measures 18 mm as compared to 15 mm. Review of the MIP images confirms the above findings. IMPRESSION: 1. Negative for pulmonary embolism. 2. Mild interstitial edema superimposed on pulmonary metastatic disease with lymphangitic component. 3. Two left breast masses, 1 measuring mildly larger than on  CT last month. Electronically Signed   By: Jorje Guild M.D.   On: 12/06/2021 11:35   DG OP Swallowing Func-Medicare/Speech Path  Result Date: 12/20/2021 Table formatting from the original result was not included. [place], [place], [address] Phone: [phone]   Fax:  [phone]   Modified Barium Swallow   Patient Details Name: KEARA PAGLIARULO MRN: [id number] Date of Birth: 1951-02-02 No data recorded   Encounter  Date: 12/20/2021     End of Session - 12/20/21 1226      Visit Number 1    Number of Visits 1    Authorization Type [employer]    SLP Start Time 1132    SLP Stop Time  1205    SLP Time Calculation (min) 33 min    Activity Tolerance Patient tolerated treatment well                  Past Medical History: Diagnosis Date  Allergy    Cancer ([place])     Breast  Complication of anesthesia    DJD (degenerative joint disease)    Hyperlipidemia    Hypertension    Osteopenia    PONV (postoperative nausea and vomiting)           Past Surgical History: Procedure Laterality Date  APPENDECTOMY      BRONCHIAL BIOPSY   11/11/2021   Procedure: BRONCHIAL BIOPSIES;  Surgeon: Collene Gobble, MD;  Location: [facility];  Service: Pulmonary;;  BRONCHIAL BRUSHINGS   11/11/2021   Procedure: BRONCHIAL BRUSHINGS;  Surgeon: Collene Gobble, MD;  Location: [facility];  Service: Pulmonary;;  BRONCHIAL NEEDLE ASPIRATION BIOPSY   11/11/2021   Procedure: BRONCHIAL NEEDLE ASPIRATION BIOPSIES;  Surgeon: Collene Gobble, MD;  Location: [facility];  Service: Pulmonary;;  FIDUCIAL MARKER PLACEMENT   11/11/2021   Procedure: FIDUCIAL MARKER PLACEMENT;  Surgeon: Collene Gobble, MD;  Location: [facility];  Service: Pulmonary;;  HERNIA REPAIR      PORTACATH PLACEMENT Left 12/10/2021   Procedure: INSERTION PORT-A-CATH;  Surgeon: Virl Cagey, MD;  Location: [facility];  Service: General;  Laterality: Left;  TONSILLECTOMY      TONSILLECTOMY         There were no vitals filed for this visit.     Subjective Assessment - 12/20/21 1218      Subjective "I just don't have an appetite."    Special Tests MBSS    Currently in Pain? No/denies               ?  ?      General - 12/20/21 1219             General Information   Date of Onset 12/15/21    HPI Ms. SABENA WINNER is a 71 y.o. female who was referred for MBSS by Dr. Berniece Salines. 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 She is currently receiving  anastrozole. She is pending start of immunotherapy with Keytruda on 1/26. RD reports: Patient is taking appetite stimulant (Megace). She is drinking 2 Ensure Plus daily. Patient endorses dysphagia, states her "last real meal was 09/28" Patient reports difficulty with regular textures for "the longest time but managed." This has worsened in the last few months, now limited to foods that "slide down easy" recalling jello, oatmeal, pudding, mashed potatoes, soups, hard boiled eggs.    Type of Study MBS-Modified Barium Swallow  Study    Diet Prior to this Study Regular;Thin liquids    Temperature Spikes Noted No    Respiratory Status Room air    History of Recent Intubation No    Behavior/Cognition Alert;Cooperative;Pleasant mood    Oral Cavity Assessment Within Functional Limits    Oral Care Completed by SLP No    Oral Cavity - Dentition Edentulous    Vision Functional for self feeding    Self-Feeding Abilities Able to feed self    Patient Positioning Upright in chair    Baseline Vocal Quality Normal    Volitional Cough Strong    Volitional Swallow Able to elicit    Anatomy Within functional limits    Pharyngeal Secretions Not observed secondary MBS               ?      Oral Preparation/Oral Phase - 12/20/21 1220             Oral Preparation/Oral Phase   Oral Phase Impaired        Oral - Thin   Oral - Thin Teaspoon Within functional limits    Oral - Thin Cup Within functional limits    Oral - Thin Straw Within functional limits        Oral - Solids   Oral - Puree Within functional limits    Oral - Regular Piecemeal swallowing;Imparied mastication;Delayed A-P transit    Oral - Pill Within functional limits        Electrical stimulation - Oral Phase   Was Electrical Stimulation Used No                 Pharyngeal Phase - 12/20/21 1221             Pharyngeal Phase   Pharyngeal Phase Within functional limits        Electrical Stimulation - Pharyngeal Phase   Was Electrical Stimulation Used No                 Cricopharyngeal  Phase - 12/20/21 1221             Cervical Esophageal Phase   Cervical Esophageal Phase Impaired        Cervical Esophageal Phase - Comment   Other Esophageal Phase Observations delayed emptying in esophagus with solids and barium tablet, but did eventually clear               ?    Plan - 12/20/21 1227      Clinical Impression Statement MBSS completed this date. Pt presents with mild oral phase dysphagia, WNL pharyngeal phase dysphagia, and suspected primary esophageal dysphagia. 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 Pt with timely swallow initiation, hyolaryngeal excursion, no penetration/aspiration, or significant residuals. Esophageal sweep reveals delayed emptying with solids and barium tablet, but did eventually clear with puree and liquid wash. Pt initially stated she did not have any difficulty swallowing, but stated that she didn't have an appetite. After discussing with Pt's sister, she stated that she had some trouble with solids and felt they were more difficult to masticate. Given that Pt is edentulous and likely with xerostomia, she may benefit from adding moisture to solids and cut up solids into small pieces. Recommend self regulated regular textures and thin liquids with reflux precautions (provided in written form to Pt). No further SLP services indicated at this time.  Consulted and Agree with Plan of Care Patient               Patient will benefit from skilled therapeutic intervention in order to improve the following deficits and impairments:  Dysphagia, oropharyngeal phase   ?      Recommendations/Treatment - 12/20/21 1225             Swallow Evaluation Recommendations   Recommended Consults Consider esophageal assessment    SLP Diet Recommendations Age appropriate regular;Thin    Liquid Administration via Cup;Straw    Medication Administration Whole meds with liquid     Supervision Patient able to self feed    Postural Changes Seated upright at 90 degrees;Remain upright for at least 30 minutes after feeds/meals                 Prognosis - 12/20/21 1226             Prognosis   Prognosis for Safe Diet Advancement Good        Individuals Consulted   Consulted and Agree with Results and Recommendations Patient    Report Sent to  Referring physician               Problem List    Patient Active Problem List   Diagnosis Date Noted  Primary lung adenocarcinoma ([place]) 12/10/2021  Atrial flutter ([place]) 12/09/2021  Pulmonary nodules 11/05/2021  Malignant neoplasm of upper-outer quadrant of right female breast ([place]) 09/30/2021  Malignant neoplasm of upper-inner quadrant of left female breast ([place]) 09/30/2021  Malignant neoplasm of upper-outer quadrant of left female breast ([place]) 09/30/2021  Breast lump in female 08/06/2021  Pain in right hip 06/03/2021  Pelvic fracture ([place]) 06/03/2021  Viral upper respiratory tract infection 04/14/2021  Chronic constipation 05/20/2020  Hypertension 05/29/2019  Cholelithiases 09/13/2016  Bursitis, hip 04/05/2016  Healthcare maintenance 11/04/2015  Meralgia paraesthetica 04/22/2014  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 [place], [place], [address] Phone: [phone]   Fax:  [phone]   Name: ALEXSYS ESKIN MRN: [id number] Date of Birth: 02/13/1951 CLINICAL DATA:  Dysphagia. Cough/GE reflux disease/other secondary diagnosis EXAM: MODIFIED BARIUM SWALLOW TECHNIQUE: Different consistencies of barium were administered orally to the patient by the Speech Pathologist. Imaging of the pharynx was performed in the lateral projection. FLUOROSCOPY TIME:  Fluoroscopy Time:  1 minutes 6  seconds Radiation Exposure Index (if provided by the fluoroscopic device): 39.2 mGy Number of Acquired Spot Images: 0 COMPARISON:  None. FINDINGS: Modified barium swallow was performed by the speech pathologist. Negative for aspiration/penetration. Please refer to the Speech Pathology report for results and recommendations. IMPRESSION: Please refer to the Speech Pathologists report for complete details and recommendations. Electronically Signed   By: Corrie Mckusick D.O.   On: 12/20/2021 12:18   DG Chest Port 1 View  Result Date: 12/31/2021 CLINICAL DATA:  Tachycardia with weakness.  History of hypertension. EXAM: PORTABLE CHEST 1 VIEW COMPARISON:  Radiographs 12/10/2021 and 12/06/2021.  CT 12/06/2021. FINDINGS: 1012 hours. Left subclavian Port-A-Cath tip projects over the lower SVC, unchanged. The heart size and mediastinal contours are stable. There is aortic atherosclerosis. Extensive reticulonodular densities throughout both lungs are grossly stable. The dominant cavitary lesion in the right lower lobe on CT projects over the  right hilum and is not well visualized. No superimposed airspace disease, pleural effusion or pneumothorax identified. The bones appear unchanged. Telemetry leads overlie the chest. IMPRESSION: Grossly stable radiographic appearance of the chest with diffuse reticulonodular densities corresponding with probable metastatic disease superimposed on emphysema. No apparent acute superimposed process. Electronically Signed   By: Richardean Sale M.D.   On: 12/31/2021 10:29   DG Chest Port 1 View  Result Date: 12/10/2021 CLINICAL DATA:  71 year old female left Port-A-Cath placement. Metastatic breast cancer. EXAM: PORTABLE CHEST 1 VIEW COMPARISON:  Chest CTA 12/06/2021 and earlier. FINDINGS: Portable AP semi upright view at 1004 hours. Left subclavian approach power port placed. No pneumothorax or adverse features identified. Stable cardiac size and mediastinal contours. Extensive bilateral  reticulonodular abnormal pulmonary opacity persists. No pleural effusion or consolidation. Negative visible bowel gas. Stable visualized osseous structures. IMPRESSION: 1. Left chest Port-A-Cath with no adverse features. 2. 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 Electronically Signed   By: Genevie Ann M.D.   On: 12/10/2021 10:13   DG Chest Port 1 View  Result Date: 12/06/2021 CLINICAL DATA:  Shortness of breath.  Tachycardia. EXAM: PORTABLE CHEST 1 VIEW COMPARISON:  11/11/2021 and chest CT 08/02/2021 FINDINGS: Prominent interstitial lung markings appear to be chronic. In addition, there are tiny nodular densities in lungs and similar to the prior chest CT. No significant airspace disease or lung consolidation. Heart size is within normal limits and stable. Trachea is midline. Negative for a pneumothorax. No overt pulmonary edema. IMPRESSION: Chronic lung changes without acute findings. Electronically Signed   By: Markus Daft M.D.   On: 12/06/2021 10:02   DG C-Arm 1-60 Min-No Report  Result Date: 12/10/2021 Fluoroscopy was utilized by the requesting physician.  No radiographic interpretation.    Microbiology: Results for orders placed or performed during the hospital encounter of 12/31/21  Resp Panel by RT-PCR (Flu A&B, Covid) Nasopharyngeal Swab     Status: None   Collection Time: 12/31/21  1:29 PM   Specimen: Nasopharyngeal Swab; Nasopharyngeal(NP) swabs in vial transport medium  Result Value Ref Range Status   SARS Coronavirus 2 by RT PCR NEGATIVE NEGATIVE Final    Comment: (NOTE) SARS-CoV-2 target nucleic acids are NOT DETECTED.  The SARS-CoV-2 RNA is generally detectable in upper respiratory specimens during the acute phase of infection. The lowest concentration of SARS-CoV-2 viral copies this assay can detect is 138 copies/mL. A negative result does not preclude SARS-Cov-2 infection and should not be used  as the sole basis for treatment or other patient management decisions. A negative result may occur with  improper specimen collection/handling, submission of specimen other than nasopharyngeal swab, presence of viral mutation(s) within the areas targeted by this assay, and inadequate number of viral copies(<138 copies/mL). A negative result must be combined with clinical observations, patient history, and epidemiological information. The expected result is Negative.  Fact Sheet for Patients:  EntrepreneurPulse.com.au  Fact Sheet for Healthcare Providers:  IncredibleEmployment.be  This test is no t yet approved or cleared by the [place] FDA and  has been authorized for detection and/or diagnosis of SARS-CoV-2 by FDA under an Emergency Use Authorization (EUA). This EUA will remain  in effect (meaning this test can be used) for the duration of the COVID-19 declaration under Section 564(b)(1) of the Act, 21 U.S.C.section 360bbb-3(b)(1), unless the authorization is terminated  or revoked sooner.       Influenza A by PCR NEGATIVE NEGATIVE Final  Influenza B by PCR NEGATIVE NEGATIVE Final    Comment: (NOTE) The Xpert Xpress SARS-CoV-2/FLU/RSV plus assay is intended as an aid in the diagnosis of influenza from Nasopharyngeal swab specimens and should not be used as a sole basis for treatment. Nasal washings and aspirates are unacceptable for Xpert Xpress SARS-CoV-2/FLU/RSV testing.  Fact Sheet for Patients: EntrepreneurPulse.com.au  Fact Sheet for Healthcare Providers: IncredibleEmployment.be  This test is not yet approved or cleared by the [place] FDA and has been authorized for detection and/or diagnosis of SARS-CoV-2 by FDA under an Emergency Use Authorization (EUA). This EUA will remain in effect (meaning this test can be used) for the duration of the COVID-19 declaration under Section 564(b)(1)  of the Act, 21 U.S.C. section 360bbb-3(b)(1), unless the authorization is terminated or revoked.  Performed at [facility], [address] [place], [place] [address]     Labs: CBC: Recent Labs  Lab 12/28/21 0759 12/31/21 1001 01/01/22 0359  WBC 10.3 12.7* 8.0  NEUTROABS 7.0 9.7*  --   HGB 12.5 12.3 9.9*  HCT 37.7 36.0 30.3*  MCV 94.3 92.5 96.8  PLT 446* 433* 453   Basic Metabolic Panel: Recent Labs  Lab 12/28/21 0759 12/31/21 1001 12/31/21 1553 01/01/22 0359  NA 137 135  --  136  K 3.8 2.8*  --  4.0  CL 101 103  --  109  CO2 24 21*  --  20*  GLUCOSE 120* 174*  --  92  BUN 35* 34*  --  27*  CREATININE 0.79 0.88  --  0.59  CALCIUM 9.6 9.1  --  8.0*  MG  --   --  1.7  --    Liver Function Tests: Recent Labs  Lab 12/28/21 0759 12/31/21 1001 01/01/22 0359  AST 39 73* 58*  ALT 98* 131* 127*  ALKPHOS 73 60 51  BILITOT 1.7* 1.9* 1.1  PROT 7.4 6.3* 5.2*  ALBUMIN 3.7 3.2* 2.7*   CBG: No results for input(s): GLUCAP in the last 168 hours.  Discharge time spent: less than 30 minutes.  Signed: Dessa Phi, DO [facility] 01/01/2022

## 2022-01-01 NOTE — Hospital Course (Signed)
Elizabeth Wu is a 71 y.o. female with medical history significant of bilateral breast cancer and metastatic lung cancer, recent history of A Fib RVR, HLD, who presents to the hospital with chief complaint of tachycardia.  Patient had gone to cancer center for IV fluids, however upon assessment patient was found to be short of breath with heart rate in the 170s.  Patient was immediately sent down to the ER. In the emergency department, she was found to be in a flutter with RVR.  She was given IV Cardizem but due to hypotension, cardiology was consulted.  She was started on IV amiodarone.  Patient referred for observation due to significant hypokalemia.  Patient remained in normal sinus rhythm on p.o. amiodarone.  Potassium was replaced with normalization.  On day of discharge, she was feeling well, back to her baseline.  She will follow-up closely with her PCP, cardiology.

## 2022-01-01 NOTE — Progress Notes (Incomplete)
Pt has discharge orders, discharge instructions given to patient and

## 2022-01-01 NOTE — TOC Transition Note (Signed)
Transition of Care [facility]) - CM/SW Discharge Note   Patient Details  Name: Elizabeth Wu MRN: [id number] Date of Birth: [birth year]  Transition of Care [facility]) CM/SW Contact:  Kerin Salen, RN Phone Number: 01/01/2022, 11:03 AM   Clinical Narrative:  Patient discharged today with grandson [place]. TOCRN discussed HHPT per recommendations,they both agreed. Georgina Snell from [facility] notified and agreed to provide service. TOC barriers resolved.     Final next level of care: [place] Barriers to Discharge: Barriers Resolved   Patient Goals and CMS Choice Patient states their goals for this hospitalization and ongoing recovery are:: To return home.      Discharge Placement                  Name of family member notified: Idolina Primer patients grandson Patient and family notified of of transfer: 01/01/22  Discharge Plan and Services                          HH Arranged: PT [facility] Agency: [place] Date [place]: 01/01/22 Time [place]: 7615 Representative spoke with at [place]: [place] Interventions     Readmission Risk Interventions No flowsheet data found.

## 2022-01-01 NOTE — Evaluation (Signed)
Physical Therapy Evaluation Patient Details Name: Elizabeth Wu MRN: [id number] DOB: [birth year] Today's Date: 01/01/2022  History of Present Illness  Elizabeth Wu is a 71 y.o. female with medical history significant of bilateral breast cancer and metastatic lung cancer, recent history of A Fib RVR, HLD, who presents to the hospital with chief complaint of tachycardia.  Patient had gone to cancer center for IV fluids, however upon assessment patient was found to be short of breath with heart rate in the 170s.  Patient was immediately sent down to the ER. In the emergency department, she was found to be in a flutter with RVR.  She was given IV Cardizem but due to hypotension, cardiology was consulted.  She was started on IV amiodarone.  Patient referred for observation due to significant hypokalemia.     Patient states that she had had some exertional shortness of breath but no chest pain or palpitations.  No nausea or vomiting.   Clinical Impression  Patient functioning near baseline for functional mobility and gait demonstrating good return for ambulation in room and hallway without loss of balance and limited mostly due to fatigue.  Patient tolerated sitting up in chair after therapy.   PLAN:  Patient to be discharged home today and discharged from acute physical therapy to care of nursing for ambulation as tolerated for length of stay with recommendations stated below.           Recommendations for follow up therapy are one component of a multi-disciplinary discharge planning process, led by the attending physician.  Recommendations may be updated based on patient status, additional functional criteria and insurance authorization.  Follow Up Recommendations Home health PT    Assistance Recommended at Discharge Set up Supervision/Assistance  Patient can return home with the following  A little help with walking and/or transfers;A little help with bathing/dressing/bathroom;Help with stairs or  ramp for entrance;Assistance with cooking/housework    Equipment Recommendations BSC/3in1 (oblonged shape toliet riser or BSC/3in1)  Recommendations for Other Services       Functional Status Assessment       Precautions / Restrictions Precautions Precautions: Fall Restrictions Weight Bearing Restrictions: No      Mobility  Bed Mobility Overal bed mobility: Modified Independent             General bed mobility comments: HOB raised    Transfers Overall transfer level: Needs assistance Equipment used: Rolling walker (2 wheels) Transfers: Sit to/from Stand, Bed to chair/wheelchair/BSC Sit to Stand: Supervision   Step pivot transfers: Supervision       General transfer comment: slightly labored movement for completing sit to stands    Ambulation/Gait Ambulation/Gait assistance: Supervision Gait Distance (Feet): 75 Feet Assistive device: Rolling walker (2 wheels) Gait Pattern/deviations: Decreased step length - right, Decreased step length - left, Decreased stride length Gait velocity: decreased     General Gait Details: slightly labored cadence with good return for ambulation in room and hallway without loss of balance  Stairs            Wheelchair Mobility    Modified Rankin (Stroke Patients Only)       Balance Overall balance assessment: Needs assistance Sitting-balance support: Feet supported, No upper extremity supported Sitting balance-Leahy Scale: Good Sitting balance - Comments: seated at EOB   Standing balance support: During functional activity, Bilateral upper extremity supported Standing balance-Leahy Scale: Fair Standing balance comment: fair/good using RW  Pertinent Vitals/Pain Pain Assessment Pain Assessment: No/denies pain    Home Living Family/patient expects to be discharged to:: Private residence Living Arrangements: Other relatives Available Help at Discharge: Family;Available 24  hours/day Type of Home: House Home Access: Ramped entrance       Home Layout: One level Home Equipment: Conservation officer, nature (2 wheels);Wheelchair - manual;Cane - single point;Shower seat;Grab bars - tub/shower      Prior Function Prior Level of Function : Needs assist       Physical Assist : Mobility (physical);ADLs (physical) Mobility (physical): Bed mobility;Transfers;Gait;Stairs   Mobility Comments: household ambulator using RW PRN (most of time) ADLs Comments: assisted by family for household and community ADLs     Hand Dominance        Extremity/Trunk Assessment   Upper Extremity Assessment Upper Extremity Assessment: Overall WFL for tasks assessed    Lower Extremity Assessment Lower Extremity Assessment: Overall WFL for tasks assessed    Cervical / Trunk Assessment Cervical / Trunk Assessment: Kyphotic  Communication   Communication: No difficulties  Cognition Arousal/Alertness: Awake/alert Behavior During Therapy: WFL for tasks assessed/performed Overall Cognitive Status: Within Functional Limits for tasks assessed                                          General Comments      Exercises     Assessment/Plan    PT Assessment All further PT needs can be met in the next venue of care  PT Problem List Decreased strength;Decreased activity tolerance;Decreased balance;Decreased mobility       PT Treatment Interventions      PT Goals (Current goals can be found in the Care Plan section)  Acute Rehab PT Goals Patient Stated Goal: return home with family to assist PT Goal Formulation: With patient/family Time For Goal Achievement: 01/01/22 Potential to Achieve Goals: Good    Frequency       Co-evaluation               AM-PAC PT "6 Clicks" Mobility  Outcome Measure Help needed turning from your back to your side while in a flat bed without using bedrails?: None Help needed moving from lying on your back to sitting on the side  of a flat bed without using bedrails?: None Help needed moving to and from a bed to a chair (including a wheelchair)?: None Help needed standing up from a chair using your arms (e.g., wheelchair or bedside chair)?: A Little Help needed to walk in hospital room?: A Little Help needed climbing 3-5 steps with a railing? : A Little 6 Click Score: 21    End of Session   Activity Tolerance: Patient tolerated treatment well;Patient limited by fatigue Patient left: in chair;with call bell/phone within reach;with family/visitor present Nurse Communication: Mobility status PT Visit Diagnosis: Unsteadiness on feet (R26.81);Other abnormalities of gait and mobility (R26.89);Muscle weakness (generalized) (M62.81)    Time: 7903-8333 PT Time Calculation (min) (ACUTE ONLY): 21 min   Charges:   PT Evaluation $PT Eval Moderate Complexity: 1 Mod PT Treatments $Therapeutic Activity: 8-22 mins        10:26 AM, 01/01/22 Lonell Grandchild, MPT Physical Therapist with [facility] 336 [phone] office [phone] mobile phone

## 2022-01-04 ENCOUNTER — Other Ambulatory Visit (HOSPITAL_COMMUNITY): Payer: Medicare HMO

## 2022-01-05 ENCOUNTER — Other Ambulatory Visit: Payer: Self-pay

## 2022-01-05 ENCOUNTER — Encounter (HOSPITAL_COMMUNITY): Payer: Self-pay | Admitting: Hematology

## 2022-01-05 ENCOUNTER — Inpatient Hospital Stay (HOSPITAL_COMMUNITY): Payer: Medicare HMO

## 2022-01-05 DIAGNOSIS — E860 Dehydration: Secondary | ICD-10-CM | POA: Diagnosis not present

## 2022-01-05 DIAGNOSIS — Z5112 Encounter for antineoplastic immunotherapy: Secondary | ICD-10-CM | POA: Diagnosis not present

## 2022-01-05 DIAGNOSIS — G893 Neoplasm related pain (acute) (chronic): Secondary | ICD-10-CM | POA: Diagnosis not present

## 2022-01-05 DIAGNOSIS — C792 Secondary malignant neoplasm of skin: Secondary | ICD-10-CM | POA: Diagnosis not present

## 2022-01-05 DIAGNOSIS — E876 Hypokalemia: Secondary | ICD-10-CM | POA: Diagnosis not present

## 2022-01-05 DIAGNOSIS — B370 Candidal stomatitis: Secondary | ICD-10-CM | POA: Diagnosis not present

## 2022-01-05 DIAGNOSIS — C7931 Secondary malignant neoplasm of brain: Secondary | ICD-10-CM | POA: Diagnosis not present

## 2022-01-05 DIAGNOSIS — C3431 Malignant neoplasm of lower lobe, right bronchus or lung: Secondary | ICD-10-CM | POA: Diagnosis not present

## 2022-01-05 DIAGNOSIS — C786 Secondary malignant neoplasm of retroperitoneum and peritoneum: Secondary | ICD-10-CM | POA: Diagnosis not present

## 2022-01-05 MED ORDER — SODIUM CHLORIDE 0.9% FLUSH
10.0000 mL | Freq: Once | INTRAVENOUS | Status: AC
  Administered 2022-01-05: 10 mL via INTRAVENOUS

## 2022-01-05 MED ORDER — HEPARIN SOD (PORK) LOCK FLUSH 100 UNIT/ML IV SOLN
500.0000 [IU] | Freq: Once | INTRAVENOUS | Status: AC
Start: 2022-01-05 — End: 2022-01-05
  Administered 2022-01-05: 500 [IU] via INTRAVENOUS

## 2022-01-05 MED ORDER — SODIUM CHLORIDE 0.9 % IV SOLN
Freq: Once | INTRAVENOUS | Status: AC
Start: 2022-01-05 — End: 2022-01-05

## 2022-01-05 NOTE — Progress Notes (Signed)
Patient is receiving Assistance Medication - Supplied Externally. Medication: Keytruda Manufacturer: [employer] Approval Dates: Approved from 12/31/2021 until 11/20/2022. ID: [id number] Reason: EOOP

## 2022-01-05 NOTE — Progress Notes (Signed)
Pt presents today for 1 liter of NS over 2 hours per provider's order. Vitals signs stable and pt voiced no new complaints at this time.  1 liter of NS given today per MD orders. Tolerated infusion without adverse affects. Vital signs stable. No complaints at this time. Discharged from clinic via wheelchair in stable condition. Alert and oriented x 3. F/U with [facility] as scheduled.

## 2022-01-05 NOTE — Patient Instructions (Signed)
[facility]  Discharge Instructions: Thank you for choosing [facility] to provide your oncology and hematology care.  If you have a lab appointment with the [facility], please come in thru the Main Entrance and check in at the main information desk.  Wear comfortable clothing and clothing appropriate for easy access to any Portacath or PICC line.   We strive to give you quality time with your provider. You may need to reschedule your appointment if you arrive late (15 or more minutes).  Arriving late affects you and other patients whose appointments are after yours.  Also, if you miss three or more appointments without notifying the office, you may be dismissed from the clinic at the providers discretion.      For prescription refill requests, have your pharmacy contact our office and allow 72 hours for refills to be completed.    Today you received 1 liter of NS over 2 hours   BELOW ARE SYMPTOMS THAT SHOULD BE REPORTED IMMEDIATELY: *FEVER GREATER THAN 100.4 F (38 C) OR HIGHER *CHILLS OR SWEATING *NAUSEA AND VOMITING THAT IS NOT CONTROLLED WITH YOUR NAUSEA MEDICATION *UNUSUAL SHORTNESS OF BREATH *UNUSUAL BRUISING OR BLEEDING *URINARY PROBLEMS (pain or burning when urinating, or frequent urination) *BOWEL PROBLEMS (unusual diarrhea, constipation, pain near the anus) TENDERNESS IN MOUTH AND THROAT WITH OR WITHOUT PRESENCE OF ULCERS (sore throat, sores in mouth, or a toothache) UNUSUAL RASH, SWELLING OR PAIN  UNUSUAL VAGINAL DISCHARGE OR ITCHING   Items with * indicate a potential emergency and should be followed up as soon as possible or go to the Emergency Department if any problems should occur.  Please show the CHEMOTHERAPY ALERT CARD or IMMUNOTHERAPY ALERT CARD at check-in to the Emergency Department and triage nurse.  Should you have questions after your visit or need to cancel or reschedule your appointment, please contact [facility]  [phone]  and follow the prompts.  Office hours are 8:00 a.m. to 4:30 p.m. Monday - Friday. Please note that voicemails left after 4:00 p.m. may not be returned until the following business day.  We are closed weekends and major holidays. You have access to a nurse at all times for urgent questions. Please call the main number to the clinic [phone] and follow the prompts.  For any non-urgent questions, you may also contact your provider using MyChart. We now offer e-Visits for anyone 87 and older to request care online for non-urgent symptoms. For details visit [email or web address].   Also download the MyChart app! Go to the app store, search "MyChart", open the app, select [facility], and log in with your MyChart username and password.  Due to Covid, a mask is required upon entering the hospital/clinic. If you do not have a mask, one will be given to you upon arrival. For doctor visits, patients may have 1 support person aged 34 or older with them. For treatment visits, patients cannot have anyone with them due to current Covid guidelines and our immunocompromised population.

## 2022-01-06 ENCOUNTER — Ambulatory Visit (HOSPITAL_COMMUNITY): Payer: Medicare HMO

## 2022-01-06 ENCOUNTER — Encounter (HOSPITAL_COMMUNITY): Payer: Medicare HMO | Admitting: Dietician

## 2022-01-06 ENCOUNTER — Ambulatory Visit (HOSPITAL_COMMUNITY): Payer: Medicare HMO | Admitting: Hematology

## 2022-01-06 ENCOUNTER — Ambulatory Visit (HOSPITAL_COMMUNITY): Admission: RE | Admit: 2022-01-06 | Payer: Medicare HMO | Source: Ambulatory Visit

## 2022-01-06 ENCOUNTER — Telehealth (HOSPITAL_COMMUNITY): Payer: Self-pay | Admitting: Dietician

## 2022-01-06 ENCOUNTER — Other Ambulatory Visit (HOSPITAL_COMMUNITY): Payer: Medicare HMO

## 2022-01-06 NOTE — Telephone Encounter (Signed)
Nutrition Follow-up:  Patient with stage IV lung cancer. She is currently receiving Keytruda q21d.  Spoke with patient via telephone. She reports ongoing poor appetite. Lately she has been eating mostly applesauce, creamed potatoes/gravy, chicken broth, yogurt. She is drinking 1-2 Ensure. Patient reports previously drinking 4 Ensure, but "that's when the diarrhea started" Water intake varies, recalls 1-4 bottles/day. Yesterday she ate a banana, drank one Ensure and 1-2 bottles of water. Patient has not tried shake recipes or foods from soft moist high protein foods handout that was provided on 1/23. She denies nausea, vomiting, diarrhea. Patient reports constipation has resolved. She is taking daily stool softener.   Medications: reviewed   Labs: 2/11 - BUN 27  Anthropometrics: Last weight 118 lb on 2/10 stable (? Stated), noted 118 lb on 1/31.   1/23 - 122 lb 14.4 oz  1/19 - 125 lb 6.4 oz   Weights decreased 5.6% in 3 weeks; significant    NUTRITION DIAGNOSIS: Unintentional weight loss ongoing    INTERVENTION:  Reinforced importance of adequate calories and protein to maintain weight/strength Encouraged small bites/sips q2h of high calorie high protein foods - pt has handout Patient agreeable to choosing shake recipe to try  Continue drinking Ensure Plus/equivalent - recommend 4-5 daily  Suggested consuming half of ONS at a time or cutting with milk to improve tolerance Patient has contact information   MONITORING, EVALUATION, GOAL: weight trends, intake    NEXT VISIT: via telephone Monday February 27

## 2022-01-07 ENCOUNTER — Other Ambulatory Visit: Payer: Self-pay | Admitting: Family Medicine

## 2022-01-07 ENCOUNTER — Telehealth: Payer: Self-pay | Admitting: Family Medicine

## 2022-01-07 DIAGNOSIS — E782 Mixed hyperlipidemia: Secondary | ICD-10-CM

## 2022-01-07 NOTE — Telephone Encounter (Signed)
Face to face visit scheduled

## 2022-01-10 ENCOUNTER — Telehealth (INDEPENDENT_AMBULATORY_CARE_PROVIDER_SITE_OTHER): Payer: Medicare HMO | Admitting: Nurse Practitioner

## 2022-01-10 ENCOUNTER — Ambulatory Visit: Payer: Medicare HMO | Admitting: Physician Assistant

## 2022-01-10 ENCOUNTER — Encounter: Payer: Self-pay | Admitting: Nurse Practitioner

## 2022-01-10 DIAGNOSIS — C3490 Malignant neoplasm of unspecified part of unspecified bronchus or lung: Secondary | ICD-10-CM

## 2022-01-10 DIAGNOSIS — Z170 Estrogen receptor positive status [ER+]: Secondary | ICD-10-CM | POA: Diagnosis not present

## 2022-01-10 DIAGNOSIS — C50912 Malignant neoplasm of unspecified site of left female breast: Secondary | ICD-10-CM

## 2022-01-10 DIAGNOSIS — C50911 Malignant neoplasm of unspecified site of right female breast: Secondary | ICD-10-CM | POA: Diagnosis not present

## 2022-01-10 DIAGNOSIS — C50412 Malignant neoplasm of upper-outer quadrant of left female breast: Secondary | ICD-10-CM

## 2022-01-10 DIAGNOSIS — I4892 Unspecified atrial flutter: Secondary | ICD-10-CM

## 2022-01-10 NOTE — Progress Notes (Signed)
Virtual Visit via Video Note   This visit type was conducted due to national recommendations for restrictions regarding the COVID-19 Pandemic (e.g. social distancing) in an effort to limit this patient's exposure and mitigate transmission in our community.  Due to her co-morbid illnesses, this patient is at least at moderate risk for complications without adequate follow up.  This format is felt to be most appropriate for this patient at this time.  All issues noted in this document were discussed and addressed.  A limited physical exam was performed with this format.  A verbal consent was obtained for the virtual visit.   Date:  01/10/2022   ID:  Elizabeth Wu, DOB 10-10-1951, MRN [id number]  Patient Location: Home Provider Location: Office/Clinic  PCP:  [name], DO   Evaluation Performed:  Follow-Up Visit  Chief Complaint: DME needs.     History of Present Illness:    Elizabeth Wu is a 71 y.o. female with Patient was recently diagnosed with stage IV cancer and is needing transportation back and forth to the hospital for treatments, hospital bed for easier access to care and assistance.  The patient does not have symptoms concerning for COVID-19 infection (fever, chills, cough, or new shortness of breath).    Past Medical History:  Diagnosis Date   Allergy    Cancer ([place])    Breast   Complication of anesthesia    DJD (degenerative joint disease)    Hyperlipidemia    Hypertension    Osteopenia    PONV (postoperative nausea and vomiting)     Past Surgical History:  Procedure Laterality Date   APPENDECTOMY     BRONCHIAL BIOPSY  11/11/2021   Procedure: BRONCHIAL BIOPSIES;  Surgeon: Collene Gobble, MD;  Location: [facility] ENDOSCOPY;  Service: Pulmonary;;   BRONCHIAL BRUSHINGS  11/11/2021   Procedure: BRONCHIAL BRUSHINGS;  Surgeon: Collene Gobble, MD;  Location: [facility] ENDOSCOPY;  Service: Pulmonary;;   BRONCHIAL NEEDLE ASPIRATION BIOPSY  11/11/2021   Procedure:  BRONCHIAL NEEDLE ASPIRATION BIOPSIES;  Surgeon: Collene Gobble, MD;  Location: [facility] ENDOSCOPY;  Service: Pulmonary;;   FIDUCIAL MARKER PLACEMENT  11/11/2021   Procedure: FIDUCIAL MARKER PLACEMENT;  Surgeon: Collene Gobble, MD;  Location: [facility] ENDOSCOPY;  Service: Pulmonary;;   HERNIA REPAIR     PORTACATH PLACEMENT Left 12/10/2021   Procedure: INSERTION PORT-A-CATH;  Surgeon: Virl Cagey, MD;  Location: [facility] ORS;  Service: General;  Laterality: Left;   TONSILLECTOMY     TONSILLECTOMY      Family History  Problem Relation Age of Onset   Alzheimer's disease Mother    Cancer Father        stomach cancer   Diabetes Sister    Heart disease Brother        MI    Social History   Socioeconomic History   Marital status: Single    Spouse name: Not on file   Number of children: Not on file   Years of education: Not on file   Highest education level: 12th grade  Occupational History   Occupation: Quality Control  Tobacco Use   Smoking status: Every Day    Packs/day: 0.30    Years: 55.00    Pack years: 16.50    Types: Cigarettes   Smokeless tobacco: Never  Vaping Use   Vaping Use: Never used  Substance and Sexual Activity   Alcohol use: No   Drug use: No   Sexual activity: Not on file  Other  Topics Concern   Not on file  Social History Narrative   Not on file   Social Determinants of Health   Financial Resource Strain: Not on file  Food Insecurity: Not on file  Transportation Needs: Not on file  Physical Activity: Not on file  Stress: Not on file  Social Connections: Not on file  Intimate Partner Violence: Not on file    Outpatient Medications Prior to Visit  Medication Sig Dispense Refill   amiodarone (PACERONE) 200 MG tablet Take 1 tablet (200 mg total) by mouth 2 (two) times daily. 60 tablet 1   anastrozole (ARIMIDEX) 1 MG tablet Take 1 tablet (1 mg total) by mouth daily. 30 tablet 6   bisacodyl (DULCOLAX) 10 MG suppository Place 1 suppository (10 mg total)  rectally as needed for moderate constipation. 12 suppository 0   Cholecalciferol (VITAMIN D) 50 MCG (2000 UT) tablet Take 2,000 Units by mouth daily.     cyanocobalamin (,VITAMIN B-12,) 1000 MCG/ML injection Inject 1,000 mcg into the muscle every 30 (thirty) days.     docusate sodium (COLACE) 100 MG capsule Take 100 mg by mouth 2 (two) times daily.     Ensure (ENSURE) Take 237 mLs by mouth 2 (two) times daily between meals.     folic acid (FOLVITE) 1 MG tablet Take 1 mg by mouth daily.     megestrol (MEGACE) 400 MG/10ML suspension Take 10 mLs (400 mg total) by mouth 2 (two) times daily. 480 mL 3   Misc. Devices MISC Please provide with wheelchair; dx metastatic lung cancer 1 Device 0   Omega-3 Fatty Acids (FISH OIL) 1000 MG CAPS Take 1,000 mg by mouth daily.     Oxycodone HCl 10 MG TABS Take 1 tablet (10 mg total) by mouth every 6 (six) hours as needed. 90 tablet 0   prochlorperazine (COMPAZINE) 10 MG tablet Take 1 tablet (10 mg total) by mouth every 6 (six) hours as needed for nausea or vomiting. (Patient not taking: Reported on 01/05/2022) 30 tablet 0   simvastatin (ZOCOR) 20 MG tablet TAKE 1 TABLET AT BEDTIME 90 tablet 3   No facility-administered medications prior to visit.    Allergies:   Patient has no known allergies.   Social History   Tobacco Use   Smoking status: Every Day    Packs/day: 0.30    Years: 55.00    Pack years: 16.50    Types: Cigarettes   Smokeless tobacco: Never  Vaping Use   Vaping Use: Never used  Substance Use Topics   Alcohol use: No   Drug use: No     Review of Systems  Constitutional: Negative.   HENT: Negative.    Eyes: Negative.   Respiratory: Negative.    Cardiovascular: Negative.   Skin: Negative.   All other systems reviewed and are negative.   Labs/Other Tests and Data Reviewed:    Recent Labs: 12/21/2021: TSH 1.774 12/31/2021: Magnesium 1.7 01/01/2022: ALT 127; BUN 27; Creatinine, Ser 0.59; Hemoglobin 9.9; Platelets 308; Potassium 4.0;  Sodium 136   Recent Lipid Panel Lab Results  Component Value Date/Time   CHOL 191 06/30/2021 10:08 AM   CHOL 266 (H) 05/31/2013 09:27 AM   TRIG 197 (H) 06/30/2021 10:08 AM   TRIG 177 (H) 05/31/2013 09:27 AM   HDL 48 06/30/2021 10:08 AM   HDL 42 05/31/2013 09:27 AM   CHOLHDL 4.0 06/30/2021 10:08 AM   LDLCALC 109 (H) 06/30/2021 10:08 AM   LDLCALC 189 (H) 05/31/2013 09:27 AM  Wt Readings from Last 3 Encounters:  12/31/21 118 lb (53.5 kg)  12/21/21 118 lb (53.5 kg)  12/13/21 122 lb 14.4 oz (55.7 kg)     Objective:    Vital Signs:  There were no vitals taken for this visit.   Physical Exam Vitals and nursing note reviewed.  Constitutional:      Appearance: Normal appearance.  HENT:     Head: Normocephalic.  Eyes:     Conjunctiva/sclera: Conjunctivae normal.  Neurological:     Mental Status: She is alert.    Incomplete physical exam due to virtual visit.   ASSESSMENT & PLAN:   There are no diagnoses linked to this encounter.   No orders of the defined types were placed in this encounter.    No orders of the defined types were placed in this encounter.   COVID-19 Education: The signs and symptoms of COVID-19 were discussed with the patient and how to seek care for testing (follow up with PCP or arrange E-visit). The importance of social distancing was discussed today.  Time:   Today, I have spent 12 minutes with the patient with telehealth technology discussing the above problems.    Follow Up:  Virtual Visit  prn  Signed, Ivy Lynn, NP  01/10/2022 12:52 PM    [place]

## 2022-01-11 ENCOUNTER — Other Ambulatory Visit: Payer: Self-pay

## 2022-01-11 ENCOUNTER — Other Ambulatory Visit (HOSPITAL_COMMUNITY): Payer: Self-pay | Admitting: *Deleted

## 2022-01-11 ENCOUNTER — Inpatient Hospital Stay (HOSPITAL_COMMUNITY): Payer: Medicare HMO

## 2022-01-11 ENCOUNTER — Other Ambulatory Visit (HOSPITAL_COMMUNITY): Payer: Self-pay | Admitting: Physician Assistant

## 2022-01-11 ENCOUNTER — Inpatient Hospital Stay (HOSPITAL_COMMUNITY): Payer: Medicare HMO | Admitting: Hematology

## 2022-01-11 VITALS — BP 122/69 | HR 88 | Temp 97.5°F | Resp 18

## 2022-01-11 DIAGNOSIS — C3490 Malignant neoplasm of unspecified part of unspecified bronchus or lung: Secondary | ICD-10-CM | POA: Diagnosis not present

## 2022-01-11 DIAGNOSIS — C7931 Secondary malignant neoplasm of brain: Secondary | ICD-10-CM | POA: Diagnosis not present

## 2022-01-11 DIAGNOSIS — E860 Dehydration: Secondary | ICD-10-CM | POA: Diagnosis not present

## 2022-01-11 DIAGNOSIS — G893 Neoplasm related pain (acute) (chronic): Secondary | ICD-10-CM

## 2022-01-11 DIAGNOSIS — C50411 Malignant neoplasm of upper-outer quadrant of right female breast: Secondary | ICD-10-CM

## 2022-01-11 DIAGNOSIS — E876 Hypokalemia: Secondary | ICD-10-CM

## 2022-01-11 DIAGNOSIS — C786 Secondary malignant neoplasm of retroperitoneum and peritoneum: Secondary | ICD-10-CM | POA: Diagnosis not present

## 2022-01-11 DIAGNOSIS — B370 Candidal stomatitis: Secondary | ICD-10-CM | POA: Diagnosis not present

## 2022-01-11 DIAGNOSIS — C3431 Malignant neoplasm of lower lobe, right bronchus or lung: Secondary | ICD-10-CM | POA: Diagnosis not present

## 2022-01-11 DIAGNOSIS — C792 Secondary malignant neoplasm of skin: Secondary | ICD-10-CM | POA: Diagnosis not present

## 2022-01-11 DIAGNOSIS — Z5112 Encounter for antineoplastic immunotherapy: Secondary | ICD-10-CM | POA: Diagnosis not present

## 2022-01-11 LAB — COMPREHENSIVE METABOLIC PANEL
ALT: 26 U/L (ref 0–44)
AST: 19 U/L (ref 15–41)
Albumin: 2.7 g/dL — ABNORMAL LOW (ref 3.5–5.0)
Alkaline Phosphatase: 90 U/L (ref 38–126)
Anion gap: 12 (ref 5–15)
BUN: 12 mg/dL (ref 8–23)
CO2: 29 mmol/L (ref 22–32)
Calcium: 8.4 mg/dL — ABNORMAL LOW (ref 8.9–10.3)
Chloride: 95 mmol/L — ABNORMAL LOW (ref 98–111)
Creatinine, Ser: 0.58 mg/dL (ref 0.44–1.00)
GFR, Estimated: >60 mL/min (ref >60)
Glucose, Bld: 95 mg/dL (ref 70–99)
Potassium: 2.5 mmol/L — CL (ref 3.5–5.1)
Sodium: 136 mmol/L (ref 135–145)
Total Bilirubin: 1.7 mg/dL — ABNORMAL HIGH (ref 0.3–1.2)
Total Protein: 6.1 g/dL — ABNORMAL LOW (ref 6.5–8.1)

## 2022-01-11 LAB — CBC WITH DIFFERENTIAL/PLATELET
Abs Immature Granulocytes: 0.09 10*3/uL — ABNORMAL HIGH (ref 0.00–0.07)
Basophils Absolute: 0.1 10*3/uL (ref 0.0–0.1)
Basophils Relative: 0 %
Eosinophils Absolute: 0 10*3/uL (ref 0.0–0.5)
Eosinophils Relative: 0 %
HCT: 32.8 % — ABNORMAL LOW (ref 36.0–46.0)
Hemoglobin: 10.9 g/dL — ABNORMAL LOW (ref 12.0–15.0)
Immature Granulocytes: 1 %
Lymphocytes Relative: 16 %
Lymphs Abs: 2.4 10*3/uL (ref 0.7–4.0)
MCH: 30.7 pg (ref 26.0–34.0)
MCHC: 33.2 g/dL (ref 30.0–36.0)
MCV: 92.4 fL (ref 80.0–100.0)
Monocytes Absolute: 1.1 10*3/uL — ABNORMAL HIGH (ref 0.1–1.0)
Monocytes Relative: 8 %
Neutro Abs: 11.3 10*3/uL — ABNORMAL HIGH (ref 1.7–7.7)
Neutrophils Relative %: 75 %
Platelets: 476 10*3/uL — ABNORMAL HIGH (ref 150–400)
RBC: 3.55 MIL/uL — ABNORMAL LOW (ref 3.87–5.11)
RDW: 13.8 % (ref 11.5–15.5)
WBC: 14.9 10*3/uL — ABNORMAL HIGH (ref 4.0–10.5)
nRBC: 0 % (ref 0.0–0.2)

## 2022-01-11 LAB — MAGNESIUM: Magnesium: 1.6 mg/dL — ABNORMAL LOW (ref 1.7–2.4)

## 2022-01-11 LAB — TSH: TSH: 3.76 u[IU]/mL (ref 0.350–4.500)

## 2022-01-11 MED ORDER — OXYCODONE HCL 10 MG PO TABS: 10.0000 mg | ORAL_TABLET | Freq: Four times a day (QID) | ORAL | 0 refills | Status: DC | PRN

## 2022-01-11 MED ORDER — HEPARIN SOD (PORK) LOCK FLUSH 100 UNIT/ML IV SOLN
500.0000 [IU] | Freq: Once | INTRAVENOUS | Status: AC | PRN
  Administered 2022-01-11: 500 [IU]

## 2022-01-11 MED ORDER — OXYCODONE HCL 5 MG PO TABS
10.0000 mg | ORAL_TABLET | Freq: Once | ORAL | Status: AC
  Administered 2022-01-11: 10 mg via ORAL
  Filled 2022-01-11: qty 2

## 2022-01-11 MED ORDER — POTASSIUM CHLORIDE CRYS ER 20 MEQ PO TBCR: 20.0000 meq | EXTENDED_RELEASE_TABLET | Freq: Two times a day (BID) | ORAL | 6 refills | Status: DC

## 2022-01-11 MED ORDER — POTASSIUM CHLORIDE CRYS ER 20 MEQ PO TBCR: 40.0000 meq | EXTENDED_RELEASE_TABLET | Freq: Two times a day (BID) | ORAL | Status: DC

## 2022-01-11 MED ORDER — SODIUM CHLORIDE 0.9 % IV SOLN: Freq: Once | INTRAVENOUS | Status: AC

## 2022-01-11 MED ORDER — SODIUM CHLORIDE 0.9 % IV SOLN: INTRAVENOUS | Status: DC

## 2022-01-11 MED ORDER — SODIUM CHLORIDE 0.9% FLUSH
10.0000 mL | INTRAVENOUS | Status: DC | PRN
  Administered 2022-01-11: 10 mL

## 2022-01-11 MED ORDER — SODIUM CHLORIDE 0.9 % IV SOLN
200.0000 mg | Freq: Once | INTRAVENOUS | Status: AC
  Administered 2022-01-11: 200 mg via INTRAVENOUS
  Filled 2022-01-11: qty 8

## 2022-01-11 MED ORDER — POTASSIUM CHLORIDE 10 MEQ/100ML IV SOLN
10.0000 meq | INTRAVENOUS | Status: AC | PRN
  Administered 2022-01-11 (×2): 10 meq via INTRAVENOUS
  Filled 2022-01-11 (×2): qty 100

## 2022-01-11 MED ORDER — POTASSIUM CHLORIDE CRYS ER 20 MEQ PO TBCR
40.0000 meq | EXTENDED_RELEASE_TABLET | Freq: Two times a day (BID) | ORAL | Status: DC | PRN
  Administered 2022-01-11: 40 meq via ORAL
  Filled 2022-01-11: qty 2

## 2022-01-11 NOTE — Patient Instructions (Signed)
[facility]  Discharge Instructions: Thank you for choosing [facility] to provide your oncology and hematology care.  If you have a lab appointment with the Cancer Center, please come in thru the Main Entrance and check in at the main information desk.  Wear comfortable clothing and clothing appropriate for easy access to any Portacath or PICC line.   We strive to give you quality time with your provider. You may need to reschedule your appointment if you arrive late (15 or more minutes).  Arriving late affects you and other patients whose appointments are after yours.  Also, if you miss three or more appointments without notifying the office, you may be dismissed from the clinic at the provider's discretion.      For prescription refill requests, have your pharmacy contact our office and allow 72 hours for refills to be completed.        To help prevent nausea and vomiting after your treatment, we encourage you to take your nausea medication as directed.  BELOW ARE SYMPTOMS THAT SHOULD BE REPORTED IMMEDIATELY: *FEVER GREATER THAN 100.4 F (38 C) OR HIGHER *CHILLS OR SWEATING *NAUSEA AND VOMITING THAT IS NOT CONTROLLED WITH YOUR NAUSEA MEDICATION *UNUSUAL SHORTNESS OF BREATH *UNUSUAL BRUISING OR BLEEDING *URINARY PROBLEMS (pain or burning when urinating, or frequent urination) *BOWEL PROBLEMS (unusual diarrhea, constipation, pain near the anus) TENDERNESS IN MOUTH AND THROAT WITH OR WITHOUT PRESENCE OF ULCERS (sore throat, sores in mouth, or a toothache) UNUSUAL RASH, SWELLING OR PAIN  UNUSUAL VAGINAL DISCHARGE OR ITCHING   Items with * indicate a potential emergency and should be followed up as soon as possible or go to the Emergency Department if any problems should occur.  Please show the CHEMOTHERAPY ALERT CARD or IMMUNOTHERAPY ALERT CARD at check-in to the Emergency Department and triage nurse.  Should you have questions after your visit or need to cancel  or reschedule your appointment, please contact [facility] [phone]  and follow the prompts.  Office hours are 8:00 a.m. to 4:30 p.m. Monday - Friday. Please note that voicemails left after 4:00 p.m. may not be returned until the following business day.  We are closed weekends and major holidays. You have access to a nurse at all times for urgent questions. Please call the main number to the clinic [phone] and follow the prompts.  For any non-urgent questions, you may also contact your provider using MyChart. We now offer e-Visits for anyone 18 and older to request care online for non-urgent symptoms. For details visit [email or web address].   Also download the MyChart app! Go to the app store, search "MyChart", open the app, select [place], and log in with your MyChart username and password.  Due to Covid, a mask is required upon entering the hospital/clinic. If you do not have a mask, one will be given to you upon arrival. For doctor visits, patients may have 1 support person aged 18 or older with them. For treatment visits, patients cannot have anyone with them due to current Covid guidelines and our immunocompromised population.  

## 2022-01-11 NOTE — Progress Notes (Signed)
Message received from Dr. [place] Pink. May give patient 10 mg of Oxycodone PO x 1 dose now.

## 2022-01-11 NOTE — Progress Notes (Signed)
Elizabeth Wu, Elizabeth Wu [id number]   CLINIC:  Medical Oncology/Hematology  PCP:  [name], DO [address] 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  PRIOR THERAPY: none  NGS Results: not done  CURRENT THERAPY: Keytruda every 3 weeks  BRIEF ONCOLOGIC HISTORY:  Oncology History  Metastatic primary lung cancer ([place])  12/10/2021 Initial Diagnosis   Primary lung adenocarcinoma ([place])   12/13/2021 Cancer Staging   Staging form: Lung, AJCC 8th Edition - Clinical stage from 12/13/2021: Stage IVB (cT1c, cN0, pM1c) - Signed by Derek Jack, MD on 12/13/2021 Stage prefix: Initial diagnosis    12/21/2021 -  Chemotherapy   Patient is on Treatment Plan : LUNG NSCLC Pembrolizumab (200) q21d       CANCER STAGING:  Cancer Staging  Metastatic primary lung cancer [facility]) Staging form: Lung, AJCC 8th Edition - Clinical stage from 12/13/2021: Stage IVB (cT1c, cN0, pM1c) - Signed by Derek Jack, MD on 12/13/2021   INTERVAL HISTORY:  Elizabeth Wu, a 71 y.o. female, returns for routine follow-up and consideration for next cycle of chemotherapy. Elizabeth Wu was last seen on 12/28/2021.  Due for cycle #2 of Keytruda today.   Overall, she tells me she has been feeling pretty well. She reports currently regular BM. She reports a couple of weeks ago she was constipated and following taking Colace and a suppository she experienced 2 days of diarrhea which has now resolved. She is not currently taking potassium. She stopped taking Megace.  She has lost 4 lbs since 2/10. She has not been eating well; yesterday she reports eating only a couple of spoonfuls of apple sauce. She is taking 1 tablet of Colace every other day. She is drinkign 2 bottles of water daily.   Overall, she feels ready for next cycle of chemo today.   REVIEW OF SYSTEMS:  Review of Systems  Constitutional:   Positive for unexpected weight change (-4 lbs). Negative for appetite change and fatigue.  Respiratory:  Positive for cough.   Gastrointestinal:  Positive for nausea. Negative for constipation (resolved) and diarrhea (resolved).  Musculoskeletal:  Positive for arthralgias (6/10 legs) and myalgias (6/10).  Psychiatric/Behavioral:  Positive for sleep disturbance.   All other systems reviewed and are negative.  PAST MEDICAL/SURGICAL HISTORY:  Past Medical History:  Diagnosis Date   Allergy    Cancer ([place])    Breast   Complication of anesthesia    DJD (degenerative joint disease)    Hyperlipidemia    Hypertension    Osteopenia    PONV (postoperative nausea and vomiting)    Past Surgical History:  Procedure Laterality Date   APPENDECTOMY     BRONCHIAL BIOPSY  11/11/2021   Procedure: BRONCHIAL BIOPSIES;  Surgeon: Collene Gobble, MD;  Location: [facility] ENDOSCOPY;  Service: Pulmonary;;   BRONCHIAL BRUSHINGS  11/11/2021   Procedure: BRONCHIAL BRUSHINGS;  Surgeon: Collene Gobble, MD;  Location: [place];  Service: Pulmonary;;   BRONCHIAL NEEDLE ASPIRATION BIOPSY  11/11/2021   Procedure: BRONCHIAL NEEDLE ASPIRATION BIOPSIES;  Surgeon: Collene Gobble, MD;  Location: [facility] ENDOSCOPY;  Service: Pulmonary;;   FIDUCIAL MARKER PLACEMENT  11/11/2021   Procedure: FIDUCIAL MARKER PLACEMENT;  Surgeon: Collene Gobble, MD;  [facility] ENDOSCOPY;  Service: Pulmonary;;   HERNIA REPAIR     PORTACATH PLACEMENT Left 12/10/2021   Procedure: INSERTION PORT-A-CATH;  Surgeon: Virl Cagey, MD;  Location:  [facility];  Service: General;  Laterality: Left;   TONSILLECTOMY     TONSILLECTOMY      SOCIAL HISTORY:  Social History   Socioeconomic History   Marital status: Single    Spouse name: Not on file   Number of children: Not on file   Years of education: Not on file   Highest education level: 12th grade  Occupational History   Occupation: Quality Control  Tobacco Use   Smoking status: Every Day     Packs/day: 0.30    Years: 55.00    Pack years: 16.50    Types: Cigarettes   Smokeless tobacco: Never  Vaping Use   Vaping Use: Never used  Substance and Sexual Activity   Alcohol use: No   Drug use: No   Sexual activity: Not on file  Other Topics Concern   Not on file  Social History Narrative   Not on file   Social Determinants of Health   Financial Resource Strain: Not on file  Food Insecurity: Not on file  Transportation Needs: Not on file  Physical Activity: Not on file  Stress: Not on file  Social Connections: Not on file  Intimate Partner Violence: Not on file    FAMILY HISTORY:  Family History  Problem Relation Age of Onset   Alzheimer's disease Mother    Cancer Father        stomach cancer   Diabetes Sister    Heart disease Brother        MI    CURRENT MEDICATIONS:  Current Outpatient Medications  Medication Sig Dispense Refill   amiodarone (PACERONE) 200 MG tablet Take 1 tablet (200 mg total) by mouth 2 (two) times daily. 60 tablet 1   anastrozole (ARIMIDEX) 1 MG tablet Take 1 tablet (1 mg total) by mouth daily. 30 tablet 6   bisacodyl (DULCOLAX) 10 MG suppository Place 1 suppository (10 mg total) rectally as needed for moderate constipation. 12 suppository 0   Cholecalciferol (VITAMIN D) 50 MCG (2000 UT) tablet Take 2,000 Units by mouth daily.     cyanocobalamin (,VITAMIN B-12,) 1000 MCG/ML injection Inject 1,000 mcg into the muscle every 30 (thirty) days.     docusate sodium (COLACE) 100 MG capsule Take 100 mg by mouth 2 (two) times daily.     Ensure (ENSURE) Take 237 mLs by mouth 2 (two) times daily between meals.     folic acid (FOLVITE) 1 MG tablet Take 1 mg by mouth daily.     IBU 600 MG tablet Take by mouth.     megestrol (MEGACE) 400 MG/10ML suspension Take 10 mLs (400 mg total) by mouth 2 (two) times daily. 480 mL 3   Misc. Devices MISC Please provide with wheelchair; dx metastatic lung cancer 1 Device 0   Omega-3 Fatty Acids (FISH OIL) 1000  MG CAPS Take 1,000 mg by mouth daily.     Oxycodone HCl 10 MG TABS Take 1 tablet (10 mg total) by mouth every 6 (six) hours as needed. 90 tablet 0   prochlorperazine (COMPAZINE) 10 MG tablet Take 1 tablet (10 mg total) by mouth every 6 (six) hours as needed for nausea or vomiting. 30 tablet 0   simvastatin (ZOCOR) 20 MG tablet TAKE 1 TABLET AT BEDTIME 90 tablet 3   No current facility-administered medications for this visit.    ALLERGIES:  No Known Allergies  PHYSICAL EXAM:  Performance status (ECOG): 1 - Symptomatic but completely ambulatory  There were no vitals filed for this  visit. Wt Readings from Last 3 Encounters:  01/11/22 114 lb 6.4 oz (51.9 kg)  12/31/21 118 lb (53.5 kg)  12/21/21 118 lb (53.5 kg)   Physical Exam Vitals reviewed.  Constitutional:      Appearance: Normal appearance.  Cardiovascular:     Rate and Rhythm: Normal rate and regular rhythm.     Pulses: Normal pulses.     Heart sounds: Normal heart sounds.  Pulmonary:     Effort: Pulmonary effort is normal.     Breath sounds: Normal breath sounds.  Musculoskeletal:     Right lower leg: No edema.     Left lower leg: No edema.  Neurological:     General: No focal deficit present.     Mental Status: She is alert and oriented to person, place, and time.  Psychiatric:        Mood and Affect: Mood normal.        Behavior: Behavior normal.    LABORATORY DATA:  I have reviewed the labs as listed.  CBC Latest Ref Rng & Units 01/11/2022 01/01/2022 12/31/2021  WBC 4.0 - 10.5 K/uL 14.9(H) 8.0 12.7(H)  Hemoglobin 12.0 - 15.0 g/dL 10.9(L) 9.9(L) 12.3  Hematocrit 36.0 - 46.0 % 32.8(L) 30.3(L) 36.0  Platelets 150 - 400 K/uL 476(H) 308 433(H)   CMP Latest Ref Rng & Units 01/11/2022 01/01/2022 12/31/2021  Glucose 70 - 99 mg/dL 95 92 174(H)  BUN 8 - 23 mg/dL 12 27(H) 34(H)  Creatinine 0.44 - 1.00 mg/dL 0.58 0.59 0.88  Sodium 135 - 145 mmol/L 136 136 135  Potassium 3.5 - 5.1 mmol/L 2.5(LL) 4.0 2.8(L)  Chloride 98 -  111 mmol/L 95(L) 109 103  CO2 22 - 32 mmol/L 29 20(L) 21(L)  Calcium 8.9 - 10.3 mg/dL 8.4(L) 8.0(L) 9.1  Total Protein 6.5 - 8.1 g/dL 6.1(L) 5.2(L) 6.3(L)  Total Bilirubin 0.3 - 1.2 mg/dL 1.7(H) 1.1 1.9(H)  Alkaline Phos 38 - 126 U/L 90 51 60  AST 15 - 41 U/L 19 58(H) 73(H)  ALT 0 - 44 U/L 26 127(H) 131(H)    DIAGNOSTIC IMAGING:  I have independently reviewed the scans and discussed with the patient. DG OP Swallowing Func-Medicare/Speech Path  Result Date: 12/20/2021 Table formatting from the original result was not included. [place], [place], [address] Phone: [phone]   Fax:  [phone]   Modified Barium Swallow   Patient Details Name: Elizabeth Wu MRN: [id number] Date of Birth: 1951/06/05 No data recorded   Encounter Date: 12/20/2021     End of Session - 12/20/21 1226      Visit Number 1    Number of Visits 1    Authorization Type [employer]    SLP Start Time 1132    SLP Stop Time  1205    SLP Time Calculation (min) 33 min    Activity Tolerance Patient tolerated treatment well                  Past Medical History: Diagnosis Date  Allergy    Cancer ([place])     Breast  Complication of anesthesia    DJD (degenerative joint disease)    Hyperlipidemia    Hypertension    Osteopenia    PONV (postoperative nausea and vomiting)           Past Surgical History: Procedure Laterality Date  APPENDECTOMY      BRONCHIAL BIOPSY   11/11/2021   Procedure: BRONCHIAL  BIOPSIES;  Surgeon: Collene Gobble, MD;  Location: [facility] ENDOSCOPY;  Service: Pulmonary;;  BRONCHIAL BRUSHINGS   11/11/2021   Procedure: BRONCHIAL BRUSHINGS;  Surgeon: Collene Gobble, MD;  Location: [facility] ENDOSCOPY;  Service: Pulmonary;;  BRONCHIAL NEEDLE ASPIRATION BIOPSY   11/11/2021   Procedure: BRONCHIAL NEEDLE ASPIRATION BIOPSIES;  Surgeon: Collene Gobble, MD;  Location: [facility] ENDOSCOPY;  Service: Pulmonary;;  FIDUCIAL MARKER PLACEMENT   11/11/2021   Procedure: FIDUCIAL MARKER  PLACEMENT;  Surgeon: Collene Gobble, MD;  [facility] ENDOSCOPY;  Service: Pulmonary;;  HERNIA REPAIR      PORTACATH PLACEMENT Left 12/10/2021   Procedure: INSERTION PORT-A-CATH;  Surgeon: Virl Cagey, MD;  Location: [facility];  Service: General;  Laterality: Left;  TONSILLECTOMY      TONSILLECTOMY         There were no vitals filed for this visit.     Subjective Assessment - 12/20/21 1218      Subjective "I just don't have an appetite."    Special Tests MBSS    Currently in Pain? No/denies               ?  ?      General - 12/20/21 1219             General Information   Date of Onset 12/15/21    HPI Ms. MAKAILA WINDLE is a 71 y.o. female who was referred for MBSS by Dr. Berniece Salines. 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 She is currently receiving anastrozole. She is pending start of immunotherapy with Keytruda on 1/26. RD reports: Patient is taking appetite stimulant (Megace). She is drinking 2 Ensure Plus daily. Patient endorses dysphagia, states her "last real meal was 09/28" Patient reports difficulty with regular textures for "the longest time but managed." This has worsened in the last few months, now limited to foods that "slide down easy" recalling jello, oatmeal, pudding, mashed potatoes, soups, hard boiled eggs.    Type of Study MBS-Modified Barium Swallow Study    Diet Prior to this Study Regular;Thin liquids    Temperature Spikes Noted No    Respiratory Status Room air    History of Recent Intubation No    Behavior/Cognition Alert;Cooperative;Pleasant mood    Oral Cavity Assessment Within Functional Limits    Oral Care Completed by SLP No    Oral Cavity - Dentition Edentulous    Vision Functional for self feeding    Self-Feeding Abilities Able to feed self    Patient Positioning Upright in chair    Baseline Vocal Quality Normal    Volitional Cough Strong    Volitional Swallow Able to elicit    Anatomy Within functional limits    Pharyngeal  Secretions Not observed secondary MBS               ?      Oral Preparation/Oral Phase - 12/20/21 1220             Oral Preparation/Oral Phase   Oral Phase Impaired        Oral - Thin   Oral - Thin Teaspoon Within functional limits    Oral - Thin Cup Within functional limits    Oral - Thin Straw Within functional limits        Oral - Solids   Oral - Puree Within functional limits    Oral - Regular Piecemeal swallowing;Imparied mastication;Delayed A-P transit    Oral - Pill  Within functional limits        Electrical stimulation - Oral Phase   Was Electrical Stimulation Used No                 Pharyngeal Phase - 12/20/21 1221             Pharyngeal Phase   Pharyngeal Phase Within functional limits        Electrical Stimulation - Pharyngeal Phase   Was Electrical Stimulation Used No                 Cricopharyngeal Phase - 12/20/21 1221             Cervical Esophageal Phase   Cervical Esophageal Phase Impaired        Cervical Esophageal Phase - Comment   Other Esophageal Phase Observations delayed emptying in esophagus with solids and barium tablet, but did eventually clear               ?    Plan - 12/20/21 1227      Clinical Impression Statement MBSS completed this date. Pt presents with mild oral phase dysphagia, WNL pharyngeal phase dysphagia, and suspected primary esophageal dysphagia. 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 Pt with timely swallow initiation, hyolaryngeal excursion, no penetration/aspiration, or significant residuals. Esophageal sweep reveals delayed emptying with solids and barium tablet, but did eventually clear with puree and liquid wash. Pt initially stated she did not have any difficulty swallowing, but stated that she didn't have an appetite. After discussing with Pt's sister, she stated that she had some trouble with solids and felt they were more difficult to  masticate. Given that Pt is edentulous and likely with xerostomia, she may benefit from adding moisture to solids and cut up solids into small pieces. Recommend self regulated regular textures and thin liquids with reflux precautions (provided in written form to Pt). No further SLP services indicated at this time.    Consulted and Agree with Plan of Care Patient               Patient will benefit from skilled therapeutic intervention in order to improve the following deficits and impairments:  Dysphagia, oropharyngeal phase   ?      Recommendations/Treatment - 12/20/21 1225             Swallow Evaluation Recommendations   Recommended Consults Consider esophageal assessment    SLP Diet Recommendations Age appropriate regular;Thin    Liquid Administration via Cup;Straw    Medication Administration Whole meds with liquid    Supervision Patient able to self feed    Postural Changes Seated upright at 90 degrees;Remain upright for at least 30 minutes after feeds/meals                 Prognosis - 12/20/21 1226             Prognosis   Prognosis for Safe Diet Advancement Good        Individuals Consulted   Consulted and Agree with Results and Recommendations Patient    Report Sent to  Referring physician               Problem List    Patient Active Problem List   Diagnosis Date Noted  Primary lung adenocarcinoma ([place]) 12/10/2021  Atrial flutter ([place]) 12/09/2021  Pulmonary nodules 11/05/2021  Malignant neoplasm of upper-outer quadrant  of right female breast ([place]) 09/30/2021  Malignant neoplasm of upper-inner quadrant of left female breast ([place]) 09/30/2021  Malignant neoplasm of upper-outer quadrant of left female breast ([place]) 09/30/2021  Breast lump in female 08/06/2021  Pain in right hip 06/03/2021  Pelvic fracture ([place]) 06/03/2021  Viral upper respiratory tract infection 04/14/2021  Chronic constipation 05/20/2020  Hypertension 05/29/2019  Cholelithiases 09/13/2016  Bursitis, hip 04/05/2016  Healthcare maintenance  11/04/2015  Meralgia paraesthetica 04/22/2014  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 Cough/GE reflux disease/other secondary diagnosis EXAM: MODIFIED BARIUM SWALLOW TECHNIQUE: Different consistencies of barium were administered orally to the patient by the Speech Pathologist. Imaging of the pharynx was performed in the lateral projection. FLUOROSCOPY TIME:  Fluoroscopy Time:  1 minutes 6 seconds Radiation Exposure Index (if provided by the fluoroscopic device): 39.2 mGy Number of Acquired Spot Images: 0 COMPARISON:  None. FINDINGS: Modified barium swallow was performed by the speech pathologist. Negative for aspiration/penetration. Please refer to the Speech Pathology report for results and recommendations. IMPRESSION: Please refer to the Speech Pathologists report for complete details and recommendations. Electronically Signed   By: Corrie Mckusick D.O.   On: 12/20/2021 12:18   DG Chest Port 1 View  Result Date: 12/31/2021 CLINICAL DATA:  Tachycardia with weakness.  History of hypertension. EXAM: PORTABLE CHEST 1 VIEW COMPARISON:  Radiographs 12/10/2021 and 12/06/2021.  CT 12/06/2021. FINDINGS: 1012 hours. Left subclavian Port-A-Cath tip projects over the lower SVC, unchanged. The heart size and mediastinal contours are stable. There is aortic atherosclerosis. Extensive reticulonodular densities throughout both lungs are grossly stable. The dominant cavitary lesion in the right lower lobe on CT projects over the right hilum and is not well visualized. No superimposed airspace  disease, pleural effusion or pneumothorax identified. The bones appear unchanged. Telemetry leads overlie the chest. IMPRESSION: Grossly stable radiographic appearance of the chest with diffuse reticulonodular densities corresponding with probable metastatic disease superimposed on emphysema. No apparent acute superimposed process. Electronically Signed   By: Richardean Sale M.D.   On: 12/31/2021 10:29     ASSESSMENT:  Bilateral breast cancer: - Screening mammogram on 08/24/2021 with suspicious 0.9 cm mass in the left breast at 10 o'clock position, 1.3 cm mass at the 12 o'clock position.  Suspicious 0.7 cm mass in the right breast at 10 o'clock position. - Biopsy of the left breast 12:00 and 10 o'clock position consistent with invasive ductal carcinoma, grade 3.  Tumor cells are negative for HER2.  ER is 10% positive with weak staining.  PR is 5% positive with moderate staining.  Ki-67 is 15%.  Tissue for this case was left in formalin for 72 hours, therefore repeat prognostic panel was recommended on excision specimen. - Biopsy of the right breast 10 o'clock position shows invasive lobular carcinoma, ER 60% positive, PR 80% positive, Ki-67 2%, HER2 2+.  HER2 FISH pending. - PET scan on 10/21/2021 with a right lower lobe lung spiculated mass measuring 2.3 x 3.4 cm with SUV 8.1.  Hypermetabolic right middle lobe lung nodule measuring 9 mm with SUV 2.7.  1.5 x 2.1 cm left breast  upper outer nodule SUV 2.8.  12 mm nodule in the supra-areolar medial left breast SUV 3.1.  Subcutaneous soft tissue nodule overlying the posterior lower left ribs measuring 1.4 cm with SUV 2.8.  10 mm left omental nodule.  Soft tissue nodule in the left pericolic mesentery measures 7 mm with SUV 1.8.  Soft tissue nodule posterior to the left psoas muscle measures 9 mm with SUV 2.1. - MRI of the lumbar spine on 09/14/2021 with 1 cm foci of low T1 and high T2 signal within L5 and S1 vertebral bodies, nonspecific, could be marrow space  metastatic disease. - 25 pound weight loss since the beginning of this year due to decreased appetite and eating. - Lower back and right hip pain since February/March of this year on and off. - Soft tissue nodule on the back biopsy on 11/09/2021 shows metastatic poorly differentiated carcinoma, CK7 and TTF-1 positive, p40 negative.  Negative for CK20, GATA3, SOX10. - Bronchoscopy and biopsy of the right lower lobe and right upper lobe consistent with adenocarcinoma, TTF-1 and Napsin patchy positive, negative for p40. - NGS: PD-L1 TPS 75% (22 C3).  Nontargetable ATM and K-ras G12V mutation present.    Social/family history: - She lives at home with her friend.  She is retired and worked in Architect, as a Artist, and Corporate investment banker.  She is current active smoker, half to 1 pack/day for 50 to 60 years. - Father had stomach cancer.  Brother had lung cancer.   PLAN:  Metastatic adenocarcinoma of the right lung to the skin and omentum, PD-L1 TPS 75%: - Single agent pembrolizumab started on 12/28/2021. - She did not have any immunotherapy related side effects. - She was evaluated in the ER for A-fib with RVR and was found to have hypokalemia.  I have reviewed records. - She will proceed with her cycle 2 treatment today. - She will be evaluated in our symptom management clinic next week.  I plan to see her back in 3 weeks prior to cycle 3.  2.  Bilateral breast cancer: - Continue anastrozole daily.  She is tolerating well.  3.  Low back pain and right hip pain: - Continue oxycodone 10 mg every 6 hours as needed.  4.  Weight loss/loss of appetite: - She is not taking Megace. - She is drinking 2 ensures per day and eating small meals per day.  She has lost about 4 pounds since last treatment 3 weeks ago. - Recommend increasing ensures.  5.  Constipation: - She is taking Colace every other day.  I have told her to increase Colace to 1 to 2 tablets daily based on bowel  movements.  Her last BM was last week.  6.  Brain metastasis: - 4 mm nodular enhancing focus along the left posterior central gyrus was seen on the last MRI.  We will plan to repeat MRI of the brain in first week of April.  7.  Hypokalemia: - She has severe hypokalemia with potassium 2.5 today. - Magnesium is low at 1.6. - We will replete her potassium and magnesium in the office today.  We will start her on potassium 20 mEq twice daily.   Orders placed this encounter:  Orders Placed This Encounter  Procedures   [place], MD [place] [phone]   I, Thana Ates, am acting as a scribe for Dr. Derek Jack.  Kinnie Scales MD, have reviewed  the above documentation for accuracy and completeness, and I agree with the above.

## 2022-01-11 NOTE — Patient Instructions (Addendum)
[place] at [facility] Discharge Instructions   You were seen and examined today by Dr. Delton Coombes.  He reviewed the results of your lab work.  Your potassium is critically low at 2.5.  We will supplement your potassium today. We will also send a prescription for potassium.   We will proceed with your treatment today.  Start taking Colace (stool softener) every day.  Return as scheduled in 3 weeks.      Thank you for choosing [place] at [facility] to provide your oncology and hematology care.  To afford each patient quality time with our provider, please arrive at least 15 minutes before your scheduled appointment time.   If you have a lab appointment with the [facility] please come in thru the Main Entrance and check in at the main information desk.  You need to re-schedule your appointment should you arrive 10 or more minutes late.  We strive to give you quality time with our providers, and arriving late affects you and other patients whose appointments are after yours.  Also, if you no show three or more times for appointments you may be dismissed from the clinic at the providers discretion.     Again, thank you for choosing [facility].  Our hope is that these requests will decrease the amount of time that you wait before being seen by our physicians.       _____________________________________________________________  Should you have questions after your visit to [facility], please contact our office at [phone] and follow the prompts.  Our office hours are 8:00 a.m. and 4:30 p.m. Monday - Friday.  Please note that voicemails left after 4:00 p.m. may not be returned until the following business day.  We are closed weekends and major holidays.  You do have access to a nurse 24-7, just call the main number to the clinic [phone] and do not press any options, hold on the line and a nurse will  answer the phone.    For prescription refill requests, have your pharmacy contact our office and allow 72 hours.    Due to Covid, you will need to wear a mask upon entering the hospital. If you do not have a mask, a mask will be given to you at the Main Entrance upon arrival. For doctor visits, patients may have 1 support person age 34 or older with them. For treatment visits, patients can not have anyone with them due to social distancing guidelines and our immunocompromised population.

## 2022-01-11 NOTE — Progress Notes (Signed)
Patient has been examined by Dr. Katragadda, and vital signs and labs have been reviewed. ANC, Creatinine, LFTs, hemoglobin, and platelets are within treatment parameters per M.D. - pt may proceed with treatment.    °

## 2022-01-11 NOTE — Progress Notes (Signed)
Patient tolerated therapy with no complaints voiced.  Side effects with management reviewed with understanding verbalized.  Port site clean and dry with no bruising or swelling noted at site.  Good blood return noted before and after administration of therapy.  Band aid applied.  Patient left in satisfactory condition with VSS and no s/s of distress noted.

## 2022-01-11 NOTE — Progress Notes (Signed)
CRITICAL VALUE ALERT Critical value received:  K+ 2.5 Date of notification:  01-11-22 Time of notification: 5790 Critical value read back:  Yes.   Nurse who received alert:  C. Coutney Wildermuth RN MD notified time and response:  3833, will give potasium per orders per MD.

## 2022-01-11 NOTE — Progress Notes (Signed)
Patients port flushed without difficulty.  Good blood return noted with no bruising or swelling noted at site.  Stable during access and blood draw.  Patient to remain accessed for treatment. 

## 2022-01-12 ENCOUNTER — Other Ambulatory Visit (HOSPITAL_COMMUNITY): Payer: Self-pay | Admitting: *Deleted

## 2022-01-13 ENCOUNTER — Telehealth (HOSPITAL_COMMUNITY): Payer: Self-pay

## 2022-01-13 ENCOUNTER — Other Ambulatory Visit (HOSPITAL_COMMUNITY): Payer: Self-pay

## 2022-01-13 NOTE — Telephone Encounter (Signed)
Patient called triage line pertaining to pain medication ordered does not help. Oxycodone 10 mg / 325 mg Acetaminophen ordered by R. Pennington PA . Spoke to R. Pennington PA and informed her of patient's concerns. Will return call to patient .

## 2022-01-13 NOTE — Telephone Encounter (Signed)
Patient called the triage line in regards to her new pain medication prescribed by R. Pennington PA. Patient requests pain medication to be changed back to what she was taking prior to what the pharmacy gave her yesterday. Patient states she is in pain and the medication prescribed does not work.

## 2022-01-13 NOTE — Telephone Encounter (Signed)
Called and spoke with Elizabeth Wu about her concerns of the Oxycodone 10 mg / 325 Acetaminophen being the same dosage as Oxycodone 10 mg. Patient teaching performed. Instructed patient to take her pain medication as prescribed every 4 hours. Patient states the Oxycodone 10 mg works better to control her pain. Understanding verbalized pertaining to how to take pain medications.

## 2022-01-13 NOTE — Telephone Encounter (Signed)
Patient called and is requesting a different pain medication. Patient prescribed Oxycodone 10 mg /325 mg Acetaminophen PO Q 4hrs for pain. Patient states it does not work for her and she was in pain all night. Patient requesting Norco/Vicodin 10 mg. Spoke to R. Pennington PA who prescribed the Oxycodone. Will return call to patient.

## 2022-01-17 ENCOUNTER — Other Ambulatory Visit: Payer: Self-pay

## 2022-01-17 ENCOUNTER — Inpatient Hospital Stay (HOSPITAL_COMMUNITY): Payer: Medicare HMO | Admitting: Dietician

## 2022-01-17 NOTE — Progress Notes (Signed)
Nutrition Follow-up:  Patient currently receiving Keytruda q12d for stage IV lung cancer.   Spoke with patient and sister via telephone. Patient reports ongoing pain. She is taking pain medication as prescribed. Patient reports continued poor appetite. She has not eaten today because "she hurts." Patient reports she had a few bites of applesauce, one Ensure, and 2 bottles of water yesterday. Sister reports she did not eat yesterday besides a couple tablespoons of applesauce to take medications. Per sister, last intake was on Friday (2/24) when patient had a few bites of BBQ ribs, pintos, mac/cheese. Patient reports ongoing constipation, she has not had a bowel movement "in a week and a half." Patient is taking colace twice daily. Patient denies abdominal pain, nausea, vomiting.   Medications: Percocet, Klor-con  Labs: 2/21 - potassium 2.5 (s/p IV repletion)   Anthropometrics: Last weight 114 lb 6.4 oz on 2/21 decreased 3.4% (4 lbs) in 11 days; significant  2/10 - 118 lb 1/31 - 118 lb 1/23 - 122 lb 14.4 oz 1/19 - 125 lb 6.4 oz    NUTRITION DIAGNOSIS: Unintentional weight loss ongoing    INTERVENTION:  Continue to educate on small bites/sips q2h of high calorie high protein foods Patient has shake recipes - she has not tried these Patient will work to increase intake of Ensure - recommend 4-5/day Will leave one complimentary case of Ensure Plus High Protein at registration desk for pick up on 3/1 - sister aware Patient does not want feeding tube  Continue taking Colace for constipation - no BM in last week and a half, message sent to provider   MONITORING, EVALUATION, GOAL: weight trends, intake    NEXT VISIT: via telephone ~ 3 weeks

## 2022-01-18 ENCOUNTER — Inpatient Hospital Stay (HOSPITAL_COMMUNITY)
Admission: EM | Admit: 2022-01-18 | Discharge: 2022-01-20 | DRG: 264 | Disposition: A | Discharge status: Hospice | Payer: Medicare HMO | Attending: Family Medicine | Admitting: Family Medicine

## 2022-01-18 ENCOUNTER — Ambulatory Visit: Payer: Medicare HMO

## 2022-01-18 ENCOUNTER — Other Ambulatory Visit: Payer: Self-pay

## 2022-01-18 ENCOUNTER — Other Ambulatory Visit (HOSPITAL_COMMUNITY): Payer: Self-pay | Admitting: *Deleted

## 2022-01-18 ENCOUNTER — Inpatient Hospital Stay (HOSPITAL_COMMUNITY): Payer: Medicare HMO

## 2022-01-18 ENCOUNTER — Encounter (HOSPITAL_COMMUNITY): Payer: Self-pay

## 2022-01-18 ENCOUNTER — Emergency Department (HOSPITAL_COMMUNITY): Payer: Medicare HMO

## 2022-01-18 DIAGNOSIS — N179 Acute kidney failure, unspecified: Secondary | ICD-10-CM | POA: Diagnosis present

## 2022-01-18 DIAGNOSIS — R000 Tachycardia, unspecified: Secondary | ICD-10-CM | POA: Diagnosis not present

## 2022-01-18 DIAGNOSIS — C50412 Malignant neoplasm of upper-outer quadrant of left female breast: Secondary | ICD-10-CM | POA: Diagnosis present

## 2022-01-18 DIAGNOSIS — I4891 Unspecified atrial fibrillation: Secondary | ICD-10-CM | POA: Diagnosis not present

## 2022-01-18 DIAGNOSIS — R531 Weakness: Secondary | ICD-10-CM | POA: Diagnosis not present

## 2022-01-18 DIAGNOSIS — L89151 Pressure ulcer of sacral region, stage 1: Secondary | ICD-10-CM | POA: Diagnosis present

## 2022-01-18 DIAGNOSIS — Z79899 Other long term (current) drug therapy: Secondary | ICD-10-CM

## 2022-01-18 DIAGNOSIS — L89314 Pressure ulcer of right buttock, stage 4: Secondary | ICD-10-CM | POA: Diagnosis present

## 2022-01-18 DIAGNOSIS — E875 Hyperkalemia: Secondary | ICD-10-CM | POA: Diagnosis present

## 2022-01-18 DIAGNOSIS — Z20822 Contact with and (suspected) exposure to covid-19: Secondary | ICD-10-CM | POA: Diagnosis present

## 2022-01-18 DIAGNOSIS — Z79811 Long term (current) use of aromatase inhibitors: Secondary | ICD-10-CM | POA: Diagnosis not present

## 2022-01-18 DIAGNOSIS — I10 Essential (primary) hypertension: Secondary | ICD-10-CM | POA: Diagnosis present

## 2022-01-18 DIAGNOSIS — I4892 Unspecified atrial flutter: Secondary | ICD-10-CM | POA: Diagnosis present

## 2022-01-18 DIAGNOSIS — I480 Paroxysmal atrial fibrillation: Secondary | ICD-10-CM | POA: Diagnosis present

## 2022-01-18 DIAGNOSIS — Z7189 Other specified counseling: Secondary | ICD-10-CM | POA: Diagnosis not present

## 2022-01-18 DIAGNOSIS — M1990 Unspecified osteoarthritis, unspecified site: Secondary | ICD-10-CM | POA: Diagnosis present

## 2022-01-18 DIAGNOSIS — Z8249 Family history of ischemic heart disease and other diseases of the circulatory system: Secondary | ICD-10-CM | POA: Diagnosis not present

## 2022-01-18 DIAGNOSIS — C3490 Malignant neoplasm of unspecified part of unspecified bronchus or lung: Secondary | ICD-10-CM

## 2022-01-18 DIAGNOSIS — Z515 Encounter for palliative care: Secondary | ICD-10-CM

## 2022-01-18 DIAGNOSIS — E860 Dehydration: Secondary | ICD-10-CM | POA: Diagnosis present

## 2022-01-18 DIAGNOSIS — E785 Hyperlipidemia, unspecified: Secondary | ICD-10-CM | POA: Diagnosis present

## 2022-01-18 DIAGNOSIS — C3491 Malignant neoplasm of unspecified part of right bronchus or lung: Secondary | ICD-10-CM | POA: Diagnosis present

## 2022-01-18 DIAGNOSIS — C50212 Malignant neoplasm of upper-inner quadrant of left female breast: Secondary | ICD-10-CM | POA: Diagnosis present

## 2022-01-18 DIAGNOSIS — Z79818 Long term (current) use of other agents affecting estrogen receptors and estrogen levels: Secondary | ICD-10-CM

## 2022-01-18 DIAGNOSIS — C50411 Malignant neoplasm of upper-outer quadrant of right female breast: Secondary | ICD-10-CM

## 2022-01-18 DIAGNOSIS — R54 Age-related physical debility: Secondary | ICD-10-CM | POA: Diagnosis present

## 2022-01-18 DIAGNOSIS — C7951 Secondary malignant neoplasm of bone: Secondary | ICD-10-CM | POA: Diagnosis present

## 2022-01-18 DIAGNOSIS — C7931 Secondary malignant neoplasm of brain: Secondary | ICD-10-CM | POA: Diagnosis present

## 2022-01-18 DIAGNOSIS — F17210 Nicotine dependence, cigarettes, uncomplicated: Secondary | ICD-10-CM | POA: Diagnosis present

## 2022-01-18 DIAGNOSIS — Z853 Personal history of malignant neoplasm of breast: Secondary | ICD-10-CM | POA: Diagnosis not present

## 2022-01-18 DIAGNOSIS — S31819A Unspecified open wound of right buttock, initial encounter: Secondary | ICD-10-CM

## 2022-01-18 DIAGNOSIS — Z66 Do not resuscitate: Secondary | ICD-10-CM | POA: Diagnosis present

## 2022-01-18 DIAGNOSIS — R072 Precordial pain: Secondary | ICD-10-CM | POA: Diagnosis not present

## 2022-01-18 DIAGNOSIS — C7800 Secondary malignant neoplasm of unspecified lung: Secondary | ICD-10-CM | POA: Diagnosis not present

## 2022-01-18 DIAGNOSIS — C7981 Secondary malignant neoplasm of breast: Secondary | ICD-10-CM | POA: Diagnosis not present

## 2022-01-18 DIAGNOSIS — Z7401 Bed confinement status: Secondary | ICD-10-CM | POA: Diagnosis not present

## 2022-01-18 LAB — COMPREHENSIVE METABOLIC PANEL
ALT: 13 U/L (ref 0–44)
AST: 17 U/L (ref 15–41)
Albumin: 2.1 g/dL — ABNORMAL LOW (ref 3.5–5.0)
Alkaline Phosphatase: 137 U/L — ABNORMAL HIGH (ref 38–126)
Anion gap: 12 (ref 5–15)
BUN: 40 mg/dL — ABNORMAL HIGH (ref 8–23)
CO2: 22 mmol/L (ref 22–32)
Calcium: 8.8 mg/dL — ABNORMAL LOW (ref 8.9–10.3)
Chloride: 101 mmol/L (ref 98–111)
Creatinine, Ser: 1.09 mg/dL — ABNORMAL HIGH (ref 0.44–1.00)
GFR, Estimated: 55 mL/min — ABNORMAL LOW (ref >60)
Glucose, Bld: 128 mg/dL — ABNORMAL HIGH (ref 70–99)
Potassium: 5.2 mmol/L — ABNORMAL HIGH (ref 3.5–5.1)
Sodium: 135 mmol/L (ref 135–145)
Total Bilirubin: 1.2 mg/dL (ref 0.3–1.2)
Total Protein: 6.1 g/dL — ABNORMAL LOW (ref 6.5–8.1)

## 2022-01-18 LAB — CBC WITH DIFFERENTIAL/PLATELET
Band Neutrophils: 22 %
Basophils Absolute: 0 10*3/uL (ref 0.0–0.1)
Basophils Relative: 0 %
Eosinophils Absolute: 0 10*3/uL (ref 0.0–0.5)
Eosinophils Relative: 0 %
HCT: 34.4 % — ABNORMAL LOW (ref 36.0–46.0)
Hemoglobin: 11.1 g/dL — ABNORMAL LOW (ref 12.0–15.0)
Lymphocytes Relative: 2 %
Lymphs Abs: 0.5 10*3/uL — ABNORMAL LOW (ref 0.7–4.0)
MCH: 30.2 pg (ref 26.0–34.0)
MCHC: 32.3 g/dL (ref 30.0–36.0)
MCV: 93.7 fL (ref 80.0–100.0)
Monocytes Absolute: 0 10*3/uL — ABNORMAL LOW (ref 0.1–1.0)
Monocytes Relative: 0 %
Neutro Abs: 25.7 10*3/uL — ABNORMAL HIGH (ref 1.7–7.7)
Neutrophils Relative %: 75 %
Platelets: 496 10*3/uL — ABNORMAL HIGH (ref 150–400)
Promyelocytes Relative: 1 %
RBC: 3.67 MIL/uL — ABNORMAL LOW (ref 3.87–5.11)
RDW: 14.2 % (ref 11.5–15.5)
WBC: 26.5 10*3/uL — ABNORMAL HIGH (ref 4.0–10.5)
nRBC: 0 % (ref 0.0–0.2)

## 2022-01-18 LAB — TSH: TSH: 2.509 u[IU]/mL (ref 0.350–4.500)

## 2022-01-18 LAB — BASIC METABOLIC PANEL
Anion gap: 11 (ref 5–15)
BUN: 40 mg/dL — ABNORMAL HIGH (ref 8–23)
CO2: 21 mmol/L — ABNORMAL LOW (ref 22–32)
Calcium: 8.4 mg/dL — ABNORMAL LOW (ref 8.9–10.3)
Chloride: 103 mmol/L (ref 98–111)
Creatinine, Ser: 1.08 mg/dL — ABNORMAL HIGH (ref 0.44–1.00)
GFR, Estimated: 55 mL/min — ABNORMAL LOW (ref >60)
Glucose, Bld: 99 mg/dL (ref 70–99)
Potassium: 5.2 mmol/L — ABNORMAL HIGH (ref 3.5–5.1)
Sodium: 135 mmol/L (ref 135–145)

## 2022-01-18 LAB — CBC
HCT: 33 % — ABNORMAL LOW (ref 36.0–46.0)
Hemoglobin: 10.3 g/dL — ABNORMAL LOW (ref 12.0–15.0)
MCH: 29.5 pg (ref 26.0–34.0)
MCHC: 31.2 g/dL (ref 30.0–36.0)
MCV: 94.6 fL (ref 80.0–100.0)
Platelets: 442 10*3/uL — ABNORMAL HIGH (ref 150–400)
RBC: 3.49 MIL/uL — ABNORMAL LOW (ref 3.87–5.11)
RDW: 14.3 % (ref 11.5–15.5)
WBC: 22.6 10*3/uL — ABNORMAL HIGH (ref 4.0–10.5)
nRBC: 0 % (ref 0.0–0.2)

## 2022-01-18 LAB — LACTIC ACID, PLASMA: Lactic Acid, Venous: 1.8 mmol/L (ref 0.5–1.9)

## 2022-01-18 LAB — PROTIME-INR
INR: 1.2 (ref 0.8–1.2)
Prothrombin Time: 15.3 seconds — ABNORMAL HIGH (ref 11.4–15.2)

## 2022-01-18 LAB — MAGNESIUM
Magnesium: 1.7 mg/dL (ref 1.7–2.4)
Magnesium: 1.8 mg/dL (ref 1.7–2.4)

## 2022-01-18 LAB — APTT: aPTT: 31 seconds (ref 24–36)

## 2022-01-18 IMAGING — DX DG CHEST 1V PORT
1 series · 1 of 1 positions shown · non-contrast
Comparison: [DATE]

CLINICAL DATA: Atrial fibrillation

EXAM:
PORTABLE CHEST 1 VIEW

[chest ap]
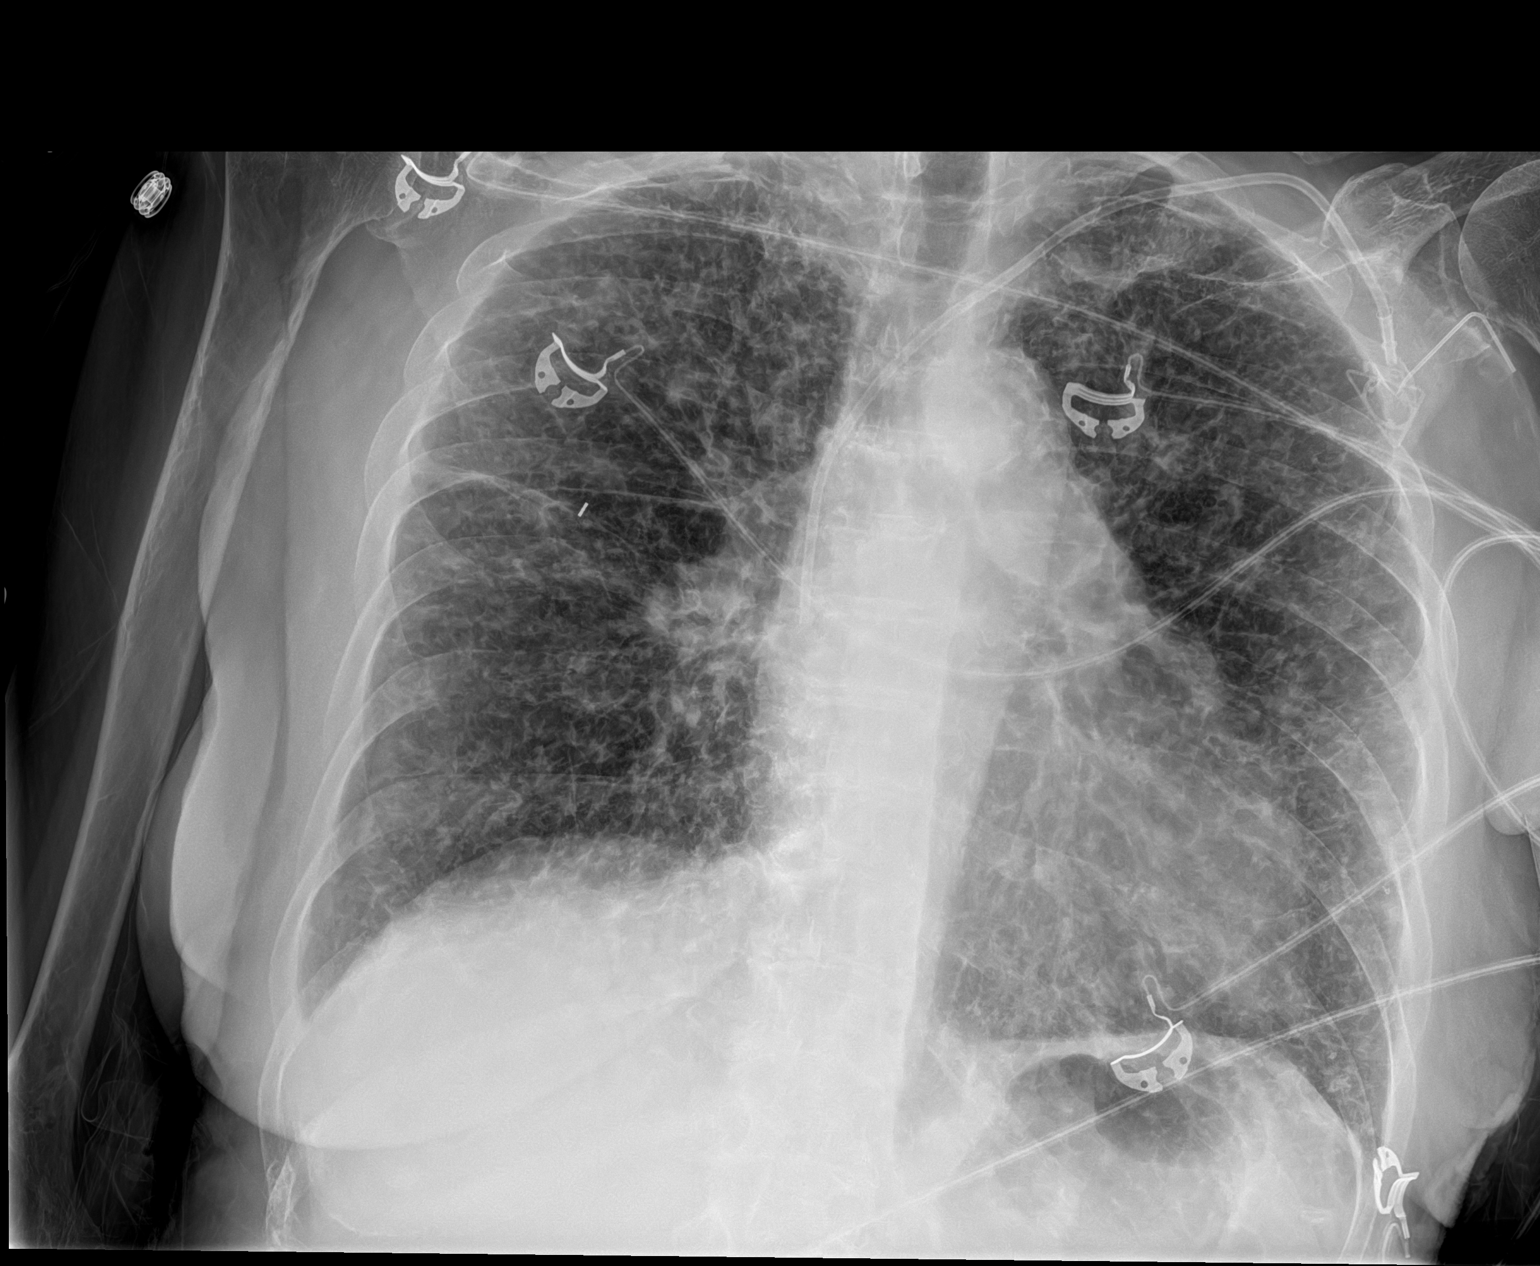

[1 of 1 positions shown; findings below may reference images not displayed]

FINDINGS: Diffuse reticulonodular interstitial disease likely reflecting a
combination of metastatic disease and underlying emphysematous
changes. No new focal consolidation. No pleural effusion or
pneumothorax. Heart and mediastinal contours are unremarkable.

No acute osseous abnormality.

Right-sided Port-A-Cath in satisfactory position.
IMPRESSION: 1. Diffuse reticulonodular interstitial disease likely reflecting a
combination of metastatic disease and underlying emphysematous
changes.
2. No new focal parenchymal opacity suggests pneumonia.

## 2022-01-18 MED ORDER — DEXTROSE-NACL 5-0.45 % IV SOLN: INTRAVENOUS | Status: DC

## 2022-01-18 MED ORDER — AMIODARONE IV BOLUS ONLY 150 MG/100ML
150.0000 mg | Freq: Once | INTRAVENOUS | Status: AC
  Administered 2022-01-18: 150 mg via INTRAVENOUS
  Filled 2022-01-18: qty 100

## 2022-01-18 MED ORDER — CYANOCOBALAMIN 1000 MCG/ML IJ SOLN
1000.0000 ug | Freq: Once | INTRAMUSCULAR | Status: AC
  Administered 2022-01-18: 1000 ug via INTRAMUSCULAR
  Filled 2022-01-18: qty 1

## 2022-01-18 MED ORDER — LACTATED RINGERS IV BOLUS: 1000.0000 mL | Freq: Once | INTRAVENOUS | Status: DC

## 2022-01-18 MED ORDER — ACETAMINOPHEN 325 MG PO TABS: 650.0000 mg | ORAL_TABLET | ORAL | Status: DC | PRN

## 2022-01-18 MED ORDER — BISACODYL 10 MG RE SUPP: 10.0000 mg | RECTAL | Status: DC | PRN

## 2022-01-18 MED ORDER — AMIODARONE LOAD VIA INFUSION: 150.0000 mg | Freq: Once | INTRAVENOUS | Status: DC

## 2022-01-18 MED ORDER — SODIUM CHLORIDE 0.9 % IV SOLN: Freq: Once | INTRAVENOUS | Status: AC

## 2022-01-18 MED ORDER — SODIUM CHLORIDE 0.9 % IV SOLN: INTRAVENOUS | Status: DC

## 2022-01-18 MED ORDER — IBUPROFEN 400 MG PO TABS: 600.0000 mg | ORAL_TABLET | Freq: Four times a day (QID) | ORAL | Status: DC | PRN

## 2022-01-18 MED ORDER — POTASSIUM CHLORIDE CRYS ER 20 MEQ PO TBCR: 20.0000 meq | EXTENDED_RELEASE_TABLET | Freq: Two times a day (BID) | ORAL | Status: DC

## 2022-01-18 MED ORDER — AMIODARONE HCL IN DEXTROSE 360-4.14 MG/200ML-% IV SOLN
60.0000 mg/h | INTRAVENOUS | Status: AC
  Administered 2022-01-18: 60 mg/h via INTRAVENOUS
  Filled 2022-01-18 (×2): qty 200

## 2022-01-18 MED ORDER — ANASTROZOLE 1 MG PO TABS
1.0000 mg | ORAL_TABLET | Freq: Every day | ORAL | Status: DC
  Administered 2022-01-19 – 2022-01-20 (×2): 1 mg via ORAL
  Filled 2022-01-18 (×3): qty 1

## 2022-01-18 MED ORDER — SIMVASTATIN 20 MG PO TABS
20.0000 mg | ORAL_TABLET | Freq: Every day | ORAL | Status: DC
  Administered 2022-01-18: 20 mg via ORAL
  Filled 2022-01-18: qty 2

## 2022-01-18 MED ORDER — MEGESTROL ACETATE 400 MG/10ML PO SUSP
400.0000 mg | Freq: Two times a day (BID) | ORAL | Status: DC
  Administered 2022-01-18 – 2022-01-20 (×3): 400 mg via ORAL
  Filled 2022-01-18 (×3): qty 10

## 2022-01-18 MED ORDER — AMIODARONE HCL 200 MG PO TABS
200.0000 mg | ORAL_TABLET | Freq: Every day | ORAL | Status: DC
  Administered 2022-01-19: 200 mg via ORAL
  Filled 2022-01-18: qty 1

## 2022-01-18 MED ORDER — CHLORHEXIDINE GLUCONATE CLOTH 2 % EX PADS
6.0000 | MEDICATED_PAD | Freq: Every day | CUTANEOUS | Status: DC
  Administered 2022-01-19 – 2022-01-20 (×2): 6 via TOPICAL

## 2022-01-18 MED ORDER — DOCUSATE SODIUM 100 MG PO CAPS
100.0000 mg | ORAL_CAPSULE | Freq: Two times a day (BID) | ORAL | Status: DC
  Administered 2022-01-18 – 2022-01-20 (×4): 100 mg via ORAL
  Filled 2022-01-18 (×4): qty 1

## 2022-01-18 MED ORDER — OXYCODONE HCL 5 MG PO TABS: 10.0000 mg | ORAL_TABLET | Freq: Three times a day (TID) | ORAL | Status: DC | PRN

## 2022-01-18 MED ORDER — AMIODARONE HCL IN DEXTROSE 360-4.14 MG/200ML-% IV SOLN
30.0000 mg/h | INTRAVENOUS | Status: AC
  Administered 2022-01-18: 30 mg/h via INTRAVENOUS

## 2022-01-18 MED ORDER — ENSURE ENLIVE PO LIQD
237.0000 mL | Freq: Two times a day (BID) | ORAL | Status: DC
  Administered 2022-01-19 – 2022-01-20 (×3): 237 mL via ORAL

## 2022-01-18 MED ORDER — ONDANSETRON HCL 4 MG/2ML IJ SOLN: 4.0000 mg | Freq: Four times a day (QID) | INTRAMUSCULAR | Status: DC | PRN

## 2022-01-18 MED ORDER — DILTIAZEM HCL 30 MG PO TABS
30.0000 mg | ORAL_TABLET | Freq: Four times a day (QID) | ORAL | Status: DC
  Administered 2022-01-18 – 2022-01-19 (×3): 30 mg via ORAL
  Filled 2022-01-18 (×3): qty 1

## 2022-01-18 MED ORDER — PROCHLORPERAZINE MALEATE 10 MG PO TABS: 10.0000 mg | ORAL_TABLET | Freq: Four times a day (QID) | ORAL | Status: DC | PRN

## 2022-01-18 MED ORDER — OXYCODONE HCL 5 MG PO TABS
10.0000 mg | ORAL_TABLET | Freq: Four times a day (QID) | ORAL | Status: DC
  Administered 2022-01-18 – 2022-01-20 (×8): 10 mg via ORAL
  Filled 2022-01-18 (×8): qty 2

## 2022-01-18 NOTE — Subjective & Objective (Signed)
Elizabeth Wu is a 71 y.o. 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 Recent history of A Fib RVR with ED visit 12/06/21 and admit 12/31/21. She was evaluated by cardiology during her admission. She has been started on po Amiodarone for rate control. She is not a candidate for anticoagulation due to brain mets. Other problems include HTN, HLD, tobacco abuse. By report she has had poor PO intake and has had progressive weakness/inanitation who presented to the cancer center for fluid infusioin. She was noted to have mild hypotension and tachycardia. EKG revealed A. Fib with RVR. She was directly referred to ED for tx and further evaluation.  She was started on IV amiodarone.  Patient referred for observation due to A Fib with RVR and hyperkalemia.

## 2022-01-18 NOTE — ED Notes (Signed)
Patient's port was accessed when she arrived from cancer center.

## 2022-01-18 NOTE — Assessment & Plan Note (Addendum)
Discontinuing statins

## 2022-01-18 NOTE — ED Notes (Signed)
Advised RN of place on pt bottom around 18.15

## 2022-01-18 NOTE — Assessment & Plan Note (Addendum)
Continue home regimen -Patient and family discussed with Dr. Delton Coombes patient is unable to pursue any further treatment -They expressed their wishes to pursue hospice and comfort care

## 2022-01-18 NOTE — Patient Instructions (Signed)
[place]  Discharge Instructions: Thank you for choosing [facility] to provide your oncology and hematology care.  If you have a lab appointment with the [facility], please come in thru the Main Entrance and check in at the main information desk.  Wear comfortable clothing and clothing appropriate for easy access to any Portacath or PICC line.   We strive to give you quality time with your provider. You may need to reschedule your appointment if you arrive late (15 or more minutes).  Arriving late affects you and other patients whose appointments are after yours.  Also, if you miss three or more appointments without notifying the office, you may be dismissed from the clinic at the providers discretion.      For prescription refill requests, have your pharmacy contact our office and allow 72 hours for refills to be completed.    Today you received the following : 500 mls of Normal Saline.       To help prevent nausea and vomiting after your treatment, we encourage you to take your nausea medication as directed.  BELOW ARE SYMPTOMS THAT SHOULD BE REPORTED IMMEDIATELY: *FEVER GREATER THAN 100.4 F (38 C) OR HIGHER *CHILLS OR SWEATING *NAUSEA AND VOMITING THAT IS NOT CONTROLLED WITH YOUR NAUSEA MEDICATION *UNUSUAL SHORTNESS OF BREATH *UNUSUAL BRUISING OR BLEEDING *URINARY PROBLEMS (pain or burning when urinating, or frequent urination) *BOWEL PROBLEMS (unusual diarrhea, constipation, pain near the anus) TENDERNESS IN MOUTH AND THROAT WITH OR WITHOUT PRESENCE OF ULCERS (sore throat, sores in mouth, or a toothache) UNUSUAL RASH, SWELLING OR PAIN  UNUSUAL VAGINAL DISCHARGE OR ITCHING   Items with * indicate a potential emergency and should be followed up as soon as possible or go to the Emergency Department if any problems should occur.  Please show the CHEMOTHERAPY ALERT CARD or IMMUNOTHERAPY ALERT CARD at check-in to the Emergency Department and triage  nurse.  Should you have questions after your visit or need to cancel or reschedule your appointment, please contact [facility] [phone]  and follow the prompts.  Office hours are 8:00 a.m. to 4:30 p.m. Monday - Friday. Please note that voicemails left after 4:00 p.m. may not be returned until the following business day.  We are closed weekends and major holidays. You have access to a nurse at all times for urgent questions. Please call the main number to the clinic [phone] and follow the prompts.  For any non-urgent questions, you may also contact your provider using MyChart. We now offer e-Visits for anyone 4 and older to request care online for non-urgent symptoms. For details visit [email or web address].   Also download the MyChart app! Go to the app store, search "MyChart", open the app, select [facility], and log in with your MyChart username and password.  Due to Covid, a mask is required upon entering the hospital/clinic. If you do not have a mask, one will be given to you upon arrival. For doctor visits, patients may have 1 support person aged 63 or older with them. For treatment visits, patients cannot have anyone with them due to current Covid guidelines and our immunocompromised population.

## 2022-01-18 NOTE — ED Triage Notes (Signed)
Pt came from [facility] with c/o AFIB with RVR, low po intake, weakness. [place] gave 565ml NS , assessed her port and requested to come to ED. Lab stated 1.369 creatine, Potassium 5.2.

## 2022-01-18 NOTE — Assessment & Plan Note (Addendum)
Historically with HTN -currently hypotensive Continue Cardizem and amiodarone

## 2022-01-18 NOTE — ED Notes (Addendum)
Large wound to left buttocks noted, black in color with foul smell. Foam dressing applied. MD made aware.

## 2022-01-18 NOTE — Progress Notes (Signed)
Patient presents today to the clinic due to fatigue. Patient scheduled 01/19/2022 for labs and appointment with R. Pennington PA. Patient accompanied by family. Heart rate 145 on arrival. Patient has complaints of fatigue only. Labs drawn today while patient here.   Reported elevated heart rate to R. Pennington PA. Orders received to infuse an additional 500 mls of NS. Heart rate recheck 152. Patient denies chest pain, shortness of breath or dizziness.   11:06 am R. Pennington PA at the bedside to speak with patient. Heart rate 156 and blood pressure 88/61. Verbal order received to send patient to the ED at this time.   11:08 am Called report to charge nurse Lyn Henri RN. L. Long RN instructed RN to leave patient accessed and normal saline running by gravity. Patient to go to room 16 in the ED .  Patient taken down to the ED in her personal wheel chair and accompanied by G. Foster CNA. Patient stable with no complaints at this time. Heart rate 142 at discharge. Patient denies any chest pain, shortness of breath, or dizziness. 500 mls of normal saline infused today per PA orders. Tolerated infusion without adverse affects. No complaints at this time. Discharged from clinic in stable condition. Alert and oriented x 3. F/U with [facility] as schedule. Plan of care discussed with family and patient by R. Pennington PA. Understanding verbalized.

## 2022-01-18 NOTE — Assessment & Plan Note (Addendum)
Patient with h/o PAF s/p ED visit 12/06/21 and admit 12/31/21.  -Currently in A-fib with RVR, and hypotensive Exacerbated by dehydration - In ED received IV bolus amiodarone and infusion with conversion to Sinus Tach.  -IV amiodarone has been switched to to 200 mg PO bid - Start low dose diltiazem 30 mg q 6, hold for SBP < 90 -Cardiology consulted .Marland Kitchen  Was consulted

## 2022-01-18 NOTE — ED Provider Notes (Signed)
[facility] Provider Note   CSN: [id number] Arrival date & time: 01/18/22  1115     History  Chief Complaint  Patient presents with   Irregular Heart Beat    Elizabeth Wu is a 71 y.o. female.  HPI     71 year old female comes in with chief complaint of irregular heart rate, fast heart rate and dehydration. Patient has history of A-fib, metastatic lung cancer and bilateral breast cancer.  Patient is not on anticoagulation but is on amiodarone.  She is accompanied by her sister with chief complaint of weakness, dehydration, fast heart rate.  According to the sister, patient has gotten weaker over the past few days.  She has not been eating and drinking well.  She took her to the cancer center for infusion, patient was noted to be in A-fib with RVR and sent to the ER.  Patient alleges that she is taking all medications as prescribed.  Review of system is negative for any chest pain, cough, UTI-like symptoms, abdominal pain, nausea, vomiting, diarrhea, headaches, neck pain, rash.  Home Medications Prior to Admission medications   Medication Sig Start Date End Date Taking? Authorizing Provider  amiodarone (PACERONE) 200 MG tablet Take 1 tablet (200 mg total) by mouth 2 (two) times daily. 01/01/22  Yes Dessa Phi, DO  anastrozole (ARIMIDEX) 1 MG tablet Take 1 tablet (1 mg total) by mouth daily. 11/18/21  Yes Derek Jack, MD  bisacodyl (DULCOLAX) 10 MG suppository Place 1 suppository (10 mg total) rectally as needed for moderate constipation. 12/28/21  Yes Pennington, Rebekah M, PA-C  Cholecalciferol (VITAMIN D) 50 MCG (2000 UT) tablet Take 2,000 Units by mouth daily.   Yes [provider]  cyanocobalamin (,VITAMIN B-12,) 1000 MCG/ML injection Inject 1,000 mcg into the muscle every 30 (thirty) days.   Yes [provider]  docusate sodium (COLACE) 100 MG capsule Take 100 mg by mouth 2 (two) times daily.   Yes [provider]   Ensure (ENSURE) Take 237 mLs by mouth 2 (two) times daily between meals.   Yes [provider]  folic acid (FOLVITE) 1 MG tablet Take 1 mg by mouth daily.   Yes [provider]  IBU 600 MG tablet Take 600 mg by mouth every 6 (six) hours as needed for headache, fever or mild pain. 01/07/22  Yes [provider]  megestrol (MEGACE) 400 MG/10ML suspension Take 10 mLs (400 mg total) by mouth 2 (two) times daily. 11/18/21  Yes Derek Jack, MD  Omega-3 Fatty Acids (FISH OIL) 1000 MG CAPS Take 1,000 mg by mouth daily.   Yes [provider]  Oxycodone HCl 10 MG TABS Take 10 mg by mouth 3 (three) times daily as needed. 01/11/22  Yes [provider]  oxyCODONE-acetaminophen (PERCOCET) 10-325 MG tablet TAKE 1 TABLET EVERY 4 HOURS AS NEEDED FOR PAIN 01/11/22  Yes Pennington, Rebekah M, PA-C  potassium chloride SA (KLOR-CON M) 20 MEQ tablet Take 1 tablet (20 mEq total) by mouth 2 (two) times daily. 01/11/22  Yes Derek Jack, MD  prochlorperazine (COMPAZINE) 10 MG tablet Take 1 tablet (10 mg total) by mouth every 6 (six) hours as needed for nausea or vomiting. 10/25/21  Yes Derek Jack, MD  simvastatin (ZOCOR) 20 MG tablet TAKE 1 TABLET AT BEDTIME 01/07/22  Yes Gottschalk, Leatrice Jewels M, DO  Misc. Devices MISC Please provide with wheelchair; dx metastatic lung cancer 12/13/21   Derek Jack, MD      Allergies  Patient has no known allergies.    Review of Systems   Review of Systems  All other systems reviewed and are negative.  Physical Exam Updated Vital Signs BP 103/60    Pulse (!) 110    Temp (!) 97.4 F (36.3 C) (Oral)    Resp 15    Ht 5\' 4"  (1.626 m)    Wt 51.7 kg    SpO2 94%    BMI 19.57 kg/m  Physical Exam Vitals and nursing note reviewed.  Constitutional:      Appearance: She is well-developed.  HENT:     Head: Atraumatic.  Cardiovascular:     Rate and Rhythm: Tachycardia present. Rhythm irregular.  Pulmonary:      Effort: Pulmonary effort is normal.  Musculoskeletal:     Cervical back: Normal range of motion and neck supple.  Skin:    General: Skin is warm and dry.  Neurological:     Mental Status: She is alert and oriented to person, place, and time.    ED Results / Procedures / Treatments   Labs (all labs ordered are listed, but only abnormal results are displayed) Labs Reviewed  BASIC METABOLIC PANEL - Abnormal; Notable for the following components:      Result Value   Potassium 5.2 (*)    CO2 21 (*)    BUN 40 (*)    Creatinine, Ser 1.08 (*)    Calcium 8.4 (*)    GFR, Estimated 55 (*)    All other components within normal limits  CBC - Abnormal; Notable for the following components:   WBC 22.6 (*)    RBC 3.49 (*)    Hemoglobin 10.3 (*)    HCT 33.0 (*)    Platelets 442 (*)    All other components within normal limits  CULTURE, BLOOD (ROUTINE X 2)  CULTURE, BLOOD (ROUTINE X 2)  URINE CULTURE  MAGNESIUM  LACTIC ACID, PLASMA  LACTIC ACID, PLASMA  PROTIME-INR  APTT  URINALYSIS, ROUTINE W REFLEX MICROSCOPIC    EKG EKG Interpretation  Date/Time:  Tuesday January 18 2022 11:29:42 EST Ventricular Rate:  119 PR Interval:  120 QRS Duration: 94 QT Interval:  304 QTC Calculation: 428 R Axis:   52 Text Interpretation: Sinus tachycardia No acute changes previous ekg had AF with RVR Confirmed by Varney Biles ([id number]) on 01/18/2022 3:43:55 PM  Radiology DG Chest Port 1 View  Result Date: 01/18/2022 CLINICAL DATA:  Atrial fibrillation EXAM: PORTABLE CHEST 1 VIEW COMPARISON:  12/31/2021 FINDINGS: Diffuse reticulonodular interstitial disease likely reflecting a combination of metastatic disease and underlying emphysematous changes. No new focal consolidation. No pleural effusion or pneumothorax. Heart and mediastinal contours are unremarkable. No acute osseous abnormality. Right-sided Port-A-Cath in satisfactory position. IMPRESSION: 1. Diffuse reticulonodular interstitial disease  likely reflecting a combination of metastatic disease and underlying emphysematous changes. 2. No new focal parenchymal opacity suggests pneumonia. Electronically Signed   By: Kathreen Devoid M.D.   On: 01/18/2022 11:54    Procedures .Critical Care Performed by: Varney Biles, MD Authorized by: Varney Biles, MD   Critical care provider statement:    Critical care time (minutes):  38   Critical care was necessary to treat or prevent imminent or life-threatening deterioration of the following conditions:  Circulatory failure   Critical care was time spent personally by me on the following activities:  Development of treatment plan with patient or surrogate, discussions with consultants, evaluation of patient's response to treatment, examination of patient, ordering and review of  laboratory studies, ordering and review of radiographic studies, ordering and performing treatments and interventions, pulse oximetry, re-evaluation of patient's condition, review of old charts and obtaining history from patient or surrogate    Medications Ordered in ED Medications  amiodarone (NEXTERONE PREMIX) 360-4.14 MG/200ML-% (1.8 mg/mL) IV infusion (60 mg/hr Intravenous New Bag/Given 01/18/22 1516)  amiodarone (NEXTERONE PREMIX) 360-4.14 MG/200ML-% (1.8 mg/mL) IV infusion (has no administration in time range)  ibuprofen (ADVIL) tablet 600 mg (has no administration in time range)  oxyCODONE (Oxy IR/ROXICODONE) immediate release tablet 10 mg (has no administration in time range)  anastrozole (ARIMIDEX) tablet 1 mg (has no administration in time range)  megestrol (MEGACE) 400 MG/10ML suspension 400 mg (has no administration in time range)  simvastatin (ZOCOR) tablet 20 mg (has no administration in time range)  prochlorperazine (COMPAZINE) tablet 10 mg (has no administration in time range)  bisacodyl (DULCOLAX) suppository 10 mg (has no administration in time range)  docusate sodium (COLACE) capsule 100 mg (has no  administration in time range)  feeding supplement (ENSURE ENLIVE / ENSURE PLUS) liquid 237 mL (has no administration in time range)  0.9 %  sodium chloride infusion (0 mLs Intravenous Stopped 01/18/22 1401)  amiodarone (NEXTERONE) IV bolus only 150 mg/100 mL (0 mg Intravenous Stopped 01/18/22 1303)    ED Course/ Medical Decision Making/ A&P Clinical Course as of 01/18/22 1547  Tue Jan 18, 2022  1546 WBC(!): 22.6 Patient has elevated white count. Repeat history completed, and there is still no red flags for underlying infection.  Family indicates that patient has not had any infections recently,  Plan is to get blood cultures, admit her to the hospital, and she can be discharged if the cultures are negative without any antibiotics.  If patient's symptoms start getting worse, she develops new fevers then they can start antibiotics sooner.  Clinically not septic at this time. [AN]  1547 BUN(!): 40 BUN to creatinine ratio over 40, indicates more of a dehydration like picture.  Medicine will admit. [AN]    Clinical Course User Index [AN] Varney Biles, MD                           Medical Decision Making Amount and/or Complexity of Data Reviewed Labs: ordered. Radiology: ordered.  Risk Prescription drug management. Decision regarding hospitalization.   71 year old female with history of metastatic breast and lung cancer, A-fib on amiodarone comes in with chief complaint of dehydration  I've reviewed patient's previous work-up, she has had couple of admissions to the hospital for A-fib with RVR within the last couple of months.  All due to similar context based on history provided by patient's sister, who is at the bedside.  Although her EKG showing sinus tachycardia, while in the room, I noted that patient is in A-fib with RVR with heart rate in the 140s to 160s.  She does appear dry.  History is not suggestive of any underlying infection.  Initial suspicion is that patient likely  has A-fib with RVR secondary to dehydration and possibly electrolyte abnormalities.  She is at risk for AKI, therefore basic blood work-up including CBC and metabolic profile has been ordered.  Patient had a CT PE done within the last 2 months which was negative for PE.  Low suspicion for PE, and in the setting of negative CAT scan within the last 3 months, I do not think a repeat CT is indicated at this time.     Final  Clinical Impression(s) / ED Diagnoses Final diagnoses:  Atrial fibrillation with RVR ([place])  Dehydration    Rx / DC Orders ED Discharge Orders     None         Varney Biles, MD 01/18/22 1547

## 2022-01-18 NOTE — ED Notes (Signed)
Port assessed at [facility] center.

## 2022-01-18 NOTE — ED Notes (Signed)
Unable to sign MSE 

## 2022-01-18 NOTE — ED Notes (Signed)
Hospitalist at bedside, MOST form completed at this time.

## 2022-01-18 NOTE — H&P (Addendum)
History and Physical    Elizabeth Wu GEX:[id number] DOB: 1951-05-01 DOA: 01/18/2022  DOS: the patient was seen and examined on 01/18/2022  PCP: [name], DO   Patient coming from: Home  I have personally briefly reviewed patient's old medical records in [place] is a 71 y.o. 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 Recent history of A Fib RVR with ED visit 12/06/21 and admit 12/31/21. She was evaluated by cardiology during her admission. She has been started on po Amiodarone for rate control. She is not a candidate for anticoagulation due to brain mets. Other problems include HTN, HLD, tobacco abuse. By report she has had poor PO intake and has had progressive weakness/inanitation who presented to the cancer center for fluid infusioin. She was noted to have mild hypotension and tachycardia. EKG revealed A. Fib with RVR. She was directly referred to ED for tx and further evaluation.  She was started on IV amiodarone.  Patient referred for observation due to A Fib with RVR and hyperkalemia.   ED Course: T 97.4  101/79  HR 142  RR 15. Abnormal Lab: K 5.2, BUN 40, Cr 1.08, Ca 8.4, WBC 22.6 no diff done, Hgb 10.3, Plts  442.EKG @ 11:21 - A. Fib with RVR, @ 12.28 Sinus tach. In ED patient received 1L bolus NS, bolus IV amiodarone and continuous infusion. TRH called to admit for continued treatment.   Review of Systems:  Review of Systems  Constitutional:  Positive for malaise/fatigue and weight loss. Negative for fever.  HENT:         Xerostomia-severe  Gastrointestinal: Negative.   Genitourinary: Negative.   Musculoskeletal:  Positive for back pain and myalgias.  Skin: Negative.   Neurological:  Positive for weakness.  Endo/Heme/Allergies: Negative.   Psychiatric/Behavioral: Negative.     Past Medical History:  Diagnosis Date   Allergy    Cancer ([place])    Breast   Complication  of anesthesia    DJD (degenerative joint disease)    Hyperlipidemia    Hypertension    Osteopenia    PONV (postoperative nausea and vomiting)     Past Surgical History:  Procedure Laterality Date   APPENDECTOMY     BRONCHIAL BIOPSY  11/11/2021   Procedure: BRONCHIAL BIOPSIES;  Surgeon: Collene Gobble, MD;  Location: [facility];  Service: Pulmonary;;   BRONCHIAL BRUSHINGS  11/11/2021   Procedure: BRONCHIAL BRUSHINGS;  Surgeon: Collene Gobble, MD;  Location: [facility] ENDOSCOPY;  Service: Pulmonary;;   BRONCHIAL NEEDLE ASPIRATION BIOPSY  11/11/2021   Procedure: BRONCHIAL NEEDLE ASPIRATION BIOPSIES;  Surgeon: Collene Gobble, MD;  Location: [facility] ENDOSCOPY;  Service: Pulmonary;;   FIDUCIAL MARKER PLACEMENT  11/11/2021   Procedure: FIDUCIAL MARKER PLACEMENT;  Surgeon: Collene Gobble, MD;  Location: [facility] ENDOSCOPY;  Service: Pulmonary;;   HERNIA REPAIR     PORTACATH PLACEMENT Left 12/10/2021   Procedure: INSERTION PORT-A-CATH;  Surgeon: Virl Cagey, MD;  Location: [facility];  Service: General;  Laterality: Left;   TONSILLECTOMY     TONSILLECTOMY      Social Hx - maiden. Lives with a friend. Her sister is involved with her care and will be HCPOA. Worked in Architect, as a Artist, in Charity fundraiser. Had been I-ADLs   reports that she has been smoking cigarettes. She has a 16.50 pack-year smoking history. She has never used smokeless tobacco. She reports that she  does not drink alcohol and does not use drugs.  No Known Allergies  Family History  Problem Relation Age of Onset   Alzheimer's disease Mother    Cancer Father        stomach cancer   Diabetes Sister    Heart disease Brother        MI    Prior to Admission medications   Medication Sig Start Date End Date Taking? Authorizing Provider  amiodarone (PACERONE) 200 MG tablet Take 1 tablet (200 mg total) by mouth 2 (two) times daily. 01/01/22  Yes Dessa Phi, DO  anastrozole (ARIMIDEX) 1 MG tablet Take 1 tablet (1 mg total) by  mouth daily. 11/18/21  Yes Derek Jack, MD  bisacodyl (DULCOLAX) 10 MG suppository Place 1 suppository (10 mg total) rectally as needed for moderate constipation. 12/28/21  Yes Pennington, Rebekah M, PA-C  Cholecalciferol (VITAMIN D) 50 MCG (2000 UT) tablet Take 2,000 Units by mouth daily.   Yes [provider]  cyanocobalamin (,VITAMIN B-12,) 1000 MCG/ML injection Inject 1,000 mcg into the muscle every 30 (thirty) days.   Yes [provider]  docusate sodium (COLACE) 100 MG capsule Take 100 mg by mouth 2 (two) times daily.   Yes [provider]  Ensure (ENSURE) Take 237 mLs by mouth 2 (two) times daily between meals.   Yes [provider]  folic acid (FOLVITE) 1 MG tablet Take 1 mg by mouth daily.   Yes [provider]  IBU 600 MG tablet Take 600 mg by mouth every 6 (six) hours as needed for headache, fever or mild pain. 01/07/22  Yes [provider]  megestrol (MEGACE) 400 MG/10ML suspension Take 10 mLs (400 mg total) by mouth 2 (two) times daily. 11/18/21  Yes Derek Jack, MD  Omega-3 Fatty Acids (FISH OIL) 1000 MG CAPS Take 1,000 mg by mouth daily.   Yes [provider]  Oxycodone HCl 10 MG TABS Take 10 mg by mouth 3 (three) times daily as needed. 01/11/22  Yes [provider]  oxyCODONE-acetaminophen (PERCOCET) 10-325 MG tablet TAKE 1 TABLET EVERY 4 HOURS AS NEEDED FOR PAIN 01/11/22  Yes Pennington, Rebekah M, PA-C  potassium chloride SA (KLOR-CON M) 20 MEQ tablet Take 1 tablet (20 mEq total) by mouth 2 (two) times daily. 01/11/22  Yes Derek Jack, MD  prochlorperazine (COMPAZINE) 10 MG tablet Take 1 tablet (10 mg total) by mouth every 6 (six) hours as needed for nausea or vomiting. 10/25/21  Yes Derek Jack, MD  simvastatin (ZOCOR) 20 MG tablet TAKE 1 TABLET AT BEDTIME 01/07/22  Yes Gottschalk, Leatrice Jewels M, DO  Misc. Devices MISC Please provide with wheelchair; dx metastatic lung cancer 12/13/21    Derek Jack, MD    Physical Exam: Vitals:   01/18/22 1800 01/18/22 1815 01/18/22 1830 01/18/22 1845  BP: 139/87  111/62   Pulse: (!) 144 (!) 112 (!) 107 (!) 111  Resp: (!) 23 (!) 22 18 (!) 23  Temp:      TempSrc:      SpO2: 95% 93% 95% 95%  Weight:      Height:        Physical Exam Constitutional:      Comments: Chronically ill appearing. In no distress  HENT:     Head: Normocephalic and atraumatic.     Nose: Nose normal.     Mouth/Throat:     Mouth: Mucous membranes are dry.  Eyes:     Extraocular Movements: Extraocular movements intact.  Pupils: Pupils are equal, round, and reactive to light.  Cardiovascular:     Rate and Rhythm: Tachycardia present.     Heart sounds: Normal heart sounds. No murmur heard.    Comments: Regular with frequent PVCs Pulmonary:     Effort: Pulmonary effort is normal.     Breath sounds: Normal breath sounds.  Abdominal:     Palpations: Abdomen is soft.     Tenderness: There is abdominal tenderness. There is no guarding or rebound.     Comments: Mildly diffuse tenderness, worst in RUQ  Musculoskeletal:        General: Normal range of motion.     Cervical back: Normal range of motion and neck supple.  Skin:    General: Skin is warm and dry.     Findings: Lesion present.     Comments: Left sacral area with 3x7 cm black skin change. On exam and palpation appears to be superficial without exudate, strong odor - grade I ulcer  Neurological:     General: No focal deficit present.     Mental Status: She is alert and oriented to person, place, and time.  Psychiatric:        Mood and Affect: Mood normal.     Labs on Admission: I have personally reviewed following labs and imaging studies  CBC: Recent Labs  Lab 01/18/22 0926 01/18/22 1218  WBC 26.5* 22.6*  NEUTROABS 25.7*  --   HGB 11.1* 10.3*  HCT 34.4* 33.0*  MCV 93.7 94.6  PLT 496* 009*   Basic Metabolic Panel: Recent Labs  Lab 01/18/22 0926 01/18/22 1218  NA 135  135  K 5.2* 5.2*  CL 101 103  CO2 22 21*  GLUCOSE 128* 99  BUN 40* 40*  CREATININE 1.09* 1.08*  CALCIUM 8.8* 8.4*  MG 1.7 1.8   GFR: Estimated Creatinine Clearance: 39.6 mL/min (A) (by C-G formula based on SCr of 1.08 mg/dL (H)). Liver Function Tests: Recent Labs  Lab 01/18/22 0926  AST 17  ALT 13  ALKPHOS 137*  BILITOT 1.2  PROT 6.1*  ALBUMIN 2.1*   No results for input(s): LIPASE, AMYLASE in the last 168 hours. No results for input(s): AMMONIA in the last 168 hours. Coagulation Profile: Recent Labs  Lab 01/18/22 1521  INR 1.2   Cardiac Enzymes: No results for input(s): CKTOTAL, CKMB, CKMBINDEX, TROPONINI in the last 168 hours. BNP (last 3 results) No results for input(s): PROBNP in the last 8760 hours. HbA1C: No results for input(s): HGBA1C in the last 72 hours. CBG: No results for input(s): GLUCAP in the last 168 hours. Lipid Profile: No results for input(s): CHOL, HDL, LDLCALC, TRIG, CHOLHDL, LDLDIRECT in the last 72 hours. Thyroid Function Tests: Recent Labs    01/18/22 0926  TSH 2.509   Anemia Panel: No results for input(s): VITAMINB12, FOLATE, FERRITIN, TIBC, IRON, RETICCTPCT in the last 72 hours. Urine analysis:    Component Value Date/Time   COLORURINE YELLOW 07/06/2021 1016   APPEARANCEUR CLEAR 07/06/2021 1016   APPEARANCEUR Clear 11/28/2018 1334   LABSPEC 1.009 07/06/2021 1016   PHURINE 5.0 07/06/2021 1016   GLUCOSEU NEGATIVE 07/06/2021 1016   HGBUR SMALL (A) 07/06/2021 1016   BILIRUBINUR NEGATIVE 07/06/2021 1016   BILIRUBINUR Negative 11/28/2018 1334   KETONESUR NEGATIVE 07/06/2021 1016   PROTEINUR NEGATIVE 07/06/2021 1016   UROBILINOGEN negative 01/08/2014 1143   UROBILINOGEN 0.2 03/25/2010 2133   NITRITE NEGATIVE 07/06/2021 1016   LEUKOCYTESUR NEGATIVE 07/06/2021 1016    Radiological Exams on  Admission: I have personally reviewed images DG Chest Port 1 View  Result Date: 01/18/2022 CLINICAL DATA:  Atrial fibrillation EXAM: PORTABLE  CHEST 1 VIEW COMPARISON:  12/31/2021 FINDINGS: Diffuse reticulonodular interstitial disease likely reflecting a combination of metastatic disease and underlying emphysematous changes. No new focal consolidation. No pleural effusion or pneumothorax. Heart and mediastinal contours are unremarkable. No acute osseous abnormality. Right-sided Port-A-Cath in satisfactory position. IMPRESSION: 1. Diffuse reticulonodular interstitial disease likely reflecting a combination of metastatic disease and underlying emphysematous changes. 2. No new focal parenchymal opacity suggests pneumonia. Electronically Signed   By: Kathreen Devoid M.D.   On: 01/18/2022 11:54    EKG: I have personally reviewed EKG: #1 11:21 - A fib with RVR with prolonged QT                                    #2 12:28 - sinus tachycarddia - after amiodarone bolus  Assessment/Plan Principal Problem:   Atrial fibrillation with RVR ([facility]) Active Problems:   Metastatic primary lung cancer ([place])   Hypertension   Malignant neoplasm of upper-inner quadrant of left female breast ([facility])   Decubitus ulcer of sacral region, stage 1   Hyperlipidemia    Assessment and Plan: * Atrial fibrillation with RVR ([place])- (present on admission) Patient with PAF s/p ED visit 12/06/21 and admit 12/31/21. She was seen today at cancer center for weakness and dehydration - probable triggering event. In ED received IV bolus amiodarone and infusion with conversion to Sinus Tach.  Plan Step-down admit while on amiodarone infusion  Continue infusion for 12 hrs and then convert back to 200 mg PO bid  Start low dose diltiazem 30 mg q 6, hold for SBP < 90  Reschedule outpatient f/u with cardiology  Hydrate - D5-1/2NS @ 75 cc/hr  Metastatic primary lung cancer ([place])- (present on admission) Bx defined adenocarcinoma lung with bone, skin and brain mets.   Plan Continue treatment per oncology  Decubitus ulcer of sacral region, stage 1- (present on admission) RN noticed dark  skin change left sacrum when moving the patient. Per patient and family she is in bed a lot and they have noticed the change. On exam : very dark skin 3x7 cm left sacrum, appears superficial  Plan Wound care consult  Malignant neoplasm of upper-inner quadrant of left female breast ([place])- (present on admission) Continue home regimen  Hypertension- (present on admission) Historically with HTN. Not currently taking medication except amiodarone. Has had mild hypotension  Plan Close monitoring with addition of diltiazem for rate control, in addition to amiodarone.  Hyperlipidemia- (present on admission) Lipitor on patients med list but she claims this was stopped.  Plan Continue statin therapy while in-patient  Code status - discussed with patient with her sister present. Completed MOST: DNR, no ICU, DNI, Abx if needed, prolonged IVF trial, Tube feeding trial if needed.      DVT prophylaxis:  TED stocking Code Status: DNR/DNI(Do NOT Intubate) Family Communication: sister present during discussion and exam. Aware of MOST form completion and has it in her possession.  Disposition Plan: home 24-48 hrs  Consults called: none  Admission status: Observation, Step Down Unit   Adella Hare, MD [facility] 01/18/2022, 7:11 PM

## 2022-01-18 NOTE — Assessment & Plan Note (Addendum)
RN noticed dark skin change left sacrum when moving the patient. Per patient and family she is in bed a lot and they have noticed the change. On exam : very dark skin 3x7 cm left sacrum, appears superficial  - Wound care consult-recommend surgical evaluation for debridement -General surgery was consulted, superficial debridement was completed, with recommendation of dressing changes

## 2022-01-18 NOTE — Progress Notes (Signed)
Patient presented today clinic for symptom management. Pt complaining of fatigue. States she is not drinking or eating much. Tarri Abernethy PA-C informed. Will get labs and fluids today per oders and return tomorrow for office visit with PA.

## 2022-01-18 NOTE — Assessment & Plan Note (Addendum)
Bx defined adenocarcinoma lung with bone, skin and brain mets.  -Per patient wishes to discontinue treatment following hospice

## 2022-01-19 ENCOUNTER — Ambulatory Visit (HOSPITAL_COMMUNITY): Payer: Medicare HMO | Admitting: Physician Assistant

## 2022-01-19 ENCOUNTER — Other Ambulatory Visit (HOSPITAL_COMMUNITY): Payer: Medicare HMO

## 2022-01-19 ENCOUNTER — Encounter (HOSPITAL_COMMUNITY): Payer: Self-pay | Admitting: Internal Medicine

## 2022-01-19 ENCOUNTER — Ambulatory Visit (HOSPITAL_COMMUNITY): Payer: Medicare HMO

## 2022-01-19 DIAGNOSIS — S31819A Unspecified open wound of right buttock, initial encounter: Secondary | ICD-10-CM

## 2022-01-19 DIAGNOSIS — I4891 Unspecified atrial fibrillation: Secondary | ICD-10-CM

## 2022-01-19 DIAGNOSIS — Z7189 Other specified counseling: Secondary | ICD-10-CM

## 2022-01-19 DIAGNOSIS — Z515 Encounter for palliative care: Secondary | ICD-10-CM

## 2022-01-19 DIAGNOSIS — C3490 Malignant neoplasm of unspecified part of unspecified bronchus or lung: Secondary | ICD-10-CM | POA: Diagnosis not present

## 2022-01-19 DIAGNOSIS — C7981 Secondary malignant neoplasm of breast: Secondary | ICD-10-CM | POA: Diagnosis not present

## 2022-01-19 DIAGNOSIS — L89151 Pressure ulcer of sacral region, stage 1: Secondary | ICD-10-CM

## 2022-01-19 DIAGNOSIS — I4892 Unspecified atrial flutter: Secondary | ICD-10-CM | POA: Diagnosis not present

## 2022-01-19 DIAGNOSIS — C50212 Malignant neoplasm of upper-inner quadrant of left female breast: Secondary | ICD-10-CM

## 2022-01-19 DIAGNOSIS — L89314 Pressure ulcer of right buttock, stage 4: Secondary | ICD-10-CM | POA: Diagnosis not present

## 2022-01-19 DIAGNOSIS — C7800 Secondary malignant neoplasm of unspecified lung: Secondary | ICD-10-CM

## 2022-01-19 LAB — CBC WITH DIFFERENTIAL/PLATELET
Band Neutrophils: 10 %
Basophils Absolute: 0 10*3/uL (ref 0.0–0.1)
Basophils Relative: 0 %
Eosinophils Absolute: 0 10*3/uL (ref 0.0–0.5)
Eosinophils Relative: 0 %
HCT: 28 % — ABNORMAL LOW (ref 36.0–46.0)
Hemoglobin: 9 g/dL — ABNORMAL LOW (ref 12.0–15.0)
Lymphocytes Relative: 1 %
Lymphs Abs: 0.3 10*3/uL — ABNORMAL LOW (ref 0.7–4.0)
MCH: 31 pg (ref 26.0–34.0)
MCHC: 32.1 g/dL (ref 30.0–36.0)
MCV: 96.6 fL (ref 80.0–100.0)
Metamyelocytes Relative: 17 %
Monocytes Absolute: 0 10*3/uL — ABNORMAL LOW (ref 0.1–1.0)
Monocytes Relative: 0 %
Myelocytes: 1 %
Neutro Abs: 21.1 10*3/uL — ABNORMAL HIGH (ref 1.7–7.7)
Neutrophils Relative %: 71 %
Platelets: 388 10*3/uL (ref 150–400)
RBC: 2.9 MIL/uL — ABNORMAL LOW (ref 3.87–5.11)
RDW: 14.7 % (ref 11.5–15.5)
WBC: 26 10*3/uL — ABNORMAL HIGH (ref 4.0–10.5)
nRBC: 0 % (ref 0.0–0.2)

## 2022-01-19 LAB — BASIC METABOLIC PANEL
Anion gap: 10 (ref 5–15)
BUN: 39 mg/dL — ABNORMAL HIGH (ref 8–23)
CO2: 20 mmol/L — ABNORMAL LOW (ref 22–32)
Calcium: 7.9 mg/dL — ABNORMAL LOW (ref 8.9–10.3)
Chloride: 104 mmol/L (ref 98–111)
Creatinine, Ser: 0.81 mg/dL (ref 0.44–1.00)
GFR, Estimated: >60 mL/min (ref >60)
Glucose, Bld: 126 mg/dL — ABNORMAL HIGH (ref 70–99)
Potassium: 4.6 mmol/L (ref 3.5–5.1)
Sodium: 134 mmol/L — ABNORMAL LOW (ref 135–145)

## 2022-01-19 LAB — PROCALCITONIN: Procalcitonin: 72.34 ng/mL

## 2022-01-19 LAB — MRSA NEXT GEN BY PCR, NASAL: MRSA by PCR Next Gen: NOT DETECTED

## 2022-01-19 LAB — TSH: TSH: 1.037 u[IU]/mL (ref 0.350–4.500)

## 2022-01-19 LAB — RESP PANEL BY RT-PCR (FLU A&B, COVID) ARPGX2
Influenza A by PCR: NEGATIVE
Influenza B by PCR: NEGATIVE
SARS Coronavirus 2 by RT PCR: NEGATIVE

## 2022-01-19 LAB — LACTIC ACID, PLASMA: Lactic Acid, Venous: 1.4 mmol/L (ref 0.5–1.9)

## 2022-01-19 MED ORDER — COLLAGENASE 250 UNIT/GM EX OINT
TOPICAL_OINTMENT | Freq: Every day | CUTANEOUS | Status: DC
  Filled 2022-01-19: qty 30

## 2022-01-19 MED ORDER — MEDIHONEY WOUND/BURN DRESSING EX PSTE
1.0000 "application " | PASTE | Freq: Every day | CUTANEOUS | Status: DC
  Administered 2022-01-19: 1 via CUTANEOUS
  Filled 2022-01-19 (×3): qty 44

## 2022-01-19 MED ORDER — HYDROMORPHONE HCL 1 MG/ML IJ SOLN
1.0000 mg | INTRAMUSCULAR | Status: AC | PRN
  Administered 2022-01-19 (×2): 1 mg via INTRAVENOUS
  Filled 2022-01-19 (×3): qty 1

## 2022-01-19 MED ORDER — AMIODARONE HCL IN DEXTROSE 360-4.14 MG/200ML-% IV SOLN
30.0000 mg/h | INTRAVENOUS | Status: DC
  Administered 2022-01-19: 30 mg/h via INTRAVENOUS
  Filled 2022-01-19 (×2): qty 200

## 2022-01-19 MED ORDER — COLLAGENASE 250 UNIT/GM EX OINT
TOPICAL_OINTMENT | Freq: Two times a day (BID) | CUTANEOUS | Status: DC
  Filled 2022-01-19: qty 30

## 2022-01-19 MED ORDER — ATORVASTATIN CALCIUM 10 MG PO TABS: 10.0000 mg | ORAL_TABLET | Freq: Every day | ORAL | Status: DC

## 2022-01-19 MED ORDER — ORAL CARE MOUTH RINSE
15.0000 mL | Freq: Two times a day (BID) | OROMUCOSAL | Status: DC
  Administered 2022-01-19 – 2022-01-20 (×4): 15 mL via OROMUCOSAL

## 2022-01-19 NOTE — Hospital Course (Signed)
Elizabeth Wu is a 71 y.o. 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 Recent history of A Fib RVR with ED visit 12/06/21 and admit 12/31/21. She was evaluated by cardiology during her admission. She has been started on po Amiodarone for rate control. She is not a candidate for anticoagulation due to brain mets. Other problems include HTN, HLD, tobacco abuse. By report she has had poor PO intake and has had progressive weakness/inanitation who presented to the cancer center for fluid infusioin. She was noted to have mild hypotension and tachycardia. EKG revealed A. Fib with RVR. She was directly referred to ED for tx and further evaluation.  She was started on IV amiodarone.  Patient referred for observation due to A Fib with RVR and hyperkalemia.  ?  ?ED Course: T 97.4  101/79  HR 142  RR 15. Abnormal Lab: K 5.2, BUN 40, Cr 1.08, Ca 8.4, WBC 22.6 no diff done, Hgb 10.3, Plts  442.EKG @ 11:21 - A. Fib with RVR, @ 12.28 Sinus tach. In ED patient received 1L bolus NS, bolus IV amiodarone and continuous infusion. TRH called to admit for continued treatment.  ?

## 2022-01-19 NOTE — Discharge Planning (Signed)
Oncology Discharge Planning Note ? ?[place] at [facility] ?Address: [address] [place], [place] [address] ?Hours of Operation:  8am - 5pm, Monday - Friday  ?Clinic Contact Information:  [phone] ? ?Oncology Care Team: ?Medical Oncologist:  Derek Jack ? ?Patient Details: ?Name:  Elizabeth Wu, Elizabeth Wu ?MRN:   [id number] ?DOB:   02/20/1951 ?Reason for Current Admission: Atrial fibrillation with RVR ([place]) ? ?Discharge Planning Narrative: ?Discharge follow-up appointments for Upon discharge from the hospital, hematology/oncology's post discharge plan of care for the outpatient setting is: Planned for next week for appointment with Elizabeth Wu [facility] in the office, possible IVF and dietary teaching.  Will follow during hospitalization and schedule closer to discharge. ? ?Elizabeth Wu will be called within two business days after discharge to review hematology/oncology's plan of care for full understanding.   ? ?Outpatient Oncology Specific Care Only: ?Oncology appointment transportation needs addressed?:  not applicable ?Oncology medication management for symptom management addressed?:  not applicable ?Chemo Alert Card reviewed?:  not applicable ?Immunotherapy Alert Card reviewed?:  not applicable ? ?

## 2022-01-19 NOTE — Consult Note (Signed)
[facility] Consultation Oncology  Name: Elizabeth Wu      MRN: [id number]    Location: IC07/IC07-01  Date: 01/19/2022 Time:5:29 PM   REFERRING PHYSICIAN: Dr. Roger Shelter  REASON FOR CONSULT: Consideration of palliative care   DIAGNOSIS: Metastatic adenocarcinoma of the lung, bilateral breast cancers.  HISTORY OF PRESENT ILLNESS: [name] is a very pleasant female known to me from my office visits.  She has metastatic adenocarcinoma of the right lung diagnosed around 11/09/2021 and has received 2 doses of Keytruda.  After both doses, she was admitted to the hospital with A-fib and RVR, dehydration and electrolyte imbalances.  She is currently managed for A-fib with RVR with diltiazem and amiodarone.  She also has a decubitus ulcer with a 9 x 7 eschar, status post bedside debridement.  PAST MEDICAL HISTORY:   Past Medical History:  Diagnosis Date   Allergy    Cancer ([place])    Breast   Complication of anesthesia    DJD (degenerative joint disease)    Hyperlipidemia    Hypertension    Osteopenia    PONV (postoperative nausea and vomiting)     ALLERGIES: No Known Allergies    MEDICATIONS: I have reviewed the patient's current medications.     PAST SURGICAL HISTORY Past Surgical History:  Procedure Laterality Date   APPENDECTOMY     BRONCHIAL BIOPSY  11/11/2021   Procedure: BRONCHIAL BIOPSIES;  Surgeon: Collene Gobble, MD;  Location: [facility] ENDOSCOPY;  Service: Pulmonary;;   BRONCHIAL BRUSHINGS  11/11/2021   Procedure: BRONCHIAL BRUSHINGS;  Surgeon: Collene Gobble, MD;  Location: [facility] ENDOSCOPY;  Service: Pulmonary;;   BRONCHIAL NEEDLE ASPIRATION BIOPSY  11/11/2021   Procedure: BRONCHIAL NEEDLE ASPIRATION BIOPSIES;  Surgeon: Collene Gobble, MD;  Location: [facility] ENDOSCOPY;  Service: Pulmonary;;   FIDUCIAL MARKER PLACEMENT  11/11/2021   Procedure: FIDUCIAL MARKER PLACEMENT;  Surgeon: Collene Gobble, MD;  Location: [facility] ENDOSCOPY;  Service: Pulmonary;;   HERNIA REPAIR      PORTACATH PLACEMENT Left 12/10/2021   Procedure: INSERTION PORT-A-CATH;  Surgeon: Virl Cagey, MD;  Location: [facility] ORS;  Service: General;  Laterality: Left;   TONSILLECTOMY     TONSILLECTOMY      FAMILY HISTORY: Family History  Problem Relation Age of Onset   Alzheimer's disease Mother    Cancer Father        stomach cancer   Diabetes Sister    Heart disease Brother        MI    SOCIAL HISTORY:  reports that she has been smoking cigarettes. She has a 16.50 pack-year smoking history. She has never used smokeless tobacco. She reports that she does not drink alcohol and does not use drugs.  PERFORMANCE STATUS: The patient's performance status is 3 - Symptomatic, >50% confined to bed  PHYSICAL EXAM: Most Recent Vital Signs: Blood pressure 110/66, pulse (!) 138, temperature 98.4 F (36.9 C), temperature source Oral, resp. rate (!) 21, height 5\' 4"  (1.626 m), weight 125 lb 10.6 oz (57 kg), SpO2 96 %. BP 110/66    Pulse (!) 138    Temp 98.4 F (36.9 C) (Oral)    Resp (!) 21    Ht 5\' 4"  (1.626 m)    Wt 125 lb 10.6 oz (57 kg)    SpO2 96%    BMI 21.57 kg/m  General appearance: alert, cooperative, and appears stated age Lungs:  Bilateral air entry Heart: irregularly irregular rhythm Neurologic: Grossly normal  LABORATORY DATA:  Results  for orders placed or performed during the hospital encounter of 01/18/22 (from the past 48 hour(s))  Basic metabolic panel     Status: Abnormal   Collection Time: 01/18/22 12:18 PM  Result Value Ref Range   Sodium 135 135 - 145 mmol/L   Potassium 5.2 (H) 3.5 - 5.1 mmol/L   Chloride 103 98 - 111 mmol/L   CO2 21 (L) 22 - 32 mmol/L   Glucose, Bld 99 70 - 99 mg/dL    Comment: Glucose reference range applies only to samples taken after fasting for at least 8 hours.   BUN 40 (H) 8 - 23 mg/dL   Creatinine, Ser 1.08 (H) 0.44 - 1.00 mg/dL   Calcium 8.4 (L) 8.9 - 10.3 mg/dL   GFR, Estimated 55 (L) >60 mL/min    Comment: (NOTE) Calculated using the  CKD-EPI Creatinine Equation (2021)    Anion gap 11 5 - 15    Comment: Performed at [facility], [address], [place], [place] [address]  CBC     Status: Abnormal   Collection Time: 01/18/22 12:18 PM  Result Value Ref Range   WBC 22.6 (H) 4.0 - 10.5 K/uL   RBC 3.49 (L) 3.87 - 5.11 MIL/uL   Hemoglobin 10.3 (L) 12.0 - 15.0 g/dL   HCT 33.0 (L) 36.0 - 46.0 %   MCV 94.6 80.0 - 100.0 fL   MCH 29.5 26.0 - 34.0 pg   MCHC 31.2 30.0 - 36.0 g/dL   RDW 14.3 11.5 - 15.5 %   Platelets 442 (H) 150 - 400 K/uL   nRBC 0.0 0.0 - 0.2 %    Comment: Performed at [facility], [address], [place], [place] [address]  Magnesium     Status: None   Collection Time: 01/18/22 12:18 PM  Result Value Ref Range   Magnesium 1.8 1.7 - 2.4 mg/dL    Comment: Performed at [facility], [address], [place], [place] [address]  Lactic acid, plasma     Status: None   Collection Time: 01/18/22  3:21 PM  Result Value Ref Range   Lactic Acid, Venous 1.8 0.5 - 1.9 mmol/L    Comment: Performed at [facility], [address], [place], [place] [address]  Protime-INR     Status: Abnormal   Collection Time: 01/18/22  3:21 PM  Result Value Ref Range   Prothrombin Time 15.3 (H) 11.4 - 15.2 seconds   INR 1.2 0.8 - 1.2    Comment: (NOTE) INR goal varies based on device and disease states. Performed at [facility], [address] [place], [place] [address]   APTT     Status: None   Collection Time: 01/18/22  3:21 PM  Result Value Ref Range   aPTT 31 24 - 36 seconds    Comment: Performed at [facility], [address], [place], [place] [address]  Blood Culture (routine x 2)     Status: None (Preliminary result)   Collection Time: 01/18/22  3:21 PM   Specimen: BLOOD RIGHT ARM  Result Value Ref Range   Specimen Description BLOOD RIGHT ARM    Special Requests      BOTTLES DRAWN AEROBIC AND ANAEROBIC Blood Culture adequate volume   Culture      NO GROWTH < 24 HOURS Performed at [facility], [address],  [place], [place] [address]    Report Status PENDING   Blood Culture (routine x 2)     Status: None (Preliminary result)   Collection  Time: 01/18/22  3:21 PM   Specimen: BLOOD RIGHT ARM  Result Value Ref Range   Specimen Description BLOOD RIGHT ARM    Special Requests      BOTTLES DRAWN AEROBIC AND ANAEROBIC Blood Culture adequate volume   Culture      NO GROWTH < 24 HOURS Performed at [facility], [address] [place], [place] [address]    Report Status PENDING   MRSA Next Gen by PCR, Nasal     Status: None   Collection Time: 01/18/22 10:45 PM   Specimen: Nasal Mucosa; Nasal Swab  Result Value Ref Range   MRSA by PCR Next Gen NOT DETECTED NOT DETECTED    Comment: (NOTE) The GeneXpert MRSA Assay (FDA approved for NASAL specimens only), is one component of a comprehensive MRSA colonization surveillance program. It is not intended to diagnose MRSA infection nor to guide or monitor treatment for MRSA infections. Test performance is not FDA approved in patients less than 47 years old. Performed at [facility], [address] [place], [place] [address]   Resp Panel by RT-PCR (Flu A&B, Covid) Nasopharyngeal Swab     Status: None   Collection Time: 01/18/22 11:06 PM   Specimen: Nasopharyngeal Swab; Nasopharyngeal(NP) swabs in vial transport medium  Result Value Ref Range   SARS Coronavirus 2 by RT PCR NEGATIVE NEGATIVE    Comment: (NOTE) SARS-CoV-2 target nucleic acids are NOT DETECTED.  The SARS-CoV-2 RNA is generally detectable in upper respiratory specimens during the acute phase of infection. The lowest concentration of SARS-CoV-2 viral copies this assay can detect is 138 copies/mL. A negative result does not preclude SARS-Cov-2 infection and should not be used as the sole basis for treatment or other patient management decisions. A negative result may occur with  improper specimen collection/handling, submission of specimen other than nasopharyngeal swab, presence of viral  mutation(s) within the areas targeted by this assay, and inadequate number of viral copies(<138 copies/mL). A negative result must be combined with clinical observations, patient history, and epidemiological information. The expected result is Negative.  Fact Sheet for Patients:  EntrepreneurPulse.com.au  Fact Sheet for Healthcare Providers:  IncredibleEmployment.be  This test is no t yet approved or cleared by the [place] FDA and  has been authorized for detection and/or diagnosis of SARS-CoV-2 by FDA under an Emergency Use Authorization (EUA). This EUA will remain  in effect (meaning this test can be used) for the duration of the COVID-19 declaration under Section 564(b)(1) of the Act, 21 U.S.C.section 360bbb-3(b)(1), unless the authorization is terminated  or revoked sooner.       Influenza A by PCR NEGATIVE NEGATIVE   Influenza B by PCR NEGATIVE NEGATIVE    Comment: (NOTE) The Xpert Xpress SARS-CoV-2/FLU/RSV plus assay is intended as an aid in the diagnosis of influenza from Nasopharyngeal swab specimens and should not be used as a sole basis for treatment. Nasal washings and aspirates are unacceptable for Xpert Xpress SARS-CoV-2/FLU/RSV testing.  Fact Sheet for Patients: EntrepreneurPulse.com.au  Fact Sheet for Healthcare Providers: IncredibleEmployment.be  This test is not yet approved or cleared by the [place] FDA and has been authorized for detection and/or diagnosis of SARS-CoV-2 by FDA under an Emergency Use Authorization (EUA). This EUA will remain in effect (meaning this test can be used) for the duration of the COVID-19 declaration under Section 564(b)(1) of the Act, 21 U.S.C. section 360bbb-3(b)(1), unless the authorization is terminated or revoked.  Performed at [facility], [address], [place], [place]  [address]   Basic metabolic panel     Status: Abnormal   Collection  Time: 01/19/22  3:55 AM  Result Value Ref Range   Sodium 134 (L) 135 - 145 mmol/L   Potassium 4.6 3.5 - 5.1 mmol/L   Chloride 104 98 - 111 mmol/L   CO2 20 (L) 22 - 32 mmol/L   Glucose, Bld 126 (H) 70 - 99 mg/dL    Comment: Glucose reference range applies only to samples taken after fasting for at least 8 hours.   BUN 39 (H) 8 - 23 mg/dL   Creatinine, Ser 0.81 0.44 - 1.00 mg/dL   Calcium 7.9 (L) 8.9 - 10.3 mg/dL   GFR, Estimated >60 >60 mL/min    Comment: (NOTE) Calculated using the CKD-EPI Creatinine Equation (2021)    Anion gap 10 5 - 15    Comment: Performed at [facility], [address], [place], [place] [address]  TSH     Status: None   Collection Time: 01/19/22  3:55 AM  Result Value Ref Range   TSH 1.037 0.350 - 4.500 uIU/mL    Comment: Performed by a 3rd Generation assay with a functional sensitivity of <=0.01 uIU/mL. Performed at [facility], [address] [place], [place] [address]   CBC with Differential/Platelet     Status: Abnormal   Collection Time: 01/19/22  3:55 AM  Result Value Ref Range   WBC 26.0 (H) 4.0 - 10.5 K/uL   RBC 2.90 (L) 3.87 - 5.11 MIL/uL   Hemoglobin 9.0 (L) 12.0 - 15.0 g/dL   HCT 28.0 (L) 36.0 - 46.0 %   MCV 96.6 80.0 - 100.0 fL   MCH 31.0 26.0 - 34.0 pg   MCHC 32.1 30.0 - 36.0 g/dL   RDW 14.7 11.5 - 15.5 %   Platelets 388 150 - 400 K/uL   nRBC 0.0 0.0 - 0.2 %   Neutrophils Relative % 71 %   Neutro Abs 21.1 (H) 1.7 - 7.7 K/uL   Band Neutrophils 10 %   Lymphocytes Relative 1 %   Lymphs Abs 0.3 (L) 0.7 - 4.0 K/uL   Monocytes Relative 0 %   Monocytes Absolute 0.0 (L) 0.1 - 1.0 K/uL   Eosinophils Relative 0 %   Eosinophils Absolute 0.0 0.0 - 0.5 K/uL   Basophils Relative 0 %   Basophils Absolute 0.0 0.0 - 0.1 K/uL   WBC Morphology MILD LEFT SHIFT (1-5% METAS, OCC MYELO, OCC BANDS)     Comment: VACUOLATED NEUTROPHILS   RBC Morphology MORPHOLOGY UNREMARKABLE    Smear Review MORPHOLOGY UNREMARKABLE    Metamyelocytes Relative 17 %    Myelocytes 1 %    Comment: Performed at [facility], [address], [place], [place] [address]  Procalcitonin - Baseline     Status: None   Collection Time: 01/19/22  3:55 AM  Result Value Ref Range   Procalcitonin 72.34 ng/mL    Comment:        Interpretation: PCT >= 10 ng/mL: Important systemic inflammatory response, almost exclusively due to severe bacterial sepsis or septic shock. (NOTE)       Sepsis PCT Algorithm           Lower Respiratory Tract                                      Infection PCT Algorithm    ----------------------------     ----------------------------  PCT < 0.25 ng/mL                PCT < 0.10 ng/mL          Strongly encourage             Strongly discourage   discontinuation of antibiotics    initiation of antibiotics    ----------------------------     -----------------------------       PCT 0.25 - 0.50 ng/mL            PCT 0.10 - 0.25 ng/mL               OR       >80% decrease in PCT            Discourage initiation of                                            antibiotics      Encourage discontinuation           of antibiotics    ----------------------------     -----------------------------         PCT >= 0.50 ng/mL              PCT 0.26 - 0.50 ng/mL                AND       <80% decrease in PCT             Encourage initiation of                                             antibiotics       Encourage continuation           of antibiotics    ----------------------------     -----------------------------        PCT >= 0.50 ng/mL                  PCT > 0.50 ng/mL               AND         increase in PCT                  Strongly encourage                                      initiation of antibiotics    Strongly encourage escalation           of antibiotics                                     -----------------------------                                           PCT <= 0.25 ng/mL  OR                                         > 80% decrease in PCT                                      Discontinue / Do not initiate                                             antibiotics  Performed at [facility], [address] [place], [place] [address]   Lactic acid, plasma     Status: None   Collection Time: 01/19/22  7:45 AM  Result Value Ref Range   Lactic Acid, Venous 1.4 0.5 - 1.9 mmol/L    Comment: Performed at [facility], [address], [place], [place] [address]      RADIOGRAPHY: DG Chest Port 1 View  Result Date: 01/18/2022 CLINICAL DATA:  Atrial fibrillation EXAM: PORTABLE CHEST 1 VIEW COMPARISON:  12/31/2021 FINDINGS: Diffuse reticulonodular interstitial disease likely reflecting a combination of metastatic disease and underlying emphysematous changes. No new focal consolidation. No pleural effusion or pneumothorax. Heart and mediastinal contours are unremarkable. No acute osseous abnormality. Right-sided Port-A-Cath in satisfactory position. IMPRESSION: 1. Diffuse reticulonodular interstitial disease likely reflecting a combination of metastatic disease and underlying emphysematous changes. 2. No new focal parenchymal opacity suggests pneumonia. Electronically Signed   By: Kathreen Devoid M.D.   On: 01/18/2022 11:54        ASSESSMENT and PLAN:  1.  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 - She has developed dehydration and admitted with A-fib with RVR. - I had a prolonged discussion with the patient, her sister and her grandson. - I do not believe that she can tolerate continuing of treatments at this time. - We talked about palliative care in the form of hospice. - Patient lives at home with her partner who is not healthy to take care of her.  She does require 24/7 support.  I think she would benefit from inpatient hospice facility rather than at home. - Patient, and her family are agreeable. - I will discuss with Dr.  Roger Shelter.  All questions were answered. The patient knows to call the clinic with any problems, questions or concerns. We can certainly see the patient much sooner if necessary.   Derek Jack

## 2022-01-19 NOTE — Care Management Obs Status (Signed)
MEDICARE OBSERVATION STATUS NOTIFICATION ? ? ?Patient Details  ?Name: Elizabeth Wu ?MRN: [id number] ?Date of Birth: 1950/12/02 ? ? ?Medicare Observation Status Notification Given:  Yes ? ? ? ?Tommy Medal ?01/19/2022, 3:41 PM ?

## 2022-01-19 NOTE — TOC Progression Note (Signed)
Transition of Care (TOC) - Progression Note  ? ? ?Patient Details  ?Name: Elizabeth Wu ?MRN: [id number] ?Date of Birth: [birth year] ? ?Transition of Care (TOC) CM/SW Contact  ?Salome Arnt, LCSW ?Phone Number: ?01/19/2022, 7:50 AM ? ?Clinical Narrative:  TOC received consult to assist pt with HCPOA. Chaplain notified.   ? ? ? ?  ?  ? ?Expected Discharge Plan and Services ?  ?  ?  ?  ?  ?                ?  ?  ?  ?  ?  ?  ?  ?  ?  ?  ? ? ?Social Determinants of Health (SDOH) Interventions ?  ? ?Readmission Risk Interventions ?No flowsheet data found. ? ?

## 2022-01-19 NOTE — Consult Note (Signed)
Consultation Note Date: 01/19/2022   Patient Name: Elizabeth Wu  DOB: [birth year]  MRN: [id number]  Age / Sex: 71 y.o., female  PCP: [name], DO Referring Physician: Deatra James, MD  Reason for Consultation: Establishing goals of care  HPI/Patient Profile: 71 y.o. 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   Clinical Assessment and Goals of Care: I have reviewed medical records including EPIC notes, labs and imaging, received report from RN.  Elizabeth Wu is lying quietly in bed.  She meets, making and somewhat keeping eye contact.  She appears acutely/chronically ill and quite frail.   She is alert and oriented, able to make her basic needs known.  Nursing staff is at bedside attending to wound care.  [name] tells me that she is unmarried and has no children.  When asked what brought her into the hospital she tells me her heart.  Bedside nursing staff starts sacral wound care.  Meeting with healthcare surrogate/sister, Elizabeth Wu, outside the room to discuss diagnosis prognosis, [place], EOL wishes, disposition and options.  I introduced Palliative Medicine as specialized medical care for people living with serious illness. It focuses on providing relief from the symptoms and stress of a serious illness. The goal is to improve quality of life for both the patient and the family.  Elizabeth Wu shares that [facility] was to start  outpatient palliative services March 7.  We discussed a brief life review of the patient.  Elizabeth Wu is unmarried and has no children.  She has lived with [name] for 20+ years and helped raise Elizabeth grandchild.  [name] is not her biological grandson, but she calls him her grandson.  Elizabeth Wu shares that Elizabeth Wu has been weaker since taking immunotherapy, she shares that [name] was hospitalized after each treatment.  Conference with attending, bedside nursing staff, transition of care team and attending related to patient condition and needs.  I returned to the room to speak with [name] and Elizabeth Wu together.  We focused on their current illness.  We talked about her increased heart rate and low blood pressure, we talk about her sacral wound and consultation with surgeon for recommendations and possible bedside debridement, we talked about cancer treatment and awaiting recommendations from trusted oncologist.  The natural disease trajectory and expectations at EOL were discussed.  I attempted to elicit values and goals of care important to the patient.  I share that they are facing some hard choices over the next few days.  The difference between aggressive medical intervention and comfort care was considered in light of the patient's goals of care.   Advanced directives,  concepts specific to code status, were considered and discussed.  [name] is DNR.  Hospice and Palliative Care services outpatient were explained and offered.  Elizabeth Wu states that [facility] hospice was to start outpatient palliative services on March 7.  We talk about the benefits of at home hospice and residential hospice.  We talked about qualifications for residential hospice.  Discussed the importance of continued conversation with family and the medical providers regarding overall plan of care and treatment options, ensuring decisions are within the context of the patients values and GOCs.  Questions and concerns were  addressed.  The patient and family was encouraged to call with questions or concerns.  PMT will continue to support holistically.  Conference with attending, bedside nursing staff, transition of care team related to patient condition, needs, goals of care, disposition.  HCPOA NEXT OF KIN -Elizabeth Wu is unmarried and has no children.  Her parents are deceased.  Her sister, Elizabeth Wu, is her healthcare surrogate.  [name] has lived with [name] for approximately 20 years, and helped raise Elizabeth grandchildren.  [name] is Elizabeth grandson.    SUMMARY OF RECOMMENDATIONS   At this point continue to treat the treatable, but no CPR or intubation. Time for outcomes Awaiting input from trusted oncologist.   Code Status/Advance Care Planning: DNR  Symptom Management:  Per hospitalist, no additional needs at this time.  Palliative Prophylaxis:  Frequent Pain Assessment, Oral Care, and Palliative Wound Care  Additional Recommendations (Limitations, Scope, Preferences): At this point continue to treat the treatable but no CPR or intubation.  Psycho-social/Spiritual:  Desire for further Chaplaincy support:no Additional Recommendations: Caregiving  Support/Resources and Education on Hospice  Prognosis:  < 4 weeks, would not be surprising based on metastatic cancer burden, hospitalizations after each immunotherapy treatment, large sacral wound, weight loss with declining functional status.  Discharge Planning:  To be determined, based on outcomes and patient choice.       Primary Diagnoses: Present on Admission:  Hyperlipidemia  Hypertension  Atrial fibrillation with RVR ([place])  Metastatic primary lung cancer ([name])  Malignant neoplasm of upper-inner quadrant of left female breast ([name])  (Resolved) Malignant neoplasm of upper-outer quadrant of left female breast ([name])  Decubitus ulcer of sacral region, stage 1   I have reviewed the medical record, interviewed the patient and family, and  examined the patient. The following aspects are pertinent.  Past Medical History:  Diagnosis Date   Allergy    Cancer ([name])    Breast   Complication of anesthesia    DJD (degenerative joint disease)    Hyperlipidemia    Hypertension    Osteopenia    PONV (postoperative nausea and vomiting)    Social History   Socioeconomic History   Marital status: Single    Spouse name: Not on file   Number of children: Not on file   Years of education: Not on file   Highest education level: 12th grade  Occupational History   Occupation: [employer]  Tobacco Use   Smoking status: Every Day    Packs/day: 0.30    Years: 55.00    Pack years: 16.50    Types: Cigarettes   Smokeless tobacco: Never  Vaping Use   Vaping Use: Never used  Substance and Sexual Activity   Alcohol use: No   Drug use: No   Sexual activity: Not on file  Other Topics Concern   Not on file  Social History Narrative   Not on file   Social  Determinants of Health   Financial Resource Strain: Not on file  Food Insecurity: Not on file  Transportation Needs: Not on file  Physical Activity: Not on file  Stress: Not on file  Social Connections: Not on file   Family History  Problem Relation Age of Onset   Alzheimer's disease Mother    Cancer Father        stomach cancer   Diabetes Sister    Heart disease Brother        MI   Scheduled Meds:  anastrozole  1 mg Oral Daily   atorvastatin  10 mg Oral QHS   Chlorhexidine Gluconate Cloth  6 each Topical Daily   docusate sodium  100 mg Oral BID   feeding supplement  237 mL Oral BID BM   Medihoney Wound/Burn Dressing  1 application Apply externally Daily   mouth rinse  15 mL Mouth Rinse BID   megestrol  400 mg Oral BID   oxyCODONE  10 mg Oral Q6H   Continuous Infusions:  sodium chloride 75 mL/hr at 01/19/22 0826   amiodarone     PRN Meds:.acetaminophen, bisacodyl, HYDROmorphone (DILAUDID) injection, ibuprofen, ondansetron (ZOFRAN) IV,  prochlorperazine Medications Prior to Admission:  Prior to Admission medications   Medication Sig Start Date End Date Taking? Authorizing Provider  amiodarone (PACERONE) 200 MG tablet Take 1 tablet (200 mg total) by mouth 2 (two) times daily. 01/01/22  Yes Dessa Phi, DO  anastrozole (ARIMIDEX) 1 MG tablet Take 1 tablet (1 mg total) by mouth daily. 11/18/21  Yes Derek Jack, MD  bisacodyl (DULCOLAX) 10 MG suppository Place 1 suppository (10 mg total) rectally as needed for moderate constipation. 12/28/21  Yes Pennington, Rebekah M, PA-C  Cholecalciferol (VITAMIN D) 50 MCG (2000 UT) tablet Take 2,000 Units by mouth daily.   Yes [provider]  cyanocobalamin (,VITAMIN B-12,) 1000 MCG/ML injection Inject 1,000 mcg into the muscle every 30 (thirty) days.   Yes [provider]  docusate sodium (COLACE) 100 MG capsule Take 100 mg by mouth 2 (two) times daily.   Yes [provider]  Ensure (ENSURE) Take 237 mLs by mouth 2 (two) times daily between meals.   Yes [provider]  folic acid (FOLVITE) 1 MG tablet Take 1 mg by mouth daily.   Yes [provider]  IBU 600 MG tablet Take 600 mg by mouth every 6 (six) hours as needed for headache, fever or mild pain. 01/07/22  Yes [provider]  megestrol (MEGACE) 400 MG/10ML suspension Take 10 mLs (400 mg total) by mouth 2 (two) times daily. 11/18/21  Yes Derek Jack, MD  Omega-3 Fatty Acids (FISH OIL) 1000 MG CAPS Take 1,000 mg by mouth daily.   Yes [provider]  Oxycodone HCl 10 MG TABS Take 10 mg by mouth 3 (three) times daily as needed. 01/11/22  Yes [provider]  oxyCODONE-acetaminophen (PERCOCET) 10-325 MG tablet TAKE 1 TABLET EVERY 4 HOURS AS NEEDED FOR PAIN 01/11/22  Yes Pennington, Rebekah M, PA-C  potassium chloride SA (KLOR-CON M) 20 MEQ tablet Take 1 tablet (20 mEq total) by mouth 2 (two) times daily. 01/11/22  Yes Derek Jack, MD   prochlorperazine (COMPAZINE) 10 MG tablet Take 1 tablet (10 mg total) by mouth every 6 (six) hours as needed for nausea or vomiting. 10/25/21  Yes Derek Jack, MD  simvastatin (ZOCOR) 20 MG tablet TAKE 1 TABLET AT BEDTIME 01/07/22  Yes Gottschalk, Leatrice Jewels M, DO  Misc. Devices MISC Please provide with  wheelchair; dx metastatic lung cancer 12/13/21   Derek Jack, MD   No Known Allergies Review of Systems  Unable to perform ROS: Acuity of condition   Physical Exam Vitals and nursing note reviewed.  Constitutional:      General: She is not in acute distress.    Appearance: She is ill-appearing.  HENT:     Mouth/Throat:     Mouth: Mucous membranes are moist.  Cardiovascular:     Rate and Rhythm: Tachycardia present.  Pulmonary:     Effort: Pulmonary effort is normal. No respiratory distress.  Skin:    General: Skin is warm and dry.  Neurological:     Mental Status: She is alert and oriented to person, place, and time.  Psychiatric:        Mood and Affect: Mood normal.        Behavior: Behavior normal.    Vital Signs: BP 132/71    Pulse (!) 146    Temp 97.8 F (36.6 C) (Axillary)    Resp (!) 22    Ht 5\' 4"  (1.626 m)    Wt 57 kg    SpO2 96%    BMI 21.57 kg/m  Pain Scale: CPOT POSS *See Group Information*: 1-Acceptable,Awake and alert Pain Score: 2    SpO2: SpO2: 96 % O2 Device:SpO2: 96 % O2 Flow Rate: .   IO: Intake/output summary:  Intake/Output Summary (Last 24 hours) at 01/19/2022 1350 Last data filed at 01/19/2022 0254 Gross per 24 hour  Intake 2492.94 ml  Output --  Net 2492.94 ml    LBM:   Baseline Weight: Weight: 51.7 kg Most recent weight: Weight: 57 kg     Palliative Assessment/Data:   Flowsheet Rows    Flowsheet Row Most Recent Value  Intake Tab   Referral Department Hospitalist  Unit at Time of Referral Intermediate Care Unit  Palliative Care Primary Diagnosis Cardiac  Date Notified 01/19/22  Palliative Care Type New Palliative care   Reason for referral Clarify Goals of Care  Date of Admission 01/18/22  Date first seen by Palliative Care 01/19/22  # of days Palliative referral response time 0 Day(s)  # of days IP prior to Palliative referral 1  Clinical Assessment   Palliative Performance Scale Score 30%  Pain Max last 24 hours Not able to report  Pain Min Last 24 hours Not able to report  Dyspnea Max Last 24 Hours Not able to report  Dyspnea Min Last 24 hours Not able to report  Psychosocial & Spiritual Assessment   Palliative Care Outcomes        Time In: 1000 Time Out: 1115 Time Total: 75 minutes  Greater than 50%  of this time was spent counseling and coordinating care related to the above assessment and plan.  Signed by: Drue Novel, NP   Please contact Palliative Medicine Team phone at [phone] for questions and concerns.  For individual provider: See Shea Evans

## 2022-01-19 NOTE — Consult Note (Addendum)
Cardiology Consultation:   Patient ID: Elizabeth Wu MRN: [id number]; DOB: 1951-07-25  Admit date: 01/18/2022 Date of Consult: 01/19/2022  PCP:  [name], DO   [facility] Providers Cardiologist:  Candee Furbish, MD        Patient Profile:   Elizabeth Wu is a 71 y.o. female with a hx of paroxysmal aflutter, metatastic breast cancer who is being seen 01/19/2022 for the evaluation of tachycardia  at the request of Dr Roger Shelter.  History of Present Illness:   [name] 71 yo female history of HTN, HL, aflutter diagnosed Jan 2023 not on anticoag due to brain mets, metastatic lung cancer presents with low blood pressure and tachycardia.    Admit 12/31/21 with recurrent aflutter with RVR. BP's have limited av nodal agent dosing. Was loaded with IV amiodarone during that admission and then coverted to oral. Appears coverted with bolus alone and was converted to oral, did not have 24 hr load.     Mg 1.7 TSH 2.5  K 5.2 BUN 40 Cr 1.09 WBC 26.5 Hgb 11.1 Plt 496 Procalc 72 Lactic acid 1.8 CXR diffuse reticulonodular interstitial disease EKG aflutter 140 Past Medical History:  Diagnosis Date   Allergy    Cancer ([place])    Breast   Complication of anesthesia    DJD (degenerative joint disease)    Hyperlipidemia    Hypertension    Osteopenia    PONV (postoperative nausea and vomiting)     Past Surgical History:  Procedure Laterality Date   APPENDECTOMY     BRONCHIAL BIOPSY  11/11/2021   Procedure: BRONCHIAL BIOPSIES;  Surgeon: Collene Gobble, MD;  Location: [facility];  Service: Pulmonary;;   BRONCHIAL BRUSHINGS  11/11/2021   Procedure: BRONCHIAL BRUSHINGS;  Surgeon: Collene Gobble, MD;  Location: [facility];  Service: Pulmonary;;   BRONCHIAL NEEDLE ASPIRATION BIOPSY  11/11/2021   Procedure: BRONCHIAL NEEDLE ASPIRATION BIOPSIES;  Surgeon: Collene Gobble, MD;  Location: [facility];  Service: Pulmonary;;   FIDUCIAL MARKER PLACEMENT  11/11/2021   Procedure: FIDUCIAL  MARKER PLACEMENT;  Surgeon: Collene Gobble, MD;  Location: [facility];  Service: Pulmonary;;   HERNIA REPAIR     PORTACATH PLACEMENT Left 12/10/2021   Procedure: INSERTION PORT-A-CATH;  Surgeon: Virl Cagey, MD;  Location: [facility];  Service: General;  Laterality: Left;   TONSILLECTOMY     TONSILLECTOMY        Inpatient Medications: Scheduled Meds:  amiodarone  200 mg Oral Daily   anastrozole  1 mg Oral Daily   atorvastatin  10 mg Oral QHS   Chlorhexidine Gluconate Cloth  6 each Topical Daily   diltiazem  30 mg Oral Q6H   docusate sodium  100 mg Oral BID   feeding supplement  237 mL Oral BID BM   mouth rinse  15 mL Mouth Rinse BID   megestrol  400 mg Oral BID   oxyCODONE  10 mg Oral Q6H   Continuous Infusions:  sodium chloride 75 mL/hr at 01/19/22 0826   PRN Meds: acetaminophen, bisacodyl, HYDROmorphone (DILAUDID) injection, ibuprofen, ondansetron (ZOFRAN) IV, prochlorperazine  Allergies:   No Known Allergies  Social History:   Social History   Socioeconomic History   Marital status: Single    Spouse name: Not on file   Number of children: Not on file   Years of education: Not on file   Highest education level: 12th grade  Occupational History   Occupation: Quality Control  Tobacco Use  Smoking status: Every Day    Packs/day: 0.30    Years: 55.00    Pack years: 16.50    Types: Cigarettes   Smokeless tobacco: Never  Vaping Use   Vaping Use: Never used  Substance and Sexual Activity   Alcohol use: No   Drug use: No   Sexual activity: Not on file  Other Topics Concern   Not on file  Social History Narrative   Not on file   Social Determinants of Health   Financial Resource Strain: Not on file  Food Insecurity: Not on file  Transportation Needs: Not on file  Physical Activity: Not on file  Stress: Not on file  Social Connections: Not on file  Intimate Partner Violence: Not on file    Family History:    Family History  Problem Relation Age of  Onset   Alzheimer's disease Mother    Cancer Father        stomach cancer   Diabetes Sister    Heart disease Brother        MI     ROS:  Please see the history of present illness.   All other ROS reviewed and negative.     Physical Exam/Data:   Vitals:   01/19/22 0600 01/19/22 0700 01/19/22 0734 01/19/22 0800  BP: (!) 98/53 (!) 137/52  107/63  Pulse: 90 (!) 133 (!) 140 (!) 140  Resp: 14 12 19 17   Temp:   (!) 97.5 F (36.4 C)   TempSrc:   Oral   SpO2: 94% 95% 95% 92%  Weight:      Height:        Intake/Output Summary (Last 24 hours) at 01/19/2022 0836 Last data filed at 01/19/2022 0826 Gross per 24 hour  Intake 2583 ml  Output --  Net 2583 ml   Last 3 Weights 01/19/2022 01/18/2022 01/11/2022  Weight (lbs) 125 lb 10.6 oz 114 lb 114 lb 6.4 oz  Weight (kg) 57 kg 51.71 kg 51.891 kg     Body mass index is 21.57 kg/m.  General:  Well nourished, well developed, in no acute distress HEENT: normal Neck: no JVD Vascular: No carotid bruits; Distal pulses 2+ bilaterally Cardiac:  regular, tachy Lungs:  clear to auscultation bilaterally, no wheezing, rhonchi or rales  Abd: soft, nontender, no hepatomegaly  Ext: no edema Musculoskeletal:  No deformities, BUE and BLE strength normal and equal Skin: warm and dry  Neuro:  CNs 2-12 intact, no focal abnormalities noted Psych:  Normal affect      Laboratory Data:  High Sensitivity Troponin:   Recent Labs  Lab 12/31/21 1001 12/31/21 1138  TROPONINIHS 138* 124*     Chemistry Recent Labs  Lab 01/18/22 0926 01/18/22 1218 01/19/22 0355  NA 135 135 134*  K 5.2* 5.2* 4.6  CL 101 103 104  CO2 22 21* 20*  GLUCOSE 128* 99 126*  BUN 40* 40* 39*  CREATININE 1.09* 1.08* 0.81  CALCIUM 8.8* 8.4* 7.9*  MG 1.7 1.8  --   GFRNONAA 55* 55* >60  ANIONGAP 12 11 10     Recent Labs  Lab 01/18/22 0926  PROT 6.1*  ALBUMIN 2.1*  AST 17  ALT 13  ALKPHOS 137*  BILITOT 1.2   Lipids No results for input(s): CHOL, TRIG, HDL, LABVLDL,  LDLCALC, CHOLHDL in the last 168 hours.  Hematology Recent Labs  Lab 01/18/22 0926 01/18/22 1218 01/19/22 0355  WBC 26.5* 22.6* 26.0*  RBC 3.67* 3.49* 2.90*  HGB 11.1* 10.3* 9.0*  HCT 34.4* 33.0* 28.0*  MCV 93.7 94.6 96.6  MCH 30.2 29.5 31.0  MCHC 32.3 31.2 32.1  RDW 14.2 14.3 14.7  PLT 496* 442* 388   Thyroid  Recent Labs  Lab 01/19/22 0355  TSH 1.037    BNPNo results for input(s): BNP, PROBNP in the last 168 hours.  DDimer No results for input(s): DDIMER in the last 168 hours.   Radiology/Studies:  DG Chest Port 1 View  Result Date: 01/18/2022 CLINICAL DATA:  Atrial fibrillation EXAM: PORTABLE CHEST 1 VIEW COMPARISON:  12/31/2021 FINDINGS: Diffuse reticulonodular interstitial disease likely reflecting a combination of metastatic disease and underlying emphysematous changes. No new focal consolidation. No pleural effusion or pneumothorax. Heart and mediastinal contours are unremarkable. No acute osseous abnormality. Right-sided Port-A-Cath in satisfactory position. IMPRESSION: 1. Diffuse reticulonodular interstitial disease likely reflecting a combination of metastatic disease and underlying emphysematous changes. 2. No new focal parenchymal opacity suggests pneumonia. Electronically Signed   By: Kathreen Devoid M.D.   On: 01/18/2022 11:54     Assessment and Plan:   1.Aflutter with RVR -recent issues with recurrent aflutter with RVR over the last few months - has not been on anticoag due to brain mets/breast cancer - admit 12/2021 received amio IV bolus then coverted to oral to complete load, does not appear had full 24 hr load IV at that time.  - presents today with recurrent aflutter with RVR, started back on IV amio. WOuld plan for full 24+ hr load then conversion back to oral - based on wbc and procalc perhaps aflutter exacerbated by infection, defer workup to primary team.    2.AKI - notes mention poor oral intake at home, likely prerenal   3.Leukocytosis/elevated  procalcitonin - per primary team  4. Metastatic breast cancer - followed by palliative this admission.  Risk Assessment/Risk Scores:    For questions or updates, please contact [place] Please consult [email or web address] for contact info under    Signed, Carlyle Dolly, MD  01/19/2022 8:36 AM

## 2022-01-19 NOTE — Progress Notes (Signed)
PT Cancellation Note ? ?Patient Details ?Name: Elizabeth Wu ?MRN: [id number] ?DOB: [birth year] ? ? ?Cancelled Treatment:    Reason Eval/Treat Not Completed: Medical issues which prohibited therapy.  Patient's HR in 140-150's, therapy held will check back when stable - RN aware. ? ?3:22 PM, 01/19/22 ?Lonell Grandchild, MPT ?Physical Therapist with [facility] ?[facility] ?[phone] office ?[phone] mobile phone ? ?

## 2022-01-19 NOTE — Procedures (Signed)
Bedside procedure note ?  ?Preoperative Diagnosis: Unstageable right buttock decubitus ulcer ?Postoperative Diagnosis: Stage IV right buttock decubitus ulcer ?  ?Procedure(s) Performed: Excisional debridement of right buttock decubitus ulcer ?  ?Performing provider: Graciella Freer, DO ?  ?Estimated Blood Loss: Minimal ?  ?Findings: Stage IV right buttock decubitus ulcer, no evidence of subcutaneous infection on debridement ?  ?Procedure: At bedside, right buttock wound was prepped with Betadine and draped with OR towels.  Verbal consent obtained from the patient and the patient's sister prior to beginning of procedure.  Timeout was performed.  Using scalpel, necrotic tissue was removed from the decubitus ulcer.  The wound was unable to be debrided to healthy tissue given the extent of the wound.  Hemostasis was noted.  The wound was then packed with saline dampened Kerlix, covered with ABD and foam tape.  The final measurements of the wound were 9 cm x 7 cm x 4 cm.  Patient tolerated the procedure without issue. ? ?-Discussed with patient and sister that 1 was unable to be debrided to healthy tissue, as it was quite extensive.  Debridement to healthy tissue would require OR debridement and likely diverting ostomy, and both patient and sister agree that they would not want this given her current prognosis. ?  ? ?Alencia Gordon, DO ?[facility] ?[address] [place], [place] [address] ?[phone] (office) ? ? ?

## 2022-01-19 NOTE — Consult Note (Addendum)
WOC Nurse Consult Note: ?Reason for Consult: Consult requested for right buttock wound.  Performed using remote camera in ICU and assessment and measurement assistance from the bedside nurse. 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  Pt could benefit from possible bedside debridement of nonviable tissue; Secure chat message sent to primary team for them to request a surgical consult if they agree.  Pt is critically ill in ICU with multiple systemic factors which can impair healing.  She is on a low airloss mattress to decrease pressure.  ?Pressure Injury POA: Yes ?Measurement: 9X7cm ?Dressing procedure/placement/frequency: Topical treatment orders provided for bedside nurses to perform as follows to assist with enzymatic debridement: Apply Santyl to right buttock wound Q day, then cover with moist gauze and ABD pad and tape. ?Please re-consult if further assistance is needed.  Thank-you,  ?Julien Girt MSN, RN, [place], [place], CNS ?[phone]  ? ?  ?

## 2022-01-19 NOTE — Progress Notes (Signed)
PROGRESS NOTE    Patient: Elizabeth Wu                            PCP: [name], DO                    DOB: 1951-09-13            DOA: 01/18/2022 ZGY:[id number]             DOS: 01/19/2022, 12:14 PM   LOS: 0 days   Date of Service: The patient was seen and examined on 01/19/2022  Subjective:   The patient was seen and examined this morning. Patient is more awake, following command, remained hypotensive, tachycardic in A-fib with RVR Not complaining of any pain only palpitation  Sister present at bedside   Brief Narrative:   Elizabeth Wu is a 71 y.o. 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 Recent history of A Fib RVR with ED visit 12/06/21 and admit 12/31/21. She was evaluated by cardiology during her admission. She has been started on po Amiodarone for rate control. She is not a candidate for anticoagulation due to brain mets. Other problems include HTN, HLD, tobacco abuse. By report she has had poor PO intake and has had progressive weakness/inanitation who presented to the cancer center for fluid infusioin. She was noted to have mild hypotension and tachycardia. EKG revealed A. Fib with RVR. She was directly referred to ED for tx and further evaluation.  She was started on IV amiodarone.  Patient referred for observation due to A Fib with RVR and hyperkalemia.    ED Course: T 97.4  101/79  HR 142  RR 15. Abnormal Lab: K 5.2, BUN 40, Cr 1.08, Ca 8.4, WBC 22.6 no diff done, Hgb 10.3, Plts  442.EKG @ 11:21 - A. Fib with RVR, @ 12.28 Sinus tach. In ED patient received 1L bolus NS, bolus IV amiodarone and continuous infusion. TRH called to admit for continued treatment.     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  -Currently in A-fib with RVR, and hypotensive Exacerbated by dehydration - In ED received IV bolus amiodarone and infusion with conversion to Sinus Tach.  -IV amiodarone has been switched to to 200 mg PO bid - Start low dose diltiazem 30 mg q 6, hold for SBP < 90 -Cardiology consulted .Marland Kitchen  Appreciate input -Pending 2D echocardiogram  Decubitus ulcer of sacral region, stage 1- (present on admission) RN noticed dark skin change left sacrum when moving the patient. Per patient and family she is in bed a lot and they have noticed the change. On exam : very dark skin 3x7 cm left sacrum, appears superficial  - Wound care consult-recommend surgical evaluation for debridement  Were not sure if patient could tolerate debridement at this time Nevertheless general surgery consulted -appreciate input  Metastatic primary lung cancer ([place])- (present on admission) Bx defined adenocarcinoma lung with bone, skin and brain mets.  Plan Continue treatment per oncology  Malignant neoplasm of upper-inner quadrant of left female breast ([place])- (present on admission) Continue home regimen -Planned for next week for appointment with Tarri Abernethy PAC in the office, -Palliative care consulted  Hypertension- (present on admission) Historically with HTN -currently hypotensive -Cardizem on hold due to hypotension -Continuing amiodarone -Appreciate cardiology input    Hyperlipidemia- (present on admission) Lipitor on patients med list but she claims this was stopped.  -Continue statin therapy while in-patient      Skin Assessment: I have examined the patient's skin and I agree with the wound assessment as performed by wound care team As outlined belowe: Pressure Injury 01/19/22  Buttocks Right Unstageable - Full thickness tissue loss in which the base of the injury is covered by slough (yellow, tan, gray, green or brown) and/or eschar (tan, brown or black) in the wound bed. decub ulcer (Active)  01/19/22 0051  Location: Buttocks  Location Orientation: Right  Staging: Unstageable - Full thickness tissue loss in which the base of the injury is covered by slough (yellow, tan, gray, green or brown) and/or eschar (tan, brown or black) in the wound bed.  Wound Description (Comments): decub ulcer  Present on Admission: Yes     Pressure Injury 01/19/22 Sacrum Mid Stage 1 -  Intact skin with non-blanchable redness of a localized area usually over a bony prominence. (Active)  01/19/22 0051  Location: Sacrum  Location Orientation: Mid  Staging: Stage 1 -  Intact skin with non-blanchable redness of a localized area usually over a bony prominence.  Wound Description (Comments):   Present on Admission: Yes  -General surgery consult for further evaluation and recommendation Likely would need debridement  -----------------------------------------------------------------------------------------------------  DVT prophylaxis:  Place TED hose Start: 01/18/22 1620   Code Status:   Code Status: DNR Palliative care consulted, continue discussion regarding goals of care possibly pursuing hospice in future if no improvement   Family Communication: Discussed with sister at bedside who is acting healthcare power of attorney  The above findings and plan of care has been discussed with patient (sister)  in detail,  they expressed understanding and agreement of above. -Advance care planning has been discussed... Confirmed DNR/DNI status  Admission status:   Status is: Observation The patient remains OBS appropriate and will d/c before 2 midnights.   Procedures:   No admission procedures for hospital encounter.   Antimicrobials:  Anti-infectives (From admission, onward)     None        Medication:   anastrozole  1 mg Oral Daily   atorvastatin  10 mg Oral QHS   Chlorhexidine Gluconate Cloth  6 each Topical Daily   docusate sodium  100 mg Oral BID   feeding supplement  237 mL Oral BID BM   Medihoney Wound/Burn Dressing  1 application Apply externally Daily   mouth rinse  15 mL Mouth Rinse BID   megestrol  400 mg Oral BID   oxyCODONE  10 mg Oral Q6H    acetaminophen, bisacodyl, HYDROmorphone (DILAUDID) injection, ibuprofen, ondansetron (ZOFRAN) IV, prochlorperazine   Objective:   Vitals:   01/19/22 0734 01/19/22 0800 01/19/22 0900 01/19/22 1100  BP:  107/63 (!) 103/50 101/76  Pulse: (!) 140 (!) 140 (!) 102 (!) 135  Resp: 19 17 15 13   Temp: (!) 97.5 F (36.4 C)     TempSrc: Oral     SpO2: 95% 92% 95% 95%  Weight:      Height:  Intake/Output Summary (Last 24 hours) at 01/19/2022 1214 Last data filed at 01/19/2022 5631 Gross per 24 hour  Intake 2583 ml  Output --  Net 2583 ml   Filed Weights   01/18/22 1124 01/19/22 0500  Weight: 51.7 kg 57 kg     Examination:   Physical Exam  Constitution:  Alert, cooperative, no distress,  Appears calm and comfortable  Psychiatric:   Normal and stable mood and affect, cognition intact,   HEENT:        Normocephalic, PERRL, otherwise with in Normal limits  Chest:         Chest symmetric Cardio vascular:  S1/S2, RRR, No murmure, No Rubs or Gallops  pulmonary: Clear to auscultation bilaterally, respirations unlabored, negative wheezes / crackles Abdomen: Soft, non-tender, non-distended, bowel sounds,no masses, no organomegaly Muscular skeletal: Limited exam - in bed, able to move all 4 extremities,   Neuro: CNII-XII intact. , normal motor and sensation, reflexes intact  Extremities: No pitting edema lower extremities, +2 pulses  Skin: Dry, warm to touch, negative for any Rashes, dark skin change left sacrum Wounds: per nursing documentation- dark skin change left  sacrum   ------------------------------------------------------------------------------------------------------------------------------------------    LABs:  CBC Latest Ref Rng & Units 01/19/2022 01/18/2022 01/18/2022  WBC 4.0 - 10.5 K/uL 26.0(H) 22.6(H) 26.5(H)  Hemoglobin 12.0 - 15.0 g/dL 9.0(L) 10.3(L) 11.1(L)  Hematocrit 36.0 - 46.0 % 28.0(L) 33.0(L) 34.4(L)  Platelets 150 - 400 K/uL 388 442(H) 496(H)   CMP Latest Ref Rng & Units 01/19/2022 01/18/2022 01/18/2022  Glucose 70 - 99 mg/dL 126(H) 99 128(H)  BUN 8 - 23 mg/dL 39(H) 40(H) 40(H)  Creatinine 0.44 - 1.00 mg/dL 0.81 1.08(H) 1.09(H)  Sodium 135 - 145 mmol/L 134(L) 135 135  Potassium 3.5 - 5.1 mmol/L 4.6 5.2(H) 5.2(H)  Chloride 98 - 111 mmol/L 104 103 101  CO2 22 - 32 mmol/L 20(L) 21(L) 22  Calcium 8.9 - 10.3 mg/dL 7.9(L) 8.4(L) 8.8(L)  Total Protein 6.5 - 8.1 g/dL - - 6.1(L)  Total Bilirubin 0.3 - 1.2 mg/dL - - 1.2  Alkaline Phos 38 - 126 U/L - - 137(H)  AST 15 - 41 U/L - - 17  ALT 0 - 44 U/L - - 13       Micro Results Recent Results (from the past 240 hour(s))  Blood Culture (routine x 2)     Status: None (Preliminary result)   Collection Time: 01/18/22  3:21 PM   Specimen: BLOOD RIGHT ARM  Result Value Ref Range Status   Specimen Description BLOOD RIGHT ARM  Final   Special Requests   Final    BOTTLES DRAWN AEROBIC AND ANAEROBIC Blood Culture adequate volume   Culture   Final    NO GROWTH < 24 HOURS Performed at [facility], [address], [place], [place] [address]    Report Status PENDING  Incomplete  Blood Culture (routine x 2)     Status: None (Preliminary result)   Collection Time: 01/18/22  3:21 PM   Specimen: BLOOD RIGHT ARM  Result Value Ref Range Status   Specimen Description BLOOD RIGHT ARM  Final   Special Requests   Final    BOTTLES DRAWN AEROBIC AND ANAEROBIC Blood Culture adequate volume   Culture   Final    NO GROWTH < 24 HOURS Performed at [facility], [address] [place], [place]  [address]    Report Status PENDING  Incomplete  MRSA Next Gen by PCR, Nasal     Status:  None   Collection Time: 01/18/22 10:45 PM   Specimen: Nasal Mucosa; Nasal Swab  Result Value Ref Range Status   MRSA by PCR Next Gen NOT DETECTED NOT DETECTED Final    Comment: (NOTE) The GeneXpert MRSA Assay (FDA approved for NASAL specimens only), is one component of a comprehensive MRSA colonization surveillance program. It is not intended to diagnose MRSA infection nor to guide or monitor treatment for MRSA infections. Test performance is not FDA approved in patients less than 99 years old. Performed at [facility], [address], [place], [place] [address]   Resp Panel by RT-PCR (Flu A&B, Covid) Nasopharyngeal Swab     Status: None   Collection Time: 01/18/22 11:06 PM   Specimen: Nasopharyngeal Swab; Nasopharyngeal(NP) swabs in vial transport medium  Result Value Ref Range Status   SARS Coronavirus 2 by RT PCR NEGATIVE NEGATIVE Final    Comment: (NOTE) SARS-CoV-2 target nucleic acids are NOT DETECTED.  The SARS-CoV-2 RNA is generally detectable in upper respiratory specimens during the acute phase of infection. The lowest concentration of SARS-CoV-2 viral copies this assay can detect is 138 copies/mL. A negative result does not preclude SARS-Cov-2 infection and should not be used as the sole basis for treatment or other patient management decisions. A negative result may occur with  improper specimen collection/handling, submission of specimen other than nasopharyngeal swab, presence of viral mutation(s) within the areas targeted by this assay, and inadequate number of viral copies(<138 copies/mL). A negative result must be combined with clinical observations, patient history, and epidemiological information. The expected result is Negative.  Fact Sheet for Patients:  EntrepreneurPulse.com.au  Fact Sheet for Healthcare Providers:   IncredibleEmployment.be  This test is no t yet approved or cleared by the [place] FDA and  has been authorized for detection and/or diagnosis of SARS-CoV-2 by FDA under an Emergency Use Authorization (EUA). This EUA will remain  in effect (meaning this test can be used) for the duration of the COVID-19 declaration under Section 564(b)(1) of the Act, 21 U.S.C.section 360bbb-3(b)(1), unless the authorization is terminated  or revoked sooner.       Influenza A by PCR NEGATIVE NEGATIVE Final   Influenza B by PCR NEGATIVE NEGATIVE Final    Comment: (NOTE) The Xpert Xpress SARS-CoV-2/FLU/RSV plus assay is intended as an aid in the diagnosis of influenza from Nasopharyngeal swab specimens and should not be used as a sole basis for treatment. Nasal washings and aspirates are unacceptable for Xpert Xpress SARS-CoV-2/FLU/RSV testing.  Fact Sheet for Patients: EntrepreneurPulse.com.au  Fact Sheet for Healthcare Providers: IncredibleEmployment.be  This test is not yet approved or cleared by the [place] FDA and has been authorized for detection and/or diagnosis of SARS-CoV-2 by FDA under an Emergency Use Authorization (EUA). This EUA will remain in effect (meaning this test can be used) for the duration of the COVID-19 declaration under Section 564(b)(1) of the Act, 21 U.S.C. section 360bbb-3(b)(1), unless the authorization is terminated or revoked.  Performed at [facility], [address], [place], [place] [address]     Radiology Reports No results found.  SIGNED: Deatra James, MD, FHM. [facility],  Pager (please use [email or web address] to page/text) Please use Epic Secure Chat for non-urgent communication (7AM-7PM)  If 7PM-7AM, please contact night-coverage [email or web address], 01/19/2022, 12:14 PM

## 2022-01-19 NOTE — Consult Note (Signed)
[facility] Consult  Reason for Consult: Decubitus ulcer Referring Physician: Dr. Flossie Dibble  Chief Complaint   Irregular Heart Beat     HPI: Elizabeth Wu is a 71 y.o. female who was admitted with atrial fibrillation with RVR.  She is currently in the ICU for management of her A-fib and is on IV amiodarone.  She is currently being treated for metastatic lung cancer that has metastasized to her left breast.  General surgery was consulted to evaluate for a right buttock wound.  Area was evaluated by wound care, who called the wound and unstageable 9 x 7 cm eschar.  Patient is unaware of how long the wound is there, and states that she does have some pain in her right buttock at times.  She denies ever having a pressure wound previously.  She has not been eating much, she does not have much of a diet.  She has no complaints of abdominal pain at this time.  She is not currently on any blood thinning medications for atrial fibrillation secondary to brain metastases.  Past Medical History:  Diagnosis Date   Allergy    Cancer (HCC)    Breast   Complication of anesthesia    DJD (degenerative joint disease)    Hyperlipidemia    Hypertension    Osteopenia    PONV (postoperative nausea and vomiting)     Past Surgical History:  Procedure Laterality Date   APPENDECTOMY     BRONCHIAL BIOPSY  11/11/2021   Procedure: BRONCHIAL BIOPSIES;  Surgeon: Leslye Peer, MD;  Location: [facility] ENDOSCOPY;  Service: Pulmonary;;   BRONCHIAL BRUSHINGS  11/11/2021   Procedure: BRONCHIAL BRUSHINGS;  Surgeon: Leslye Peer, MD;  Location: [facility] ENDOSCOPY;  Service: Pulmonary;;   BRONCHIAL NEEDLE ASPIRATION BIOPSY  11/11/2021   Procedure: BRONCHIAL NEEDLE ASPIRATION BIOPSIES;  Surgeon: Leslye Peer, MD;  Location: [facility] ENDOSCOPY;  Service: Pulmonary;;   FIDUCIAL MARKER PLACEMENT  11/11/2021   Procedure: FIDUCIAL MARKER PLACEMENT;  Surgeon: Leslye Peer, MD;  Location: [facility] ENDOSCOPY;  Service:  Pulmonary;;   HERNIA REPAIR     PORTACATH PLACEMENT Left 12/10/2021   Procedure: INSERTION PORT-A-CATH;  Surgeon: Lucretia Roers, MD;  Location: [facility] ORS;  Service: General;  Laterality: Left;   TONSILLECTOMY     TONSILLECTOMY      Family History  Problem Relation Age of Onset   Alzheimer's disease Mother    Cancer Father        stomach cancer   Diabetes Sister    Heart disease Brother        MI    Social History   Tobacco Use   Smoking status: Every Day    Packs/day: 0.30    Years: 55.00    Pack years: 16.50    Types: Cigarettes   Smokeless tobacco: Never  Vaping Use   Vaping Use: Never used  Substance Use Topics   Alcohol use: No   Drug use: No    Medications: I have reviewed the patient's current medications.  No Known Allergies   ROS:  Constitutional: negative for chills, fatigue, and fevers Respiratory: negative for cough and shortness of breath Cardiovascular: positive for palpitations, negative for chest pain Gastrointestinal: negative for abdominal pain, nausea, and vomiting Integument/breast: positive for wound on right buttock  Blood pressure (!) 91/55, pulse (!) 106, temperature 98.4 F (36.9 C), temperature source Oral, resp. rate 13, height 5\' 4"  (1.626 m), weight 57 kg, SpO2 94 %. Physical Exam Vitals reviewed.  Constitutional:      Appearance: Normal appearance.  HENT:     Head: Normocephalic and atraumatic.  Eyes:     Extraocular Movements: Extraocular movements intact.     Pupils: Pupils are equal, round, and reactive to light.  Cardiovascular:     Rate and Rhythm: Tachycardia present. Rhythm irregular.  Pulmonary:     Effort: Pulmonary effort is normal.  Abdominal:     General: There is no distension.     Palpations: Abdomen is soft.     Tenderness: There is no abdominal tenderness.  Musculoskeletal:     Cervical back: Normal range of motion.  Skin:    General: Skin is warm and dry.     Comments: Right buttock with 9 x 7 cm  unstageable wound, overlying eschar, no active drainage  Neurological:     General: No focal deficit present.     Mental Status: She is alert.     Comments: Confused  Psychiatric:        Mood and Affect: Mood normal.        Behavior: Behavior normal.    Results: Results for orders placed or performed during the hospital encounter of 01/18/22 (from the past 48 hour(s))  Basic metabolic panel     Status: Abnormal   Collection Time: 01/18/22 12:18 PM  Result Value Ref Range   Sodium 135 135 - 145 mmol/L   Potassium 5.2 (H) 3.5 - 5.1 mmol/L   Chloride 103 98 - 111 mmol/L   CO2 21 (L) 22 - 32 mmol/L   Glucose, Bld 99 70 - 99 mg/dL    Comment: Glucose reference range applies only to samples taken after fasting for at least 8 hours.   BUN 40 (H) 8 - 23 mg/dL   Creatinine, Ser 9.81 (H) 0.44 - 1.00 mg/dL   Calcium 8.4 (L) 8.9 - 10.3 mg/dL   GFR, Estimated 55 (L) >60 mL/min    Comment: (NOTE) Calculated using the CKD-EPI Creatinine Equation (2021)    Anion gap 11 5 - 15    Comment: Performed at [facility], [address] [place], [place] [address]  CBC     Status: Abnormal   Collection Time: 01/18/22 12:18 PM  Result Value Ref Range   WBC 22.6 (H) 4.0 - 10.5 K/uL   RBC 3.49 (L) 3.87 - 5.11 MIL/uL   Hemoglobin 10.3 (L) 12.0 - 15.0 g/dL   HCT 82.9 (L) 56.2 - 13.0 %   MCV 94.6 80.0 - 100.0 fL   MCH 29.5 26.0 - 34.0 pg   MCHC 31.2 30.0 - 36.0 g/dL   RDW 86.5 78.4 - 69.6 %   Platelets 442 (H) 150 - 400 K/uL   nRBC 0.0 0.0 - 0.2 %    Comment: Performed at [facility], [address] [place], [place] [address]  Magnesium     Status: None   Collection Time: 01/18/22 12:18 PM  Result Value Ref Range   Magnesium 1.8 1.7 - 2.4 mg/dL    Comment: Performed at [facility], [address], [place], [place] [address]  Lactic acid, plasma     Status: None   Collection Time: 01/18/22  3:21 PM  Result Value Ref Range   Lactic Acid, Venous 1.8 0.5 - 1.9 mmol/L    Comment: Performed at [facility], [address] [place], [place] [address]  Protime-INR     Status: Abnormal   Collection Time: 01/18/22  3:21 PM  Result Value Ref Range  Prothrombin Time 15.3 (H) 11.4 - 15.2 seconds   INR 1.2 0.8 - 1.2    Comment: (NOTE) INR goal varies based on device and disease states. Performed at [facility] [address], [place], [place] [address]   APTT     Status: None   Collection Time: 01/18/22  3:21 PM  Result Value Ref Range   aPTT 31 24 - 36 seconds    Comment: Performed at [facility], [address] [place], [place] [address]  Blood Culture (routine x 2)     Status: None (Preliminary result)   Collection Time: 01/18/22  3:21 PM   Specimen: BLOOD RIGHT ARM  Result Value Ref Range   Specimen Description BLOOD RIGHT ARM    Special Requests      BOTTLES DRAWN AEROBIC AND ANAEROBIC Blood Culture adequate volume   Culture      NO GROWTH < 24 HOURS Performed at [facility], [address], [place], [place] [address]    Report Status PENDING   Blood Culture (routine x 2)     Status: None (Preliminary result)   Collection Time: 01/18/22  3:21 PM   Specimen: BLOOD RIGHT ARM  Result Value Ref Range   Specimen Description BLOOD RIGHT ARM    Special Requests      BOTTLES DRAWN AEROBIC AND ANAEROBIC Blood Culture adequate volume   Culture      NO GROWTH < 24 HOURS Performed at [facility], [address], [place], [place] [address]    Report Status PENDING   MRSA Next Gen by PCR, Nasal     Status: None   Collection Time: 01/18/22 10:45 PM   Specimen: Nasal Mucosa; Nasal Swab  Result Value Ref Range   MRSA by PCR Next Gen NOT DETECTED NOT DETECTED    Comment: (NOTE) The GeneXpert MRSA Assay (FDA approved for NASAL specimens only), is one component of a comprehensive MRSA colonization surveillance program. It is not intended to diagnose MRSA infection nor to guide or monitor treatment for MRSA infections. Test performance is not FDA approved in patients less than 54  years old. Performed at [facility] [address] [place], [place] [address]   Resp Panel by RT-PCR (Flu A&B, Covid) Nasopharyngeal Swab     Status: None   Collection Time: 01/18/22 11:06 PM   Specimen: Nasopharyngeal Swab; Nasopharyngeal(NP) swabs in vial transport medium  Result Value Ref Range   SARS Coronavirus 2 by RT PCR NEGATIVE NEGATIVE    Comment: (NOTE) SARS-CoV-2 target nucleic acids are NOT DETECTED.  The SARS-CoV-2 RNA is generally detectable in upper respiratory specimens during the acute phase of infection. The lowest concentration of SARS-CoV-2 viral copies this assay can detect is 138 copies/mL. A negative result does not preclude SARS-Cov-2 infection and should not be used as the sole basis for treatment or other patient management decisions. A negative result may occur with  improper specimen collection/handling, submission of specimen other than nasopharyngeal swab, presence of viral mutation(s) within the areas targeted by this assay, and inadequate number of viral copies(<138 copies/mL). A negative result must be combined with clinical observations, patient history, and epidemiological information. The expected result is Negative.  Fact Sheet for Patients:  BloggerCourse.com  Fact Sheet for Healthcare Providers:  SeriousBroker.it  This test is no t yet approved or cleared by the [place] FDA and  has been authorized for detection and/or diagnosis of SARS-CoV-2 by FDA under an Emergency Use Authorization (EUA). This EUA will remain  in effect (meaning this test can be used) for the duration of the COVID-19 declaration under Section 564(b)(1) of the Act, 21 U.S.C.section 360bbb-3(b)(1), unless the authorization is terminated  or revoked sooner.       Influenza A by PCR NEGATIVE NEGATIVE   Influenza B by PCR NEGATIVE NEGATIVE    Comment: (NOTE) The Xpert Xpress SARS-CoV-2/FLU/RSV plus assay is  intended as an aid in the diagnosis of influenza from Nasopharyngeal swab specimens and should not be used as a sole basis for treatment. Nasal washings and aspirates are unacceptable for Xpert Xpress SARS-CoV-2/FLU/RSV testing.  Fact Sheet for Patients: BloggerCourse.com  Fact Sheet for Healthcare Providers: SeriousBroker.it  This test is not yet approved or cleared by the [place] FDA and has been authorized for detection and/or diagnosis of SARS-CoV-2 by FDA under an Emergency Use Authorization (EUA). This EUA will remain in effect (meaning this test can be used) for the duration of the COVID-19 declaration under Section 564(b)(1) of the Act, 21 U.S.C. section 360bbb-3(b)(1), unless the authorization is terminated or revoked.  Performed at [facility], [address] [place], [place] [address]   Basic metabolic panel     Status: Abnormal   Collection Time: 01/19/22  3:55 AM  Result Value Ref Range   Sodium 134 (L) 135 - 145 mmol/L   Potassium 4.6 3.5 - 5.1 mmol/L   Chloride 104 98 - 111 mmol/L   CO2 20 (L) 22 - 32 mmol/L   Glucose, Bld 126 (H) 70 - 99 mg/dL    Comment: Glucose reference range applies only to samples taken after fasting for at least 8 hours.   BUN 39 (H) 8 - 23 mg/dL   Creatinine, Ser 9.14 0.44 - 1.00 mg/dL   Calcium 7.9 (L) 8.9 - 10.3 mg/dL   GFR, Estimated >78 >29 mL/min    Comment: (NOTE) Calculated using the CKD-EPI Creatinine Equation (2021)    Anion gap 10 5 - 15    Comment: Performed at [facility], [address], [place], [place] [address]  TSH     Status: None   Collection Time: 01/19/22  3:55 AM  Result Value Ref Range   TSH 1.037 0.350 - 4.500 uIU/mL    Comment: Performed by a 3rd Generation assay with a functional sensitivity of <=0.01 uIU/mL. Performed at [facility], [address], [place], [place] [address]   CBC with Differential/Platelet     Status: Abnormal   Collection Time:  01/19/22  3:55 AM  Result Value Ref Range   WBC 26.0 (H) 4.0 - 10.5 K/uL   RBC 2.90 (L) 3.87 - 5.11 MIL/uL   Hemoglobin 9.0 (L) 12.0 - 15.0 g/dL   HCT 84.6 (L) 96.2 - 95.2 %   MCV 96.6 80.0 - 100.0 fL   MCH 31.0 26.0 - 34.0 pg   MCHC 32.1 30.0 - 36.0 g/dL   RDW 84.1 32.4 - 40.1 %   Platelets 388 150 - 400 K/uL   nRBC 0.0 0.0 - 0.2 %   Neutrophils Relative % 71 %   Neutro Abs 21.1 (H) 1.7 - 7.7 K/uL   Band Neutrophils 10 %   Lymphocytes Relative 1 %   Lymphs Abs 0.3 (L) 0.7 - 4.0 K/uL   Monocytes Relative 0 %   Monocytes Absolute 0.0 (L) 0.1 - 1.0 K/uL   Eosinophils Relative 0 %   Eosinophils Absolute 0.0 0.0 - 0.5 K/uL   Basophils Relative 0 %   Basophils Absolute 0.0 0.0 -  0.1 K/uL   WBC Morphology MILD LEFT SHIFT (1-5% METAS, OCC MYELO, OCC BANDS)     Comment: VACUOLATED NEUTROPHILS   RBC Morphology MORPHOLOGY UNREMARKABLE    Smear Review MORPHOLOGY UNREMARKABLE    Metamyelocytes Relative 17 %   Myelocytes 1 %    Comment: Performed at [facility], [address], [place], [place] [address]  Procalcitonin - Baseline     Status: None   Collection Time: 01/19/22  3:55 AM  Result Value Ref Range   Procalcitonin 72.34 ng/mL    Comment:        Interpretation: PCT >= 10 ng/mL: Important systemic inflammatory response, almost exclusively due to severe bacterial sepsis or septic shock. (NOTE)       Sepsis PCT Algorithm           Lower Respiratory Tract                                      Infection PCT Algorithm    ----------------------------     ----------------------------         PCT < 0.25 ng/mL                PCT < 0.10 ng/mL          Strongly encourage             Strongly discourage   discontinuation of antibiotics    initiation of antibiotics    ----------------------------     -----------------------------       PCT 0.25 - 0.50 ng/mL            PCT 0.10 - 0.25 ng/mL               OR       >80% decrease in PCT            Discourage initiation of                                             antibiotics      Encourage discontinuation           of antibiotics    ----------------------------     -----------------------------         PCT >= 0.50 ng/mL              PCT 0.26 - 0.50 ng/mL                AND       <80% decrease in PCT             Encourage initiation of                                             antibiotics       Encourage continuation           of antibiotics    ----------------------------     -----------------------------        PCT >= 0.50 ng/mL                  PCT > 0.50 ng/mL               AND  increase in PCT                  Strongly encourage                                      initiation of antibiotics    Strongly encourage escalation           of antibiotics                                     -----------------------------                                           PCT <= 0.25 ng/mL                                                 OR                                        > 80% decrease in PCT                                      Discontinue / Do not initiate                                             antibiotics  Performed at [facility], [address], [place], [place] [address]   Lactic acid, plasma     Status: None   Collection Time: 01/19/22  7:45 AM  Result Value Ref Range   Lactic Acid, Venous 1.4 0.5 - 1.9 mmol/L    Comment: Performed at [facility], [address], [place], [place] [address]    DG Chest Port 1 View  Result Date: 01/18/2022 CLINICAL DATA:  Atrial fibrillation EXAM: PORTABLE CHEST 1 VIEW COMPARISON:  12/31/2021 FINDINGS: Diffuse reticulonodular interstitial disease likely reflecting a combination of metastatic disease and underlying emphysematous changes. No new focal consolidation. No pleural effusion or pneumothorax. Heart and mediastinal contours are unremarkable. No acute osseous abnormality. Right-sided Port-A-Cath in satisfactory position. IMPRESSION: 1. Diffuse reticulonodular  interstitial disease likely reflecting a combination of metastatic disease and underlying emphysematous changes. 2. No new focal parenchymal opacity suggests pneumonia. Electronically Signed   By: Elige Ko M.D.   On: 01/18/2022 11:54     Assessment & Plan:  Elizabeth Wu is a 71 y.o. female with a PMH significant for metastatic lung cancer who was admitted to the hospital for A-fib with RVR.  General surgery consulted for right buttock pressure wound.  -The risk and benefits of bedside right buttock wound debridement were discussed including but not limited to pain, bleeding, infection, injury to surrounding structures.  After careful consideration, ANIBAL ZINGALE and her sister have decided to proceed with this bedside procedure.  Verbal consent was obtained -  S/p bedside debridement of right buttock pressure wound, final measurements 9 x 7 x 4 cm -Eschar was able to be removed in addition to necrotic fat, however wound was quite extensive and deep, that all necrotic tissue was not able to be removed down to healthy viable tissue -Given the patient's poor prognosis, would recommend against further debridements, as these would require OR, and patient would likely end up needing diverting ostomy -The extent of the wound was explained to the patient and her sister, who are in agreement that they would not want further debridement, as they do not want her to undergo an operative procedure -BID wet-to-dry dressing changes with Santyl -Care per primary team  All questions were answered to the satisfaction of the patient and family.   -- Theophilus Kinds, DO [facility] [address] [place], [place] [address] [phone] (office)

## 2022-01-20 DIAGNOSIS — Z7189 Other specified counseling: Secondary | ICD-10-CM

## 2022-01-20 DIAGNOSIS — C3490 Malignant neoplasm of unspecified part of unspecified bronchus or lung: Secondary | ICD-10-CM | POA: Diagnosis not present

## 2022-01-20 DIAGNOSIS — L89151 Pressure ulcer of sacral region, stage 1: Secondary | ICD-10-CM | POA: Diagnosis not present

## 2022-01-20 DIAGNOSIS — C7931 Secondary malignant neoplasm of brain: Secondary | ICD-10-CM | POA: Diagnosis present

## 2022-01-20 DIAGNOSIS — C50212 Malignant neoplasm of upper-inner quadrant of left female breast: Secondary | ICD-10-CM

## 2022-01-20 DIAGNOSIS — Z515 Encounter for palliative care: Secondary | ICD-10-CM | POA: Diagnosis not present

## 2022-01-20 DIAGNOSIS — I4891 Unspecified atrial fibrillation: Secondary | ICD-10-CM | POA: Diagnosis not present

## 2022-01-20 DIAGNOSIS — E875 Hyperkalemia: Secondary | ICD-10-CM | POA: Diagnosis present

## 2022-01-20 DIAGNOSIS — Z79811 Long term (current) use of aromatase inhibitors: Secondary | ICD-10-CM | POA: Diagnosis not present

## 2022-01-20 DIAGNOSIS — Z66 Do not resuscitate: Secondary | ICD-10-CM | POA: Diagnosis not present

## 2022-01-20 DIAGNOSIS — R000 Tachycardia, unspecified: Secondary | ICD-10-CM | POA: Diagnosis not present

## 2022-01-20 DIAGNOSIS — R072 Precordial pain: Secondary | ICD-10-CM | POA: Diagnosis not present

## 2022-01-20 DIAGNOSIS — Z79818 Long term (current) use of other agents affecting estrogen receptors and estrogen levels: Secondary | ICD-10-CM | POA: Diagnosis not present

## 2022-01-20 DIAGNOSIS — C3491 Malignant neoplasm of unspecified part of right bronchus or lung: Secondary | ICD-10-CM | POA: Diagnosis not present

## 2022-01-20 DIAGNOSIS — Z7401 Bed confinement status: Secondary | ICD-10-CM | POA: Diagnosis not present

## 2022-01-20 DIAGNOSIS — C7951 Secondary malignant neoplasm of bone: Secondary | ICD-10-CM | POA: Diagnosis present

## 2022-01-20 DIAGNOSIS — Z79899 Other long term (current) drug therapy: Secondary | ICD-10-CM | POA: Diagnosis not present

## 2022-01-20 DIAGNOSIS — R531 Weakness: Secondary | ICD-10-CM | POA: Diagnosis not present

## 2022-01-20 DIAGNOSIS — I4892 Unspecified atrial flutter: Secondary | ICD-10-CM | POA: Diagnosis present

## 2022-01-20 DIAGNOSIS — L89314 Pressure ulcer of right buttock, stage 4: Secondary | ICD-10-CM | POA: Diagnosis not present

## 2022-01-20 DIAGNOSIS — Z853 Personal history of malignant neoplasm of breast: Secondary | ICD-10-CM | POA: Diagnosis not present

## 2022-01-20 DIAGNOSIS — M1990 Unspecified osteoarthritis, unspecified site: Secondary | ICD-10-CM | POA: Diagnosis present

## 2022-01-20 DIAGNOSIS — Z8249 Family history of ischemic heart disease and other diseases of the circulatory system: Secondary | ICD-10-CM | POA: Diagnosis not present

## 2022-01-20 DIAGNOSIS — E860 Dehydration: Secondary | ICD-10-CM | POA: Diagnosis present

## 2022-01-20 DIAGNOSIS — E785 Hyperlipidemia, unspecified: Secondary | ICD-10-CM | POA: Diagnosis present

## 2022-01-20 DIAGNOSIS — Z20822 Contact with and (suspected) exposure to covid-19: Secondary | ICD-10-CM | POA: Diagnosis not present

## 2022-01-20 DIAGNOSIS — I10 Essential (primary) hypertension: Secondary | ICD-10-CM | POA: Diagnosis present

## 2022-01-20 DIAGNOSIS — I480 Paroxysmal atrial fibrillation: Secondary | ICD-10-CM | POA: Diagnosis not present

## 2022-01-20 DIAGNOSIS — F17210 Nicotine dependence, cigarettes, uncomplicated: Secondary | ICD-10-CM | POA: Diagnosis present

## 2022-01-20 DIAGNOSIS — N179 Acute kidney failure, unspecified: Secondary | ICD-10-CM | POA: Diagnosis not present

## 2022-01-20 DIAGNOSIS — R54 Age-related physical debility: Secondary | ICD-10-CM | POA: Diagnosis present

## 2022-01-20 LAB — RESP PANEL BY RT-PCR (FLU A&B, COVID) ARPGX2
Influenza A by PCR: NEGATIVE
Influenza B by PCR: NEGATIVE
SARS Coronavirus 2 by RT PCR: NEGATIVE

## 2022-01-20 MED ORDER — ONDANSETRON 4 MG PO TBDP: 4.0000 mg | ORAL_TABLET | Freq: Four times a day (QID) | ORAL | Status: DC | PRN

## 2022-01-20 MED ORDER — HALOPERIDOL LACTATE 2 MG/ML PO CONC
0.5000 mg | ORAL | Status: DC | PRN
  Filled 2022-01-20: qty 0.3

## 2022-01-20 MED ORDER — GLYCOPYRROLATE 0.2 MG/ML IJ SOLN: 0.2000 mg | INTRAMUSCULAR | Status: DC | PRN

## 2022-01-20 MED ORDER — ONDANSETRON HCL 4 MG/2ML IJ SOLN: 4.0000 mg | Freq: Four times a day (QID) | INTRAMUSCULAR | Status: DC | PRN

## 2022-01-20 MED ORDER — POLYVINYL ALCOHOL 1.4 % OP SOLN: 1.0000 [drp] | Freq: Four times a day (QID) | OPHTHALMIC | Status: DC | PRN

## 2022-01-20 MED ORDER — GLYCOPYRROLATE 1 MG PO TABS: 1.0000 mg | ORAL_TABLET | ORAL | Status: DC | PRN

## 2022-01-20 MED ORDER — LORAZEPAM 2 MG/ML IJ SOLN: 0.5000 mg | INTRAMUSCULAR | Status: DC | PRN

## 2022-01-20 MED ORDER — PIPERACILLIN-TAZOBACTAM 3.375 G IVPB 30 MIN
3.3750 g | Freq: Once | INTRAVENOUS | Status: DC
  Filled 2022-01-20: qty 50

## 2022-01-20 MED ORDER — PIPERACILLIN-TAZOBACTAM 3.375 G IVPB
3.3750 g | Freq: Three times a day (TID) | INTRAVENOUS | Status: DC
Start: 2022-01-20 — End: 2022-01-20

## 2022-01-20 MED ORDER — MAGNESIUM SULFATE 2 GM/50ML IV SOLN
2.0000 g | Freq: Once | INTRAVENOUS | Status: DC
  Filled 2022-01-20: qty 50

## 2022-01-20 MED ORDER — BIOTENE DRY MOUTH MT LIQD: 15.0000 mL | OROMUCOSAL | Status: DC | PRN

## 2022-01-20 MED ORDER — MORPHINE SULFATE (PF) 2 MG/ML IV SOLN
2.0000 mg | INTRAVENOUS | Status: DC | PRN
  Administered 2022-01-20: 2 mg via INTRAVENOUS
  Filled 2022-01-20: qty 1

## 2022-01-20 MED ORDER — GLYCOPYRROLATE 1 MG PO TABS: 1.0000 mg | ORAL_TABLET | ORAL | 0 refills | Status: AC | PRN

## 2022-01-20 MED ORDER — COLLAGENASE 250 UNIT/GM EX OINT
TOPICAL_OINTMENT | Freq: Two times a day (BID) | CUTANEOUS | Status: DC
  Filled 2022-01-20: qty 30

## 2022-01-20 MED ORDER — MORPHINE SULFATE (PF) 2 MG/ML IV SOLN: 1.0000 mg | INTRAVENOUS | Status: DC | PRN

## 2022-01-20 MED ORDER — ACETAMINOPHEN 650 MG RE SUPP: 650.0000 mg | Freq: Four times a day (QID) | RECTAL | Status: DC | PRN

## 2022-01-20 MED ORDER — HALOPERIDOL 0.5 MG PO TABS: 0.5000 mg | ORAL_TABLET | ORAL | Status: DC | PRN

## 2022-01-20 MED ORDER — METOPROLOL TARTRATE 25 MG PO TABS: 25.0000 mg | ORAL_TABLET | Freq: Two times a day (BID) | ORAL | 0 refills | Status: AC

## 2022-01-20 MED ORDER — ACETAMINOPHEN 325 MG PO TABS: 650.0000 mg | ORAL_TABLET | Freq: Four times a day (QID) | ORAL | Status: DC | PRN

## 2022-01-20 MED ORDER — METOPROLOL TARTRATE 25 MG PO TABS
25.0000 mg | ORAL_TABLET | Freq: Two times a day (BID) | ORAL | Status: DC
  Administered 2022-01-20: 25 mg via ORAL
  Filled 2022-01-20: qty 1

## 2022-01-20 MED ORDER — HALOPERIDOL LACTATE 5 MG/ML IJ SOLN: 0.5000 mg | INTRAMUSCULAR | Status: DC | PRN

## 2022-01-20 NOTE — TOC Transition Note (Signed)
Transition of Care (TOC) - CM/SW Discharge Note ? ? ?Patient Details  ?Name: Elizabeth Wu ?MRN: [id number] ?Date of Birth: 29-Oct-1951 ? ?Transition of Care (TOC) CM/SW Contact:  ?Iona Beard, LCSWA ?Phone Number: ?01/20/2022, 3:55 PM ? ? ?Clinical Narrative:    ?CSW updated that pt and family wish for residential hospice placement at the [facility]. I spoke with [place] and Keka with [facility] who state that pt can arrive to facility for admission today. Pt will need COVID test resulted prior to coming. MD placed order and covid test swabbed. CSW confirmed with Marchia Meiers at hospice that pt can arrive later this evening. CSW updated RN of after hours number for report. TOC signing off.  ? ?Final next level of care: [place] ?Barriers to Discharge: Barriers Resolved ? ? ?Patient Goals and CMS Choice ?Patient states their goals for this hospitalization and ongoing recovery are:: Residential hospice ?  ?  ? ?Discharge Placement ?  ?           ?Patient chooses bed at: Other - please specify in the comment section below: (Residential hospice- [facility]) ?Patient to be transferred to facility by: EMS ?Name of family member notified: grandson ?Patient and family notified of of transfer: 01/20/22 ? ?Discharge Plan and Services ?  ?  ?           ?  ?  ?  ?  ?  ?  ?  ?  ?  ?  ? ?Social Determinants of Health (SDOH) Interventions ?  ? ? ?Readmission Risk Interventions ?No flowsheet data found. ? ? ? ? ?

## 2022-01-20 NOTE — Discharge Planning (Signed)
Oncology Discharge Planning Note ? ?[place] at [facility] ?Address: [address] [place], [place] [address] ?Hours of Operation:  8am - 5pm, Monday - Friday  ?Clinic Contact Information:  [phone] ? ?Oncology Care Team: ?Medical Oncologist:  Dr.Sreedhar Delton Coombes ? ?Patient Details: ?Name:  Elizabeth Wu, Elizabeth Wu ?MRN:   [id number] ?DOB:   11-01-1951 ?Reason for Current Admission: Atrial fibrillation with RVR ([place]) ? ?Discharge Planning Narrative: ?Discharge follow-up appointments will be cancelled, as she has elected [facility] and plans to be transferred there today.   ? ? ?

## 2022-01-20 NOTE — Discharge Planning (Signed)
Oncology Discharge Planning Note ? ?[place] at [facility] ?Address: [address] [place], [place] [address] ?Hours of Operation:  8am - 5pm, Monday - Friday  ?Clinic Contact Information:  [phone] ? ?Oncology Care Team: ?Medical Oncologist:  Derek Jack ? ?Patient Details: ?Name:  Elizabeth Wu, Elizabeth Wu ?MRN:   [id number] ?DOB:   12-20-1950 ?Reason for Current Admission: Atrial fibrillation with RVR ([place]) ? ?Discharge Planning Narrative: ? Upon discharge from the hospital, hematology/oncology's post discharge plan of care for the outpatient setting is: TBD if she is not admitted to [facility].  ? ?Alzena Gerber will be called within two business days after discharge to review hematology/oncology's plan of care for full understanding.   ? ?Outpatient Oncology Specific Care Only: ?Oncology appointment transportation needs addressed?:  not applicable ?Oncology medication management for symptom management addressed?:  not applicable ?Chemo Alert Card reviewed?:  not applicable ?Immunotherapy Alert Card reviewed?:  not applicable ? ?

## 2022-01-20 NOTE — Progress Notes (Addendum)
? ?Progress Note ? ?Elizabeth Wu Name: Elizabeth Wu ?Date of Encounter: 01/20/2022 ? ?[facility] Cardiologist: Candee Furbish, MD  ? ?Subjective  ? ?No chest pain or SOB complains of a dry mouth  ? ?Inpatient Medications  ?  ?Scheduled Meds: ? anastrozole  1 mg Oral Daily  ? atorvastatin  10 mg Oral QHS  ? Chlorhexidine Gluconate Cloth  6 each Topical Daily  ? collagenase   Topical BID  ? docusate sodium  100 mg Oral BID  ? feeding supplement  237 mL Oral BID BM  ? Medihoney Wound/Burn Dressing  1 application Apply externally Daily  ? mouth rinse  15 mL Mouth Rinse BID  ? megestrol  400 mg Oral BID  ? metoprolol tartrate  25 mg Oral BID  ? oxyCODONE  10 mg Oral Q6H  ? ?Continuous Infusions: ? sodium chloride 75 mL/hr at 01/20/22 0840  ? amiodarone 30 mg/hr (01/20/22 0840)  ? magnesium sulfate bolus IVPB    ? piperacillin-tazobactam    ? piperacillin-tazobactam (ZOSYN)  IV    ? ?PRN Meds: ?acetaminophen, bisacodyl, ibuprofen, morphine injection, ondansetron (ZOFRAN) IV, prochlorperazine  ? ?Vital Signs  ?  ?Vitals:  ? 01/20/22 0500 01/20/22 0700 01/20/22 0738 01/20/22 0800  ?BP: 123/90 (!) 141/75  94/63  ?Pulse: (!) 144 (!) 158 (!) 145 81  ?Resp: (!) 26 20 19 13   ?Temp:   98.3 ?F (36.8 ?C)   ?TempSrc:   Oral   ?SpO2: 93% 94% 93% 93%  ?Weight: 57.9 kg     ?Height:      ? ? ?Intake/Output Summary (Last 24 hours) at 01/20/2022 0912 ?Last data filed at 01/20/2022 0840 ?Gross per 24 hour  ?Intake 2086.73 ml  ?Output 200 ml  ?Net 1886.73 ml  ? ?Last 3 Weights 01/20/2022 01/19/2022 01/18/2022  ?Weight (lbs) 127 lb 10.3 oz 125 lb 10.6 oz 114 lb  ?Weight (kg) 57.9 kg 57 kg 51.71 kg  ?   ? ?Telemetry  ?  ?A flutter RVR  one episode at 0713 or so to SR with PACs very brief then back to SR.  - Personally Reviewed ? ?ECG  ?  ?No new - Personally Reviewed ? ?Physical Exam  ? ?GEN: No acute distress.   ?Neck: No JVD ?Cardiac: rapid and irreguilar, no murmurs, rubs, or gallops.  ?Respiratory: Clear to auscultation bilaterally. Ant position ?GI:  Soft, nontender, non-distended  ?MS: No edema; No deformity. ?Neuro:  Nonfocal  ?Psych: Normal affect  ? ?Labs  ?  ?High Sensitivity Troponin:   ?Recent Labs  ?Lab 12/31/21 ?1001 12/31/21 ?1138  ?TROPONINIHS 138* 124*  ?   ?Chemistry ?Recent Labs  ?Lab 01/18/22 ?0926 01/18/22 ?1218 01/19/22 ?0355  ?NA 135 135 134*  ?K 5.2* 5.2* 4.6  ?CL 101 103 104  ?CO2 22 21* 20*  ?GLUCOSE 128* 99 126*  ?BUN 40* 40* 39*  ?CREATININE 1.09* 1.08* 0.81  ?CALCIUM 8.8* 8.4* 7.9*  ?MG 1.7 1.8  --   ?PROT 6.1*  --   --   ?ALBUMIN 2.1*  --   --   ?AST 17  --   --   ?ALT 13  --   --   ?ALKPHOS 137*  --   --   ?BILITOT 1.2  --   --   ?GFRNONAA 55* 55* >60  ?ANIONGAP 12 11 10   ?  ?Lipids No results for input(s): CHOL, TRIG, HDL, LABVLDL, LDLCALC, CHOLHDL in the last 168 hours.  ?Hematology ?Recent Labs  ?Lab 01/18/22 ?[phone]  01/18/22 ?1218 01/19/22 ?0355  ?WBC 26.5* 22.6* 26.0*  ?RBC 3.67* 3.49* 2.90*  ?HGB 11.1* 10.3* 9.0*  ?HCT 34.4* 33.0* 28.0*  ?MCV 93.7 94.6 96.6  ?MCH 30.2 29.5 31.0  ?MCHC 32.3 31.2 32.1  ?RDW 14.2 14.3 14.7  ?PLT 496* 442* 388  ? ?Thyroid  ?Recent Labs  ?Lab 01/19/22 ?8546  ?TSH 1.037  ?  ?BNPNo results for input(s): BNP, PROBNP in the last 168 hours.  ?DDimer No results for input(s): DDIMER in the last 168 hours.  ? ?Radiology  ?  ?DG Chest Port 1 View ? ?Result Date: 01/18/2022 ?CLINICAL DATA:  Atrial fibrillation EXAM: PORTABLE CHEST 1 VIEW COMPARISON:  12/31/2021 FINDINGS: Diffuse reticulonodular interstitial disease likely reflecting a combination of metastatic disease and underlying emphysematous changes. No new focal consolidation. No pleural effusion or pneumothorax. Heart and mediastinal contours are unremarkable. No acute osseous abnormality. Right-sided Port-A-Cath in satisfactory position. IMPRESSION: 1. Diffuse reticulonodular interstitial disease likely reflecting a combination of metastatic disease and underlying emphysematous changes. 2. No new focal parenchymal opacity suggests pneumonia. Electronically  Signed   By: Kathreen Devoid M.D.   On: 01/18/2022 11:54   ? ?Cardiac Studies  ? ?none ? ?Elizabeth Wu Profile  ?   ?71 y.o. female  hx of paroxysmal aflutter, metatastic breast cancer cards seeing for the evaluation of tachycardia   ? ?Assessment & Plan  ?  ?1.Aflutter with RVR ?-recent issues with recurrent aflutter with RVR over the last few months ?- has not been on anticoag due to brain mets/breast cancer ?- admit 12/2021 received amio IV bolus then coverted to oral to complete load, does not appear had full 24 hr load IV at that time.  ?- presents with recurrent aflutter with RVR, started back on IV amio. Would plan for full 24+ hr load then conversion back to oral  may need to convert to po today if going to hospice.  ?- based on wbc and procalc perhaps aflutter exacerbated by infection, defer workup to primary team.  ?  ?  ?2.AKI ?- notes mention poor oral intake at home, likely prerenal ?  ?  ?3.Leukocytosis/elevated procalcitonin ?- per primary team ?  ?4. Metastatic breast cancer ?- followed by palliative this admission.  ?- plan for hospice post hospital ? ?   ? ?For questions or updates, please contact [facility] ?Please consult [email or web address] for contact info under  ? ?  ?   ?Signed, ?Cecilie Kicks, NP  ?01/20/2022, 9:12 AM   ? ?Personally seen and examined. Agree with APP above with the following comments: ?Elizabeth Wu notes that she has heart pain that has bothered her all night. ?She denies palpitations. ?Exam notable for abdominal pain on palpitation. Winces when she rolls into her decubitus ulcer ?Personally reviewed relevant tests; AFL rate 150s ?Would recommend  ?- discussed with Dr. Jamesetta Geralds who added metoprolol to her IV amiodarone and we discussed the if full scope of care we would add digoxin next (125 mcg) ?- she is asymptomatic of the heart rate ?- we discussed with PC NP, Elizabeth Wu and niece; presently she is transition to comfort and dignity focused end of life care ?- none of her cardiac medications  but be kept in this regard; will not add digoxin as above ? ?Cardiology will sign off with comfort care and hospice transition; no medication recommendations as symptom burden per hospice protocol is appropriate; does not need cardiology f/u or testing ? ?Rudean Haskell, MD ?Cardiologist ?[id number] ?[place], [place] [address] ?[phone]  ?10:33 AM ? ? ? ?

## 2022-01-20 NOTE — Progress Notes (Signed)
OT Cancellation Note ? ?Patient Details ?Name: RILEY PAPIN ?MRN: [id number] ?DOB: [birth year] ? ? ?Cancelled Treatment:    Reason Eval/Treat Not Completed: Medical issues which prohibited therapy. Nursing reported to hold on pt evaluation due to pt likely going into hospice care. Will remove pt from evaluation list if pt is placed on hospice care.  ? ?Moksh Loomer OT, MOT ? ? ?Larey Seat ?01/20/2022, 8:47 AM ?

## 2022-01-20 NOTE — Progress Notes (Addendum)
Copy of signed Advanced Directive for [facility], A Practical Form for All Adults (all seven pages) was given to Patient grandson, [name]. Patient aware. Yolanda Bonine looked over all forms and ok with everything with no complaints.  ?

## 2022-01-20 NOTE — Progress Notes (Addendum)
Palliative: ?Ms. [name], is lying quietly in bed.  She appears acutely/chronically ill and very frail.  She is alert and oriented, and can make her basic needs known.  Her niece/(her brother's daughter), Cecille Rubin, is at bedside.  Cardiology is also at bedside discussing symptom management for Salle's heart rate. ? ?Jelina has shared with her family and the medical team that she is ready for comfort and dignity, to let nature take its course at residential hospice, [place], and [place].  We talk about what is and is not provided in residential hospice, in particular around symptom management.  We talked about the benefits of pain medicines for trouble breathing and also medicines for anxiety. ? ?Sister/healthcare surrogate, Terrace Arabia is contacted via phone while we are at bedside.  [name] shares that she has been in touch with hospice and signed the paperwork for Wm to be admitted to residential hospice.  She shares that her understanding is they would have a bed available today. ? ?Conference with attending, cardiology, bedside nursing staff, transition of care team related to patient condition, needs, goals of care, disposition. ?DNR/goldenrod form completed and placed on chart. ?End-of-life order set implemented.  ? ?Plan:    Requesting comfort and dignity at end-of-life, residential hospice at [place]. ?Prognosis:    2 weeks or less anticipated based on acuity of condition, currently in a flutter, severe metastatic cancer burden, large sacral decubitus that is currently open and packed, extremely poor by mouth intake with 4 pound weight loss in 1 week, patient and family's desire to focus on comfort and dignity, let nature take its course. ? ?50 minutes  ?Quinn Axe, NP ?Palliative medicine team ?Team phone [phone] ?Greater than 50% of this time was spent counseling and coordinating care related to the above assessment and plan. ? ? ?

## 2022-01-20 NOTE — Progress Notes (Signed)
2000:  Pt resting comfortably at this time awaiting transport to go to [facility]. No complaints voiced.   ?2155:  Pt discharged at this time via stretcher.  Belongings sent with family.   ?

## 2022-01-20 NOTE — Progress Notes (Signed)
Pharmacy Antibiotic Note ? ?Elizabeth Wu is a 71 y.o. female admitted on 01/18/2022, now with  wound infection .  Pharmacy has been consulted for Zosyn  dosing. ? ?Plan: ?Zosyn 3.375g IV q8h (4 hour infusion). ? ?Height: 5\' 4"  (162.6 cm) ?Weight: 57.9 kg (127 lb 10.3 oz) ?IBW/kg (Calculated) : 54.7 ? ?Temp (24hrs), Avg:98.1 ?F (36.7 ?C), Min:97.5 ?F (36.4 ?C), Max:98.9 ?F (37.2 ?C) ? ?Recent Labs  ?Lab 01/18/22 ?0926 01/18/22 ?1218 01/18/22 ?1521 01/19/22 ?0355 01/19/22 ?0745  ?WBC 26.5* 22.6*  --  26.0*  --   ?CREATININE 1.09* 1.08*  --  0.81  --   ?LATICACIDVEN  --   --  1.8  --  1.4  ?  ?Estimated Creatinine Clearance: 55.8 mL/min (by C-G formula based on SCr of 0.81 mg/dL).   ? ?No Known Allergies ? ? ?Thank you for allowing pharmacy to be a part of this patient?s care. ? ?Wynona Neat, PharmD, BCPS  ?01/20/2022 7:14 AM ? ?

## 2022-01-20 NOTE — Progress Notes (Signed)
Patient with right buttock decubitus ulcer, stage IV with necrotic fat at the base.  She is status post debridement on 3/1.  Patient and patient's family have elected for the patient to sign on to inpatient hospice.  No further plans for debridement.  Recommend local wound care with Santyl and wet-to-dry dressing changes twice daily and PRN. ? ?Graciella Freer, DO ?[facility] ?[place], [place] [address] ?[phone] (office) ? ?

## 2022-01-20 NOTE — Discharge Summary (Signed)
Physician Discharge Summary   Patient: CORLISS COGGESHALL MRN: [id number] DOB: Sep 30, 1951  Admit date:     01/18/2022  Discharge date: 01/20/22  Discharge Physician: Deatra James   PCP: [name], DO   Recommendations at discharge:  Follow-up with hospice  Discharge Diagnoses: Principal Problem:   Atrial fibrillation with RVR ([place]) Active Problems:   Decubitus ulcer of sacral region, stage 1   Hyperlipidemia   Hypertension   Malignant neoplasm of upper-inner quadrant of left female breast ([place])   Metastatic primary lung cancer ([place])   Buttock wound, right, initial encounter  Resolved Problems:   Malignant neoplasm of upper-outer quadrant of left female breast [facility])   Hospital Course: GISSEL KEILMAN is a 71 y.o. 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 Recent history of A Fib RVR with ED visit 12/06/21 and admit 12/31/21. She was evaluated by cardiology during her admission. She has been started on po Amiodarone for rate control. She is not a candidate for anticoagulation due to brain mets. Other problems include HTN, HLD, tobacco abuse. By report she has had poor PO intake and has had progressive weakness/inanitation who presented to the cancer center for fluid infusioin. She was noted to have mild hypotension and tachycardia. EKG revealed A. Fib with RVR. She was directly referred to ED for tx and further evaluation.  She was started on IV amiodarone.  Patient referred for observation due to A Fib with RVR and hyperkalemia.    ED Course: T 97.4  101/79  HR 142  RR 15. Abnormal Lab: K 5.2, BUN 40, Cr 1.08, Ca 8.4, WBC 22.6 no diff done, Hgb 10.3, Plts  442.EKG @ 11:21 - A. Fib with RVR, @ 12.28 Sinus tach. In ED patient received 1L bolus NS, bolus IV amiodarone and continuous infusion. TRH called to admit for continued treatment.   Assessment and Plan: * Atrial fibrillation with RVR [facility]) Patient  with h/o PAF s/p ED visit 12/06/21 and admit 12/31/21.  -Currently in A-fib with RVR, and hypotensive Exacerbated by dehydration - In ED received IV bolus amiodarone and infusion with conversion to Sinus Tach.  -IV amiodarone has been switched to to 200 mg PO bid - Start low dose diltiazem 30 mg q 6, hold for SBP < 90 -Cardiology consulted .Marland Kitchen  Appreciate input -Pending 2D echocardiogram  Decubitus ulcer of sacral region, stage 1 RN noticed dark skin change left sacrum when moving the patient. Per patient and family she is in bed a lot and they have noticed the change. On exam : very dark skin 3x7 cm left sacrum, appears superficial  - Wound care consult-recommend surgical evaluation for debridement  Were not sure if patient could tolerate debridement at this time Nevertheless general surgery consulted -appreciate input  Metastatic primary lung cancer ([place]) Bx defined adenocarcinoma lung with bone, skin and brain mets.   Plan Continue treatment per oncology  Malignant neoplasm of upper-inner quadrant of left female breast ([place]) Continue home regimen -Planned for next week for appointment with Tarri Abernethy PAC in the office, -Palliative care consulted  Hypertension Historically with HTN -currently hypotensive -Cardizem on hold due to hypotension -Continuing amiodarone -Appreciate cardiology input    Hyperlipidemia Lipitor on patients med list but she claims this was stopped.  -Continue statin therapy while in-patient    Ethics: Discussed with the patient and patient's family and patient's oncology --patient and family agreed to proceed with hospice  and comfort care DNR/DNI was confirmed.   Patient be discharged to hospice home     Consultants: Cardiology/oncology/palliative care Procedures performed: Echo Disposition: Hospice care Diet recommendation:  Discharge Diet Orders (From admission, onward)     Start     Ordered   01/20/22 0000  Diet - low sodium  heart healthy        01/20/22 1124           Regular diet  DISCHARGE MEDICATION: Allergies as of 01/20/2022   No Known Allergies      Medication List     STOP taking these medications    anastrozole 1 MG tablet Commonly known as: ARIMIDEX   cyanocobalamin 1000 MCG/ML injection Commonly known as: (VITAMIN B-12)   Ensure   Fish Oil 1610 MG Caps   folic acid 1 MG tablet Commonly known as: FOLVITE   IBU 600 MG tablet Generic drug: ibuprofen   megestrol 400 MG/10ML suspension Commonly known as: MEGACE   Misc. Devices Misc   potassium chloride SA 20 MEQ tablet Commonly known as: KLOR-CON M   prochlorperazine 10 MG tablet Commonly known as: COMPAZINE   simvastatin 20 MG tablet Commonly known as: ZOCOR   Vitamin D 50 MCG (2000 UT) tablet       TAKE these medications    amiodarone 200 MG tablet Commonly known as: PACERONE Take 1 tablet (200 mg total) by mouth 2 (two) times daily.   bisacodyl 10 MG suppository Commonly known as: Dulcolax Place 1 suppository (10 mg total) rectally as needed for moderate constipation.   docusate sodium 100 MG capsule Commonly known as: COLACE Take 100 mg by mouth 2 (two) times daily.   glycopyrrolate 1 MG tablet Commonly known as: ROBINUL Take 1 tablet (1 mg total) by mouth every 4 (four) hours as needed (excessive secretions).   metoprolol tartrate 25 MG tablet Commonly known as: LOPRESSOR Take 1 tablet (25 mg total) by mouth 2 (two) times daily.   Oxycodone HCl 10 MG Tabs Take 10 mg by mouth 3 (three) times daily as needed.   oxyCODONE-acetaminophen 10-325 MG tablet Commonly known as: PERCOCET TAKE 1 TABLET EVERY 4 HOURS AS NEEDED FOR PAIN               Discharge Care Instructions  (From admission, onward)           Start     Ordered   01/20/22 0000  Discharge wound care:       Comments: Per surgery/wound care instructions   01/20/22 1124             Discharge Exam: Filed Weights    01/18/22 1124 01/19/22 0500 01/20/22 0500  Weight: 51.7 kg 57 kg 57.9 kg   Patient was seen and examined awake alert oriented no acute distress HEENT within normal limits, lungs clear to auscultation bilaterally with chronic cardiovascular S1-S2 irregularly irregular, A-fib with RVR Musculoskeletal severe generalized weaknesses, able to move all 4 extremities in bed Neuro exam limited sensory intact motor seems to be intact Skin large decubitus to buttocks area dressing in place Extremities: positive pulse bilaterally failure  Condition at discharge: stable  The results of significant diagnostics from this hospitalization (including imaging, microbiology, ancillary and laboratory) are listed below for reference.   Imaging Studies: DG Chest Port 1 View  Result Date: 01/18/2022 CLINICAL DATA:  Atrial fibrillation EXAM: PORTABLE CHEST 1 VIEW COMPARISON:  12/31/2021 FINDINGS: Diffuse reticulonodular interstitial disease likely reflecting a combination of metastatic disease and  underlying emphysematous changes. No new focal consolidation. No pleural effusion or pneumothorax. Heart and mediastinal contours are unremarkable. No acute osseous abnormality. Right-sided Port-A-Cath in satisfactory position. IMPRESSION: 1. Diffuse reticulonodular interstitial disease likely reflecting a combination of metastatic disease and underlying emphysematous changes. 2. No new focal parenchymal opacity suggests pneumonia. Electronically Signed   By: Kathreen Devoid M.D.   On: 01/18/2022 11:54   DG Chest Port 1 View  Result Date: 12/31/2021 CLINICAL DATA:  Tachycardia with weakness.  History of hypertension. EXAM: PORTABLE CHEST 1 VIEW COMPARISON:  Radiographs 12/10/2021 and 12/06/2021.  CT 12/06/2021. FINDINGS: 1012 hours. Left subclavian Port-A-Cath tip projects over the lower SVC, unchanged. The heart size and mediastinal contours are stable. There is aortic atherosclerosis. Extensive reticulonodular densities  throughout both lungs are grossly stable. The dominant cavitary lesion in the right lower lobe on CT projects over the right hilum and is not well visualized. No superimposed airspace disease, pleural effusion or pneumothorax identified. The bones appear unchanged. Telemetry leads overlie the chest. IMPRESSION: Grossly stable radiographic appearance of the chest with diffuse reticulonodular densities corresponding with probable metastatic disease superimposed on emphysema. No apparent acute superimposed process. Electronically Signed   By: Richardean Sale M.D.   On: 12/31/2021 10:29    Microbiology: Results for orders placed or performed during the hospital encounter of 01/18/22  Blood Culture (routine x 2)     Status: None (Preliminary result)   Collection Time: 01/18/22  3:21 PM   Specimen: BLOOD RIGHT ARM  Result Value Ref Range Status   Specimen Description BLOOD RIGHT ARM  Final   Special Requests   Final    BOTTLES DRAWN AEROBIC AND ANAEROBIC Blood Culture adequate volume   Culture   Final    NO GROWTH 2 DAYS Performed at [facility], [address], [place], [place] [id number]    Report Status PENDING  Incomplete  Blood Culture (routine x 2)     Status: None (Preliminary result)   Collection Time: 01/18/22  3:21 PM   Specimen: BLOOD RIGHT ARM  Result Value Ref Range Status   Specimen Description BLOOD RIGHT ARM  Final   Special Requests   Final    BOTTLES DRAWN AEROBIC AND ANAEROBIC Blood Culture adequate volume   Culture   Final    NO GROWTH 2 DAYS Performed at [facility], [address] [place], [place] [address]    Report Status PENDING  Incomplete  MRSA Next Gen by PCR, Nasal     Status: None   Collection Time: 01/18/22 10:45 PM   Specimen: Nasal Mucosa; Nasal Swab  Result Value Ref Range Status   MRSA by PCR Next Gen NOT DETECTED NOT DETECTED Final    Comment: (NOTE) The GeneXpert MRSA Assay (FDA approved for NASAL specimens only), is one component of a comprehensive  MRSA colonization surveillance program. It is not intended to diagnose MRSA infection nor to guide or monitor treatment for MRSA infections. Test performance is not FDA approved in patients less than 68 years old. Performed at [facility], [address] [place], [place] [address]   Resp Panel by RT-PCR (Flu A&B, Covid) Nasopharyngeal Swab     Status: None   Collection Time: 01/18/22 11:06 PM   Specimen: Nasopharyngeal Swab; Nasopharyngeal(NP) swabs in vial transport medium  Result Value Ref Range Status   SARS Coronavirus 2 by RT PCR NEGATIVE NEGATIVE Final    Comment: (NOTE) SARS-CoV-2 target nucleic acids are NOT DETECTED.  The SARS-CoV-2 RNA is generally  detectable in upper respiratory specimens during the acute phase of infection. The lowest concentration of SARS-CoV-2 viral copies this assay can detect is 138 copies/mL. A negative result does not preclude SARS-Cov-2 infection and should not be used as the sole basis for treatment or other patient management decisions. A negative result may occur with  improper specimen collection/handling, submission of specimen other than nasopharyngeal swab, presence of viral mutation(s) within the areas targeted by this assay, and inadequate number of viral copies(<138 copies/mL). A negative result must be combined with clinical observations, patient history, and epidemiological information. The expected result is Negative.  Fact Sheet for Patients:  EntrepreneurPulse.com.au  Fact Sheet for Healthcare Providers:  IncredibleEmployment.be  This test is no t yet approved or cleared by the [place] FDA and  has been authorized for detection and/or diagnosis of SARS-CoV-2 by FDA under an Emergency Use Authorization (EUA). This EUA will remain  in effect (meaning this test can be used) for the duration of the COVID-19 declaration under Section 564(b)(1) of the Act, 21 U.S.C.section 360bbb-3(b)(1),  unless the authorization is terminated  or revoked sooner.       Influenza A by PCR NEGATIVE NEGATIVE Final   Influenza B by PCR NEGATIVE NEGATIVE Final    Comment: (NOTE) The Xpert Xpress SARS-CoV-2/FLU/RSV plus assay is intended as an aid in the diagnosis of influenza from Nasopharyngeal swab specimens and should not be used as a sole basis for treatment. Nasal washings and aspirates are unacceptable for Xpert Xpress SARS-CoV-2/FLU/RSV testing.  Fact Sheet for Patients: EntrepreneurPulse.com.au  Fact Sheet for Healthcare Providers: IncredibleEmployment.be  This test is not yet approved or cleared by the [place] FDA and has been authorized for detection and/or diagnosis of SARS-CoV-2 by FDA under an Emergency Use Authorization (EUA). This EUA will remain in effect (meaning this test can be used) for the duration of the COVID-19 declaration under Section 564(b)(1) of the Act, 21 U.S.C. section 360bbb-3(b)(1), unless the authorization is terminated or revoked.  Performed at [facility], [address], [place], [place] [address]     Labs: CBC: Recent Labs  Lab 01/18/22 [id number] 01/18/22 1218 01/19/22 0355  WBC 26.5* 22.6* 26.0*  NEUTROABS 25.7*  --  21.1*  HGB 11.1* 10.3* 9.0*  HCT 34.4* 33.0* 28.0*  MCV 93.7 94.6 96.6  PLT 496* 442* 967   Basic Metabolic Panel: Recent Labs  Lab 01/18/22 0926 01/18/22 1218 01/19/22 0355  NA 135 135 134*  K 5.2* 5.2* 4.6  CL 101 103 104  CO2 22 21* 20*  GLUCOSE 128* 99 126*  BUN 40* 40* 39*  CREATININE 1.09* 1.08* 0.81  CALCIUM 8.8* 8.4* 7.9*  MG 1.7 1.8  --    Liver Function Tests: Recent Labs  Lab 01/18/22 0926  AST 17  ALT 13  ALKPHOS 137*  BILITOT 1.2  PROT 6.1*  ALBUMIN 2.1*   CBG: No results for input(s): GLUCAP in the last 168 hours.  Discharge time spent: greater than 30 minutes.  Signed: Deatra James, MD [facility] 01/20/2022

## 2022-01-21 LAB — SARS CORONAVIRUS 2 (TAT 6-24 HRS): SARS Coronavirus 2: NEGATIVE

## 2022-01-21 MED FILL — Medication: Qty: 1 | Status: AC

## 2022-01-24 LAB — CULTURE, BLOOD (ROUTINE X 2)
Culture: NO GROWTH
Culture: NO GROWTH
Special Requests: ADEQUATE
Special Requests: ADEQUATE

## 2022-01-26 ENCOUNTER — Ambulatory Visit: Payer: Medicare HMO

## 2022-02-01 ENCOUNTER — Ambulatory Visit (HOSPITAL_COMMUNITY): Payer: Medicare HMO

## 2022-02-01 ENCOUNTER — Ambulatory Visit (HOSPITAL_COMMUNITY): Payer: Medicare HMO | Admitting: Hematology

## 2022-02-01 ENCOUNTER — Other Ambulatory Visit (HOSPITAL_COMMUNITY): Payer: Medicare HMO

## 2022-02-02 ENCOUNTER — Ambulatory Visit (HOSPITAL_COMMUNITY): Payer: Medicare HMO

## 2022-02-19 DEATH — deceased
